# Patient Record
Sex: Female | Born: 1950 | Race: White | Hispanic: No | Marital: Married | Smoking: Former smoker
Health system: Southern US, Community
[De-identification: ages and names within clinical notes are randomized; demographics above are authoritative.]

## PROBLEM LIST (undated history)

## (undated) DIAGNOSIS — H3530 Unspecified macular degeneration: Secondary | ICD-10-CM

## (undated) DIAGNOSIS — N183 Chronic kidney disease, stage 3 unspecified: Secondary | ICD-10-CM

## (undated) DIAGNOSIS — T4145XA Adverse effect of unspecified anesthetic, initial encounter: Secondary | ICD-10-CM

## (undated) DIAGNOSIS — R519 Headache, unspecified: Secondary | ICD-10-CM

## (undated) DIAGNOSIS — R112 Nausea with vomiting, unspecified: Secondary | ICD-10-CM

## (undated) DIAGNOSIS — F32A Depression, unspecified: Secondary | ICD-10-CM

## (undated) DIAGNOSIS — N3281 Overactive bladder: Secondary | ICD-10-CM

## (undated) DIAGNOSIS — F319 Bipolar disorder, unspecified: Secondary | ICD-10-CM

## (undated) DIAGNOSIS — E785 Hyperlipidemia, unspecified: Secondary | ICD-10-CM

## (undated) DIAGNOSIS — I10 Essential (primary) hypertension: Secondary | ICD-10-CM

## (undated) DIAGNOSIS — F419 Anxiety disorder, unspecified: Secondary | ICD-10-CM

## (undated) DIAGNOSIS — Z8719 Personal history of other diseases of the digestive system: Secondary | ICD-10-CM

## (undated) DIAGNOSIS — F329 Major depressive disorder, single episode, unspecified: Secondary | ICD-10-CM

## (undated) DIAGNOSIS — F1910 Other psychoactive substance abuse, uncomplicated: Secondary | ICD-10-CM

## (undated) DIAGNOSIS — K219 Gastro-esophageal reflux disease without esophagitis: Secondary | ICD-10-CM

## (undated) DIAGNOSIS — F4310 Post-traumatic stress disorder, unspecified: Secondary | ICD-10-CM

## (undated) DIAGNOSIS — M1990 Unspecified osteoarthritis, unspecified site: Secondary | ICD-10-CM

## (undated) DIAGNOSIS — T8859XA Other complications of anesthesia, initial encounter: Secondary | ICD-10-CM

## (undated) DIAGNOSIS — Z9889 Other specified postprocedural states: Secondary | ICD-10-CM

## (undated) DIAGNOSIS — R51 Headache: Secondary | ICD-10-CM

## (undated) HISTORY — PX: TONSILLECTOMY AND ADENOIDECTOMY: SUR1326

## (undated) HISTORY — DX: Bipolar disorder, unspecified: F31.9

## (undated) HISTORY — PX: FOOT SURGERY: SHX648

## (undated) HISTORY — DX: Major depressive disorder, single episode, unspecified: F32.9

## (undated) HISTORY — DX: Chronic kidney disease, stage 3 (moderate): N18.3

## (undated) HISTORY — DX: Other psychoactive substance abuse, uncomplicated: F19.10

## (undated) HISTORY — DX: Depression, unspecified: F32.A

## (undated) HISTORY — PX: KNEE SURGERY: SHX244

## (undated) HISTORY — DX: Chronic kidney disease, stage 3 unspecified: N18.30

## (undated) HISTORY — DX: Hyperlipidemia, unspecified: E78.5

## (undated) HISTORY — DX: Overactive bladder: N32.81

## (undated) HISTORY — DX: Anxiety disorder, unspecified: F41.9

## (undated) HISTORY — DX: Unspecified macular degeneration: H35.30

## (undated) HISTORY — DX: Personal history of other diseases of the digestive system: Z87.19

---

## 2007-06-29 ENCOUNTER — Ambulatory Visit: Payer: Self-pay | Admitting: Gastroenterology

## 2011-07-30 ENCOUNTER — Ambulatory Visit: Payer: Self-pay | Admitting: Podiatry

## 2011-09-22 ENCOUNTER — Ambulatory Visit: Payer: Self-pay

## 2012-02-25 ENCOUNTER — Ambulatory Visit: Payer: Self-pay | Admitting: Podiatry

## 2012-08-25 ENCOUNTER — Ambulatory Visit: Payer: Self-pay | Admitting: Family Medicine

## 2012-09-26 ENCOUNTER — Ambulatory Visit: Payer: Self-pay | Admitting: Family Medicine

## 2013-09-27 ENCOUNTER — Ambulatory Visit: Payer: Self-pay | Admitting: Family Medicine

## 2013-10-03 ENCOUNTER — Ambulatory Visit: Payer: Self-pay | Admitting: Family Medicine

## 2013-12-07 ENCOUNTER — Ambulatory Visit: Payer: Self-pay | Admitting: Podiatry

## 2014-09-28 ENCOUNTER — Ambulatory Visit: Payer: Self-pay | Admitting: Family Medicine

## 2014-11-02 IMAGING — MG MM ADDITIONAL VIEWS AT NO CHARGE
2 series · 6 of 6 positions shown · non-contrast
Comparison: Priors

CLINICAL DATA: Patient recalled from screening for possible left
breast architectural distortion

EXAM:
DIGITAL DIAGNOSTIC  LEFT MAMMOGRAM WITH CAD

[L CC · left · 4 of 4 slices shown]
[im 1/4]
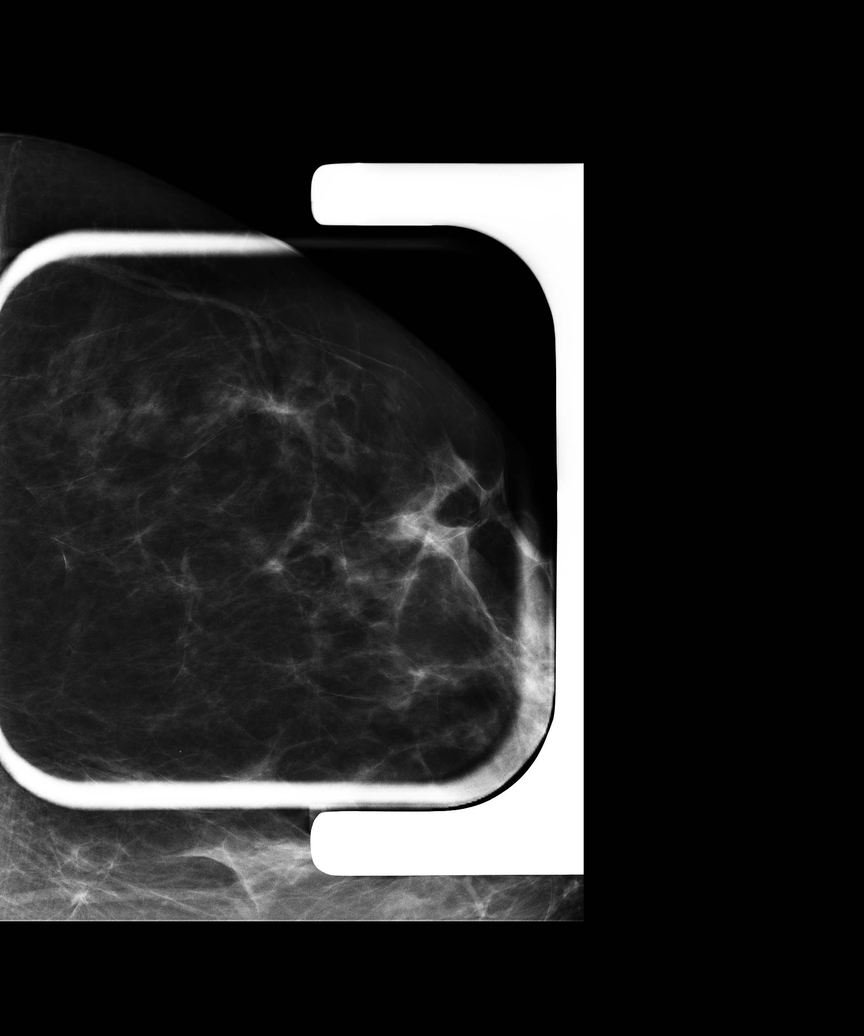
[im 2/4]
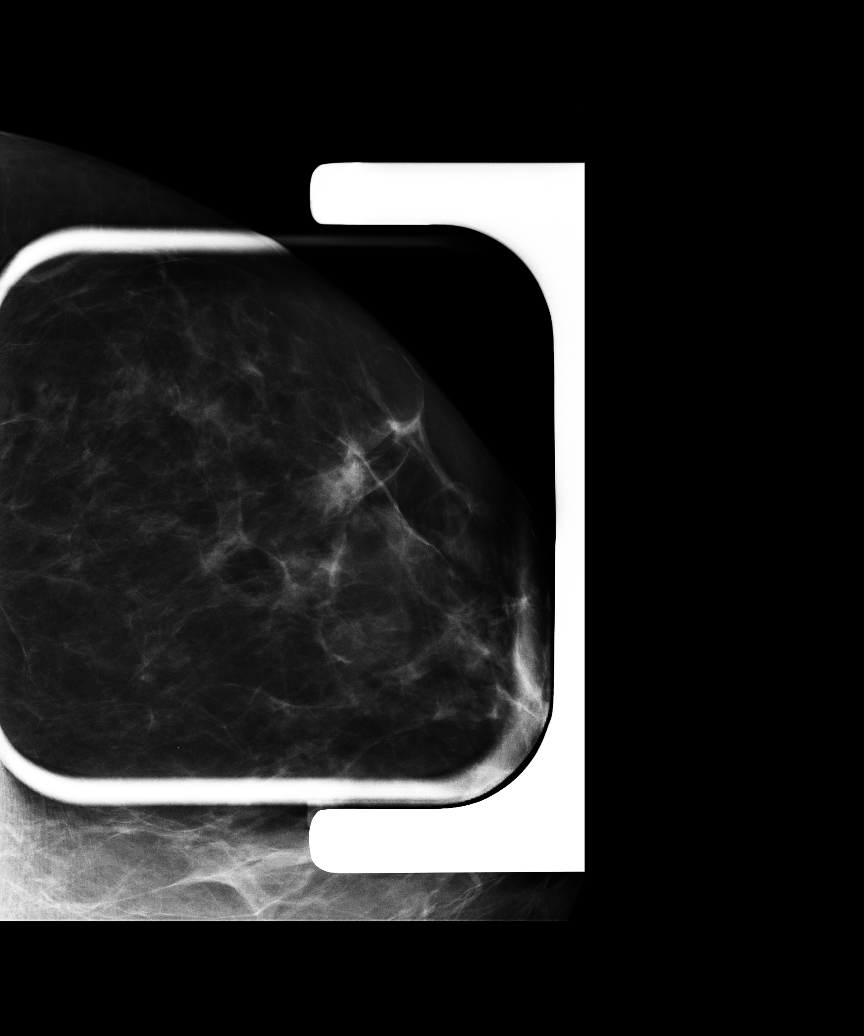
[im 3/4]
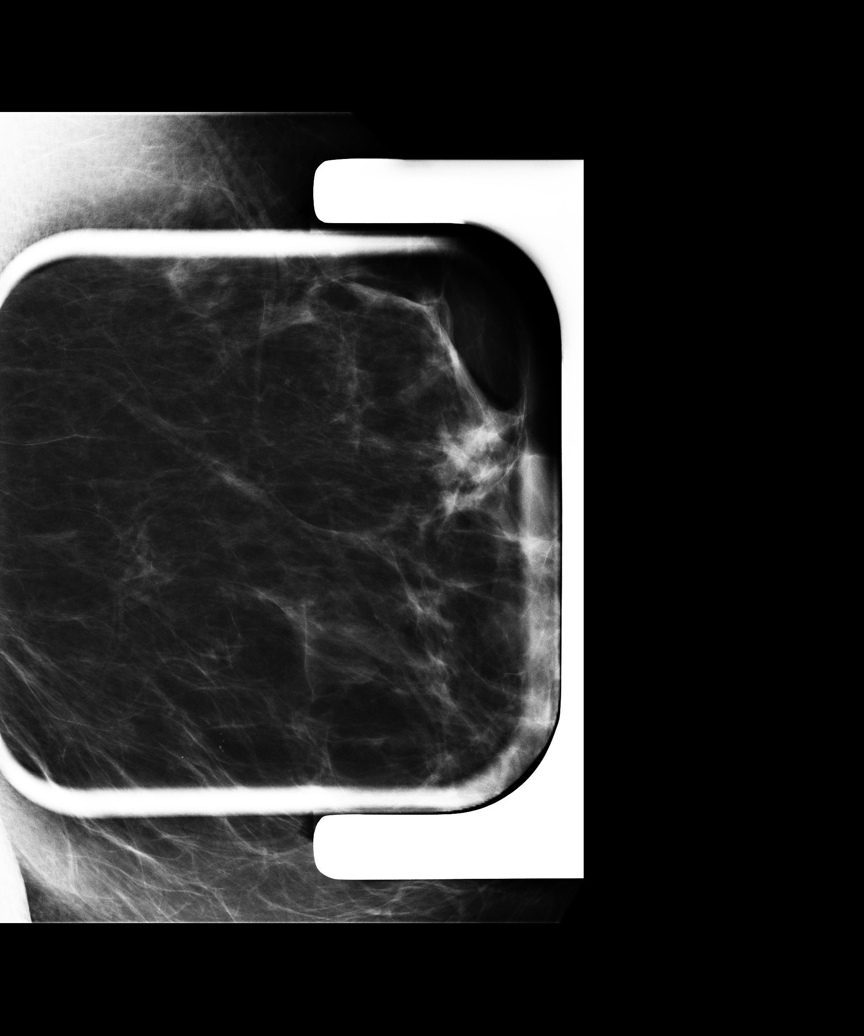
[im 4/4]
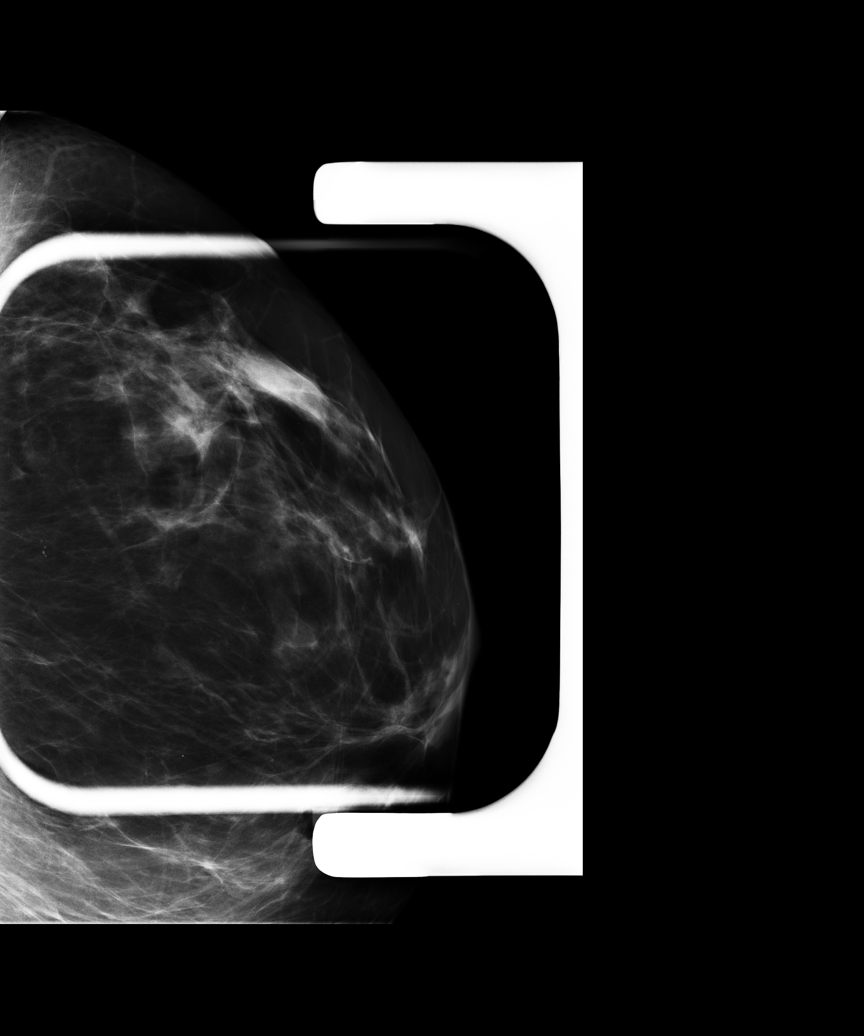

[L MLO · left · 2 of 2 slices shown]
[im 1/2]
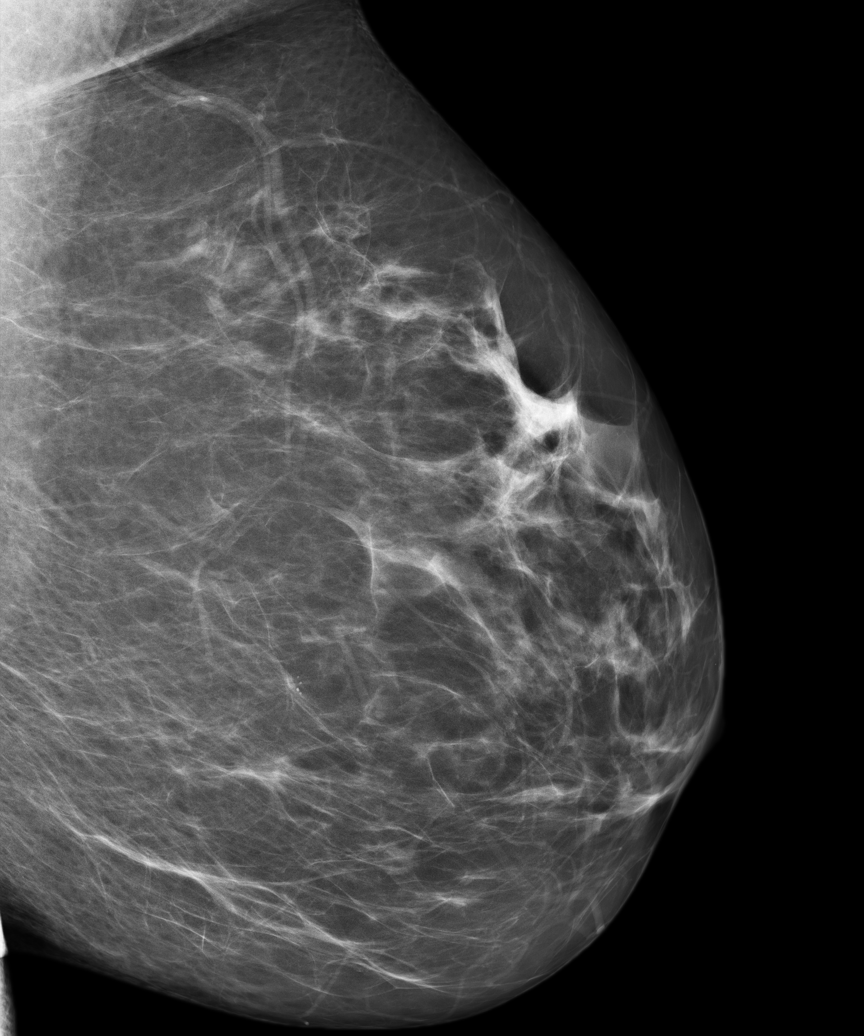
[im 2/2]
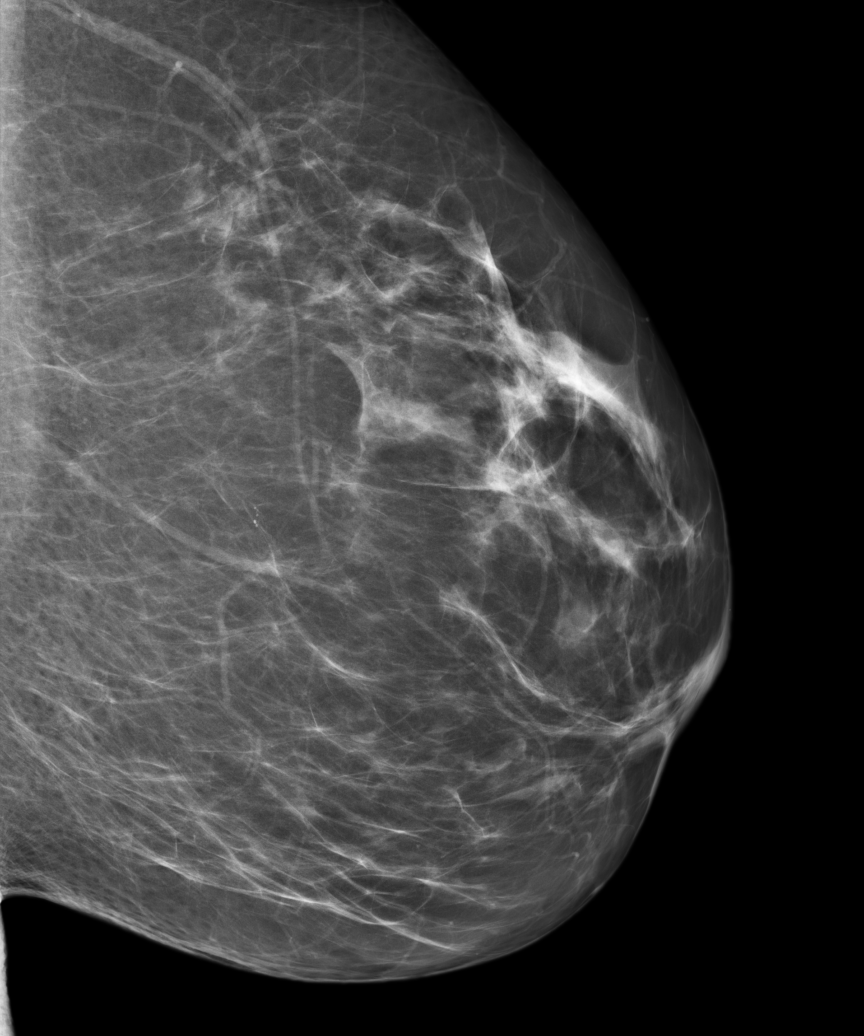

[6 of 6 positions shown; findings below may reference images not displayed]

ACR Breast Density Category c: The breast tissue is heterogeneously
dense, which may obscure small masses.
FINDINGS: Repeat left MLO and true lateral views as well as spot compression
left breast CC and MLO views were obtained. Questioned architectural
distortion resolved with additional imaging compatible with
overlapping fibroglandular breast tissue.

Mammographic images were processed with CAD.
IMPRESSION: No mammographic evidence for malignancy

RECOMMENDATION:
Screening mammogram in one year.(Code:[id number])

I have discussed the findings and recommendations with the patient.
Results were also provided in writing at the conclusion of the
visit. If applicable, a reminder letter will be sent to the patient
regarding the next appointment.

BI-RADS CATEGORY  1: Negative.

## 2015-02-13 ENCOUNTER — Other Ambulatory Visit: Payer: Self-pay | Admitting: Family Medicine

## 2015-02-13 ENCOUNTER — Other Ambulatory Visit: Payer: Self-pay

## 2015-02-13 DIAGNOSIS — R944 Abnormal results of kidney function studies: Secondary | ICD-10-CM

## 2015-02-13 LAB — BASIC METABOLIC PANEL
BUN: 13 mg/dL (ref 4–21)
CREATININE: 1 mg/dL (ref 0.5–1.1)
GLUCOSE: 108 mg/dL
Potassium: 4.8 mmol/L (ref 3.4–5.3)
Sodium: 139 mmol/L (ref 137–147)

## 2015-02-14 ENCOUNTER — Telehealth: Payer: Self-pay | Admitting: Family Medicine

## 2015-02-14 ENCOUNTER — Encounter: Payer: Self-pay | Admitting: Family Medicine

## 2015-02-14 DIAGNOSIS — N183 Chronic kidney disease, stage 3 unspecified: Secondary | ICD-10-CM

## 2015-02-14 NOTE — Telephone Encounter (Signed)
Please let patient know that her kidney functions improved, but still are not normal. Given her decreased kidney function, we are going to refer her to the kidney doctors. No new medicines at this time, we will just watch, keep her BP under good control and have her see the kidney doctor.

## 2015-02-14 NOTE — Telephone Encounter (Signed)
I called the patient, no answer will try again.

## 2015-02-15 NOTE — Telephone Encounter (Signed)
Forwarded to [facility]

## 2015-02-15 NOTE — Telephone Encounter (Signed)
Call pt to review

## 2015-02-15 NOTE — Telephone Encounter (Signed)
Discussed will keep nephrology apt and reassured pt

## 2015-02-15 NOTE — Telephone Encounter (Signed)
Patient notified of message from Dr. Wynetta Emery. Patient would like to know what Dr. Jeananne Rama thinks of the results, and what he thinks that she should do. Will forward this to [place] for [facility].

## 2015-03-21 ENCOUNTER — Telehealth: Payer: Self-pay | Admitting: Family Medicine

## 2015-03-21 NOTE — Telephone Encounter (Signed)
Pt came in and brought a from that needs a signature saying she can go on a hike July 13th and says she normally sees dr Jeananne Rama but has also seen dr Wynetta Emery. i was unsure who needed to sign the paper but i have it up front.

## 2015-03-21 NOTE — Telephone Encounter (Signed)
OK to hike. Form filled out. To be scanned in.

## 2015-03-21 NOTE — Telephone Encounter (Signed)
Last CPE: 08/02/14 Last Visit: 02/01/15 Problem List: osteopenia  DIVERTICULOSIS  HYPERTENSION  HYPERLIPIDEMIA  BIPOLAR    alcoholic  DEPRESSION Thyroid Issues Anxiety Overactive bladder  Current Medications:  [facility]  Rx: ASPIRIN daily  Rx: CALCIUM daily  Rx: FISH OIL daily  Rx: GABAPENTIN 300mg  1 Tablet daily  Rx: MIRTAZAPINE 45MG  1 Tablet at bedtime  Rx: ATORVASTATIN CALCIUM 20MG  1 Tablet daily  Rx: BENAZEPRIL HCL 40MG  1 Tablet daily  Rx: HYDROXYZ PAM 25mg  1 Capsule twice daily  Rx: SEROQUEL 400mg  1 Tablet daily  Rx: BUSPIRONE HCL 15MG  Tablet      Instructions: TAKE 1 TABLET 3 TIMES A DAY Rx: TOPAMAX 1 Tablet daily

## 2015-06-13 ENCOUNTER — Telehealth: Payer: Self-pay | Admitting: Family Medicine

## 2015-06-13 MED ORDER — CLONAZEPAM 1 MG PO TABS: 1.0000 mg | ORAL_TABLET | Freq: Every day | ORAL | Status: DC | PRN

## 2015-06-13 NOTE — Telephone Encounter (Signed)
Forward to provider

## 2015-06-13 NOTE — Telephone Encounter (Signed)
Pt called stated she is stressed to the max, has trees on her house, has to stay in a hotel with several women. Would like a RX for Klonopin or something similar. [facility] [place]. Pt stated she just needs enough to get her through (30 day supply) requested. Pt has A LOT going on, didn't think she was going to survive the past week then the trees fell on her house. Thanks.

## 2015-07-09 ENCOUNTER — Ambulatory Visit
Admission: RE | Admit: 2015-07-09 | Discharge: 2015-07-09 | Disposition: A | Discharge status: Home / Self Care | Payer: BC Managed Care – PPO | Source: Ambulatory Visit | Attending: Psychiatry | Admitting: Psychiatry

## 2015-07-09 ENCOUNTER — Ambulatory Visit (INDEPENDENT_AMBULATORY_CARE_PROVIDER_SITE_OTHER): Payer: BC Managed Care – PPO | Admitting: Psychiatry

## 2015-07-09 ENCOUNTER — Encounter
Admission: RE | Admit: 2015-07-09 | Discharge: 2015-07-09 | Disposition: A | Discharge status: Home / Self Care | Payer: BC Managed Care – PPO | Source: Ambulatory Visit | Attending: Psychiatry | Admitting: Psychiatry

## 2015-07-09 ENCOUNTER — Encounter: Payer: Self-pay | Admitting: Psychiatry

## 2015-07-09 VITALS — BP 126/88 | HR 78 | Temp 97.8°F | Ht 66.0 in | Wt 182.8 lb

## 2015-07-09 DIAGNOSIS — F3130 Bipolar disorder, current episode depressed, mild or moderate severity, unspecified: Secondary | ICD-10-CM | POA: Diagnosis not present

## 2015-07-09 DIAGNOSIS — Z01810 Encounter for preprocedural cardiovascular examination: Secondary | ICD-10-CM | POA: Diagnosis not present

## 2015-07-09 DIAGNOSIS — I10 Essential (primary) hypertension: Secondary | ICD-10-CM | POA: Diagnosis not present

## 2015-07-09 DIAGNOSIS — F319 Bipolar disorder, unspecified: Secondary | ICD-10-CM | POA: Insufficient documentation

## 2015-07-09 DIAGNOSIS — Z419 Encounter for procedure for purposes other than remedying health state, unspecified: Secondary | ICD-10-CM

## 2015-07-09 HISTORY — DX: Adverse effect of unspecified anesthetic, initial encounter: T41.45XA

## 2015-07-09 HISTORY — DX: Other specified postprocedural states: Z98.890

## 2015-07-09 HISTORY — DX: Nausea with vomiting, unspecified: R11.2

## 2015-07-09 HISTORY — DX: Other complications of anesthesia, initial encounter: T88.59XA

## 2015-07-09 HISTORY — DX: Essential (primary) hypertension: I10

## 2015-07-09 LAB — URINALYSIS COMPLETE WITH MICROSCOPIC (ARMC ONLY)
Bilirubin Urine: NEGATIVE
GLUCOSE, UA: NEGATIVE mg/dL
KETONES UR: NEGATIVE mg/dL
NITRITE: NEGATIVE
Protein, ur: NEGATIVE mg/dL
SPECIFIC GRAVITY, URINE: 1.004 — AB (ref 1.005–1.030)
pH: 7 (ref 5.0–8.0)

## 2015-07-09 LAB — CBC
HCT: 44.8 % (ref 35.0–47.0)
Hemoglobin: 14.8 g/dL (ref 12.0–16.0)
MCH: 30.5 pg (ref 26.0–34.0)
MCHC: 33.1 g/dL (ref 32.0–36.0)
MCV: 92.2 fL (ref 80.0–100.0)
PLATELETS: 186 10*3/uL (ref 150–440)
RBC: 4.85 MIL/uL (ref 3.80–5.20)
RDW: 12.9 % (ref 11.5–14.5)
WBC: 8.5 10*3/uL (ref 3.6–11.0)

## 2015-07-09 LAB — COMPREHENSIVE METABOLIC PANEL
ALT: 18 U/L (ref 14–54)
ANION GAP: 8 (ref 5–15)
AST: 20 U/L (ref 15–41)
Albumin: 4.5 g/dL (ref 3.5–5.0)
Alkaline Phosphatase: 74 U/L (ref 38–126)
BUN: 15 mg/dL (ref 6–20)
CALCIUM: 10.2 mg/dL (ref 8.9–10.3)
CHLORIDE: 103 mmol/L (ref 101–111)
CO2: 29 mmol/L (ref 22–32)
Creatinine, Ser: 0.92 mg/dL (ref 0.44–1.00)
Glucose, Bld: 93 mg/dL (ref 65–99)
Potassium: 4.7 mmol/L (ref 3.5–5.1)
SODIUM: 140 mmol/L (ref 135–145)
Total Bilirubin: 0.8 mg/dL (ref 0.3–1.2)
Total Protein: 7.6 g/dL (ref 6.5–8.1)

## 2015-07-09 NOTE — Progress Notes (Signed)
[facility] MD Progress Note  07/09/2015 8:14 PM ELISHEBA MCDONNELL  MRN:  [id number] Subjective:  This is a consult for a patient for consideration of ECT.  64 year old woman referred by her outpatient psychiatrist. Information was obtained from direct interview with the patient as well as review of some referral note sent over by Dr. Vallarie Mare. Reports that she has a history of bipolar disorder primarily with depression. Most recent symptoms started over the summer. She states that she had a period of time in which she was feeling slightly euphoric and Dr. Vallarie Mare asked her to start cutting down on her Remeron dose. Patient soon after became extremely depressed in August and it has just gotten worse. Mood is down and depressed all the time. Frequent crying spells. Lack of enjoyment in anything. Waking up frequently at night. Appetite has been increased. Patient has been having suicide thoughts and told her psychiatrist about them. Guns were removed from the house and locked up. She is still had some thoughts about cutting herself but has not acted on it. She feels restrained in this by her love of her pets in her family. Agent is not reporting any hallucinations or psychotic symptoms. She is currently being treated with a combination of Seroquel, buspirone, mirtazapine and has recently been started on lithium and Depakote by her primary psychiatrist and takes a low dose of clonazepam. Her outpatient psychiatrist has referred her to consider ECT. Patient denies that she is abusing any alcohol or drugs currently.  Past psychiatric history: Patient has a history of long-standing bipolar disorder. Nevertheless she apparently has never had a psychotic mania. She does report some behavior in the late summer in which she may have had sex with some strangers while intoxicated. She feels particularly guilty about this now. She has had a history of suicide attempts in the 1990s with a threatened hanging and self cutting. Multiple  medications have been tried.  Social history: Patient is married. Lives with her husband. Has 2 adult children. She tries to stay busy with volunteer work and work around [employer] but has had trouble with that recently.  Family history: She reports several suicides or suicide attempts in first or second degree relatives. Fairly widespread depression and suicidality. Denies that she has any history of substance abuse.  Patient denies history of drug abuse although she says she drinks about 3 times a week she denies that has been a problem for her.  Medical history: History of high blood pressure. She has had multiple surgeries for minor orthopedic problems and says she had trouble with agitation on one occasion but no other complications from anesthesia. No history of myocardial infarction or stroke. Principal Problem: @PPROB @ Diagnosis:   Patient Active Problem List   Diagnosis Date Noted  . CKD (chronic kidney disease) stage 3, GFR 30-59 ml/min [N18.3]    Total Time spent with patient: 1 hour  Past Psychiatric History: Long-standing diagnosis of bipolar disorder with mostly severe depression although some nonpsychotic manias. Previous hospitalization several years ago. Multiple medications have been tried she states that Seroquel had been helped for a long period of time. Antidepressants themselves rarely had been helpful over the long-term.  Past Medical History:  Past Medical History  Diagnosis Date  . CKD (chronic kidney disease) stage 3, GFR 30-59 ml/min   . Anxiety   . Depression   . Bipolar disorder ([place])   . Thyroid disease     Past Surgical History  Procedure Laterality Date  . Foot  surgery Bilateral   . Knee surgery Left   . Tonsillectomy and adenoidectomy     Family History:  Family History  Problem Relation Age of Onset  . Alzheimer's disease Mother   . Bipolar disorder Mother   . Depression Mother   . Heart attack Father   . Diabetes Brother   . Obesity Brother    . Depression Maternal Grandfather   . Depression Daughter    Family Psychiatric  History: Family history positive for multiple people with depression and a few people who have committed suicide. Social History:  History  Alcohol Use  . 1.8 oz/week  . 0 Standard drinks or equivalent, 0 Glasses of wine, 0 Cans of beer, 3 Shots of liquor per week     History  Drug Use No    Social History   Social History  . Marital Status: Married    Spouse Name: N/A  . Number of Children: N/A  . Years of Education: N/A   Social History Main Topics  . Smoking status: Former Smoker    Types: Cigarettes    Start date: 07/09/1967    Quit date: 07/08/1985  . Smokeless tobacco: Never Used  . Alcohol Use: 1.8 oz/week    0 Standard drinks or equivalent, 0 Glasses of wine, 0 Cans of beer, 3 Shots of liquor per week  . Drug Use: No  . Sexual Activity: Not Currently   Other Topics Concern  . None   Social History Narrative   Additional Social History:                         Sleep: Poor  Appetite:  Good  Current Medications: Current Outpatient Prescriptions  Medication Sig Dispense Refill  . aspirin EC 81 MG tablet Take by mouth.    . benazepril (LOTENSIN) 40 MG tablet Take by mouth.    . benazepril (LOTENSIN) 5 MG tablet Take 5 mg by mouth daily.  3  . busPIRone (BUSPAR) 15 MG tablet Take by mouth.    . clonazePAM (KLONOPIN) 1 MG tablet Take 1 tablet (1 mg total) by mouth daily as needed for anxiety. 30 tablet 0  . divalproex (DEPAKOTE) 500 MG DR tablet TAKE ONE TABLET BY MOUTH AT BEDTIME X 2 DAYS, THEN 1 TABLET BY MOUTH TWICE A DAY  0  . FLUARIX QUADRIVALENT 0.5 ML injection TO BE ADMINISTERED BY PHARMACIST FOR IMMUNIZATION  0  . gabapentin (NEURONTIN) 100 MG capsule     . Influenza Vac Typ A&B Surf Ant (FLUVIRIN) SUSP     . lithium 300 MG tablet Take 300 mg by mouth 2 (two) times daily.  1  . mirtazapine (REMERON) 30 MG tablet Take 30 mg by mouth at bedtime.  3  . Omega-3  Fatty Acids (FISH OIL) 1000 MG CAPS Take by mouth.    . QUEtiapine (SEROQUEL) 100 MG tablet Take 100 mg by mouth at bedtime.  0  . QUEtiapine (SEROQUEL) 300 MG tablet Take by mouth.     No current facility-administered medications for this visit.    Lab Results: No results found for this or any previous visit (from the past 48 hour(s)).  Physical Findings: AIMS:  , ,  ,  ,    CIWA:    COWS:     Musculoskeletal: Strength & Muscle Tone: within normal limits Gait & Station: normal Patient leans: N/A  Psychiatric Specialty Exam: ROS  Blood pressure 126/88, pulse 78, temperature 97.8 F (  36.6 C), temperature source Tympanic, height 5\' 6"  (1.676 m), weight 182 lb 12.8 oz (82.918 kg), SpO2 96 %.Body mass index is 29.52 kg/(m^2).  General Appearance: Casual  Eye Contact::  Good  Speech:  Normal Rate  Volume:  Decreased  Mood:  Depressed  Affect:  Depressed and Tearful  Thought Process:  Coherent  Orientation:  Full (Time, Place, and Person)  Thought Content:  Negative  Suicidal Thoughts:  Yes.  without intent/plan  Homicidal Thoughts:  No  Memory:  Immediate;   Good Recent;   Good Remote;   Good  Judgement:  Fair  Insight:  Good  Psychomotor Activity:  Decreased  Concentration:  Fair  Recall:  [employer] of Knowledge:Fair  Language: Fair  Akathisia:  No  Handed:  Right  AIMS (if indicated):     Assets:  Communication Skills Desire for Improvement Financial Resources/Insurance Housing Intimacy Leisure Time Physical Health Resilience Social Support Talents/Skills Transportation  ADL's:  Intact  Cognition: WNL  Sleep:      Treatment Plan Summary: Plan This is a 64 year old woman with history of bipolar disorder probably more likely bipolar type II who is currently presenting with symptoms of severe 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 She is having suicidal ideation and becoming more and more on functional in unstable at home. She has been on multiple  medications and is not currently responding. Her outpatient psychiatrist is additionally concerned because he is planning to close his practice soon and is worried about trying to get her well as soon as possible. Patient meets criteria for electroconvulsive therapy based on her diagnosis, failure to respond to appropriate medication for her depression and bipolar disorder and willingness to engage in treatment. She has no medical contraindications to treatment. Patient reports that she has appropriate transportation available.We spent some time discussing the practical aspects of ECT treatment. Patient was given opportunity to ask as many questions that she wanted. She is agreeable to ECT. I would recommend beginning right leg unilateral ECT as soon as we can get lab studies done and get her on the schedule. I have advised her that she needs to stop taking her valproic acid and might as well go ahead and do that now. I also suggested she should probably cut down on her gabapentin. I am on the fence about her lithium. I think that may increase the risk of delirium with ECT but it also seems to be of some likelihood to help stabilizing her suicidality. All things considered I'm advising her to leave it as it is.Patient was given an order form for pre-surgical testing. Labs including CBC chemistry panel urine analysis chest x-ray and EKG done. I have left a message for the ECT office to expect her call. Patient know she can get in touch with me at any time if she has other questions. I will leave a message for her outpatient psychiatrist as well.  Dorris Vangorder 07/09/2015, 8:14 PM

## 2015-07-11 NOTE — OR Nursing (Signed)
Abnormal ua called and faxed to Dr Weber Cooks office

## 2015-07-11 NOTE — Patient Instructions (Signed)
  Your procedure is scheduled on: Report to Day Surgery. To find out your arrival time please call [phone] between 1PM - 3PM on .  Remember: Instructions that are not followed completely may result in serious medical risk, up to and including death, or upon the discretion of your surgeon and anesthesiologist your surgery may need to be rescheduled.    x___ 1. Do not eat food or drink liquids after midnight. No gum chewing or hard candies.     _x___ 2. No Alcohol for 24 hours before or after surgery.   ____ 3. Bring all medications with you on the day of surgery if instructed.    __x__ 4. Notify your doctor if there is any change in your medical condition     (cold, fever, infections).     Do not wear jewelry, make-up, hairpins, clips or nail polish.  Do not wear lotions, powders, or perfumes. You may wear deodorant.  Do not shave 48 hours prior to surgery. Men may shave face and neck.  Do not bring valuables to the hospital.    [facility] is not responsible for any belongings or valuables.               Contacts, dentures or bridgework may not be worn into surgery.  Leave your suitcase in the car. After surgery it may be brought to your room.  For patients admitted to the hospital, discharge time is determined by your                treatment team.   Patients discharged the day of surgery will not be allowed to drive home.   Please read over the following fact sheets that you were given:   Surgical Site Infection Prevention   ____ Take these medicines the morning of surgery with A SIP OF WATER:    1. Take as instructed by Dr Algie Coffer  2.   3.   4.  5.  6.  ____ Fleet Enema (as directed)   ____ Use CHG Soap as directed  ____ Use inhalers on the day of surgery  ____ Stop metformin 2 days prior to surgery    ____ Take 1/2 of usual insulin dose the night before surgery and none on the morning of surgery.   ____ Stop Coumadin/Plavix/aspirin on   ____ Stop  Anti-inflammatories on    ____ Stop supplements until after surgery.    ____ Bring C-Pap to the hospital.

## 2015-07-12 ENCOUNTER — Telehealth: Payer: Self-pay | Admitting: Psychiatry

## 2015-07-12 ENCOUNTER — Telehealth (HOSPITAL_COMMUNITY): Payer: Self-pay | Admitting: *Deleted

## 2015-07-12 NOTE — Telephone Encounter (Signed)
Was going to do prior authorization for ECT treatments but noticed a note dated 10-28 that patient called and wants to hold off starting ECT at this time. Will consult with MD on when to have authorization with insurance complete.

## 2015-07-12 NOTE — Telephone Encounter (Signed)
I don't know, I had not seen the note saying she was holding off. Last I heard she was planning to do it and she did get all of her labs done. I'll look at the chart if it looks like she is holding off then we certainly don't need to bother with the prior authorization.

## 2015-07-12 NOTE — Telephone Encounter (Signed)
Okay. If she doesn't want to follow-up right now that's fine. I will let Allie no we don't need to do the prior authorization either.

## 2015-07-22 ENCOUNTER — Ambulatory Visit: Payer: Self-pay | Admitting: Psychiatry

## 2015-08-05 ENCOUNTER — Encounter: Payer: Self-pay | Admitting: Family Medicine

## 2015-08-05 ENCOUNTER — Ambulatory Visit (INDEPENDENT_AMBULATORY_CARE_PROVIDER_SITE_OTHER): Payer: BC Managed Care – PPO | Admitting: Family Medicine

## 2015-08-05 VITALS — BP 121/82 | HR 78 | Temp 98.6°F | Ht 65.2 in | Wt 182.0 lb

## 2015-08-05 DIAGNOSIS — E7800 Pure hypercholesterolemia, unspecified: Secondary | ICD-10-CM | POA: Insufficient documentation

## 2015-08-05 DIAGNOSIS — E039 Hypothyroidism, unspecified: Secondary | ICD-10-CM | POA: Diagnosis not present

## 2015-08-05 DIAGNOSIS — I10 Essential (primary) hypertension: Secondary | ICD-10-CM

## 2015-08-05 DIAGNOSIS — F319 Bipolar disorder, unspecified: Secondary | ICD-10-CM | POA: Diagnosis not present

## 2015-08-05 MED ORDER — ATORVASTATIN CALCIUM 20 MG PO TABS: 20.0000 mg | ORAL_TABLET | Freq: Every day | ORAL | Status: DC

## 2015-08-05 MED ORDER — BUSPIRONE HCL 15 MG PO TABS: 15.0000 mg | ORAL_TABLET | Freq: Three times a day (TID) | ORAL | Status: DC

## 2015-08-05 MED ORDER — BENAZEPRIL HCL 5 MG PO TABS: 5.0000 mg | ORAL_TABLET | Freq: Every day | ORAL | Status: DC

## 2015-08-05 NOTE — Progress Notes (Signed)
BP 121/82 mmHg  Pulse 78  Temp(Src) 98.6 F (37 C)  Ht 5' 5.2" (1.656 m)  Wt 182 lb (82.555 kg)  BMI 30.10 kg/m2  SpO2 99%   Subjective:    Patient ID: Elizabeth Malone, female    DOB: Jul 15, 1951, 64 y.o.   MRN: [id number]  HPI: Elizabeth Malone is a 64 y.o. female  Chief Complaint  Patient presents with  . Annual Exam   Patient doing well working with psychiatry on chronic depression bipolar. Requesting routine labs plus lithium level Otherwise patient doing well concerned about possibility of some enlarged glands in her neck Patient is lost serious weight and kidney doctor reduced into Prill from 40 mg to 5 mg and blood pressure continues to do well. Relevant past medical, surgical, family and social history reviewed and updated as indicated. Interim medical history since our last visit reviewed. Allergies and medications reviewed and updated.  Review of Systems  Constitutional: Negative.   HENT: Negative.   Eyes: Negative.   Respiratory: Negative.   Cardiovascular: Negative.   Gastrointestinal: Negative.   Endocrine: Negative.   Genitourinary: Negative.   Musculoskeletal: Negative.   Skin: Negative.   Allergic/Immunologic: Negative.   Neurological: Negative.   Hematological: Negative.   Psychiatric/Behavioral: Negative.     Per HPI unless specifically indicated above     Objective:    BP 121/82 mmHg  Pulse 78  Temp(Src) 98.6 F (37 C)  Ht 5' 5.2" (1.656 m)  Wt 182 lb (82.555 kg)  BMI 30.10 kg/m2  SpO2 99%  Wt Readings from Last 3 Encounters:  08/05/15 182 lb (82.555 kg)  02/01/15 207 lb (93.895 kg)  07/09/15 182 lb 12.8 oz (82.918 kg)    Physical Exam  Constitutional: She is oriented to person, place, and time. She appears well-developed and well-nourished.  HENT:  Head: Normocephalic and atraumatic.  Right Ear: External ear normal.  Left Ear: External ear normal.  Nose: Nose normal.  Mouth/Throat: Oropharynx is clear and moist.  Eyes: Conjunctivae  and EOM are normal. Pupils are equal, round, and reactive to light.  Neck: Normal range of motion. Neck supple. Carotid bruit is not present.  Cardiovascular: Normal rate, regular rhythm and normal heart sounds.   No murmur heard. Pulmonary/Chest: Effort normal and breath sounds normal. She exhibits no mass. Right breast exhibits no mass, no skin change and no tenderness. Left breast exhibits no mass, no skin change and no tenderness. Breasts are symmetrical.  Abdominal: Soft. Bowel sounds are normal. There is no hepatosplenomegaly.  Musculoskeletal: Normal range of motion.  Neurological: She is alert and oriented to person, place, and time.  Skin: No rash noted.  Psychiatric: She has a normal mood and affect. Her behavior is normal. Judgment and thought content normal.    Results for orders placed or performed during the hospital encounter of 07/09/15  CBC  Result Value Ref Range   WBC 8.5 3.6 - 11.0 K/uL   RBC 4.85 3.80 - 5.20 MIL/uL   Hemoglobin 14.8 12.0 - 16.0 g/dL   HCT 44.8 35.0 - 47.0 %   MCV 92.2 80.0 - 100.0 fL   MCH 30.5 26.0 - 34.0 pg   MCHC 33.1 32.0 - 36.0 g/dL   RDW 12.9 11.5 - 14.5 %   Platelets 186 150 - 440 K/uL  Comprehensive metabolic panel  Result Value Ref Range   Sodium 140 135 - 145 mmol/L   Potassium 4.7 3.5 - 5.1 mmol/L   Chloride 103 101 -  111 mmol/L   CO2 29 22 - 32 mmol/L   Glucose, Bld 93 65 - 99 mg/dL   BUN 15 6 - 20 mg/dL   Creatinine, Ser 0.92 0.44 - 1.00 mg/dL   Calcium 10.2 8.9 - 10.3 mg/dL   Total Protein 7.6 6.5 - 8.1 g/dL   Albumin 4.5 3.5 - 5.0 g/dL   AST 20 15 - 41 U/L   ALT 18 14 - 54 U/L   Alkaline Phosphatase 74 38 - 126 U/L   Total Bilirubin 0.8 0.3 - 1.2 mg/dL   GFR calc non Af Amer >60 >60 mL/min   GFR calc Af Amer >60 >60 mL/min   Anion gap 8 5 - 15  Urinalysis complete, with microscopic ([facility] only)  Result Value Ref Range   Color, Urine STRAW (A) YELLOW   APPearance HAZY (A) CLEAR   Glucose, UA NEGATIVE NEGATIVE mg/dL    Bilirubin Urine NEGATIVE NEGATIVE   Ketones, ur NEGATIVE NEGATIVE mg/dL   Specific Gravity, Urine 1.004 (L) 1.005 - 1.030   Hgb urine dipstick 1+ (A) NEGATIVE   pH 7.0 5.0 - 8.0   Protein, ur NEGATIVE NEGATIVE mg/dL   Nitrite NEGATIVE NEGATIVE   Leukocytes, UA 3+ (A) NEGATIVE   RBC / HPF 0-5 0 - 5 RBC/hpf   WBC, UA TOO NUMEROUS TO COUNT 0 - 5 WBC/hpf   Bacteria, UA FEW (A) NONE SEEN   Squamous Epithelial / LPF 0-5 (A) NONE SEEN   WBC Clumps PRESENT       Assessment & Plan:   Problem List Items Addressed This Visit      Cardiovascular and Mediastinum   Essential hypertension    The current medical regimen is effective;  continue present plan and medications.       Relevant Medications   benazepril (LOTENSIN) 5 MG tablet   atorvastatin (LIPITOR) 20 MG tablet     Endocrine   Hypothyroidism    No meds         Other   Hypercholesteremia    The current medical regimen is effective;  continue present plan and medications.       Relevant Medications   benazepril (LOTENSIN) 5 MG tablet   atorvastatin (LIPITOR) 20 MG tablet   Bipolar disorder (H[facility]) - Primary    Followed by psychiatry          Follow up plan: Return in about 6 months (around 02/02/2016) for BMP, lipid panel, ALT, AST.

## 2015-08-05 NOTE — Assessment & Plan Note (Signed)
The current medical regimen is effective;  continue present plan and medications.  

## 2015-08-05 NOTE — Assessment & Plan Note (Addendum)
No meds

## 2015-08-05 NOTE — Assessment & Plan Note (Signed)
Followed by psychiatry 

## 2015-08-06 ENCOUNTER — Encounter: Payer: Self-pay | Admitting: Family Medicine

## 2015-08-06 ENCOUNTER — Telehealth: Payer: Self-pay | Admitting: Family Medicine

## 2015-08-06 LAB — TSH: TSH: 2.16 u[IU]/mL (ref 0.450–4.500)

## 2015-08-06 LAB — LITHIUM LEVEL: LITHIUM LVL: 1.1 mmol/L (ref 0.6–1.4)

## 2015-08-06 MED ORDER — METOPROLOL SUCCINATE ER 25 MG PO TB24: 25.0000 mg | ORAL_TABLET | Freq: Every day | ORAL | Status: DC

## 2015-08-06 NOTE — Telephone Encounter (Signed)
Pt stated Dr Geryl Councilman is going to stop practicing and he would like to have pt start a beta blocker to reduce headaches. Send to [facility]

## 2015-08-07 ENCOUNTER — Telehealth: Payer: Self-pay

## 2015-08-07 MED ORDER — METOPROLOL SUCCINATE ER 25 MG PO TB24: 25.0000 mg | ORAL_TABLET | Freq: Every day | ORAL | Status: DC

## 2015-08-07 NOTE — Telephone Encounter (Signed)
[facility] requesting 90 day Rx for Metoprolol ER TAB 25mg 

## 2015-08-12 ENCOUNTER — Encounter: Payer: Self-pay | Admitting: Psychiatry

## 2015-08-12 ENCOUNTER — Ambulatory Visit (INDEPENDENT_AMBULATORY_CARE_PROVIDER_SITE_OTHER): Payer: BC Managed Care – PPO | Admitting: Psychiatry

## 2015-08-12 ENCOUNTER — Telehealth: Payer: Self-pay

## 2015-08-12 VITALS — BP 124/80 | HR 80 | Temp 98.4°F | Ht 65.2 in | Wt 188.4 lb

## 2015-08-12 DIAGNOSIS — F3130 Bipolar disorder, current episode depressed, mild or moderate severity, unspecified: Secondary | ICD-10-CM

## 2015-08-12 MED ORDER — LITHIUM CARBONATE 300 MG PO TABS: 300.0000 mg | ORAL_TABLET | Freq: Three times a day (TID) | ORAL | Status: DC

## 2015-08-12 MED ORDER — QUETIAPINE FUMARATE 100 MG PO TABS: 100.0000 mg | ORAL_TABLET | Freq: Every day | ORAL | Status: DC

## 2015-08-12 MED ORDER — MIRTAZAPINE 30 MG PO TABS: 30.0000 mg | ORAL_TABLET | Freq: Every day | ORAL | Status: DC

## 2015-08-12 MED ORDER — QUETIAPINE FUMARATE 100 MG PO TABS: 400.0000 mg | ORAL_TABLET | Freq: Every day | ORAL | Status: DC

## 2015-08-12 MED ORDER — BUSPIRONE HCL 15 MG PO TABS: 15.0000 mg | ORAL_TABLET | Freq: Three times a day (TID) | ORAL | Status: DC

## 2015-08-12 NOTE — Telephone Encounter (Signed)
Elizabeth Malone at [facility] called ref # [id number] [phone]) called because they received two different rx for seroquel and they need to be clear on what you want the patient to take.  both rx for seroquel 100mg  but one has take one daily  and then the other one is for seroquel 100mg  take 4 but it only for 90 table not enough.  If you were going to do a total of 500mg  you can use a 300mg  and a 200mg  to equal 500mg .  Please call them and clear up rx instructions.  Use the phone number  [phone] and ref#  [id number].

## 2015-08-12 NOTE — Progress Notes (Signed)
[facility] MD Progress Note  08/12/2015 1:11 PM Elizabeth Malone  MRN:  [id number] Subjective:    64 year old woman seen  for follow-up. She was previously evaluated by Dr. Weber Cooks for ECT but patient reported that she has improved since her medications have been adjusted. Patient reported that she is currently doing well on her medications. She appeared somewhat hyper during the interview and reported that she went shopping yesterday and spent almost $100 on clothing. She reported that she enjoys shopping. She is anxious as her current psychiatrist Dr. Vallarie Mare is retiring and reported that he has been prescribing her Seroquel 500 mg at night as well as lithium carbonate 300 mg in the morning and 600 at bedtime. She recently had her lithium level done at her PCP office and she brought the paperwork with her. Her lithium level is 1.1 at this time. She reported that she takes her medications as prescribed and is not experiencing any adverse effects of the medications. Patient stated that when she takes higher doses of the medication she started kicking and starts having anxiety symptoms related to the lithium dose. Patient reported that her anxiety is controlled with the help of buspirone. She stated that Dr. Vallarie Mare have advised her that her mirtazapine does need to be decreased and she feels that she does not want to lower the dose of the medication at this time. She feels that her relationship is good with her family members at this time and she is enjoying the holidays. She appeared somewhat hyper during the interview but reported that this is her baseline and all her medications are controlled  She stated that she is very excited that she is able to find a new psychiatrist as she was very anxious with Dr. Lianne Moris departure. She denied having any suicidal homicidal ideations or plans. She denied having any perceptual disturbances she denied having any thoughts to hurt herself.    Past psychiatric history: Patient has a  history of long-standing bipolar disorder. Nevertheless she apparently has never had a psychotic mania. She does report some behavior in the late summer in which she may have had sex with some strangers while intoxicated. She feels particularly guilty about this now. She has had a history of suicide attempts in the 1990s with a threatened hanging and self cutting. Multiple medications have been tried.  Social history: Patient is married. Lives with her husband. Has 2 adult children. She tries to stay busy with volunteer work and work around [employer] but has had trouble with that recently.  Family history: She reports several suicides or suicide attempts in first or second degree relatives. Fairly widespread depression and suicidality. Denies that she has any history of substance abuse.  Patient denies history of drug abuse although she says she drinks about 3 times a week she denies that has been a problem for her.  Medical history: History of high blood pressure. She has had multiple surgeries for minor orthopedic problems and says she had trouble with agitation on one occasion but no other complications from anesthesia. No history of myocardial infarction or stroke. Principal Problem:  Diagnosis:   Patient Active Problem List   Diagnosis Date Noted  . Bipolar disorder ([place]) [F31.9] 08/05/2015  . Essential hypertension [I10] 08/05/2015  . Hypothyroidism [E03.9] 08/05/2015  . Hypercholesteremia [E78.00] 08/05/2015  . CKD (chronic kidney disease) stage 3, GFR 30-59 ml/min [N18.3]    Total Time spent with patient: 30 mins     Past Medical History:  Past  Medical History  Diagnosis Date  . CKD (chronic kidney disease) stage 3, GFR 30-59 ml/min   . Anxiety   . Depression   . Bipolar disorder ([place])   . Complication of anesthesia   . PONV (postoperative nausea and vomiting)   . Substance abuse     alcohol  . History of diverticulosis   . Overactive bladder     Past Surgical History   Procedure Laterality Date  . Foot surgery Bilateral   . Knee surgery Left   . Tonsillectomy and adenoidectomy     Family History:  Family History  Problem Relation Age of Onset  . Alzheimer's disease Mother   . Bipolar disorder Mother   . Depression Mother   . Heart attack Father   . Diabetes Brother   . Obesity Brother   . Depression Maternal Grandfather   . Depression Daughter    Family Psychiatric  History: Family history positive for multiple people with depression and a few people who have committed suicide. Social History:  History  Alcohol Use  . 1.8 oz/week  . 0 Glasses of wine, 0 Cans of beer, 3 Shots of liquor, 0 Standard drinks or equivalent per week     History  Drug Use No    Social History   Social History  . Marital Status: Married    Spouse Name: N/A  . Number of Children: N/A  . Years of Education: N/A   Social History Main Topics  . Smoking status: Former Smoker    Types: Cigarettes    Start date: 07/09/1967    Quit date: 07/08/1985  . Smokeless tobacco: Never Used  . Alcohol Use: 1.8 oz/week    0 Glasses of wine, 0 Cans of beer, 3 Shots of liquor, 0 Standard drinks or equivalent per week  . Drug Use: No  . Sexual Activity: Not Currently   Other Topics Concern  . None   Social History Narrative   Additional Social History:                         Sleep: Poor  Appetite:  Good  Current Medications: Current Outpatient Prescriptions  Medication Sig Dispense Refill  . aspirin EC 81 MG tablet Take by mouth.    Marland Kitchen atorvastatin (LIPITOR) 20 MG tablet Take 1 tablet (20 mg total) by mouth daily at 6 PM. 90 tablet 4  . benazepril (LOTENSIN) 5 MG tablet Take 1 tablet (5 mg total) by mouth daily. 90 tablet 4  . busPIRone (BUSPAR) 15 MG tablet Take 1 tablet (15 mg total) by mouth 3 (three) times daily. 270 tablet 4  . lithium 300 MG tablet Take 300 mg by mouth 3 (three) times daily.   1  . mirtazapine (REMERON) 30 MG tablet Take 30 mg  by mouth at bedtime.  3  . Omega-3 Fatty Acids (FISH OIL) 1000 MG CAPS Take by mouth.    . QUEtiapine (SEROQUEL) 100 MG tablet Take 500 mg by mouth.   0  . metoprolol succinate (TOPROL-XL) 25 MG 24 hr tablet Take 1 tablet (25 mg total) by mouth daily. (Patient not taking: Reported on 08/12/2015) 90 tablet 4   No current facility-administered medications for this visit.    Lab Results: No results found for this or any previous visit (from the past 48 hour(s)).  Physical Findings: AIMS:  , ,  ,  ,    CIWA:    COWS:     Musculoskeletal:  Strength & Muscle Tone: within normal limits Gait & Station: normal Patient leans: N/A  Psychiatric Specialty Exam: ROS   Blood pressure 124/80, pulse 80, temperature 98.4 F (36.9 C), temperature source Tympanic, height 5' 5.2" (1.656 m), weight 188 lb 6.4 oz (85.458 kg), SpO2 95 %.Body mass index is 31.16 kg/(m^2).  General Appearance: Casual  Eye Contact::  Good  Speech:  Normal Rate  Volume:  Normal  Mood:  Anxious and Dysphoric  Affect:  Depressed and Tearful  Thought Process:  Coherent  Orientation:  Full (Time, Place, and Person)  Thought Content:  WDL  Suicidal Thoughts:  No  Homicidal Thoughts:  No  Memory:  Immediate;   Good Recent;   Good Remote;   Good  Judgement:  Fair  Insight:  Good  Psychomotor Activity:  Normal  Concentration:  Fair  Recall:  [employer] of Knowledge:Fair  Language: Fair  Akathisia:  No  Handed:  Right  AIMS (if indicated):     Assets:  Communication Skills Desire for Improvement Financial Resources/Insurance Housing Intimacy Leisure Time [place] Talents/Skills Transportation  ADL's:  Intact  Cognition: WNL  Sleep:      Treatment Plan Summary:  Discussed with patient at length about the medications treatment risks benefits and alternatives  Mood swings She will continue on Seroquel 500 mg on a daily basis. She will also continue on lithium 300 mg in  the morning and 600 mg at bedtime Currently the Li  level is 1.1  Depression She is currently on Remeron 30 mg at bedtime  Anxiety She will  continue on buspirone 15 mg by mouth 3 times a day  All the medications were sent to the mail order pharmacy  She will follow-up in 6 weeks   More than 50% of the time spent in psychoeducation, counseling and coordination of care.    This note was generated in part or whole with voice recognition software. Voice regonition is usually quite accurate but there are transcription errors that can and very often do occur. I apologize for any typographical errors that were not detected and corrected.     Rainey Pines, MD  08/12/2015, 1:11 PM  HPI

## 2015-08-13 ENCOUNTER — Other Ambulatory Visit: Payer: Self-pay

## 2015-08-13 DIAGNOSIS — Z1239 Encounter for other screening for malignant neoplasm of breast: Secondary | ICD-10-CM

## 2015-08-13 NOTE — Telephone Encounter (Signed)
Discussed with Janett Billow. Her total dose is 500mg  at night.

## 2015-08-13 NOTE — Telephone Encounter (Signed)
per dr. Gretel Acre call in a 100mg  one at bedtime and call in a 400mg  one at bedtime pt to take a total of 500mg  . pharmacy was called and given instructions

## 2015-09-19 ENCOUNTER — Other Ambulatory Visit: Payer: Self-pay

## 2015-09-19 NOTE — Telephone Encounter (Signed)
received a request for seroquel 90 day supply pt has appt on  09-25-14 last seen on  08-12-15.

## 2015-09-23 ENCOUNTER — Ambulatory Visit: Payer: BC Managed Care – PPO | Admitting: Psychiatry

## 2015-09-24 MED ORDER — QUETIAPINE FUMARATE 100 MG PO TABS: 400.0000 mg | ORAL_TABLET | Freq: Every day | ORAL | Status: DC

## 2015-09-26 ENCOUNTER — Encounter: Payer: Self-pay | Admitting: Psychiatry

## 2015-09-26 ENCOUNTER — Ambulatory Visit (INDEPENDENT_AMBULATORY_CARE_PROVIDER_SITE_OTHER): Payer: BC Managed Care – PPO | Admitting: Psychiatry

## 2015-09-26 VITALS — BP 122/88 | HR 80 | Temp 97.7°F | Ht 65.2 in | Wt 195.4 lb

## 2015-09-26 DIAGNOSIS — F3130 Bipolar disorder, current episode depressed, mild or moderate severity, unspecified: Secondary | ICD-10-CM

## 2015-09-26 MED ORDER — GABAPENTIN 300 MG PO CAPS: 300.0000 mg | ORAL_CAPSULE | Freq: Two times a day (BID) | ORAL | Status: DC

## 2015-09-26 MED ORDER — QUETIAPINE FUMARATE 400 MG PO TABS: 400.0000 mg | ORAL_TABLET | Freq: Every day | ORAL | Status: DC

## 2015-09-26 MED ORDER — BUSPIRONE HCL 15 MG PO TABS: 15.0000 mg | ORAL_TABLET | Freq: Three times a day (TID) | ORAL | Status: DC

## 2015-09-26 MED ORDER — LITHIUM CARBONATE 300 MG PO TABS: 300.0000 mg | ORAL_TABLET | Freq: Three times a day (TID) | ORAL | Status: DC

## 2015-09-26 MED ORDER — MIRTAZAPINE 15 MG PO TABS: 30.0000 mg | ORAL_TABLET | Freq: Every day | ORAL | Status: DC

## 2015-09-26 NOTE — Progress Notes (Signed)
[facility] MD Progress Note  09/26/2015 11:10 AM Elizabeth Malone  MRN:  [id number] Subjective:    Patient is a 65 year-old woman seen  for follow-up.She appeared somewhat anxious and hyper during the interview. She reported that the holidays were very long. She reported that she is concerned about the relationship issues with her husband as he is jealous type person. She reported that she has started taking gabapentin 300 mg twice daily to control her anxiety. She has also decrease the dose of Seroquel to 400 mg. She reported that she used to have jerking movements on the higher dose of Seroquel. She stated that she is trying to control her mood symptoms. She has gained a lot of weight on the current combination of the medication and is concerned about her weight gain. Patient stated that she wants to start taking the Topamax but currently she is on the combination of lithium and Seroquel Remeron and gabapentin. She reported that she feels stable. She stated that she wants to have her medications adjusted and wants to stop taking the lithium. However she continues to have mood swings during the day and appears anxious and hyper due to the relationship issues with her husband. She reported that she was following with Dr. Bridgett Larsson in the past who has given her Topamax but he stopped prescribing her medication and told her that it might affect her memory.She appeared somewhat hyper during the interview but reported that this is her baseline and all her medications are controlled   Past psychiatric history: Patient has a history of long-standing bipolar disorder. Nevertheless she apparently has never had a psychotic mania. She does report some behavior in the late summer in which she may have had sex with some strangers while intoxicated. She feels particularly guilty about this now. She has had a history of suicide attempts in the 1990s when she  threatened hanging and self cutting. Multiple medications have been  tried.  Social history: Patient is married. Lives with her husband. Has 2 adult children. She tries to stay busy with volunteer work and work around [employer] but has had trouble with that recently.  Family history: She reports several suicides or suicide attempts in first or second degree relatives. Fairly widespread depression and suicidality. Denies that she has any history of substance abuse.  Patient denies history of drug abuse although she says she drinks about 3 times a week she denies that has been a problem for her.  Medical history: History of high blood pressure. She has had multiple surgeries for minor orthopedic problems and says she had trouble with agitation on one occasion but no other complications from anesthesia. No history of myocardial infarction or stroke. Principal Problem:  Diagnosis:   Patient Active Problem List   Diagnosis Date Noted  . Bipolar disorder ([place]) [F31.9] 08/05/2015  . Essential hypertension [I10] 08/05/2015  . Hypothyroidism [E03.9] 08/05/2015  . Hypercholesteremia [E78.00] 08/05/2015  . CKD (chronic kidney disease) stage 3, GFR 30-59 ml/min [N18.3]    Total Time spent with patient: 30 mins     Past Medical History:  Past Medical History  Diagnosis Date  . CKD (chronic kidney disease) stage 3, GFR 30-59 ml/min   . Anxiety   . Depression   . Bipolar disorder ([place])   . Complication of anesthesia   . PONV (postoperative nausea and vomiting)   . Substance abuse     alcohol  . History of diverticulosis   . Overactive bladder  Past Surgical History  Procedure Laterality Date  . Foot surgery Bilateral   . Knee surgery Left   . Tonsillectomy and adenoidectomy     Family History:  Family History  Problem Relation Age of Onset  . Alzheimer's disease Mother   . Bipolar disorder Mother   . Depression Mother   . Heart attack Father   . Diabetes Brother   . Obesity Brother   . Depression Maternal Grandfather   . Depression Daughter     Family Psychiatric  History: Family history positive for multiple people with depression and a few people who have committed suicide. Social History:  History  Alcohol Use  . 1.8 oz/week  . 0 Glasses of wine, 0 Cans of beer, 3 Shots of liquor, 0 Standard drinks or equivalent per week     History  Drug Use No    Social History   Social History  . Marital Status: Married    Spouse Name: N/A  . Number of Children: N/A  . Years of Education: N/A   Social History Main Topics  . Smoking status: Former Smoker    Types: Cigarettes    Start date: 07/09/1967    Quit date: 07/08/1985  . Smokeless tobacco: Never Used  . Alcohol Use: 1.8 oz/week    0 Glasses of wine, 0 Cans of beer, 3 Shots of liquor, 0 Standard drinks or equivalent per week  . Drug Use: No  . Sexual Activity: Not Currently   Other Topics Concern  . None   Social History Narrative   Additional Social History:                         Sleep: Poor  Appetite:  Good  Current Medications: Current Outpatient Prescriptions  Medication Sig Dispense Refill  . aspirin EC 81 MG tablet Take by mouth.    Marland Kitchen atorvastatin (LIPITOR) 20 MG tablet Take 1 tablet (20 mg total) by mouth daily at 6 PM. 90 tablet 4  . benazepril (LOTENSIN) 5 MG tablet Take 1 tablet (5 mg total) by mouth daily. 90 tablet 4  . busPIRone (BUSPAR) 15 MG tablet Take 1 tablet (15 mg total) by mouth 3 (three) times daily. 270 tablet 4  . gabapentin (NEURONTIN) 100 MG capsule Take 100 mg by mouth 3 (three) times daily.    . hydrOXYzine (VISTARIL) 25 MG capsule Take 25 mg by mouth 2 (two) times daily before a meal.    . lithium 300 MG tablet Take 1 tablet (300 mg total) by mouth 3 (three) times daily. 270 tablet 1  . metoprolol succinate (TOPROL-XL) 25 MG 24 hr tablet Take 1 tablet (25 mg total) by mouth daily. 90 tablet 4  . mirtazapine (REMERON) 30 MG tablet Take 1 tablet (30 mg total) by mouth at bedtime. 90 tablet 3  . Omega-3 Fatty Acids  (FISH OIL) 1000 MG CAPS Take by mouth.    . QUEtiapine (SEROQUEL) 400 MG tablet Take 400 mg by mouth at bedtime.     No current facility-administered medications for this visit.    Lab Results: No results found for this or any previous visit (from the past 48 hour(s)).  Physical Findings: AIMS:  , ,  ,  ,    CIWA:    COWS:     Musculoskeletal: Strength & Muscle Tone: within normal limits Gait & Station: normal Patient leans: N/A  Psychiatric Specialty Exam: ROS   Blood pressure 122/88, pulse 80, temperature  97.7 F (36.5 C), temperature source Tympanic, height 5' 5.2" (1.656 m), weight 195 lb 6.4 oz (88.633 kg), SpO2 97 %.Body mass index is 32.32 kg/(m^2).  General Appearance: Casual  Eye Contact::  Good  Speech:  Normal Rate  Volume:  Normal  Mood:  Anxious and Dysphoric  Affect:  Congruent  Thought Process:  Coherent  Orientation:  Full (Time, Place, and Person)  Thought Content:  WDL  Suicidal Thoughts:  No  Homicidal Thoughts:  No  Memory:  Immediate;   Good Recent;   Good Remote;   Good  Judgement:  Fair  Insight:  Good  Psychomotor Activity:  Normal  Concentration:  Fair  Recall:  [employer] of Knowledge:Fair  Language: Fair  Akathisia:  No  Handed:  Right  AIMS (if indicated):     Assets:  Communication Skills Desire for Improvement Financial Resources/Insurance Housing Intimacy Leisure Time [place] Talents/Skills Transportation  ADL's:  Intact  Cognition: WNL  Sleep:      Treatment Plan Summary:  Discussed with patient at length about the medications treatment risks benefits and alternatives  Mood swings She will continue on Seroquel 400 mg on a daily basis. She will also continue on lithium 300 mg in the morning and 600 mg at bedtime   Depression She is currently on Remeron 15  mg at bedtime and the dose was decreased to control her mood swings  Anxiety She will  continue on buspirone 15 mg by mouth  3 times a day  All the medications were sent to the mail order pharmacy- [facility]  She will follow-up in 2 months.    More than 50% of the time spent in psychoeducation, counseling and coordination of care.    This note was generated in part or whole with voice recognition software. Voice regonition is usually quite accurate but there are transcription errors that can and very often do occur. I apologize for any typographical errors that were not detected and corrected.     Rainey Pines, MD  09/26/2015, 11:10 AM  HPI

## 2015-09-27 ENCOUNTER — Telehealth: Payer: Self-pay | Admitting: Family Medicine

## 2015-09-27 MED ORDER — BENAZEPRIL HCL 5 MG PO TABS: 5.0000 mg | ORAL_TABLET | Freq: Every day | ORAL | Status: DC

## 2015-09-27 NOTE — Telephone Encounter (Signed)
Forward to provider

## 2015-09-27 NOTE — Telephone Encounter (Signed)
Pt needs refill for benazapril sent [facility]. [facility] sent it to [facility] instead.

## 2015-09-27 NOTE — Telephone Encounter (Signed)
Rx sent to her pharmacy 

## 2015-09-30 ENCOUNTER — Ambulatory Visit
Admission: RE | Admit: 2015-09-30 | Discharge: 2015-09-30 | Disposition: A | Discharge status: Home / Self Care | Payer: BC Managed Care – PPO | Source: Ambulatory Visit | Attending: Family Medicine | Admitting: Family Medicine

## 2015-09-30 ENCOUNTER — Other Ambulatory Visit: Payer: Self-pay | Admitting: Family Medicine

## 2015-09-30 DIAGNOSIS — Z1239 Encounter for other screening for malignant neoplasm of breast: Secondary | ICD-10-CM

## 2015-09-30 DIAGNOSIS — R928 Other abnormal and inconclusive findings on diagnostic imaging of breast: Secondary | ICD-10-CM

## 2015-09-30 DIAGNOSIS — Z1231 Encounter for screening mammogram for malignant neoplasm of breast: Secondary | ICD-10-CM | POA: Diagnosis not present

## 2015-10-17 ENCOUNTER — Ambulatory Visit
Admission: RE | Admit: 2015-10-17 | Discharge: 2015-10-17 | Disposition: A | Discharge status: Home / Self Care | Payer: BC Managed Care – PPO | Source: Ambulatory Visit | Attending: Family Medicine | Admitting: Family Medicine

## 2015-10-17 DIAGNOSIS — R928 Other abnormal and inconclusive findings on diagnostic imaging of breast: Secondary | ICD-10-CM | POA: Insufficient documentation

## 2015-11-15 NOTE — Progress Notes (Signed)
Dose changed

## 2015-11-21 ENCOUNTER — Encounter: Payer: Self-pay | Admitting: Psychiatry

## 2015-11-21 ENCOUNTER — Ambulatory Visit (INDEPENDENT_AMBULATORY_CARE_PROVIDER_SITE_OTHER): Payer: BC Managed Care – PPO | Admitting: Psychiatry

## 2015-11-21 VITALS — BP 122/86 | HR 79 | Temp 98.4°F | Ht 65.2 in | Wt 202.2 lb

## 2015-11-21 DIAGNOSIS — F3130 Bipolar disorder, current episode depressed, mild or moderate severity, unspecified: Secondary | ICD-10-CM

## 2015-11-21 MED ORDER — GABAPENTIN 300 MG PO CAPS: 300.0000 mg | ORAL_CAPSULE | Freq: Two times a day (BID) | ORAL | Status: DC

## 2015-11-21 MED ORDER — QUETIAPINE FUMARATE 400 MG PO TABS: 400.0000 mg | ORAL_TABLET | Freq: Every day | ORAL | Status: DC

## 2015-11-21 MED ORDER — LITHIUM CARBONATE 300 MG PO TABS: 300.0000 mg | ORAL_TABLET | Freq: Three times a day (TID) | ORAL | Status: DC

## 2015-11-21 MED ORDER — BUSPIRONE HCL 15 MG PO TABS: 15.0000 mg | ORAL_TABLET | Freq: Two times a day (BID) | ORAL | Status: DC

## 2015-11-21 MED ORDER — TOPIRAMATE 50 MG PO TABS: 50.0000 mg | ORAL_TABLET | Freq: Every day | ORAL | Status: DC

## 2015-11-21 NOTE — Progress Notes (Signed)
[facility] MD Progress Note  11/21/2015 11:36 AM Elizabeth Malone  MRN:  [id number] Subjective:    Patient is a 65 year-old woman seen  for follow-up.She appeared somewhat anxious and and apprehensive during the interview. She reports that she is coming back from her walking trip as she has started walking on Thursday mornings with her group of friends. She remained focus on her weight loss. She reported that she was getting Topamax from Dr. Bridgett Larsson and stopped her medications in October. She stated that she does not remember the reason why the medication was stopped. She reported that it really helped her with the weight loss. She wants to restart the medication again. She stated that the Seroquel and Remeron is causing her to gain weight. Patient stated that she has been taking her other medications as prescribed. She feels that the medications are helping with her mood symptoms and she takes Seroquel regularly at bedtime. She has been taking BuSpar to help with her anxiety. Patient reported that she does not have any jerking movements at this time. Patient currently denied having any mood swings and reported that she sleeps well at night. She reported that she was feeling depressed during the fall but now her depression has improved significantly. She reported that she takes lithium regularly and it has been helping with her symptoms as well. She currently denied having any suicidal homicidal ideations or plans. She denied having any perceptual disturbances.    Principal Problem:  Diagnosis:   Patient Active Problem List   Diagnosis Date Noted  . Bipolar disorder ([place]) [F31.9] 08/05/2015  . Essential hypertension [I10] 08/05/2015  . Hypothyroidism [E03.9] 08/05/2015  . Hypercholesteremia [E78.00] 08/05/2015  . CKD (chronic kidney disease) stage 3, GFR 30-59 ml/min [N18.3]     Past Medical History:  Past Medical History  Diagnosis Date  . CKD (chronic kidney disease) stage 3, GFR 30-59 ml/min   . Anxiety    . Depression   . Bipolar disorder ([place])   . Complication of anesthesia   . PONV (postoperative nausea and vomiting)   . Substance abuse     alcohol  . History of diverticulosis   . Overactive bladder     Past Surgical History  Procedure Laterality Date  . Foot surgery Bilateral   . Knee surgery Left   . Tonsillectomy and adenoidectomy     Family History:  Family History  Problem Relation Age of Onset  . Alzheimer's disease Mother   . Bipolar disorder Mother   . Depression Mother   . Heart attack Father   . Diabetes Brother   . Obesity Brother   . Depression Maternal Grandfather   . Depression Daughter   . Breast cancer Neg Hx    Family Psychiatric  History: Family history positive for multiple people with depression and a few people who have committed suicide. Social History:  History  Alcohol Use  . 1.8 oz/week  . 0 Glasses of wine, 0 Cans of beer, 3 Shots of liquor, 0 Standard drinks or equivalent per week     History  Drug Use No    Social History   Social History  . Marital Status: Married    Spouse Name: N/A  . Number of Children: N/A  . Years of Education: N/A   Social History Main Topics  . Smoking status: Former Smoker    Types: Cigarettes    Start date: 07/09/1967    Quit date: 07/08/1985  . Smokeless tobacco: Never Used  .  Alcohol Use: 1.8 oz/week    0 Glasses of wine, 0 Cans of beer, 3 Shots of liquor, 0 Standard drinks or equivalent per week  . Drug Use: No  . Sexual Activity: Not Currently   Other Topics Concern  . None   Social History Narrative   Additional Social History:                         Sleep: Poor  Appetite:  Good  Current Medications: Current Outpatient Prescriptions  Medication Sig Dispense Refill  . aspirin EC 81 MG tablet Take by mouth.    Marland Kitchen atorvastatin (LIPITOR) 20 MG tablet Take 1 tablet (20 mg total) by mouth daily at 6 PM. 90 tablet 4  . benazepril (LOTENSIN) 5 MG tablet Take 1 tablet (5 mg  total) by mouth daily. 90 tablet 4  . busPIRone (BUSPAR) 15 MG tablet Take 1 tablet (15 mg total) by mouth 3 (three) times daily. 270 tablet 4  . gabapentin (NEURONTIN) 300 MG capsule Take 1 capsule (300 mg total) by mouth 2 (two) times daily. 180 capsule 2  . lithium 300 MG tablet Take 1 tablet (300 mg total) by mouth 3 (three) times daily. 270 tablet 1  . metoprolol succinate (TOPROL-XL) 25 MG 24 hr tablet Take 1 tablet (25 mg total) by mouth daily. 90 tablet 4  . Omega-3 Fatty Acids (FISH OIL) 1000 MG CAPS Take by mouth.    . QUEtiapine (SEROQUEL) 400 MG tablet Take 1 tablet (400 mg total) by mouth at bedtime. 90 tablet 2   No current facility-administered medications for this visit.    Lab Results: No results found for this or any previous visit (from the past 48 hour(s)).  Physical Findings: AIMS:  , ,  ,  ,    CIWA:    COWS:     Musculoskeletal: Strength & Muscle Tone: within normal limits Gait & Station: normal Patient leans: N/A  Psychiatric Specialty Exam: ROS   Blood pressure 122/86, pulse 79, temperature 98.4 F (36.9 C), temperature source Tympanic, height 5' 5.2" (1.656 m), weight 202 lb 3.2 oz (91.717 kg), SpO2 97 %.Body mass index is 33.44 kg/(m^2).  General Appearance: Casual  Eye Contact::  Good  Speech:  Normal Rate  Volume:  Normal  Mood:  Anxious and Dysphoric  Affect:  Congruent  Thought Process:  Coherent  Orientation:  Full (Time, Place, and Person)  Thought Content:  WDL  Suicidal Thoughts:  No  Homicidal Thoughts:  No  Memory:  Immediate;   Good Recent;   Good Remote;   Good  Judgement:  Fair  Insight:  Good  Psychomotor Activity:  Normal  Concentration:  Fair  Recall:  [employer] of Knowledge:Fair  Language: Fair  Akathisia:  No  Handed:  Right  AIMS (if indicated):     Assets:  Communication Skills Desire for Improvement Financial Resources/Insurance Housing Intimacy Leisure Time [place] Talents/Skills Transportation  ADL's:  Intact  Cognition: WNL  Sleep:      Treatment Plan Summary:  Discussed with patient at length about the medications treatment risks benefits and alternatives  Mood swings She will continue on Seroquel 400 mg on a daily basis. She will also continue on lithium 300 mg in the morning and 600 mg at bedtime   Anxiety She will  continue on buspirone 15 mg by mouth 2 times a day  I will start her on Topamax 50  mg daily. She still has some pills from her old medications.  All the medications were sent to the mail order pharmacy- [facility]  She will follow-up in 1 months.    More than 50% of the time spent in psychoeducation, counseling and coordination of care.    This note was generated in part or whole with voice recognition software. Voice regonition is usually quite accurate but there are transcription errors that can and very often do occur. I apologize for any typographical errors that were not detected and corrected.     Rainey Pines, MD  11/21/2015, 11:36 AM  HPI

## 2015-12-24 ENCOUNTER — Encounter: Payer: Self-pay | Admitting: Psychiatry

## 2015-12-24 ENCOUNTER — Ambulatory Visit (INDEPENDENT_AMBULATORY_CARE_PROVIDER_SITE_OTHER): Payer: BC Managed Care – PPO | Admitting: Psychiatry

## 2015-12-24 VITALS — BP 122/84 | HR 71 | Temp 98.1°F | Ht 65.2 in | Wt 193.8 lb

## 2015-12-24 DIAGNOSIS — F3130 Bipolar disorder, current episode depressed, mild or moderate severity, unspecified: Secondary | ICD-10-CM

## 2015-12-24 NOTE — Progress Notes (Signed)
[facility] MD Progress Note  12/24/2015 11:07 AM Elizabeth Malone  MRN:  [id number] Subjective:    Patient is a 65 year-old married female with history of bipolar disorder who presented for follow-up appointment. She appeared apprehensive during the interview and brought a list of her issues as she reported that she will usually forget. She reported that she has started taking her medications and has lost weight since she was started on Topamax. She reported that she was not attending her church and was not going to her friend's group as she was feeling depressed. However she has called them and asked them that she will come to their next meeting. She reported that her husband has mood swings and he will start talking about her behavior whenever he will wake up in the morning. She has been dealing with him for the past 30 years. She was asking about titrating the dose of BuSpar to 15 mg 3 times daily. She reported that she is compliant with her medications and will try to walk on the treadmill on a daily basis as she is focused on losing weight. She currently denied having any adverse reactions from the medications. She continues to be talkative during the interview. She reported that she sleeps well at night. She denied having any suicidal ideations or plans. She denied having any perceptual disturbances at this time.       Principal Problem:  Diagnosis:   Patient Active Problem List   Diagnosis Date Noted  . Bipolar disorder ([place]) [F31.9] 08/05/2015  . Essential hypertension [I10] 08/05/2015  . Hypothyroidism [E03.9] 08/05/2015  . Hypercholesteremia [E78.00] 08/05/2015  . CKD (chronic kidney disease) stage 3, GFR 30-59 ml/min [N18.3]     Past Medical History:  Past Medical History  Diagnosis Date  . CKD (chronic kidney disease) stage 3, GFR 30-59 ml/min   . Anxiety   . Depression   . Bipolar disorder ([place])   . Complication of anesthesia   . PONV (postoperative nausea and vomiting)   . Substance  abuse     alcohol  . History of diverticulosis   . Overactive bladder     Past Surgical History  Procedure Laterality Date  . Foot surgery Bilateral   . Knee surgery Left   . Tonsillectomy and adenoidectomy     Family History:  Family History  Problem Relation Age of Onset  . Alzheimer's disease Mother   . Bipolar disorder Mother   . Depression Mother   . Heart attack Father   . Diabetes Brother   . Obesity Brother   . Depression Maternal Grandfather   . Depression Daughter   . Breast cancer Neg Hx    Family Psychiatric  History: Family history positive for multiple people with depression and a few people who have committed suicide. Social History:  History  Alcohol Use  . 1.8 oz/week  . 0 Glasses of wine, 0 Cans of beer, 3 Shots of liquor, 0 Standard drinks or equivalent per week     History  Drug Use No    Social History   Social History  . Marital Status: Married    Spouse Name: N/A  . Number of Children: N/A  . Years of Education: N/A   Social History Main Topics  . Smoking status: Former Smoker    Types: Cigarettes    Start date: 07/09/1967    Quit date: 07/08/1985  . Smokeless tobacco: Never Used  . Alcohol Use: 1.8 oz/week    0 Glasses  of wine, 0 Cans of beer, 3 Shots of liquor, 0 Standard drinks or equivalent per week  . Drug Use: No  . Sexual Activity: Not Currently   Other Topics Concern  . None   Social History Narrative   Additional Social History:                         Sleep: Poor  Appetite:  Good  Current Medications: Current Outpatient Prescriptions  Medication Sig Dispense Refill  . aspirin EC 81 MG tablet Take by mouth.    Marland Kitchen atorvastatin (LIPITOR) 20 MG tablet Take 1 tablet (20 mg total) by mouth daily at 6 PM. 90 tablet 4  . benazepril (LOTENSIN) 5 MG tablet Take 1 tablet (5 mg total) by mouth daily. 90 tablet 4  . busPIRone (BUSPAR) 15 MG tablet Take 1 tablet (15 mg total) by mouth 2 (two) times daily. 180 tablet 1   . gabapentin (NEURONTIN) 300 MG capsule Take 1 capsule (300 mg total) by mouth 2 (two) times daily. 180 capsule 2  . lithium 300 MG tablet Take 1 tablet (300 mg total) by mouth 3 (three) times daily. 270 tablet 1  . metoprolol succinate (TOPROL-XL) 25 MG 24 hr tablet Take 1 tablet (25 mg total) by mouth daily. 90 tablet 4  . Omega-3 Fatty Acids (FISH OIL) 1000 MG CAPS Take by mouth.    . QUEtiapine (SEROQUEL) 400 MG tablet Take 1 tablet (400 mg total) by mouth at bedtime. 90 tablet 2  . topiramate (TOPAMAX) 50 MG tablet Take 1 tablet (50 mg total) by mouth daily. 90 tablet 1   No current facility-administered medications for this visit.    Lab Results: No results found for this or any previous visit (from the past 48 hour(s)).  Physical Findings: AIMS:  , ,  ,  ,    CIWA:    COWS:     Musculoskeletal: Strength & Muscle Tone: within normal limits Gait & Station: normal Patient leans: N/A  Psychiatric Specialty Exam: ROS  Blood pressure 122/84, pulse 71, temperature 98.1 F (36.7 C), temperature source Tympanic, height 5' 5.2" (1.656 m), weight 193 lb 12.8 oz (87.907 kg), SpO2 98 %.Body mass index is 32.06 kg/(m^2).  General Appearance: Casual  Eye Contact::  Good  Speech:  Normal Rate  Volume:  Normal  Mood:  Anxious  Affect:  Congruent  Thought Process:  Coherent  Orientation:  Full (Time, Place, and Person)  Thought Content:  WDL  Suicidal Thoughts:  No  Homicidal Thoughts:  No  Memory:  Immediate;   Good Recent;   Good Remote;   Good  Judgement:  Fair  Insight:  Good  Psychomotor Activity:  Normal  Concentration:  Fair  Recall:  [employer] of Knowledge:Fair  Language: Fair  Akathisia:  No  Handed:  Right  AIMS (if indicated):     Assets:  Communication Skills Desire for Improvement Financial Resources/Insurance Housing Intimacy Leisure Time [place] Talents/Skills Transportation  ADL's:  Intact  Cognition: WNL  Sleep:       Treatment Plan Summary:  Discussed with patient at length about the medications treatment risks benefits and alternatives  Mood swings She will continue on Seroquel 400 mg on a daily basis. She will also continue on lithium 300 mg in the morning and 600 mg at bedtime Will continue on gabapentin 300 mg by mouth twice a day   Anxiety She will  continue  on buspirone 15 mg by mouth 3 times a day  Continue Topamax 50 mg daily.   Pt has supply of the medications  She will follow-up in 2 months.    More than 50% of the time spent in psychoeducation, counseling and coordination of care.    This note was generated in part or whole with voice recognition software. Voice regonition is usually quite accurate but there are transcription errors that can and very often do occur. I apologize for any typographical errors that were not detected and corrected.     Rainey Pines, MD  12/24/2015, 11:07 AM  HPI

## 2016-02-04 ENCOUNTER — Encounter: Payer: Self-pay | Admitting: Family Medicine

## 2016-02-04 ENCOUNTER — Ambulatory Visit (INDEPENDENT_AMBULATORY_CARE_PROVIDER_SITE_OTHER): Payer: BC Managed Care – PPO | Admitting: Family Medicine

## 2016-02-04 VITALS — BP 102/67 | HR 69 | Temp 98.3°F | Ht 65.5 in | Wt 194.0 lb

## 2016-02-04 DIAGNOSIS — E7800 Pure hypercholesterolemia, unspecified: Secondary | ICD-10-CM

## 2016-02-04 DIAGNOSIS — I10 Essential (primary) hypertension: Secondary | ICD-10-CM

## 2016-02-04 DIAGNOSIS — F3132 Bipolar disorder, current episode depressed, moderate: Secondary | ICD-10-CM

## 2016-02-04 NOTE — Assessment & Plan Note (Signed)
The current medical regimen is effective;  continue present plan and medications.  

## 2016-02-04 NOTE — Progress Notes (Signed)
BP 102/67 mmHg  Pulse 69  Temp(Src) 98.3 F (36.8 C)  Ht 5' 5.5" (1.664 m)  Wt 194 lb (87.998 kg)  BMI 31.78 kg/m2  SpO2 95%   Subjective:    Patient ID: Elizabeth Malone, female    DOB: 26-Nov-1950, 65 y.o.   MRN: [id number]  HPI: Elizabeth Malone is a 65 y.o. female  Chief Complaint  Patient presents with  . Hypertension    labs not drawn yet, she had a question  . Hyperlipidemia  Patient doing well with blood pressure medications no complaints takes without side effects and good control. Cholesterol medicine no problems or issues takes Lipitor faithfully without side effects. Bipolar stable followed by psychiatry needs blood work  Relevant past medical, surgical, family and social history reviewed and updated as indicated. Interim medical history since our last visit reviewed. Allergies and medications reviewed and updated.  Review of Systems  Constitutional: Negative.   Cardiovascular: Negative.     Per HPI unless specifically indicated above     Objective:    BP 102/67 mmHg  Pulse 69  Temp(Src) 98.3 F (36.8 C)  Ht 5' 5.5" (1.664 m)  Wt 194 lb (87.998 kg)  BMI 31.78 kg/m2  SpO2 95%  Wt Readings from Last 3 Encounters:  02/04/16 194 lb (87.998 kg)  12/24/15 193 lb 12.8 oz (87.907 kg)  11/21/15 202 lb 3.2 oz (91.717 kg)    Physical Exam  Constitutional: She is oriented to person, place, and time. She appears well-developed and well-nourished. No distress.  HENT:  Head: Normocephalic and atraumatic.  Right Ear: Hearing normal.  Left Ear: Hearing normal.  Nose: Nose normal.  Eyes: Conjunctivae and lids are normal. Right eye exhibits no discharge. Left eye exhibits no discharge. No scleral icterus.  Cardiovascular: Normal rate, regular rhythm and normal heart sounds.   Pulmonary/Chest: Effort normal and breath sounds normal. No respiratory distress.  Musculoskeletal: Normal range of motion.  Neurological: She is alert and oriented to person, place, and time.   Skin: Skin is intact. No rash noted.  Psychiatric: She has a normal mood and affect. Her speech is normal and behavior is normal. Judgment and thought content normal. Cognition and memory are normal.    Results for orders placed or performed in visit on 08/05/15  TSH  Result Value Ref Range   TSH 2.160 0.450 - 4.500 uIU/mL  Lithium level  Result Value Ref Range   Lithium Lvl 1.1 0.6 - 1.4 mmol/L      Assessment & Plan:   Problem List Items Addressed This Visit      Cardiovascular and Mediastinum   Essential hypertension - Primary    The current medical regimen is effective;  continue present plan and medications.       Relevant Orders   Comprehensive metabolic panel     Other   Bipolar disorder ([place])    The current medical regimen is effective;  continue present plan and medications.       Relevant Orders   TSH   Lipid panel   CBC with Differential/Platelet   Comprehensive metabolic panel   Lithium level   Hepatitis C antibody   Hypercholesteremia    The current medical regimen is effective;  continue present plan and medications.       Relevant Orders   Lipid panel       Follow up plan: Return in about 6 months (around 08/06/2016), or if symptoms worsen or fail to improve, for Physical  Exam.

## 2016-02-05 ENCOUNTER — Encounter: Payer: Self-pay | Admitting: Family Medicine

## 2016-02-05 LAB — COMPREHENSIVE METABOLIC PANEL
ALBUMIN: 4.4 g/dL (ref 3.6–4.8)
ALT: 17 IU/L (ref 0–32)
AST: 16 IU/L (ref 0–40)
Albumin/Globulin Ratio: 1.8 (ref 1.2–2.2)
Alkaline Phosphatase: 86 IU/L (ref 39–117)
BUN / CREAT RATIO: 17 (ref 12–28)
BUN: 15 mg/dL (ref 8–27)
Bilirubin Total: 0.3 mg/dL (ref 0.0–1.2)
CO2: 21 mmol/L (ref 18–29)
CREATININE: 0.86 mg/dL (ref 0.57–1.00)
Calcium: 9.8 mg/dL (ref 8.7–10.3)
Chloride: 105 mmol/L (ref 96–106)
GFR, EST AFRICAN AMERICAN: 83 mL/min/{1.73_m2} (ref >59)
GFR, EST NON AFRICAN AMERICAN: 72 mL/min/{1.73_m2} (ref >59)
Globulin, Total: 2.5 g/dL (ref 1.5–4.5)
Glucose: 106 mg/dL — ABNORMAL HIGH (ref 65–99)
Potassium: 4.7 mmol/L (ref 3.5–5.2)
Sodium: 141 mmol/L (ref 134–144)
TOTAL PROTEIN: 6.9 g/dL (ref 6.0–8.5)

## 2016-02-05 LAB — CBC WITH DIFFERENTIAL/PLATELET
BASOS ABS: 0 10*3/uL (ref 0.0–0.2)
Basos: 0 %
EOS (ABSOLUTE): 0.2 10*3/uL (ref 0.0–0.4)
EOS: 3 %
HEMOGLOBIN: 13.6 g/dL (ref 11.1–15.9)
Hematocrit: 41.4 % (ref 34.0–46.6)
Immature Grans (Abs): 0 10*3/uL (ref 0.0–0.1)
Immature Granulocytes: 0 %
LYMPHS: 29 %
Lymphocytes Absolute: 1.7 10*3/uL (ref 0.7–3.1)
MCH: 30.8 pg (ref 26.6–33.0)
MCHC: 32.9 g/dL (ref 31.5–35.7)
MCV: 94 fL (ref 79–97)
MONOCYTES: 8 %
Monocytes Absolute: 0.4 10*3/uL (ref 0.1–0.9)
NEUTROS ABS: 3.4 10*3/uL (ref 1.4–7.0)
Neutrophils: 60 %
Platelets: 257 10*3/uL (ref 150–379)
RBC: 4.42 x10E6/uL (ref 3.77–5.28)
RDW: 13 % (ref 12.3–15.4)
WBC: 5.7 10*3/uL (ref 3.4–10.8)

## 2016-02-05 LAB — LIPID PANEL
CHOL/HDL RATIO: 2.7 ratio (ref 0.0–4.4)
Cholesterol, Total: 159 mg/dL (ref 100–199)
HDL: 58 mg/dL (ref >39)
LDL Calculated: 74 mg/dL (ref 0–99)
TRIGLYCERIDES: 135 mg/dL (ref 0–149)
VLDL Cholesterol Cal: 27 mg/dL (ref 5–40)

## 2016-02-05 LAB — HEPATITIS C ANTIBODY: Hep C Virus Ab: <0.1 s/co ratio (ref 0.0–0.9)

## 2016-02-05 LAB — TSH: TSH: 2.74 u[IU]/mL (ref 0.450–4.500)

## 2016-02-05 LAB — LITHIUM LEVEL: Lithium Lvl: 0.8 mmol/L (ref 0.6–1.2)

## 2016-02-12 ENCOUNTER — Other Ambulatory Visit: Payer: Self-pay | Admitting: Psychiatry

## 2016-02-13 ENCOUNTER — Telehealth: Payer: Self-pay

## 2016-02-13 NOTE — Telephone Encounter (Signed)
received a refill requested from [facility] for pt buspar pt was last seen on  12-24-15 next appt 02-20-16

## 2016-02-14 MED ORDER — BUSPIRONE HCL 15 MG PO TABS: 15.0000 mg | ORAL_TABLET | Freq: Two times a day (BID) | ORAL | Status: DC

## 2016-02-20 ENCOUNTER — Encounter: Payer: Self-pay | Admitting: Psychiatry

## 2016-02-20 ENCOUNTER — Ambulatory Visit (INDEPENDENT_AMBULATORY_CARE_PROVIDER_SITE_OTHER): Payer: BC Managed Care – PPO | Admitting: Psychiatry

## 2016-02-20 VITALS — BP 118/79 | HR 67 | Temp 97.5°F | Ht 65.6 in | Wt 198.6 lb

## 2016-02-20 DIAGNOSIS — F3130 Bipolar disorder, current episode depressed, mild or moderate severity, unspecified: Secondary | ICD-10-CM | POA: Diagnosis not present

## 2016-02-20 MED ORDER — BUSPIRONE HCL 15 MG PO TABS: 15.0000 mg | ORAL_TABLET | Freq: Three times a day (TID) | ORAL | Status: DC

## 2016-02-20 NOTE — Progress Notes (Signed)
[facility] MD Progress Note  02/20/2016 10:16 AM JYAH BONNET  MRN:  [id number] Subjective:    Patient is a 65 year-old married female with history of bipolar disorder who presented for follow-up appointment. She stated that she has started noticing improvement in her anxiety symptoms. She is taking BuSpar 3 times daily. She reported that she is spending more time with her grandchildren as her 31 year old grandson has history of cutting himself. She is trying to help him. She reported that she feels more calm with the current combination of her medications. We went over her lab results and her lithium level is within therapeutic range. She reported that she is not experiencing any adverse effects of the medication. She sleeps well with the help of Seroquel. She is concerned about her medications as she will be turning 65 at the end of this year. She is concerned about the cost of the medications after that time. She reported that she is also going to talk to the billing as she has been getting several bills from this office and they are not being paid correctly.   Patient currently denied having any suicidal ideations or plans. She reported that her husband is very supportive. They're going for 2 weeks for medications and she is excited about the same. She has been compliant with her medications. She denied having any adverse effects. Her vital signs remain stable.        Principal Problem:  Diagnosis:   Patient Active Problem List   Diagnosis Date Noted  . Bipolar disorder ([place]) [F31.9] 08/05/2015  . Essential hypertension [I10] 08/05/2015  . Hypothyroidism [E03.9] 08/05/2015  . Hypercholesteremia [E78.00] 08/05/2015  . CKD (chronic kidney disease) stage 3, GFR 30-59 ml/min [N18.3]     Past Medical History:  Past Medical History  Diagnosis Date  . CKD (chronic kidney disease) stage 3, GFR 30-59 ml/min   . Anxiety   . Depression   . Bipolar disorder ([place])   . Complication of anesthesia   .  PONV (postoperative nausea and vomiting)   . Substance abuse     alcohol  . History of diverticulosis   . Overactive bladder     Past Surgical History  Procedure Laterality Date  . Foot surgery Bilateral   . Knee surgery Left   . Tonsillectomy and adenoidectomy     Family History:  Family History  Problem Relation Age of Onset  . Alzheimer's disease Mother   . Bipolar disorder Mother   . Depression Mother   . Heart attack Father   . Diabetes Brother   . Obesity Brother   . Depression Maternal Grandfather   . Depression Daughter   . Breast cancer Neg Hx    Family Psychiatric  History: Family history positive for multiple people with depression and a few people who have committed suicide. Social History:  History  Alcohol Use  . 1.8 oz/week  . 0 Glasses of wine, 0 Cans of beer, 3 Shots of liquor, 0 Standard drinks or equivalent per week     History  Drug Use No    Social History   Social History  . Marital Status: Married    Spouse Name: N/A  . Number of Children: N/A  . Years of Education: N/A   Social History Main Topics  . Smoking status: Former Smoker    Types: Cigarettes    Start date: 07/09/1967    Quit date: 07/08/1985  . Smokeless tobacco: Never Used  . Alcohol Use: 1.8  oz/week    0 Glasses of wine, 0 Cans of beer, 3 Shots of liquor, 0 Standard drinks or equivalent per week  . Drug Use: No  . Sexual Activity: Not Currently   Other Topics Concern  . Not on file   Social History Narrative   Additional Social History:                         Sleep: Poor  Appetite:  Good  Current Medications: Current Outpatient Prescriptions  Medication Sig Dispense Refill  . aspirin EC 81 MG tablet Take by mouth.    Marland Kitchen atorvastatin (LIPITOR) 20 MG tablet Take 1 tablet (20 mg total) by mouth daily at 6 PM. 90 tablet 4  . benazepril (LOTENSIN) 5 MG tablet Take 1 tablet (5 mg total) by mouth daily. 90 tablet 4  . busPIRone (BUSPAR) 15 MG tablet Take 1  tablet (15 mg total) by mouth 2 (two) times daily. 180 tablet 1  . Calcium Citrate-Vitamin D (CALCIUM + D PO) Take by mouth 3 (three) times daily.    Marland Kitchen gabapentin (NEURONTIN) 300 MG capsule Take 1 capsule (300 mg total) by mouth 2 (two) times daily. 180 capsule 2  . lithium 300 MG tablet Take 1 tablet (300 mg total) by mouth 3 (three) times daily. 270 tablet 1  . metoprolol succinate (TOPROL-XL) 25 MG 24 hr tablet Take 1 tablet (25 mg total) by mouth daily. 90 tablet 4  . Omega-3 Fatty Acids (FISH OIL) 1000 MG CAPS Take by mouth.    . QUEtiapine (SEROQUEL) 400 MG tablet Take 1 tablet (400 mg total) by mouth at bedtime. 90 tablet 2  . topiramate (TOPAMAX) 50 MG tablet Take 1 tablet (50 mg total) by mouth daily. 90 tablet 1   No current facility-administered medications for this visit.    Lab Results: No results found for this or any previous visit (from the past 48 hour(s)).  Physical Findings: AIMS:  , ,  ,  ,    CIWA:    COWS:     Musculoskeletal: Strength & Muscle Tone: within normal limits Gait & Station: normal Patient leans: N/A  Psychiatric Specialty Exam: ROS  There were no vitals taken for this visit.There is no weight on file to calculate BMI.  General Appearance: Casual  Eye Contact::  Good  Speech:  Normal Rate  Volume:  Normal  Mood:  Anxious  Affect:  Congruent  Thought Process:  Coherent  Orientation:  Full (Time, Place, and Person)  Thought Content:  WDL  Suicidal Thoughts:  No  Homicidal Thoughts:  No  Memory:  Immediate;   Good Recent;   Good Remote;   Good  Judgement:  Fair  Insight:  Good  Psychomotor Activity:  Normal  Concentration:  Fair  Recall:  [employer] of Knowledge:Fair  Language: Fair  Akathisia:  No  Handed:  Right  AIMS (if indicated):     Assets:  Communication Skills Desire for Improvement Financial Resources/Insurance Housing Intimacy Leisure Time [place] Talents/Skills Transportation   ADL's:  Intact  Cognition: WNL  Sleep:      Treatment Plan Summary:  Discussed with patient at length about the medications treatment risks benefits and alternatives  Mood swings She will continue on Seroquel 400 mg on a daily basis. She will also continue on lithium 300 mg in the morning and 600 mg at bedtime Will continue on gabapentin 300 mg by mouth twice  a day   Anxiety She will  continue on buspirone 15 mg by mouth 3 times a day  Continue Topamax 50 mg daily.   Pt has supply of the medications  She will follow-up in 2 months.    More than 50% of the time spent in psychoeducation, counseling and coordination of care.    This note was generated in part or whole with voice recognition software. Voice regonition is usually quite accurate but there are transcription errors that can and very often do occur. I apologize for any typographical errors that were not detected and corrected.     Rainey Pines, MD  02/20/2016, 10:16 AM  HPI

## 2016-02-23 ENCOUNTER — Observation Stay: Payer: BC Managed Care – PPO

## 2016-02-23 ENCOUNTER — Encounter: Payer: Self-pay | Admitting: Emergency Medicine

## 2016-02-23 ENCOUNTER — Observation Stay
Admission: EM | Admit: 2016-02-23 | Discharge: 2016-02-25 | Disposition: A | Discharge status: Home / Self Care | Payer: BC Managed Care – PPO | Attending: Internal Medicine | Admitting: Internal Medicine

## 2016-02-23 ENCOUNTER — Emergency Department: Payer: BC Managed Care – PPO

## 2016-02-23 DIAGNOSIS — Z818 Family history of other mental and behavioral disorders: Secondary | ICD-10-CM | POA: Diagnosis not present

## 2016-02-23 DIAGNOSIS — N3281 Overactive bladder: Secondary | ICD-10-CM | POA: Insufficient documentation

## 2016-02-23 DIAGNOSIS — N183 Chronic kidney disease, stage 3 (moderate): Secondary | ICD-10-CM | POA: Insufficient documentation

## 2016-02-23 DIAGNOSIS — S0003XA Contusion of scalp, initial encounter: Secondary | ICD-10-CM | POA: Insufficient documentation

## 2016-02-23 DIAGNOSIS — Z888 Allergy status to other drugs, medicaments and biological substances status: Secondary | ICD-10-CM | POA: Insufficient documentation

## 2016-02-23 DIAGNOSIS — E039 Hypothyroidism, unspecified: Secondary | ICD-10-CM | POA: Diagnosis not present

## 2016-02-23 DIAGNOSIS — Z833 Family history of diabetes mellitus: Secondary | ICD-10-CM | POA: Diagnosis not present

## 2016-02-23 DIAGNOSIS — Z880 Allergy status to penicillin: Secondary | ICD-10-CM | POA: Diagnosis not present

## 2016-02-23 DIAGNOSIS — F419 Anxiety disorder, unspecified: Secondary | ICD-10-CM | POA: Diagnosis not present

## 2016-02-23 DIAGNOSIS — F319 Bipolar disorder, unspecified: Secondary | ICD-10-CM | POA: Diagnosis not present

## 2016-02-23 DIAGNOSIS — Z885 Allergy status to narcotic agent status: Secondary | ICD-10-CM | POA: Diagnosis not present

## 2016-02-23 DIAGNOSIS — I129 Hypertensive chronic kidney disease with stage 1 through stage 4 chronic kidney disease, or unspecified chronic kidney disease: Secondary | ICD-10-CM | POA: Insufficient documentation

## 2016-02-23 DIAGNOSIS — R55 Syncope and collapse: Principal | ICD-10-CM

## 2016-02-23 DIAGNOSIS — Z9889 Other specified postprocedural states: Secondary | ICD-10-CM | POA: Insufficient documentation

## 2016-02-23 DIAGNOSIS — W19XXXA Unspecified fall, initial encounter: Secondary | ICD-10-CM | POA: Insufficient documentation

## 2016-02-23 DIAGNOSIS — Z79899 Other long term (current) drug therapy: Secondary | ICD-10-CM | POA: Insufficient documentation

## 2016-02-23 DIAGNOSIS — I639 Cerebral infarction, unspecified: Secondary | ICD-10-CM

## 2016-02-23 DIAGNOSIS — E785 Hyperlipidemia, unspecified: Secondary | ICD-10-CM | POA: Diagnosis not present

## 2016-02-23 DIAGNOSIS — Z7982 Long term (current) use of aspirin: Secondary | ICD-10-CM | POA: Diagnosis not present

## 2016-02-23 DIAGNOSIS — Z820 Family history of epilepsy and other diseases of the nervous system: Secondary | ICD-10-CM | POA: Diagnosis not present

## 2016-02-23 DIAGNOSIS — Z8249 Family history of ischemic heart disease and other diseases of the circulatory system: Secondary | ICD-10-CM | POA: Diagnosis not present

## 2016-02-23 DIAGNOSIS — Z87891 Personal history of nicotine dependence: Secondary | ICD-10-CM | POA: Diagnosis not present

## 2016-02-23 LAB — LITHIUM LEVEL: LITHIUM LVL: 0.93 mmol/L (ref 0.60–1.20)

## 2016-02-23 LAB — BASIC METABOLIC PANEL
ANION GAP: 8 (ref 5–15)
BUN: 19 mg/dL (ref 6–20)
CALCIUM: 10 mg/dL (ref 8.9–10.3)
CHLORIDE: 105 mmol/L (ref 101–111)
CO2: 24 mmol/L (ref 22–32)
Creatinine, Ser: 0.9 mg/dL (ref 0.44–1.00)
GFR calc non Af Amer: >60 mL/min (ref >60)
GLUCOSE: 110 mg/dL — AB (ref 65–99)
POTASSIUM: 4 mmol/L (ref 3.5–5.1)
Sodium: 137 mmol/L (ref 135–145)

## 2016-02-23 LAB — CBC
HCT: 43.1 % (ref 35.0–47.0)
HEMOGLOBIN: 14.6 g/dL (ref 12.0–16.0)
MCH: 31.3 pg (ref 26.0–34.0)
MCHC: 33.9 g/dL (ref 32.0–36.0)
MCV: 92.4 fL (ref 80.0–100.0)
PLATELETS: 215 10*3/uL (ref 150–440)
RBC: 4.66 MIL/uL (ref 3.80–5.20)
RDW: 13.2 % (ref 11.5–14.5)
WBC: 8.9 10*3/uL (ref 3.6–11.0)

## 2016-02-23 LAB — URINALYSIS COMPLETE WITH MICROSCOPIC (ARMC ONLY)
BILIRUBIN URINE: NEGATIVE
Bacteria, UA: NONE SEEN
GLUCOSE, UA: NEGATIVE mg/dL
HGB URINE DIPSTICK: NEGATIVE
Ketones, ur: NEGATIVE mg/dL
NITRITE: NEGATIVE
Protein, ur: NEGATIVE mg/dL
Specific Gravity, Urine: 1.006 (ref 1.005–1.030)
pH: 6 (ref 5.0–8.0)

## 2016-02-23 LAB — LIPID PANEL
CHOL/HDL RATIO: 3.7 ratio
Cholesterol: 209 mg/dL — ABNORMAL HIGH (ref 0–200)
HDL: 57 mg/dL (ref >40)
LDL Cholesterol: 92 mg/dL (ref 0–99)
TRIGLYCERIDES: 301 mg/dL — AB (ref <150)
VLDL: 60 mg/dL — ABNORMAL HIGH (ref 0–40)

## 2016-02-23 LAB — GLUCOSE, CAPILLARY
GLUCOSE-CAPILLARY: 107 mg/dL — AB (ref 65–99)
Glucose-Capillary: 101 mg/dL — ABNORMAL HIGH (ref 65–99)

## 2016-02-23 MED ORDER — CALCIUM CARBONATE-VITAMIN D 500-200 MG-UNIT PO TABS
1.0000 | ORAL_TABLET | Freq: Two times a day (BID) | ORAL | Status: DC
  Administered 2016-02-23 – 2016-02-25 (×4): 1 via ORAL
  Filled 2016-02-23 (×5): qty 1

## 2016-02-23 MED ORDER — ACETAMINOPHEN 500 MG PO TABS
1000.0000 mg | ORAL_TABLET | Freq: Once | ORAL | Status: AC
  Administered 2016-02-23: 1000 mg via ORAL

## 2016-02-23 MED ORDER — LITHIUM CARBONATE 300 MG PO TABS
300.0000 mg | ORAL_TABLET | Freq: Three times a day (TID) | ORAL | Status: DC
  Filled 2016-02-23: qty 1

## 2016-02-23 MED ORDER — SODIUM CHLORIDE 0.9% FLUSH
3.0000 mL | Freq: Two times a day (BID) | INTRAVENOUS | Status: DC
  Administered 2016-02-23 – 2016-02-25 (×3): 3 mL via INTRAVENOUS

## 2016-02-23 MED ORDER — ACETAMINOPHEN 500 MG PO TABS
ORAL_TABLET | ORAL | Status: AC
  Administered 2016-02-23: 1000 mg
  Filled 2016-02-23: qty 2

## 2016-02-23 MED ORDER — METOPROLOL SUCCINATE ER 25 MG PO TB24
25.0000 mg | ORAL_TABLET | Freq: Every day | ORAL | Status: DC
  Administered 2016-02-23 – 2016-02-24 (×2): 25 mg via ORAL
  Filled 2016-02-23 (×4): qty 1

## 2016-02-23 MED ORDER — LITHIUM CARBONATE 300 MG PO CAPS
300.0000 mg | ORAL_CAPSULE | Freq: Three times a day (TID) | ORAL | Status: DC
  Filled 2016-02-23: qty 1

## 2016-02-23 MED ORDER — OMEGA-3-ACID ETHYL ESTERS 1 G PO CAPS
1.0000 g | ORAL_CAPSULE | Freq: Every day | ORAL | Status: DC
  Administered 2016-02-23 – 2016-02-25 (×3): 1 g via ORAL
  Filled 2016-02-23 (×4): qty 1

## 2016-02-23 MED ORDER — TOPIRAMATE 25 MG PO TABS
50.0000 mg | ORAL_TABLET | Freq: Every day | ORAL | Status: DC
  Administered 2016-02-23 – 2016-02-24 (×2): 50 mg via ORAL
  Filled 2016-02-23 (×4): qty 2

## 2016-02-23 MED ORDER — LITHIUM CARBONATE 300 MG PO CAPS
300.0000 mg | ORAL_CAPSULE | Freq: Every morning | ORAL | Status: DC
  Administered 2016-02-24 – 2016-02-25 (×2): 300 mg via ORAL
  Filled 2016-02-23 (×3): qty 1

## 2016-02-23 MED ORDER — HEPARIN SODIUM (PORCINE) 5000 UNIT/ML IJ SOLN
5000.0000 [IU] | Freq: Three times a day (TID) | INTRAMUSCULAR | Status: DC
  Administered 2016-02-23 – 2016-02-24 (×2): 5000 [IU] via SUBCUTANEOUS
  Filled 2016-02-23 (×5): qty 1

## 2016-02-23 MED ORDER — ACETAMINOPHEN 500 MG PO TABS
1000.0000 mg | ORAL_TABLET | Freq: Once | ORAL | Status: DC
  Filled 2016-02-23: qty 2

## 2016-02-23 MED ORDER — BUSPIRONE HCL 5 MG PO TABS
15.0000 mg | ORAL_TABLET | Freq: Three times a day (TID) | ORAL | Status: DC
  Administered 2016-02-23 – 2016-02-25 (×5): 15 mg via ORAL
  Filled 2016-02-23 (×7): qty 3

## 2016-02-23 MED ORDER — ACETAMINOPHEN 500 MG PO TABS
ORAL_TABLET | ORAL | Status: AC
  Administered 2016-02-23: 1000 mg via ORAL
  Filled 2016-02-23: qty 2

## 2016-02-23 MED ORDER — ATORVASTATIN CALCIUM 20 MG PO TABS
20.0000 mg | ORAL_TABLET | Freq: Every day | ORAL | Status: DC
  Administered 2016-02-24: 20 mg via ORAL
  Filled 2016-02-23: qty 1

## 2016-02-23 MED ORDER — QUETIAPINE FUMARATE 200 MG PO TABS
400.0000 mg | ORAL_TABLET | Freq: Every day | ORAL | Status: DC
  Administered 2016-02-23 – 2016-02-24 (×2): 400 mg via ORAL
  Filled 2016-02-23 (×3): qty 2

## 2016-02-23 MED ORDER — GABAPENTIN 300 MG PO CAPS
300.0000 mg | ORAL_CAPSULE | Freq: Two times a day (BID) | ORAL | Status: DC
  Administered 2016-02-23 – 2016-02-25 (×4): 300 mg via ORAL
  Filled 2016-02-23 (×5): qty 1

## 2016-02-23 MED ORDER — BENAZEPRIL HCL 10 MG PO TABS
5.0000 mg | ORAL_TABLET | Freq: Every day | ORAL | Status: DC
  Administered 2016-02-24 – 2016-02-25 (×2): 5 mg via ORAL
  Filled 2016-02-23 (×3): qty 1

## 2016-02-23 MED ORDER — LITHIUM CARBONATE 300 MG PO CAPS
600.0000 mg | ORAL_CAPSULE | Freq: Every day | ORAL | Status: DC
  Administered 2016-02-23 – 2016-02-24 (×2): 600 mg via ORAL
  Filled 2016-02-23 (×2): qty 2

## 2016-02-23 MED ORDER — ASPIRIN 325 MG PO TABS
325.0000 mg | ORAL_TABLET | Freq: Every day | ORAL | Status: DC
  Filled 2016-02-23: qty 1

## 2016-02-23 NOTE — Progress Notes (Signed)
Pt. Arrived via stretcher, transferred herself to the bed with stand by assistance, Tele applied, NSR, box verified by Marcene Brawn, Therapist, sports. Pt. Is A&O, VSS, no SOB or acute distress noted. family at bedside. Skin assessment verified by Marcene Brawn, RN. Abrasions to medial forehead, bilateral palms, left knee, left and right 1st toe. All are scabbed. No other skin issues noted. General room orientation given. Instruction on how to use ascom and call [place] given. Moderate fall risk.

## 2016-02-23 NOTE — ED Provider Notes (Signed)
[facility] Emergency Department Provider Note  ____________________________________________  Time seen: 1:30 PM  I have reviewed the triage vital signs and the nursing notes.   HISTORY  Chief Complaint Loss of Consciousness    HPI Elizabeth Malone is a 65 y.o. female comes to the ED complaining of syncope.  She takes lithium and cervical daily. 3 days ago, she had a poor night of sleep and only get 6 hours of sleep compared to her usual's 10 or 12 hours of sleep. Since then she has felt generally shaky and not mentally sharp. Today in church she had an episode where she "spaced out" and was not aware of her surroundings for several minutes. She then feels like she returned back to her normal self, but on leaving church, she suddenly passed out, falling on the ground. She woke up without any motor deficits but did feel confused. She still was able to drive herself back home and her family member her to the emergency department. Their report that she does still seem to be confused with slowed cognition. Patient denies any recent illness. Denies preceding headache chest pain shortness breath back pain or abdominal pain before the syncope. No focal weakness or paresthesias. Compliant with medications, no recent changes in medication dosages.  Patient reports a subjective feeling of unsteadiness and shakiness.   Past Medical History  Diagnosis Date  . CKD (chronic kidney disease) stage 3, GFR 30-59 ml/min   . Anxiety   . Depression   . Bipolar disorder ([place])   . Complication of anesthesia   . PONV (postoperative nausea and vomiting)   . Substance abuse     alcohol  . History of diverticulosis   . Overactive bladder      Patient Active Problem List   Diagnosis Date Noted  . Bipolar disorder ([place]) 08/05/2015  . Essential hypertension 08/05/2015  . Hypothyroidism 08/05/2015  . Hypercholesteremia 08/05/2015  . CKD (chronic kidney disease) stage 3, GFR 30-59  ml/min      Past Surgical History  Procedure Laterality Date  . Foot surgery Bilateral   . Knee surgery Left   . Tonsillectomy and adenoidectomy       Current Outpatient Rx  Name  Route  Sig  Dispense  Refill  . aspirin EC 81 MG tablet   Oral   Take by mouth.         Marland Kitchen atorvastatin (LIPITOR) 20 MG tablet   Oral   Take 1 tablet (20 mg total) by mouth daily at 6 PM.   90 tablet   4   . benazepril (LOTENSIN) 5 MG tablet   Oral   Take 1 tablet (5 mg total) by mouth daily.   90 tablet   4   . busPIRone (BUSPAR) 15 MG tablet   Oral   Take 1 tablet (15 mg total) by mouth 3 (three) times daily. Pt has supply   180 tablet   1   . Calcium Citrate-Vitamin D (CALCIUM + D PO)   Oral   Take by mouth 3 (three) times daily.         Marland Kitchen gabapentin (NEURONTIN) 300 MG capsule   Oral   Take 1 capsule (300 mg total) by mouth 2 (two) times daily.   180 capsule   2   . lithium 300 MG tablet   Oral   Take 1 tablet (300 mg total) by mouth 3 (three) times daily.   270 tablet   1   .  metoprolol succinate (TOPROL-XL) 25 MG 24 hr tablet   Oral   Take 1 tablet (25 mg total) by mouth daily.   90 tablet   4   . Omega-3 Fatty Acids (FISH OIL) 1000 MG CAPS   Oral   Take by mouth.         . QUEtiapine (SEROQUEL) 400 MG tablet   Oral   Take 1 tablet (400 mg total) by mouth at bedtime.   90 tablet   2   . topiramate (TOPAMAX) 50 MG tablet   Oral   Take 1 tablet (50 mg total) by mouth daily.   90 tablet   1      Allergies Ace inhibitors; Clindamycin hcl; Fluoxetine; Hydrocodone-chlorpheniramine; Paroxetine hcl; Penicillin g benzathine; Penicillin v potassium; and Lamotrigine   Family History  Problem Relation Age of Onset  . Alzheimer's disease Mother   . Bipolar disorder Mother   . Depression Mother   . Heart attack Father   . Diabetes Brother   . Obesity Brother   . Depression Maternal Grandfather   . Depression Daughter   . Breast cancer Neg Hx      Social History Social History  Substance Use Topics  . Smoking status: Former Smoker    Types: Cigarettes    Start date: 07/09/1967    Quit date: 07/08/1985  . Smokeless tobacco: Never Used  . Alcohol Use: 1.8 oz/week    0 Glasses of wine, 0 Cans of beer, 3 Shots of liquor, 0 Standard drinks or equivalent per week    Review of Systems  Constitutional:   No fever or chills.  Eyes:   No vision changes.  ENT:   No sore throat. No rhinorrhea. Cardiovascular:   No chest pain. Respiratory:   No dyspnea or cough. Gastrointestinal:   Negative for abdominal pain, vomiting and diarrhea.  Genitourinary:   Negative for dysuria or difficulty urinating. Musculoskeletal:   Negative for focal pain or swelling Neurological:   Negative for headaches. Positive syncope and feeling of unsteadiness. 10-point ROS otherwise negative.  ____________________________________________   PHYSICAL EXAM:  VITAL SIGNS: ED Triage Vitals  Enc Vitals Group     BP --      Pulse Rate 02/23/16 1303 86     Resp 02/23/16 1303 18     Temp 02/23/16 1303 98.1 F (36.7 C)     Temp src --      SpO2 02/23/16 1303 100 %     Weight 02/23/16 1303 198 lb (89.812 kg)     Height 02/23/16 1303 5\' 5"  (1.651 m)     Head Cir --      Peak Flow --      Pain Score 02/23/16 1303 4     Pain Loc --      Pain Edu? --      Excl. in [place]? --     Vital signs reviewed, nursing assessments reviewed.   Constitutional:   Alert and oriented. No distress. Eyes:   No scleral icterus. No conjunctival pallor. PERRL. EOMI.  No nystagmus. ENT   Head:   Normocephalic with approximately 6 cm scalp hematoma over the mid forehead. There is abrasion of the overlying skin without laceration. Hemostatic.   Nose:   No congestion/rhinnorhea. No septal hematoma. No epistaxis. Nontender, no swelling   Mouth/Throat:   MMM, no pharyngeal erythema. No peritonsillar mass. No intraoral injuries   Neck:   No stridor. No SubQ emphysema.  No meningismus. Hematological/Lymphatic/Immunilogical:   No  cervical lymphadenopathy. Cardiovascular:   RRR. Symmetric bilateral radial and DP pulses.  No murmurs.  Respiratory:   Normal respiratory effort without tachypnea nor retractions. Breath sounds are clear and equal bilaterally. No wheezes/rales/rhonchi. Gastrointestinal:   Soft and nontender. Non distended. There is no CVA tenderness.  No rebound, rigidity, or guarding. Genitourinary:   deferred Musculoskeletal:   Nontender with normal range of motion in all extremities. No joint effusions.  No lower extremity tenderness.  No edema. Neurologic:   Normal speech and language.  CN 2-10 normal. Motor grossly intact. Normal gait Pronator drift negative, finger to nose normal No gross focal neurologic deficits are appreciated.  Skin:    Skin is warm, dry and intact. No rash noted.  No petechiae, purpura, or bullae.  ____________________________________________    LABS (pertinent positives/negatives) (all labs ordered are listed, but only abnormal results are displayed) Labs Reviewed  GLUCOSE, CAPILLARY - Abnormal; Notable for the following:    Glucose-Capillary 107 (*)    All other components within normal limits  BASIC METABOLIC PANEL - Abnormal; Notable for the following:    Glucose, Bld 110 (*)    All other components within normal limits  URINALYSIS COMPLETEWITH MICROSCOPIC ([facility] ONLY) - Abnormal; Notable for the following:    Color, Urine STRAW (*)    APPearance CLEAR (*)    Leukocytes, UA 2+ (*)    Squamous Epithelial / LPF 0-5 (*)    All other components within normal limits  GLUCOSE, CAPILLARY - Abnormal; Notable for the following:    Glucose-Capillary 101 (*)    All other components within normal limits  CBC  CBG MONITORING, ED   ____________________________________________   EKG  Interpreted by me Normal sinus rhythm rate of 75, normal axis and intervals. Normal QRS ST segments and T  waves.  ____________________________________________    RADIOLOGY  CT head unremarkable except for scalp hematoma.  ____________________________________________   PROCEDURES   ____________________________________________   INITIAL IMPRESSION / ASSESSMENT AND PLAN / ED COURSE  Pertinent labs & imaging results that were available during my care of the patient were reviewed by me and considered in my medical decision making (see chart for details).  Patient well appearing no acute distress. Not clinically dehydrated. Lab evaluation and CT head unremarkable. Patient does still report that she feels very unsteady and somewhat confused. Possibly related to concussion, but she does have risk factors for stroke including positive family history, hyperlipidemia, and hypertension. Recommended to the patient that she stay in the hospital for additional stroke workup, but she reports concern about cost and insurance coverage for a period requested the patient and took specialist to discuss these aspects of the Berneda Rose this with the patient so she can make that decision that makes the most sense for her. We'll follow up with the patient's trace, if she does not want to be hospitalized at this time I did advise her that she should call her primary care doctor tomorrow for an urgent visit for further stroke risk stratification.     ____________________________________________   FINAL CLINICAL IMPRESSION(S) / ED DIAGNOSES  Final diagnoses:  Syncope, unspecified syncope type  Scalp hematoma, initial encounter       Portions of this note were generated with dragon dictation software. Dictation errors may occur despite best attempts at proofreading.   Carrie Mew, MD 02/23/16 [phone]

## 2016-02-23 NOTE — ED Notes (Signed)
Had an episode in church of not knowing where she was. Then after church had an episode of loc and awoke on the asphalt. Drove home confused. Family brought her in

## 2016-02-23 NOTE — H&P (Signed)
[facility] NAME: Elizabeth Malone    MR#:  [id number]  DATE OF BIRTH:  1951-08-12  DATE OF ADMISSION:  02/23/2016  PRIMARY CARE PHYSICIAN: Golden Pop, MD   REQUESTING/REFERRING PHYSICIAN: Corky Downs  CHIEF COMPLAINT:   Chief Complaint  Patient presents with  . Loss of Consciousness    HISTORY OF PRESENT ILLNESS: Elizabeth Malone  is a 65 y.o. female with a known history of Chronic kidney disease, anxiety, depression, bipolar disorder, diverticulosis- feeling more shaky and jittery for last 2 days, she initially thought maybe it is because she did not sleep enough and had 12 hours sleep on the previous night off yesterday. Today morning she went to church, she was also feeling somewhat nauseated this morning. She was feeling a little bit shaky in the church and after finishing her services she'll left and while in the parking lot, she passed out and fell on the floor and hit her face on the ground. She was  Alone ON THAT SIDE OF THE PARKING LOT SO NOBODY SAW HER. After she regained her consciousness she quickly got up coding her car and came home and talk to her husband about this episode. She did not remember any further details and she also was confused about all the episode and everything as per the husband. Concerned with this husband and daughter insisted her to come to emergency room and she came here. CT scan of the head is negative for any fracture or intracranial abnormalities, but because of the significant episode ER physician suggested to admit to do a stroke workup and monitor on telemetry. Patient denies any urinary symptoms, any numbness or headache associated with the symptoms. She denies any recent changes in her medications.  PAST MEDICAL HISTORY:   Past Medical History  Diagnosis Date  . CKD (chronic kidney disease) stage 3, GFR 30-59 ml/min   . Anxiety   . Depression   . Bipolar disorder ([place])   . Complication of anesthesia    . PONV (postoperative nausea and vomiting)   . Substance abuse     alcohol  . History of diverticulosis   . Overactive bladder     PAST SURGICAL HISTORY: Past Surgical History  Procedure Laterality Date  . Foot surgery Bilateral   . Knee surgery Left   . Tonsillectomy and adenoidectomy      SOCIAL HISTORY:  Social History  Substance Use Topics  . Smoking status: Former Smoker    Types: Cigarettes    Start date: 07/09/1967    Quit date: 07/08/1985  . Smokeless tobacco: Never Used  . Alcohol Use: 1.8 oz/week    0 Glasses of wine, 0 Cans of beer, 3 Shots of liquor, 0 Standard drinks or equivalent per week    FAMILY HISTORY:  Family History  Problem Relation Age of Onset  . Alzheimer's disease Mother   . Bipolar disorder Mother   . Depression Mother   . Heart attack Father   . Diabetes Brother   . Obesity Brother   . Depression Maternal Grandfather   . Depression Daughter   . Breast cancer Neg Hx     DRUG ALLERGIES:  Allergies  Allergen Reactions  . Ace Inhibitors Other (See Comments)  . Fluoxetine Other (See Comments)  . Hydrocodone-Chlorpheniramine Other (See Comments)  . Paroxetine Hcl Other (See Comments)  . Penicillin G Benzathine Other (See Comments)  . Penicillin V Potassium Other (See Comments)  . Clindamycin Hcl Rash and  Other (See Comments)    Katherina Right' syndrome  . Lamotrigine Rash    REVIEW OF SYSTEMS:   CONSTITUTIONAL: No fever, fatigue or weakness.  EYES: No blurred or double vision.  EARS, NOSE, AND THROAT: No tinnitus or ear pain.  RESPIRATORY: No cough, shortness of breath, wheezing or hemoptysis.  CARDIOVASCULAR: No chest pain, orthopnea, edema.  GASTROINTESTINAL: No nausea, vomiting, diarrhea or abdominal pain.  GENITOURINARY: No dysuria, hematuria.  ENDOCRINE: No polyuria, nocturia,  HEMATOLOGY: No anemia, easy bruising or bleeding SKIN: No rash or lesion. MUSCULOSKELETAL: No joint pain or arthritis.   NEUROLOGIC: No tingling,  numbness, weakness. Had syncopal episode.  PSYCHIATRY: No anxiety or depression.   MEDICATIONS AT HOME:  Prior to Admission medications   Medication Sig Start Date End Date Taking? Authorizing Provider  aspirin EC 81 MG tablet Take by mouth.    Historical Provider, MD  atorvastatin (LIPITOR) 20 MG tablet Take 1 tablet (20 mg total) by mouth daily at 6 PM. 08/05/15   Guadalupe Maple, MD  benazepril (LOTENSIN) 5 MG tablet Take 1 tablet (5 mg total) by mouth daily. 09/27/15   Megan P Johnson, DO  busPIRone (BUSPAR) 15 MG tablet Take 1 tablet (15 mg total) by mouth 3 (three) times daily. Pt has supply 02/20/16   Rainey Pines, MD  Calcium Citrate-Vitamin D (CALCIUM + D PO) Take by mouth 3 (three) times daily.    Historical Provider, MD  gabapentin (NEURONTIN) 300 MG capsule Take 1 capsule (300 mg total) by mouth 2 (two) times daily. 11/21/15   Rainey Pines, MD  lithium 300 MG tablet Take 1 tablet (300 mg total) by mouth 3 (three) times daily. 11/21/15   Rainey Pines, MD  metoprolol succinate (TOPROL-XL) 25 MG 24 hr tablet Take 1 tablet (25 mg total) by mouth daily. 08/07/15   Guadalupe Maple, MD  Omega-3 Fatty Acids (FISH OIL) 1000 MG CAPS Take by mouth.    Historical Provider, MD  QUEtiapine (SEROQUEL) 400 MG tablet Take 1 tablet (400 mg total) by mouth at bedtime. 11/21/15   Rainey Pines, MD  topiramate (TOPAMAX) 50 MG tablet Take 1 tablet (50 mg total) by mouth daily. 11/21/15   Rainey Pines, MD      PHYSICAL EXAMINATION:   VITAL SIGNS: Blood pressure 117/84, pulse 63, temperature 98.1 F (36.7 C), resp. rate 16, height 5\' 5"  (1.651 m), weight 89.812 kg (198 lb), SpO2 97 %.  GENERAL:  65 y.o.-year-old patient lying in the bed with no acute distress.  EYES: Pupils equal, round, reactive to light and accommodation. No scleral icterus. Extraocular muscles intact.  HEENT: Frontal area on the head has laceration, normocephalic. Oropharynx and nasopharynx clear.  NECK:  Supple, no jugular venous distention. No  thyroid enlargement, no tenderness.  LUNGS: Normal breath sounds bilaterally, no wheezing, rales,rhonchi or crepitation. No use of accessory muscles of respiration.  CARDIOVASCULAR: S1, S2 normal. No murmurs, rubs, or gallops.  ABDOMEN: Soft, nontender, nondistended. Bowel sounds present. No organomegaly or mass.  EXTREMITIES: No pedal edema, cyanosis, or clubbing.  NEUROLOGIC: Cranial nerves II through XII are intact. Muscle strength 5/5 in all extremities. Sensation intact. Gait not checked.  PSYCHIATRIC: The patient is alert and oriented x 3.  SKIN: No obvious rash, lesion, or ulcer.   LABORATORY PANEL:   CBC  Recent Labs Lab 02/23/16 1344  WBC 8.9  HGB 14.6  HCT 43.1  PLT 215  MCV 92.4  MCH 31.3  MCHC 33.9  RDW 13.2   ------------------------------------------------------------------------------------------------------------------  Chemistries   Recent Labs Lab 02/23/16 1344  NA 137  K 4.0  CL 105  CO2 24  GLUCOSE 110*  BUN 19  CREATININE 0.90  CALCIUM 10.0   ------------------------------------------------------------------------------------------------------------------ estimated creatinine clearance is 69.9 mL/min (by C-G formula based on Cr of 0.9). ------------------------------------------------------------------------------------------------------------------ No results for input(s): TSH, T4TOTAL, T3FREE, THYROIDAB in the last 72 hours.  Invalid input(s): FREET3   Coagulation profile No results for input(s): INR, PROTIME in the last 168 hours. ------------------------------------------------------------------------------------------------------------------- No results for input(s): DDIMER in the last 72 hours. -------------------------------------------------------------------------------------------------------------------  Cardiac Enzymes No results for input(s): CKMB, TROPONINI, MYOGLOBIN in the last 168 hours.  Invalid input(s):  CK ------------------------------------------------------------------------------------------------------------------ Invalid input(s): POCBNP  ---------------------------------------------------------------------------------------------------------------  Urinalysis    Component Value Date/Time   COLORURINE STRAW* 02/23/2016 1344   APPEARANCEUR CLEAR* 02/23/2016 1344   LABSPEC 1.006 02/23/2016 1344   PHURINE 6.0 02/23/2016 1344   GLUCOSEU NEGATIVE 02/23/2016 1344   HGBUR NEGATIVE 02/23/2016 1344   BILIRUBINUR NEGATIVE 02/23/2016 1344   KETONESUR NEGATIVE 02/23/2016 1344   PROTEINUR NEGATIVE 02/23/2016 1344   NITRITE NEGATIVE 02/23/2016 1344   LEUKOCYTESUR 2+* 02/23/2016 1344     RADIOLOGY: Ct Head Wo Contrast  02/23/2016  CLINICAL DATA:  Altered mental status. Syncopal episode resulting in a fall on asphalt. EXAM: CT HEAD WITHOUT CONTRAST TECHNIQUE: Contiguous axial images were obtained from the base of the skull through the vertex without intravenous contrast. COMPARISON:  None. FINDINGS: Frontal scalp hematoma. Normal appearing cerebral hemispheres and posterior fossa structures. Normal size and position of the ventricles. No skull fracture, intracranial hemorrhage, mass lesion, CT evidence of acute infarction or paranasal sinus air-fluid levels. IMPRESSION: Frontal scalp hematoma without skull fracture, intracranial hemorrhage or other intracranial abnormality. Electronically Signed   By: Claudie Revering M.D.   On: 02/23/2016 13:24    EKG: Orders placed or performed during the hospital encounter of 02/23/16  . ED EKG  . ED EKG  . EKG 12-Lead  . EKG 12-Lead    IMPRESSION AND PLAN: * Syncopal episode    Most likely it would be a TIA episode, as patient is also complaining of feeling jittery and shaky for last 2 days I would check a lithium level.    We'll do an MRI of the brain, echocardiogram, carotid Doppler study, check lipid panel and hemoglobin A1c.    Monitor on telemetry  to watch for any cardiac arrhythmias.   * Bipolar disorder    Currently continue psychiatric medications as home dose, if the CK level comes IV need to hold it and call psych consult to the adjusted dosing.   * Hypertension    Continue home medications.    Check orthostatic vitals.   * Hyperlipidemia    Continue lovaza.  All the records are reviewed and case discussed with ED provider. Management plans discussed with the patient, family and they are in agreement.  CODE STATUS: Full code Code Status History    This patient does not have a recorded code status. Please follow your organizational policy for patients in this situation.       TOTAL TIME TAKING CARE OF THIS PATIENT: 50 minutes.    Vaughan Basta M.D on 02/23/2016   Between 7am to 6pm - Pager - [phone]  After 6pm go to [email or web address] - password [email or web address]  Office  [phone]  CC: Primary care physician; Golden Pop, MD   Note: This dictation was prepared with Dragon dictation along with smaller phrase technology. Any transcriptional  errors that result from this process are unintentional.

## 2016-02-24 ENCOUNTER — Observation Stay: Payer: BC Managed Care – PPO

## 2016-02-24 ENCOUNTER — Observation Stay
Admit: 2016-02-24 | Discharge: 2016-02-24 | Disposition: A | Discharge status: Home / Self Care | Payer: BC Managed Care – PPO | Attending: Internal Medicine | Admitting: Internal Medicine

## 2016-02-24 DIAGNOSIS — R55 Syncope and collapse: Secondary | ICD-10-CM

## 2016-02-24 LAB — TSH: TSH: 6.184 u[IU]/mL — ABNORMAL HIGH (ref 0.350–4.500)

## 2016-02-24 LAB — ECHOCARDIOGRAM COMPLETE
CHL CUP MV DEC (S): 232
EERAT: 8.5
EWDT: 232 ms
FS: 36 % (ref 28–44)
HEIGHTINCHES: 65 in
IV/PV OW: 1.21
LA ID, A-P, ES: 39 mm
LA vol index: 38.4 mL/m2
LA vol: 79.4 mL
LADIAMINDEX: 1.89 cm/m2
LAVOLA4C: 66.8 mL
LEFT ATRIUM END SYS DIAM: 39 mm
LV E/e' medial: 8.5
LV PW d: 9.6 mm — AB (ref 0.6–1.1)
LV TDI E'LATERAL: 8.92
LV e' LATERAL: 8.92 cm/s
LVEEAVG: 8.5
MV Peak grad: 2 mmHg
MVPKAVEL: 64.3 m/s
MVPKEVEL: 75.8 m/s
RV LATERAL S' VELOCITY: 15.6 cm/s
TAPSE: 23.5 mm
TDI e' medial: 6.42
WEIGHTICAEL: 3180.8 [oz_av]

## 2016-02-24 LAB — CBC
HEMATOCRIT: 36.7 % (ref 35.0–47.0)
HEMOGLOBIN: 12.6 g/dL (ref 12.0–16.0)
MCH: 32.1 pg (ref 26.0–34.0)
MCHC: 34.4 g/dL (ref 32.0–36.0)
MCV: 93.4 fL (ref 80.0–100.0)
Platelets: 197 10*3/uL (ref 150–440)
RBC: 3.93 MIL/uL (ref 3.80–5.20)
RDW: 13.1 % (ref 11.5–14.5)
WBC: 8.5 10*3/uL (ref 3.6–11.0)

## 2016-02-24 LAB — BASIC METABOLIC PANEL
ANION GAP: 6 (ref 5–15)
BUN: 17 mg/dL (ref 6–20)
CHLORIDE: 108 mmol/L (ref 101–111)
CO2: 24 mmol/L (ref 22–32)
Calcium: 9.6 mg/dL (ref 8.9–10.3)
Creatinine, Ser: 0.81 mg/dL (ref 0.44–1.00)
GFR calc Af Amer: >60 mL/min (ref >60)
GLUCOSE: 97 mg/dL (ref 65–99)
POTASSIUM: 3.5 mmol/L (ref 3.5–5.1)
SODIUM: 138 mmol/L (ref 135–145)

## 2016-02-24 LAB — HEMOGLOBIN A1C: Hgb A1c MFr Bld: 5 % (ref 4.0–6.0)

## 2016-02-24 MED ORDER — BACITRACIN-NEOMYCIN-POLYMYXIN 400-5-5000 EX OINT
TOPICAL_OINTMENT | Freq: Two times a day (BID) | CUTANEOUS | Status: DC
  Administered 2016-02-24: 22:00:00 via TOPICAL
  Administered 2016-02-24 – 2016-02-25 (×2): 1 via TOPICAL
  Filled 2016-02-24 (×3): qty 1

## 2016-02-24 MED ORDER — ACETAMINOPHEN 325 MG PO TABS
650.0000 mg | ORAL_TABLET | Freq: Four times a day (QID) | ORAL | Status: DC | PRN
  Administered 2016-02-24 (×2): 650 mg via ORAL
  Filled 2016-02-24: qty 2

## 2016-02-24 MED ORDER — IBUPROFEN 400 MG PO TABS
400.0000 mg | ORAL_TABLET | Freq: Four times a day (QID) | ORAL | Status: DC | PRN
  Administered 2016-02-24: 400 mg via ORAL
  Filled 2016-02-24: qty 1

## 2016-02-24 NOTE — Progress Notes (Signed)
Patient requested neosporin and medication for soreness, Dr. Vianne Bulls notified and received order for ibuprofen and neosporin. Orders placed. Wilnette Kales

## 2016-02-24 NOTE — Care Management (Addendum)
Placed in observation for syncope which resulted in a fall.  Independent in all adls.  No physical therapy needs.   No discharge concerns identified by care team

## 2016-02-24 NOTE — Consult Note (Signed)
Reason for Consult:Syncope Referring Physician: Vianne Bulls  CC: Syncope  HPI: Elizabeth Malone is an 65 y.o. female who reports that yesterday in church she felt nauseous.  Patient then recalls remembering that she did not know where she was or how to get home.  She went to her car after church and the next thing she remembers she was getting up off the concrete.  She does not know how she fell.  She was able to drive home but was amnestic of many of the events once she got home.  She reported that she felt that she tripped over her pants.  Her family does not believe that his what happened.  She remains unable to remember some things but her memory of events has clearly improved since yesterday. Family was finally able to convince the patient to come to the hospital.   Patient reports that on Friday she started to have a tremor and over the weekend her tremor worsened and she was uncoordinated per her husband.  This is improved at this time.    Past Medical History  Diagnosis Date  . CKD (chronic kidney disease) stage 3, GFR 30-59 ml/min   . Anxiety   . Depression   . Bipolar disorder ([place])   . Complication of anesthesia   . PONV (postoperative nausea and vomiting)   . Substance abuse     alcohol  . History of diverticulosis   . Overactive bladder     Past Surgical History  Procedure Laterality Date  . Foot surgery Bilateral   . Knee surgery Left   . Tonsillectomy and adenoidectomy      Family History  Problem Relation Age of Onset  . Alzheimer's disease Mother   . Bipolar disorder Mother   . Depression Mother   . Heart attack Father   . Diabetes Brother   . Obesity Brother   . Depression Maternal Grandfather   . Depression Daughter   . Breast cancer Neg Hx     Social History:  reports that she quit smoking about 30 years ago. Her smoking use included Cigarettes. She started smoking about 48 years ago. She has never used smokeless tobacco. She reports that she drinks about 1.8  oz of alcohol per week. She reports that she does not use illicit drugs.  Allergies  Allergen Reactions  . Ace Inhibitors Other (See Comments)  . Fluoxetine Other (See Comments)  . Hydrocodone-Chlorpheniramine Other (See Comments)  . Paroxetine Hcl Other (See Comments)  . Penicillin G Benzathine Other (See Comments)  . Penicillin V Potassium Other (See Comments)  . Clindamycin Hcl Rash and Other (See Comments)    Katherina Right' syndrome  . Lamotrigine Rash    Medications:  I have reviewed the patient's current medications. Prior to Admission:  Prescriptions prior to admission  Medication Sig Dispense Refill Last Dose  . aspirin EC 81 MG tablet Take 81 mg by mouth at bedtime.    02/22/2016 at Unknown time  . atorvastatin (LIPITOR) 20 MG tablet Take 1 tablet (20 mg total) by mouth daily at 6 PM. (Patient taking differently: Take 20 mg by mouth every morning. ) 90 tablet 4 02/23/2016 at Unknown time  . benazepril (LOTENSIN) 5 MG tablet Take 1 tablet (5 mg total) by mouth daily. (Patient taking differently: Take 5 mg by mouth every morning. ) 90 tablet 4 02/23/2016 at Unknown time  . busPIRone (BUSPAR) 15 MG tablet Take 1 tablet (15 mg total) by mouth 3 (three) times daily.  Pt has supply 180 tablet 1 02/23/2016 at Unknown time  . Calcium Citrate-Vitamin D (CALCIUM + D PO) Take 1 tablet by mouth 2 (two) times daily.    02/23/2016 at Unknown time  . gabapentin (NEURONTIN) 300 MG capsule Take 1 capsule (300 mg total) by mouth 2 (two) times daily. 180 capsule 2 02/23/2016 at Unknown time  . lithium 300 MG tablet Take 300 mg by mouth Nightly.     . lithium carbonate 300 MG capsule Take 300-600 mg by mouth See admin instructions. Take 1 capsule by mouth in the morning and take 2 capsules (600 mg) by mouth every night at bedtime.     Marland Kitchen lithium carbonate 300 MG capsule Take 300 mg by mouth every morning.     . metoprolol succinate (TOPROL-XL) 25 MG 24 hr tablet Take 1 tablet (25 mg total) by mouth daily.  (Patient taking differently: Take 25 mg by mouth at bedtime. ) 90 tablet 4 02/22/2016 at 2130  . Omega-3 Fatty Acids (FISH OIL) 1000 MG CAPS Take 1,000 mg by mouth 2 (two) times daily.    02/23/2016 at Unknown time  . QUEtiapine (SEROQUEL) 400 MG tablet Take 1 tablet (400 mg total) by mouth at bedtime. 90 tablet 2 02/22/2016 at Unknown time  . topiramate (TOPAMAX) 50 MG tablet Take 1 tablet (50 mg total) by mouth daily. (Patient taking differently: Take 50 mg by mouth every evening. ) 90 tablet 1 02/22/2016 at Unknown time   Scheduled: . acetaminophen  1,000 mg Oral Once  . aspirin  325 mg Oral Daily  . atorvastatin  20 mg Oral q1800  . benazepril  5 mg Oral Daily  . busPIRone  15 mg Oral TID  . calcium-vitamin D  1 tablet Oral BID  . gabapentin  300 mg Oral BID  . heparin  5,000 Units Subcutaneous Q8H  . lithium carbonate  300 mg Oral q morning - 10a  . lithium carbonate  600 mg Oral QHS  . metoprolol succinate  25 mg Oral Daily  . omega-3 acid ethyl esters  1 g Oral Daily  . QUEtiapine  400 mg Oral QHS  . sodium chloride flush  3 mL Intravenous Q12H  . topiramate  50 mg Oral Daily    ROS: History obtained from the patient  General ROS: negative for - chills, fatigue, fever, night sweats, weight gain or weight loss Psychological ROS: negative for - behavioral disorder, hallucinations, memory difficulties, mood swings or suicidal ideation Ophthalmic ROS: negative for - blurry vision, double vision, eye pain or loss of vision ENT ROS: negative for - epistaxis, nasal discharge, oral lesions, sore throat, tinnitus or vertigo Allergy and Immunology ROS: negative for - hives or itchy/watery eyes Hematological and Lymphatic ROS: negative for - bleeding problems, bruising or swollen lymph nodes Endocrine ROS: negative for - galactorrhea, hair pattern changes, polydipsia/polyuria or temperature intolerance Respiratory ROS: negative for - cough, hemoptysis, shortness of breath or  wheezing Cardiovascular ROS: negative for - chest pain, dyspnea on exertion, edema or irregular heartbeat Gastrointestinal ROS: negative for - abdominal pain, diarrhea, hematemesis, nausea/vomiting or stool incontinence Genito-Urinary ROS: negative for - dysuria, hematuria, incontinence or urinary frequency/urgency Musculoskeletal ROS: bilateral hip pain Neurological ROS: as noted in HPI Dermatological ROS: negative for rash and skin lesion changes  Physical Examination: Blood pressure 158/131, pulse 59, temperature 98.1 F (36.7 C), temperature source Oral, resp. rate 19, height 5\' 5"  (1.651 m), weight 90.175 kg (198 lb 12.8 oz), SpO2 98 %.  HEENT-  Normocephalic.  Abrasion on forehead.  Raccoon eyes.  Normal conjunctiva.  Normal TM's bilaterally.  Normal auditory canals and external ears. Normal external nose, mucus membranes and septum.  Normal pharynx. Cardiovascular- S1, S2 normal, pulses palpable throughout   Lungs- chest clear, no wheezing, rales, normal symmetric air entry Abdomen- soft, non-tender; bowel sounds normal; no masses,  no organomegaly Extremities- no edema Lymph-no adenopathy palpable Musculoskeletal-no joint tenderness, deformity or swelling Skin-bruising on toes bilaterally  Neurological Examination Mental Status: Alert, oriented, thought content appropriate.  Speech fluent without evidence of aphasia.  Able to follow 3 step commands without difficulty. Cranial Nerves: II: Discs flat bilaterally; Visual fields grossly normal, pupils equal, round, reactive to light and accommodation III,IV, VI: ptosis not present, extra-ocular motions intact bilaterally V,VII: smile symmetric, facial light touch sensation normal bilaterally VIII: hearing normal bilaterally IX,X: gag reflex present XI: bilateral shoulder shrug XII: midline tongue extension Motor: Right : Upper extremity   5/5    Left:     Upper extremity   5/5  Lower extremity   5/5     Lower extremity    5/5 Tone and bulk:normal tone throughout; no atrophy noted Sensory: Pinprick and light touch intact throughout, bilaterally Deep Tendon Reflexes: 1+ and symmetric with absent AJ's bilaterally Plantars: Right: downgoing   Left: downgoing Cerebellar: Normal finger-to-nose and normal heel-to-shin testing bilaterally Gait: not tested due to safety concerns   Laboratory Studies:   Basic Metabolic Panel:  Recent Labs Lab 02/23/16 1344 02/24/16 0257  NA 137 138  K 4.0 3.5  CL 105 108  CO2 24 24  GLUCOSE 110* 97  BUN 19 17  CREATININE 0.90 0.81  CALCIUM 10.0 9.6    Liver Function Tests: No results for input(s): AST, ALT, ALKPHOS, BILITOT, PROT, ALBUMIN in the last 168 hours. No results for input(s): LIPASE, AMYLASE in the last 168 hours. No results for input(s): AMMONIA in the last 168 hours.  CBC:  Recent Labs Lab 02/23/16 1344 02/24/16 0257  WBC 8.9 8.5  HGB 14.6 12.6  HCT 43.1 36.7  MCV 92.4 93.4  PLT 215 197    Cardiac Enzymes: No results for input(s): CKTOTAL, CKMB, CKMBINDEX, TROPONINI in the last 168 hours.  BNP: Invalid input(s): POCBNP  CBG:  Recent Labs Lab 02/23/16 1306 02/23/16 1405  GLUCAP 107* 101*    Microbiology: No results found for this or any previous visit.  Coagulation Studies: No results for input(s): LABPROT, INR in the last 72 hours.  Urinalysis:  Recent Labs Lab 02/23/16 1344  COLORURINE STRAW*  LABSPEC 1.006  PHURINE 6.0  GLUCOSEU NEGATIVE  HGBUR NEGATIVE  BILIRUBINUR NEGATIVE  KETONESUR NEGATIVE  PROTEINUR NEGATIVE  NITRITE NEGATIVE  LEUKOCYTESUR 2+*    Lipid Panel:     Component Value Date/Time   CHOL 209* 02/23/2016 1344   CHOL 159 02/04/2016 1122   TRIG 301* 02/23/2016 1344   HDL 57 02/23/2016 1344   HDL 58 02/04/2016 1122   CHOLHDL 3.7 02/23/2016 1344   CHOLHDL 2.7 02/04/2016 1122   VLDL 60* 02/23/2016 1344   LDLCALC 92 02/23/2016 1344   LDLCALC 74 02/04/2016 1122    HgbA1C: No results found for:  HGBA1C  Urine Drug Screen:  No results found for: LABOPIA, COCAINSCRNUR, LABBENZ, AMPHETMU, THCU, LABBARB  Alcohol Level: No results for input(s): ETH in the last 168 hours.  Other results: EKG: sinus rhythm at 75 bpm.  Imaging: Ct Head Wo Contrast  02/23/2016  CLINICAL DATA:  Altered mental status.  Syncopal episode resulting in a fall on asphalt. EXAM: CT HEAD WITHOUT CONTRAST TECHNIQUE: Contiguous axial images were obtained from the base of the skull through the vertex without intravenous contrast. COMPARISON:  None. FINDINGS: Frontal scalp hematoma. Normal appearing cerebral hemispheres and posterior fossa structures. Normal size and position of the ventricles. No skull fracture, intracranial hemorrhage, mass lesion, CT evidence of acute infarction or paranasal sinus air-fluid levels. IMPRESSION: Frontal scalp hematoma without skull fracture, intracranial hemorrhage or other intracranial abnormality. Electronically Signed   By: Claudie Revering M.D.   On: 02/23/2016 13:24   Mr Brain Wo Contrast  02/24/2016  CLINICAL DATA:  Patient fell and hit head yesterday. Initial encounter. EXAM: MRI HEAD WITHOUT CONTRAST TECHNIQUE: Multiplanar, multiecho pulse sequences of the brain and surrounding structures were obtained without intravenous contrast. COMPARISON:  Head CT 02/23/2016 FINDINGS: There is no evidence of acute infarct, intracranial hemorrhage, mass, midline shift, or extra-axial fluid collection. Ventricles and sulci are normal. No significant cerebral white matter disease is seen. Orbits are unremarkable. A midline frontal scalp hematoma is again noted. There is trace left maxillary sinus fluid. The mastoid air cells are clear. Major intracranial vascular flow voids are preserved. IMPRESSION: Unremarkable appearance of the brain for age. Electronically Signed   By: Logan Bores M.D.   On: 02/24/2016 09:24   US Carotid Bilateral  02/23/2016  CLINICAL DATA:  Status post recent CVA.  Initial encounter.  EXAM: BILATERAL CAROTID DUPLEX ULTRASOUND TECHNIQUE: Pearline Cables scale imaging, color Doppler and duplex ultrasound were performed of bilateral carotid and vertebral arteries in the neck. COMPARISON:  None. FINDINGS: Criteria: Quantification of carotid stenosis is based on velocity parameters that correlate the residual internal carotid diameter with NASCET-based stenosis levels, using the diameter of the distal internal carotid lumen as the denominator for stenosis measurement. The following velocity measurements were obtained: RIGHT ICA:  86 cm/sec / 30 cm/sec CCA:  98 cm/sec / 26 cm/sec SYSTOLIC ICA/CCA RATIO:  0.9 DIASTOLIC ICA/CCA RATIO:  1.1 ECA:  80 cm/sec LEFT ICA:  86 cm/sec / 30 cm/sec CCA:  111 cm/sec / 35 cm/sec SYSTOLIC ICA/CCA RATIO:  0.8 DIASTOLIC ICA/CCA RATIO:  0.9 ECA:  107 cm/sec RIGHT CAROTID ARTERY: Trace plaque is noted at the right carotid bulb, without significant luminal narrowing. RIGHT VERTEBRAL ARTERY:  Normal antegrade flow noted. LEFT CAROTID ARTERY: Trace plaque is noted at the left carotid bulb, without significant luminal narrowing. LEFT VERTEBRAL ARTERY:  Normal antegrade flow noted. IMPRESSION: No significant calcific atherosclerotic disease seen. No evidence of luminal narrowing. Normal velocity measurements noted bilaterally, with normal antegrade flow at both vertebral arteries. Electronically Signed   By: Garald Balding M.D.   On: 02/23/2016 18:51     Assessment/Plan: 65 year old female presenting after a syncopal episode.  There were no witnesses.  It is unclear exactly what happened.  Neurological examination is unremarkable at this time.  MRI of the brain personally reviewed and shows no acute changes.  Echocardiogram shows no hemodynamically significant stenosis.    Recommendations: 1.  EEG.  Anticonvulsant therapy not indicated unless abnormalities noted suggestive of an epileptic focus.   2.  TSH 3.  Patient to follow up with neurology on an outpatient basis.     Alexis Goodell, MD Neurology [phone] 02/24/2016, 2:24 PM

## 2016-02-24 NOTE — Progress Notes (Signed)
PT Cancellation Note  Patient Details Name: Elizabeth Malone MRN: [id number] DOB: 27-Oct-1950   Cancelled Treatment:    Reason Eval/Treat Not Completed: PT screened, no needs identified, will sign off. Pt heading down for MRI testing upon arrival for PT eval. Therapist observed pt walk in bathroom and out to bed for transport. Pt able to carry conversation and perform gait WNL without AD. Pt reports she is at her baseline status and does not require PT. Will complete orders. Please re-consult if needs change.   Elin Fenley 02/24/2016, 10:15 AM  Greggory Stallion, PT, DPT [phone]

## 2016-02-24 NOTE — Progress Notes (Signed)
[facility] [place] NAME: Elizabeth Malone    MR#:  [id number]  DATE OF BIRTH:  1951/06/11  SUBJECTIVE: Admitted for syncope with loss of consciousness. Patient admitted for TIA evaluation, stroke workup negative. Patient has been having shaking for the past 2 days and an episode of syncope and confusion yesterday. Concerned  about seizures.   CHIEF COMPLAINT:   Chief Complaint  Patient presents with  . Loss of Consciousness    REVIEW OF SYSTEMS:   lot of facial bruise present.. Review of Systems  Constitutional: Negative for fever and chills.  HENT: Negative for hearing loss.   Eyes: Negative for blurred vision, double vision and photophobia.  Respiratory: Negative for cough, hemoptysis and shortness of breath.   Cardiovascular: Negative for palpitations, orthopnea and leg swelling.  Gastrointestinal: Negative for vomiting, abdominal pain and diarrhea.  Genitourinary: Negative for dysuria and urgency.  Musculoskeletal: Negative for myalgias and neck pain.  Skin: Negative for rash.  Neurological: Negative for dizziness, focal weakness, seizures, weakness and headaches.  Psychiatric/Behavioral: Negative for memory loss. The patient does not have insomnia.     Nutrition:  Tolerating Diet: Tolerating PT:      DRUG ALLERGIES:   Allergies  Allergen Reactions  . Ace Inhibitors Other (See Comments)  . Fluoxetine Other (See Comments)  . Hydrocodone-Chlorpheniramine Other (See Comments)  . Paroxetine Hcl Other (See Comments)  . Penicillin G Benzathine Other (See Comments)  . Penicillin V Potassium Other (See Comments)  . Clindamycin Hcl Rash and Other (See Comments)    Katherina Right' syndrome  . Lamotrigine Rash    VITALS:  Blood pressure 111/53, pulse 61, temperature 97.8 F (36.6 C), temperature source Oral, resp. rate 16, height 5\' 5"  (1.651 m), weight 90.175 kg (198 lb 12.8 oz), SpO2 99 %.  PHYSICAL EXAMINATION:    Physical Exam  GENERAL:  65 y.o.-year-old patient lying in the bed with no acute distress.  EYES: Pupils equal, round, reactive to light and accommodation. No scleral icterus. Extraocular muscles intact.  HEENT 6 cm scalp hematoma present over the mid forehead. Abrasions on face. NECK:  Supple, no jugular venous distention. No thyroid enlargement, no tenderness.  LUNGS: Normal breath sounds bilaterally, no wheezing, rales,rhonchi or crepitation. No use of accessory muscles of respiration.  CARDIOVASCULAR: S1, S2 normal. No murmurs, rubs, or gallops.  ABDOMEN: Soft, nontender, nondistended. Bowel sounds present. No organomegaly or mass.  EXTREMITIES: No pedal edema, cyanosis, or clubbing.  NEUROLOGIC: Cranial nerves II through XII are intact. Muscle strength 5/5 in all extremities. Sensation intact. Gait not checked.  PSYCHIATRIC: The patient is alert and oriented x 3.  SKIN: No obvious rash, lesion, or ulcer.    LABORATORY PANEL:   CBC  Recent Labs Lab 02/24/16 0257  WBC 8.5  HGB 12.6  HCT 36.7  PLT 197   ------------------------------------------------------------------------------------------------------------------  Chemistries   Recent Labs Lab 02/24/16 0257  NA 138  K 3.5  CL 108  CO2 24  GLUCOSE 97  BUN 17  CREATININE 0.81  CALCIUM 9.6   ------------------------------------------------------------------------------------------------------------------  Cardiac Enzymes No results for input(s): TROPONINI in the last 168 hours. ------------------------------------------------------------------------------------------------------------------  RADIOLOGY:  Ct Head Wo Contrast  02/23/2016  CLINICAL DATA:  Altered mental status. Syncopal episode resulting in a fall on asphalt. EXAM: CT HEAD WITHOUT CONTRAST TECHNIQUE: Contiguous axial images were obtained from the base of the skull through the vertex without intravenous contrast. COMPARISON:  None. FINDINGS:  Frontal scalp hematoma. Normal  appearing cerebral hemispheres and posterior fossa structures. Normal size and position of the ventricles. No skull fracture, intracranial hemorrhage, mass lesion, CT evidence of acute infarction or paranasal sinus air-fluid levels. IMPRESSION: Frontal scalp hematoma without skull fracture, intracranial hemorrhage or other intracranial abnormality. Electronically Signed   By: Claudie Revering M.D.   On: 02/23/2016 13:24   Mr Brain Wo Contrast  02/24/2016  CLINICAL DATA:  Patient fell and hit head yesterday. Initial encounter. EXAM: MRI HEAD WITHOUT CONTRAST TECHNIQUE: Multiplanar, multiecho pulse sequences of the brain and surrounding structures were obtained without intravenous contrast. COMPARISON:  Head CT 02/23/2016 FINDINGS: There is no evidence of acute infarct, intracranial hemorrhage, mass, midline shift, or extra-axial fluid collection. Ventricles and sulci are normal. No significant cerebral white matter disease is seen. Orbits are unremarkable. A midline frontal scalp hematoma is again noted. There is trace left maxillary sinus fluid. The mastoid air cells are clear. Major intracranial vascular flow voids are preserved. IMPRESSION: Unremarkable appearance of the brain for age. Electronically Signed   By: Logan Bores M.D.   On: 02/24/2016 09:24   US Carotid Bilateral  02/23/2016  CLINICAL DATA:  Status post recent CVA.  Initial encounter. EXAM: BILATERAL CAROTID DUPLEX ULTRASOUND TECHNIQUE: Pearline Cables scale imaging, color Doppler and duplex ultrasound were performed of bilateral carotid and vertebral arteries in the neck. COMPARISON:  None. FINDINGS: Criteria: Quantification of carotid stenosis is based on velocity parameters that correlate the residual internal carotid diameter with NASCET-based stenosis levels, using the diameter of the distal internal carotid lumen as the denominator for stenosis measurement. The following velocity measurements were obtained: RIGHT ICA:  86  cm/sec / 30 cm/sec CCA:  98 cm/sec / 26 cm/sec SYSTOLIC ICA/CCA RATIO:  0.9 DIASTOLIC ICA/CCA RATIO:  1.1 ECA:  80 cm/sec LEFT ICA:  86 cm/sec / 30 cm/sec CCA:  111 cm/sec / 35 cm/sec SYSTOLIC ICA/CCA RATIO:  0.8 DIASTOLIC ICA/CCA RATIO:  0.9 ECA:  107 cm/sec RIGHT CAROTID ARTERY: Trace plaque is noted at the right carotid bulb, without significant luminal narrowing. RIGHT VERTEBRAL ARTERY:  Normal antegrade flow noted. LEFT CAROTID ARTERY: Trace plaque is noted at the left carotid bulb, without significant luminal narrowing. LEFT VERTEBRAL ARTERY:  Normal antegrade flow noted. IMPRESSION: No significant calcific atherosclerotic disease seen. No evidence of luminal narrowing. Normal velocity measurements noted bilaterally, with normal antegrade flow at both vertebral arteries. Electronically Signed   By: Garald Balding M.D.   On: 02/23/2016 18:51     ASSESSMENT AND PLAN:   Principal Problem:   Syncope   #1 syncope with postictal state concerning for seizures: Obtain neurology consult;; having tremors for the last 2 days.  Lithium  levels are normal. TIA  Work up normal so far with  normal EKG. Ultrasound of carotids normal. MRI of the brain did not show any acute stroke. Follow up on echocardiogram results. Possible vasovagal state: Hyperlipidemia continue statins,  #2.Bipolar disorder: Continue psychiatric medications 3. Essential hypertension: Controlled. Discussed with pts daughter/ All the records are reviewed and case discussed with Care Management/Social Workerr. Management plans discussed with the patient, family and they are in agreement.  CODE STATUS: full  TOTAL TIME TAKING CARE OF THIS PATIENT: 35 minutes.   POSSIBLE D/C IN 1 day, DEPENDING ON CLINICAL CONDITION.   Epifanio Lesches M.D on 02/24/2016 at 11:26 AM  Between 7am to 6pm - Pager - [phone]  After 6pm go to [email or web address] - password [email or web address] [facility]  [facility]  Office  [phone]  CC: Primary  care physician; Golden Pop, MD

## 2016-02-25 DIAGNOSIS — R55 Syncope and collapse: Secondary | ICD-10-CM | POA: Diagnosis not present

## 2016-02-25 MED ORDER — BACITRACIN-NEOMYCIN-POLYMYXIN 400-5-5000 EX OINT: TOPICAL_OINTMENT | Freq: Two times a day (BID) | CUTANEOUS | Status: DC

## 2016-02-25 NOTE — Progress Notes (Signed)
Subjective: No further syncopal events.  Objective: Current vital signs: BP 106/88 mmHg  Pulse 62  Temp(Src) 97.7 F (36.5 C) (Oral)  Resp 18  Ht 5\' 5"  (1.651 m)  Wt 90.175 kg (198 lb 12.8 oz)  BMI 33.08 kg/m2  SpO2 98% Vital signs in last 24 hours: Temp:  [97.7 F (36.5 C)-98.5 F (36.9 C)] 97.7 F (36.5 C) (06/13 0443) Pulse Rate:  [54-69] 62 (06/13 0930) Resp:  [18] 18 (06/13 0443) BP: (101-141)/(54-88) 106/88 mmHg (06/13 0930) SpO2:  [98 %] 98 % (06/13 0443)  Intake/Output from previous day: 06/12 0701 - 06/13 0700 In: 600 [P.O.:600] Out: 4775 [Urine:4775] Intake/Output this shift: Total I/O In: 240 [P.O.:240] Out: 700 [Urine:700] Nutritional status: Diet Heart Room service appropriate?: Yes; Fluid consistency:: Thin  Neurologic Exam: Mental Status: Alert, oriented, thought content appropriate. Speech fluent without evidence of aphasia. Able to follow 3 step commands without difficulty. Cranial Nerves: II: Discs flat bilaterally; Visual fields grossly normal, pupils equal, round, reactive to light and accommodation III,IV, VI: ptosis not present, extra-ocular motions intact bilaterally V,VII: smile symmetric, facial light touch sensation normal bilaterally VIII: hearing normal bilaterally IX,X: gag reflex present XI: bilateral shoulder shrug XII: midline tongue extension Motor: Right :Upper extremity 5/5Left: Upper extremity 5/5 Lower extremity 5/5Lower extremity 5/5 Tone and bulk:normal tone throughout; no atrophy noted Sensory: Pinprick and light touch intact throughout, bilaterally  Lab Results: Basic Metabolic Panel:  Recent Labs Lab 02/23/16 1344 02/24/16 0257  NA 137 138  K 4.0 3.5  CL 105 108  CO2 24 24  GLUCOSE 110* 97  BUN 19 17  CREATININE 0.90 0.81  CALCIUM 10.0 9.6    Liver Function Tests: No results for input(s): AST,  ALT, ALKPHOS, BILITOT, PROT, ALBUMIN in the last 168 hours. No results for input(s): LIPASE, AMYLASE in the last 168 hours. No results for input(s): AMMONIA in the last 168 hours.  CBC:  Recent Labs Lab 02/23/16 1344 02/24/16 0257  WBC 8.9 8.5  HGB 14.6 12.6  HCT 43.1 36.7  MCV 92.4 93.4  PLT 215 197    Cardiac Enzymes: No results for input(s): CKTOTAL, CKMB, CKMBINDEX, TROPONINI in the last 168 hours.  Lipid Panel:  Recent Labs Lab 02/23/16 1344  CHOL 209*  TRIG 301*  HDL 57  CHOLHDL 3.7  VLDL 60*  LDLCALC 92    CBG:  Recent Labs Lab 02/23/16 1306 02/23/16 1405  GLUCAP 107* 101*    Microbiology: No results found for this or any previous visit.  Coagulation Studies: No results for input(s): LABPROT, INR in the last 72 hours.  Imaging: Ct Head Wo Contrast  02/23/2016  CLINICAL DATA:  Altered mental status. Syncopal episode resulting in a fall on asphalt. EXAM: CT HEAD WITHOUT CONTRAST TECHNIQUE: Contiguous axial images were obtained from the base of the skull through the vertex without intravenous contrast. COMPARISON:  None. FINDINGS: Frontal scalp hematoma. Normal appearing cerebral hemispheres and posterior fossa structures. Normal size and position of the ventricles. No skull fracture, intracranial hemorrhage, mass lesion, CT evidence of acute infarction or paranasal sinus air-fluid levels. IMPRESSION: Frontal scalp hematoma without skull fracture, intracranial hemorrhage or other intracranial abnormality. Electronically Signed   By: Claudie Revering M.D.   On: 02/23/2016 13:24   Mr Brain Wo Contrast  02/24/2016  CLINICAL DATA:  Patient fell and hit head yesterday. Initial encounter. EXAM: MRI HEAD WITHOUT CONTRAST TECHNIQUE: Multiplanar, multiecho pulse sequences of the brain and surrounding structures were obtained without intravenous contrast. COMPARISON:  Head CT 02/23/2016 FINDINGS: There is no evidence of acute infarct, intracranial hemorrhage, mass, midline  shift, or extra-axial fluid collection. Ventricles and sulci are normal. No significant cerebral white matter disease is seen. Orbits are unremarkable. A midline frontal scalp hematoma is again noted. There is trace left maxillary sinus fluid. The mastoid air cells are clear. Major intracranial vascular flow voids are preserved. IMPRESSION: Unremarkable appearance of the brain for age. Electronically Signed   By: Logan Bores M.D.   On: 02/24/2016 09:24   US Carotid Bilateral  02/23/2016  CLINICAL DATA:  Status post recent CVA.  Initial encounter. EXAM: BILATERAL CAROTID DUPLEX ULTRASOUND TECHNIQUE: Pearline Cables scale imaging, color Doppler and duplex ultrasound were performed of bilateral carotid and vertebral arteries in the neck. COMPARISON:  None. FINDINGS: Criteria: Quantification of carotid stenosis is based on velocity parameters that correlate the residual internal carotid diameter with NASCET-based stenosis levels, using the diameter of the distal internal carotid lumen as the denominator for stenosis measurement. The following velocity measurements were obtained: RIGHT ICA:  86 cm/sec / 30 cm/sec CCA:  98 cm/sec / 26 cm/sec SYSTOLIC ICA/CCA RATIO:  0.9 DIASTOLIC ICA/CCA RATIO:  1.1 ECA:  80 cm/sec LEFT ICA:  86 cm/sec / 30 cm/sec CCA:  111 cm/sec / 35 cm/sec SYSTOLIC ICA/CCA RATIO:  0.8 DIASTOLIC ICA/CCA RATIO:  0.9 ECA:  107 cm/sec RIGHT CAROTID ARTERY: Trace plaque is noted at the right carotid bulb, without significant luminal narrowing. RIGHT VERTEBRAL ARTERY:  Normal antegrade flow noted. LEFT CAROTID ARTERY: Trace plaque is noted at the left carotid bulb, without significant luminal narrowing. LEFT VERTEBRAL ARTERY:  Normal antegrade flow noted. IMPRESSION: No significant calcific atherosclerotic disease seen. No evidence of luminal narrowing. Normal velocity measurements noted bilaterally, with normal antegrade flow at both vertebral arteries. Electronically Signed   By: Garald Balding M.D.   On:  02/23/2016 18:51    Medications:  I have reviewed the patient's current medications. Scheduled: . acetaminophen  1,000 mg Oral Once  . aspirin  325 mg Oral Daily  . atorvastatin  20 mg Oral q1800  . benazepril  5 mg Oral Daily  . busPIRone  15 mg Oral TID  . calcium-vitamin D  1 tablet Oral BID  . gabapentin  300 mg Oral BID  . heparin  5,000 Units Subcutaneous Q8H  . lithium carbonate  300 mg Oral q morning - 10a  . lithium carbonate  600 mg Oral QHS  . metoprolol succinate  25 mg Oral Daily  . neomycin-bacitracin-polymyxin   Topical BID  . omega-3 acid ethyl esters  1 g Oral Daily  . QUEtiapine  400 mg Oral QHS  . sodium chloride flush  3 mL Intravenous Q12H  . topiramate  50 mg Oral Daily    Assessment/Plan: No further syncopal events.  EEG unremarkable.  Unclear etiology of syncope.  Anticonvulsant therapy not indicated at this time.    Recommendations: 1.  Patient to follow up with neurology on an outpatient basis 2.  May benefit from cardiology evaluation on an outpatient basis as well for holter monitoring.      Alexis Goodell, MD Neurology [phone] 02/25/2016  12:00 PM

## 2016-02-25 NOTE — Progress Notes (Signed)
Patient d/c'd home. Education provided, no questions at this time. Patient picked up by husband. Telemetry removed. Lakeysha Slutsky R [place]  

## 2016-02-26 NOTE — Discharge Summary (Signed)
Elizabeth Malone, is a 65 y.o. female  DOB [birth year]  MRN [id number].  Admission date:  02/23/2016  Admitting Physician  Vaughan Basta, MD  Discharge Date:  02/25/2016   Primary MD  Golden Pop, MD  Recommendations for primary care physician for things to follow:   Discharge home and follow-up with primary   Admission Diagnosis  CVA (cerebral infarction) [I63.9] Scalp hematoma, initial encounter [S00.03XA] Syncope, unspecified syncope type [R55]   Discharge Diagnosis  CVA (cerebral infarction) [I63.9] Scalp hematoma, initial encounter [S00.03XA] Syncope, unspecified syncope type [R55]    Principal Problem:   Syncope Active Problems:   Faintness      Past Medical History  Diagnosis Date  . CKD (chronic kidney disease) stage 3, GFR 30-59 ml/min   . Anxiety   . Depression   . Bipolar disorder ([place])   . Complication of anesthesia   . PONV (postoperative nausea and vomiting)   . Substance abuse     alcohol  . History of diverticulosis   . Overactive bladder     Past Surgical History  Procedure Laterality Date  . Foot surgery Bilateral   . Knee surgery Left   . Tonsillectomy and adenoidectomy         History of present illness and  Hospital Course:     Kindly see H&P for history of present illness and admission details, please review complete Labs, Consult reports and Test reports for all details in brief  HPI  from the history and physical done on the day of admission 65 year old female patient came in because of loss of consciousness. Patient past medical history significant for bipolar disorder, depression, anxiety, chronic kidney disease stage III colon. Admitted for syncope. She passed out in parking lot and suffered facial bruise and scalp hematoma.   Hospital Course   1 syncope likely   Vasovagal;Ultrasound of carotids did not show any hemodynamically significant stenosis. MRI of the brain did not show any stroke. Admitted to telemetry, telemetry did not show any arrhythmias. His echocardiogram showed EF more than 65%. Patient has C seen by neurologist because of concern for possible seizure. She did have EEG which did not show any acute seizures and EEG is completely normal. Patient is discharged home after discussing this with neurology. And the patient was advised to follow with primary doctor.  2 subclinical hypothyroidism: Patient TSH is elevated to 6 but no history of hypothyroidism. But patient  Takes lithium and I told the patient that she can follow with primary doctor in 1 month and have repeat labs done. 3 bipolar disorder continue psychiatric medications. Essential hypertension controlled.  Discharge Condition: stable   Follow UP  Follow-up Information    Follow up with Golden Pop, MD. Go on 04/13/2016.   Specialty:  Family Medicine   Why:  at 1:30pm. NEEDS FOLLOW UP OF tsh and hypothyrodisim   Contact information:   [facility] [id number] [phone]         Discharge Instructions  and  Discharge Medications        Medication List    TAKE these medications        aspirin EC 81 MG tablet  Take 81 mg by mouth at bedtime.     atorvastatin 20 MG tablet  Commonly known as:  LIPITOR  Take 1 tablet (20 mg total) by mouth daily at 6 PM.     benazepril 5 MG tablet  Commonly known as:  LOTENSIN  Take  1 tablet (5 mg total) by mouth daily.     busPIRone 15 MG tablet  Commonly known as:  BUSPAR  Take 1 tablet (15 mg total) by mouth 3 (three) times daily. Pt has supply     CALCIUM + D PO  Take 1 tablet by mouth 2 (two) times daily.     Fish Oil 1000 MG Caps  Take 1,000 mg by mouth 2 (two) times daily.     gabapentin 300 MG capsule  Commonly known as:  NEURONTIN  Take 1 capsule (300 mg total) by mouth 2 (two) times daily.      lithium carbonate 300 MG capsule  Take 300-600 mg by mouth See admin instructions. Take 1 capsule by mouth in the morning and take 2 capsules (600 mg) by mouth every night at bedtime.     lithium carbonate 300 MG capsule  Take 300 mg by mouth every morning.     lithium 300 MG tablet  Take 300 mg by mouth Nightly.     metoprolol succinate 25 MG 24 hr tablet  Commonly known as:  TOPROL-XL  Take 1 tablet (25 mg total) by mouth daily.     neomycin-bacitracin-polymyxin ointment  Commonly known as:  NEOSPORIN  Apply topically 2 (two) times daily. apply to eye     QUEtiapine 400 MG tablet  Commonly known as:  SEROQUEL  Take 1 tablet (400 mg total) by mouth at bedtime.     topiramate 50 MG tablet  Commonly known as:  TOPAMAX  Take 1 tablet (50 mg total) by mouth daily.          Diet and Activity recommendation: See Discharge Instructions above   Consults obtained - neuro   Major procedures and Radiology Reports - PLEASE review detailed and final reports for all details, in brief -      Ct Head Wo Contrast  02/23/2016  CLINICAL DATA:  Altered mental status. Syncopal episode resulting in a fall on asphalt. EXAM: CT HEAD WITHOUT CONTRAST TECHNIQUE: Contiguous axial images were obtained from the base of the skull through the vertex without intravenous contrast. COMPARISON:  None. FINDINGS: Frontal scalp hematoma. Normal appearing cerebral hemispheres and posterior fossa structures. Normal size and position of the ventricles. No skull fracture, intracranial hemorrhage, mass lesion, CT evidence of acute infarction or paranasal sinus air-fluid levels. IMPRESSION: Frontal scalp hematoma without skull fracture, intracranial hemorrhage or other intracranial abnormality. Electronically Signed   By: Claudie Revering M.D.   On: 02/23/2016 13:24   Mr Brain Wo Contrast  02/24/2016  CLINICAL DATA:  Patient fell and hit head yesterday. Initial encounter. EXAM: MRI HEAD WITHOUT CONTRAST TECHNIQUE:  Multiplanar, multiecho pulse sequences of the brain and surrounding structures were obtained without intravenous contrast. COMPARISON:  Head CT 02/23/2016 FINDINGS: There is no evidence of acute infarct, intracranial hemorrhage, mass, midline shift, or extra-axial fluid collection. Ventricles and sulci are normal. No significant cerebral white matter disease is seen. Orbits are unremarkable. A midline frontal scalp hematoma is again noted. There is trace left maxillary sinus fluid. The mastoid air cells are clear. Major intracranial vascular flow voids are preserved. IMPRESSION: Unremarkable appearance of the brain for age. Electronically Signed   By: Logan Bores M.D.   On: 02/24/2016 09:24   US Carotid Bilateral  02/23/2016  CLINICAL DATA:  Status post recent CVA.  Initial encounter. EXAM: BILATERAL CAROTID DUPLEX ULTRASOUND TECHNIQUE: Pearline Cables scale imaging, color Doppler and duplex ultrasound were performed of bilateral carotid and vertebral arteries in  the neck. COMPARISON:  None. FINDINGS: Criteria: Quantification of carotid stenosis is based on velocity parameters that correlate the residual internal carotid diameter with NASCET-based stenosis levels, using the diameter of the distal internal carotid lumen as the denominator for stenosis measurement. The following velocity measurements were obtained: RIGHT ICA:  86 cm/sec / 30 cm/sec CCA:  98 cm/sec / 26 cm/sec SYSTOLIC ICA/CCA RATIO:  0.9 DIASTOLIC ICA/CCA RATIO:  1.1 ECA:  80 cm/sec LEFT ICA:  86 cm/sec / 30 cm/sec CCA:  111 cm/sec / 35 cm/sec SYSTOLIC ICA/CCA RATIO:  0.8 DIASTOLIC ICA/CCA RATIO:  0.9 ECA:  107 cm/sec RIGHT CAROTID ARTERY: Trace plaque is noted at the right carotid bulb, without significant luminal narrowing. RIGHT VERTEBRAL ARTERY:  Normal antegrade flow noted. LEFT CAROTID ARTERY: Trace plaque is noted at the left carotid bulb, without significant luminal narrowing. LEFT VERTEBRAL ARTERY:  Normal antegrade flow noted. IMPRESSION: No  significant calcific atherosclerotic disease seen. No evidence of luminal narrowing. Normal velocity measurements noted bilaterally, with normal antegrade flow at both vertebral arteries. Electronically Signed   By: Garald Balding M.D.   On: 02/23/2016 18:51    Micro Results     No results found for this or any previous visit (from the past 240 hour(s)).     Today   Subjective:   Elizabeth Malone today has no headache,no chest abdominal pain,no new weakness tingling or numbness, feels much better wants to go home today.   Objective:   Blood pressure 127/53, pulse 64, temperature 98.5 F (36.9 C), temperature source Oral, resp. rate 19, height 5\' 5"  (1.651 m), weight 90.175 kg (198 lb 12.8 oz), SpO2 98 %.  No intake or output data in the 24 hours ending 02/26/16 0957  Exam Awake Alert, Oriented x 3, No new F.N deficits, Normal affect [place].AT,PERRAL Supple Neck,No JVD, No cervical lymphadenopathy appriciated.  Symmetrical Chest wall movement, Good air movement bilaterally, CTAB RRR,No Gallops,Rubs or new Murmurs, No Parasternal Heave +ve B.Sounds, Abd Soft, Non tender, No organomegaly appriciated, No rebound -guarding or rigidity. No Cyanosis, Clubbing or edema, No new Rash or bruise  Data Review   CBC w Diff:  Lab Results  Component Value Date   WBC 8.5 02/24/2016   WBC 5.7 02/04/2016   HGB 12.6 02/24/2016   HCT 36.7 02/24/2016   HCT 41.4 02/04/2016   PLT 197 02/24/2016   PLT 257 02/04/2016    CMP:  Lab Results  Component Value Date   NA 138 02/24/2016   NA 141 02/04/2016   K 3.5 02/24/2016   CL 108 02/24/2016   CO2 24 02/24/2016   BUN 17 02/24/2016   BUN 15 02/04/2016   CREATININE 0.81 02/24/2016   CREATININE 1.0 02/13/2015   GLU 108 02/13/2015   PROT 6.9 02/04/2016   PROT 7.6 07/09/2015   ALBUMIN 4.4 02/04/2016   ALBUMIN 4.5 07/09/2015   BILITOT 0.3 02/04/2016   BILITOT 0.8 07/09/2015   ALKPHOS 86 02/04/2016   AST 16 02/04/2016   ALT 17 02/04/2016   .   Total Time in preparing paper work, data evaluation and todays exam - 75 minutes  Ara Grandmaison M.D on 02/26/2016 at 9:57 AM    Note: This dictation was prepared with Dragon dictation along with smaller phrase technology. Any transcriptional errors that result from this process are unintentional.

## 2016-03-04 ENCOUNTER — Encounter: Payer: Self-pay | Admitting: Family Medicine

## 2016-03-04 ENCOUNTER — Ambulatory Visit (INDEPENDENT_AMBULATORY_CARE_PROVIDER_SITE_OTHER): Payer: BC Managed Care – PPO | Admitting: Family Medicine

## 2016-03-04 VITALS — BP 125/85 | HR 66 | Temp 98.3°F | Ht 65.6 in | Wt 197.0 lb

## 2016-03-04 DIAGNOSIS — R7989 Other specified abnormal findings of blood chemistry: Secondary | ICD-10-CM | POA: Diagnosis not present

## 2016-03-04 DIAGNOSIS — S060X1A Concussion with loss of consciousness of 30 minutes or less, initial encounter: Secondary | ICD-10-CM

## 2016-03-04 DIAGNOSIS — R55 Syncope and collapse: Secondary | ICD-10-CM | POA: Diagnosis not present

## 2016-03-04 NOTE — Progress Notes (Signed)
BP 125/85 mmHg  Pulse 66  Temp(Src) 98.3 F (36.8 C)  Ht 5' 5.6" (1.666 m)  Wt 197 lb (89.359 kg)  BMI 32.19 kg/m2  SpO2 99%   Subjective:    Patient ID: Elizabeth Malone, female    DOB: [birth year], 65 y.o.   MRN: [id number]  HPI: Elizabeth Malone is a 65 y.o. female  Chief Complaint  Patient presents with  . Hospitalization Follow-up   HOSPITAL FOLLOW UP- wasn't feeling well for a couple of days, passed out unwittnessed [place] parking lot at [place], then she went to the ER for evaluation and was admitted Time since discharge: 8 days Hospital/facility: [facility] Diagnosis: Syncope (vasovagal), ?TIA Procedures/tests: CT head negative, MRI brain- negative, Echo- normal, EF>65%, Carotid Doppler- negative, Labs- normal except borderline cholesterol and elevated TSH, EEG normal Consultants: Neurology- follow up with them outpatient New medications: none Discharge instructions: follow up with neurology and here, recheck thyroid in 1 month   Status: better- tired and cold a lot.   Relevant past medical, surgical, family and social history reviewed and updated as indicated. Interim medical history since our last visit reviewed. Allergies and medications reviewed and updated.  Review of Systems  Constitutional: Positive for chills and fatigue. Negative for fever, diaphoresis, activity change, appetite change and unexpected weight change.  Respiratory: Negative.   Cardiovascular: Negative.   Psychiatric/Behavioral: Negative.     Per HPI unless specifically indicated above     Objective:    BP 125/85 mmHg  Pulse 66  Temp(Src) 98.3 F (36.8 C)  Ht 5' 5.6" (1.666 m)  Wt 197 lb (89.359 kg)  BMI 32.19 kg/m2  SpO2 99%  Wt Readings from Last 3 Encounters:  03/04/16 197 lb (89.359 kg)  02/23/16 198 lb 12.8 oz (90.175 kg)  02/20/16 198 lb 9.6 oz (90.084 kg)    Physical Exam  Constitutional: She is oriented to person, place, and time. She appears well-developed and well-nourished. No  distress.  HENT:  Head: Normocephalic.  Right Ear: Hearing normal.  Left Ear: Hearing normal.  Nose: Nose normal.  Bruising under bilateral eyes and down L side of her face   Eyes: Conjunctivae and lids are normal. Right eye exhibits no discharge. Left eye exhibits no discharge. No scleral icterus.  Cardiovascular: Normal rate, regular rhythm, normal heart sounds and intact distal pulses.  Exam reveals no gallop and no friction rub.   No murmur heard. Pulmonary/Chest: Effort normal and breath sounds normal. No respiratory distress. She has no wheezes. She has no rales. She exhibits no tenderness.  Musculoskeletal: Normal range of motion.  Neurological: She is alert and oriented to person, place, and time.  Skin: Skin is warm, dry and intact. No rash noted. No erythema. No pallor.  Psychiatric: She has a normal mood and affect. Her speech is normal and behavior is normal. Judgment and thought content normal. Cognition and memory are normal.  Nursing note and vitals reviewed.   Results for orders placed or performed during the hospital encounter of 02/23/16  Glucose, capillary  Result Value Ref Range   Glucose-Capillary 107 (H) 65 - 99 mg/dL  Basic metabolic panel  Result Value Ref Range   Sodium 137 135 - 145 mmol/L   Potassium 4.0 3.5 - 5.1 mmol/L   Chloride 105 101 - 111 mmol/L   CO2 24 22 - 32 mmol/L   Glucose, Bld 110 (H) 65 - 99 mg/dL   BUN 19 6 - 20 mg/dL   Creatinine, Ser 0.90  0.44 - 1.00 mg/dL   Calcium 10.0 8.9 - 10.3 mg/dL   GFR calc non Af Amer >60 >60 mL/min   GFR calc Af Amer >60 >60 mL/min   Anion gap 8 5 - 15  CBC  Result Value Ref Range   WBC 8.9 3.6 - 11.0 K/uL   RBC 4.66 3.80 - 5.20 MIL/uL   Hemoglobin 14.6 12.0 - 16.0 g/dL   HCT 43.1 35.0 - 47.0 %   MCV 92.4 80.0 - 100.0 fL   MCH 31.3 26.0 - 34.0 pg   MCHC 33.9 32.0 - 36.0 g/dL   RDW 13.2 11.5 - 14.5 %   Platelets 215 150 - 440 K/uL  Urinalysis complete, with microscopic  Result Value Ref Range    Color, Urine STRAW (A) YELLOW   APPearance CLEAR (A) CLEAR   Glucose, UA NEGATIVE NEGATIVE mg/dL   Bilirubin Urine NEGATIVE NEGATIVE   Ketones, ur NEGATIVE NEGATIVE mg/dL   Specific Gravity, Urine 1.006 1.005 - 1.030   Hgb urine dipstick NEGATIVE NEGATIVE   pH 6.0 5.0 - 8.0   Protein, ur NEGATIVE NEGATIVE mg/dL   Nitrite NEGATIVE NEGATIVE   Leukocytes, UA 2+ (A) NEGATIVE   RBC / HPF 0-5 0 - 5 RBC/hpf   WBC, UA 0-5 0 - 5 WBC/hpf   Bacteria, UA NONE SEEN NONE SEEN   Squamous Epithelial / LPF 0-5 (A) NONE SEEN   Mucous PRESENT   Glucose, capillary  Result Value Ref Range   Glucose-Capillary 101 (H) 65 - 99 mg/dL  Lithium level  Result Value Ref Range   Lithium Lvl 0.93 0.60 - 1.20 mmol/L  Hemoglobin A1c  Result Value Ref Range   Hgb A1c MFr Bld 5.0 4.0 - 6.0 %  Lipid panel  Result Value Ref Range   Cholesterol 209 (H) 0 - 200 mg/dL   Triglycerides 301 (H) <150 mg/dL   HDL 57 >40 mg/dL   Total CHOL/HDL Ratio 3.7 RATIO   VLDL 60 (H) 0 - 40 mg/dL   LDL Cholesterol 92 0 - 99 mg/dL  Basic metabolic panel  Result Value Ref Range   Sodium 138 135 - 145 mmol/L   Potassium 3.5 3.5 - 5.1 mmol/L   Chloride 108 101 - 111 mmol/L   CO2 24 22 - 32 mmol/L   Glucose, Bld 97 65 - 99 mg/dL   BUN 17 6 - 20 mg/dL   Creatinine, Ser 0.81 0.44 - 1.00 mg/dL   Calcium 9.6 8.9 - 10.3 mg/dL   GFR calc non Af Amer >60 >60 mL/min   GFR calc Af Amer >60 >60 mL/min   Anion gap 6 5 - 15  CBC  Result Value Ref Range   WBC 8.5 3.6 - 11.0 K/uL   RBC 3.93 3.80 - 5.20 MIL/uL   Hemoglobin 12.6 12.0 - 16.0 g/dL   HCT 36.7 35.0 - 47.0 %   MCV 93.4 80.0 - 100.0 fL   MCH 32.1 26.0 - 34.0 pg   MCHC 34.4 32.0 - 36.0 g/dL   RDW 13.1 11.5 - 14.5 %   Platelets 197 150 - 440 K/uL  TSH  Result Value Ref Range   TSH 6.184 (H) 0.350 - 4.500 uIU/mL  ECHOCARDIOGRAM COMPLETE  Result Value Ref Range   LV PW d 9.6 (A) 0.6 - 1.1 mm   FS 36 28 - 44 %   LA vol 79.4 mL   LA ID, A-P, ES 39 mm   IVS/LV PW RATIO, ED  1.21  LV e' LATERAL 8.92 cm/s   LV E/e' medial 8.5    LV E/e'average 8.5    LA diam index 1.89 cm/m2   LA vol A4C 66.8 ml   E decel time 232 msec   Peak grad 2 mmHg   E/e' ratio 8.5    MV pk E vel 75.8 m/s   MV pk A vel 64.3 m/s   LA vol index 38.4 mL/m2   MV Dec 232    LA diam end sys 39 mm   TDI e' medial 6.42    TDI e' lateral 8.92    Lateral S' vel 15.6 cm/sec   TAPSE 23.5 mm   Weight 3180.8 oz   Height 65 in   BP 141/54 mmHg      Assessment & Plan:   Problem List Items Addressed This Visit      Cardiovascular and Mediastinum   Faintness - Primary    Other Visit Diagnoses    Abnormal TSH        Will recheck in 1 month and get back on synthroid if needed.     Relevant Orders    Thyroid Panel With TSH    Concussion, with loss of consciousness of 30 minutes or less, initial encounter        Discussed brain rest. Headaches better. Rest. Call if not getting better or getting worse.         Follow up plan: Return in about 4 weeks (around 04/01/2016), or lab appoinment, Regular follow up with MAC.

## 2016-03-04 NOTE — Patient Instructions (Signed)
Syncope, commonly known as fainting, is a temporary loss of consciousness. It occurs when the blood flow to the brain is reduced. Vasovagal syncope (also called neurocardiogenic syncope) is a fainting spell in which the blood flow to the brain is reduced because of a sudden drop in heart rate and blood pressure. Vasovagal syncope occurs when the brain and the cardiovascular system (blood vessels) do not adequately communicate and respond to each other. This is the most common cause of fainting. It often occurs in response to fear or some other type of emotional or physical stress. The body has a reaction in which the heart starts beating too slowly or the blood vessels expand, reducing blood pressure. This type of fainting spell is generally considered harmless. However, injuries can occur if a person takes a sudden fall during a fainting spell.   CAUSES   Vasovagal syncope occurs when a person's blood pressure and heart rate decrease suddenly, usually in response to a trigger. Many things and situations can trigger an episode. Some of these include:    Pain.    Fear.    The sight of blood or medical procedures, such as blood being drawn from a vein.    Common activities, such as coughing, swallowing, stretching, or going to the bathroom.    Emotional stress.    Prolonged standing, especially in a warm environment.    Lack of sleep or rest.    Prolonged lack of food.    Prolonged lack of fluids.    Recent illness.   The use of certain drugs that affect blood pressure, such as cocaine, alcohol, marijuana, inhalants, and opiates.   SYMPTOMS   Before the fainting episode, you may:    Feel dizzy or light headed.    Become pale.   Sense that you are going to faint.    Feel like the room is spinning.    Have tunnel vision, only seeing directly in front of you.    Feel sick to your stomach (nauseous).    See spots or slowly lose vision.    Hear ringing in your ears.    Have a headache.     Feel warm and sweaty.    Feel a sensation of pins and needles.  During the fainting spell, you will generally be unconscious for no longer than a couple minutes before waking up and returning to normal. If you get up too quickly before your body can recover, you may faint again. Some twitching or jerky movements may occur during the fainting spell.   DIAGNOSIS   Your health care provider will ask about your symptoms, take a medical history, and perform a physical exam. Various tests may be done to rule out other causes of fainting. These may include blood tests and tests to check the heart, such as electrocardiography, echocardiography, and possibly an electrophysiology study. When other causes have been ruled out, a test may be done to check the body's response to changes in position (tilt table test).  TREATMENT   Most cases of vasovagal syncope do not require treatment. Your health care provider may recommend ways to avoid fainting triggers and may provide home strategies for preventing fainting. If you must be exposed to a possible trigger, you can drink additional fluids to help reduce your chances of having an episode of vasovagal syncope. If you have warning signs of an oncoming episode, you can respond by positioning yourself favorably (lying down).  If your fainting spells continue, you may be   given medicines to prevent fainting. Some medicines may help make you more resistant to repeated episodes of vasovagal syncope. Special exercises or compression stockings may be recommended. In rare cases, the surgical placement of a pacemaker is considered.  HOME CARE INSTRUCTIONS    Learn to identify the warning signs of vasovagal syncope.    Sit or lie down at the first warning sign of a fainting spell. If sitting, put your head down between your legs. If you lie down, swing your legs up in the air to increase blood flow to the brain.    Avoid hot tubs and saunas.   Avoid prolonged standing.   Drink  enough fluids to keep your urine clear or pale yellow. Avoid caffeine.   Increase salt in your diet as directed by your health care provider.    If you have to stand for a long time, perform movements such as:     Crossing your legs.     Flexing and stretching your leg muscles.     Squatting.     Moving your legs.     Bending over.    Only take over-the-counter or prescription medicines as directed by your health care provider. Do not suddenly stop any medicines without asking your health care provider first.  SEEK MEDICAL CARE IF:    Your fainting spells continue or happen more frequently in spite of treatment.    You lose consciousness for more than a couple minutes.   You have fainting spells during or after exercising or after being startled.    You have new symptoms that occur with the fainting spells, such as:     Shortness of breath.    Chest pain.     Irregular heartbeat.    You have episodes of twitching or jerky movements that last longer than a few seconds.   You have episodes of twitching or jerky movements without obvious fainting.  SEEK IMMEDIATE MEDICAL CARE IF:    You have injuries or bleeding after a fainting spell.    You have episodes of twitching or jerky movements that last longer than 5 minutes.    You have more than one spell of twitching or jerky movements before returning to consciousness after fainting.     This information is not intended to replace advice given to you by your health care provider. Make sure you discuss any questions you have with your health care provider.     Document Released: 08/17/2012 Document Revised: 01/15/2015 Document Reviewed: 08/17/2012  Elsevier Interactive Patient Education 2016 [employer]

## 2016-04-02 ENCOUNTER — Other Ambulatory Visit: Payer: BC Managed Care – PPO

## 2016-04-02 DIAGNOSIS — R7989 Other specified abnormal findings of blood chemistry: Secondary | ICD-10-CM

## 2016-04-03 ENCOUNTER — Encounter: Payer: Self-pay | Admitting: Family Medicine

## 2016-04-03 LAB — THYROID PANEL WITH TSH
Free Thyroxine Index: 1.7 (ref 1.2–4.9)
T3 Uptake Ratio: 28 % (ref 24–39)
T4, Total: 6.1 ug/dL (ref 4.5–12.0)
TSH: 3.21 u[IU]/mL (ref 0.450–4.500)

## 2016-04-10 ENCOUNTER — Other Ambulatory Visit: Payer: Self-pay | Admitting: Psychiatry

## 2016-04-13 ENCOUNTER — Inpatient Hospital Stay: Payer: Self-pay | Admitting: Family Medicine

## 2016-04-21 ENCOUNTER — Other Ambulatory Visit: Payer: Self-pay | Admitting: Psychiatry

## 2016-04-21 MED ORDER — TOPIRAMATE 50 MG PO TABS: 50.0000 mg | ORAL_TABLET | Freq: Every day | ORAL | 1 refills | Status: DC

## 2016-05-01 ENCOUNTER — Encounter: Payer: Self-pay | Admitting: Psychiatry

## 2016-05-01 ENCOUNTER — Ambulatory Visit (INDEPENDENT_AMBULATORY_CARE_PROVIDER_SITE_OTHER): Payer: BC Managed Care – PPO | Admitting: Psychiatry

## 2016-05-01 VITALS — BP 136/80 | HR 71 | Temp 97.8°F | Ht 65.6 in | Wt 200.6 lb

## 2016-05-01 DIAGNOSIS — F3130 Bipolar disorder, current episode depressed, mild or moderate severity, unspecified: Secondary | ICD-10-CM

## 2016-05-01 DIAGNOSIS — F070 Personality change due to known physiological condition: Secondary | ICD-10-CM | POA: Diagnosis not present

## 2016-05-01 MED ORDER — LITHIUM CARBONATE 300 MG PO CAPS: 300.0000 mg | ORAL_CAPSULE | ORAL | 1 refills | Status: DC

## 2016-05-01 NOTE — Progress Notes (Signed)
[facility] MD Progress Note  05/01/2016 10:00 AM Elizabeth Malone  MRN:  [id number]   Subjective:    Patient is a 65 year-old married female with history of bipolar disorder who presented for follow-up appointment. She stated that she fell after her last appointment in the parking lot for discharge. She reported that she lost consciousness but regained quickly. She was able to drive back home and was admitted to the hospital due to syncope and to rule out CVA. Patient was discharged from the hospital in couple of days. She reported that since she came back home she has noticed a change in her personality. She is becoming more mellow and depressed. She got rid of a lot of her stuff and gave them to the [facility] She also reported that she used to drink white wine all her life but now suddenly she has started drinking sweet red wine.  She is making all these impulsive decisions. Patient is concerned about her change in behavior. She reported that she has to make decisions about her insurance as well. 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 She reported that she fell on her head and also broke her nose. She is sleeping well at this time. She is taking her medications as prescribed and does not want to change her medications at this time.  She stated that she is apprehensive. She is not having any aggression or paranoia at this time. She appeared depressed during the interview. We went over her medications during the interview. She is compliant with them.     Principal Problem:  Diagnosis:   Patient Active Problem List   Diagnosis Date Noted  . Faintness [R55]   . Syncope [R55] 02/23/2016  . Bipolar disorder ([place]) [F31.9] 08/05/2015  . Essential hypertension [I10] 08/05/2015  . Hypothyroidism [E03.9] 08/05/2015  . Hypercholesteremia [E78.00] 08/05/2015  . CKD (chronic kidney disease) stage 3, GFR 30-59 ml/min [N18.3]     Past Medical History:  Past Medical History:   Diagnosis Date  . Anxiety   . Bipolar disorder ([place])   . CKD (chronic kidney disease) stage 3, GFR 30-59 ml/min   . Complication of anesthesia   . Depression   . History of diverticulosis   . Overactive bladder   . PONV (postoperative nausea and vomiting)   . Substance abuse    alcohol    Past Surgical History:  Procedure Laterality Date  . FOOT SURGERY Bilateral   . KNEE SURGERY Left   . TONSILLECTOMY AND ADENOIDECTOMY     Family History:  Family History  Problem Relation Age of Onset  . Alzheimer's disease Mother   . Bipolar disorder Mother   . Depression Mother   . Heart attack Father   . Diabetes Brother   . Obesity Brother   . Depression Maternal Grandfather   . Depression Daughter   . Breast cancer Neg Hx    Family Psychiatric  History: Family history positive for multiple people with depression and a few people who have committed suicide. Social History:  History  Alcohol Use  . 1.8 oz/week  . 3 Shots of liquor per week     History  Drug Use No    Social History   Social History  . Marital status: Married    Spouse name: N/A  . Number of children: N/A  . Years of education: N/A   Social History Main Topics  . Smoking status: Former Smoker    Types:  Cigarettes    Start date: 07/09/1967    Quit date: 07/08/1985  . Smokeless tobacco: Never Used  . Alcohol use 1.8 oz/week    3 Shots of liquor per week  . Drug use: No  . Sexual activity: Not Currently   Other Topics Concern  . None   Social History Narrative  . None   Additional Social History:                         Sleep: Poor  Appetite:  Good  Current Medications: Current Outpatient Prescriptions  Medication Sig Dispense Refill  . aspirin EC 81 MG tablet Take 81 mg by mouth at bedtime.     Marland Kitchen atorvastatin (LIPITOR) 20 MG tablet Take 1 tablet (20 mg total) by mouth daily at 6 PM. (Patient taking differently: Take 20 mg by mouth every morning. ) 90 tablet 4  . benazepril  (LOTENSIN) 5 MG tablet Take 1 tablet (5 mg total) by mouth daily. (Patient taking differently: Take 5 mg by mouth every morning. ) 90 tablet 4  . busPIRone (BUSPAR) 15 MG tablet Take 1 tablet (15 mg total) by mouth 3 (three) times daily. Pt has supply 180 tablet 1  . Calcium Citrate-Vitamin D (CALCIUM + D PO) Take 1 tablet by mouth 2 (two) times daily.     Marland Kitchen gabapentin (NEURONTIN) 300 MG capsule Take 1 capsule (300 mg total) by mouth 2 (two) times daily. 180 capsule 2  . lithium carbonate 300 MG capsule Take 300-600 mg by mouth See admin instructions. Take 1 capsule by mouth in the morning and take 2 capsules (600 mg) by mouth every night at bedtime.    . metoprolol succinate (TOPROL-XL) 25 MG 24 hr tablet Take 1 tablet (25 mg total) by mouth daily. (Patient taking differently: Take 25 mg by mouth at bedtime. ) 90 tablet 4  . Omega-3 Fatty Acids (FISH OIL) 1000 MG CAPS Take 1,000 mg by mouth 2 (two) times daily.     . QUEtiapine (SEROQUEL) 400 MG tablet Take 1 tablet (400 mg total) by mouth at bedtime. 90 tablet 2  . topiramate (TOPAMAX) 50 MG tablet Take 1 tablet (50 mg total) by mouth daily. 90 tablet 1   No current facility-administered medications for this visit.     Lab Results: No results found for this or any previous visit (from the past 48 hour(s)).  Physical Findings: AIMS:  , ,  ,  ,    CIWA:    COWS:     Musculoskeletal: Strength & Muscle Tone: within normal limits Gait & Station: normal Patient leans: N/A  Psychiatric Specialty Exam: ROS  There were no vitals taken for this visit.There is no height or weight on file to calculate BMI.  General Appearance: Casual  Eye Contact::  Good  Speech:  Normal Rate  Volume:  Normal  Mood:  Anxious  Affect:  Congruent  Thought Process:  Coherent  Orientation:  Full (Time, Place, and Person)  Thought Content:  WDL  Suicidal Thoughts:  No  Homicidal Thoughts:  No  Memory:  Immediate;   Good Recent;   Good Remote;   Good   Judgement:  Fair  Insight:  Good  Psychomotor Activity:  Normal  Concentration:  Fair  Recall:  [place] of Knowledge:Fair  Language: Fair  Akathisia:  No  Handed:  Right  AIMS (if indicated):     Assets:  Communication Skills Desire for Improvement Financial  Resources/Insurance Housing Intimacy Leisure Time Physical Health Resilience Social Support Talents/Skills Transportation  ADL's:  Intact  Cognition: WNL  Sleep:      Treatment Plan Summary:  Discussed with patient at length about the medications treatment risks benefits and alternatives  Mood swings She will continue on Seroquel 400 mg on a daily basis. She will also continue on lithium 300 mg in the morning and 600 mg at bedtime Will continue on gabapentin 300 mg by mouth twice a day   Anxiety She will  continue on buspirone 15 mg by mouth 3 times a day  Continue Topamax 50 mg daily.   Pt has supply of the medications.I will refill lithium at this time.  She will follow-up in 1 months.    More than 50% of the time spent in psychoeducation, counseling and coordination of care.    This note was generated in part or whole with voice recognition software. Voice regonition is usually quite accurate but there are transcription errors that can and very often do occur. I apologize for any typographical errors that were not detected and corrected.     Rainey Pines, MD  05/01/2016, 10:00 AM  HPI

## 2016-05-13 ENCOUNTER — Other Ambulatory Visit: Payer: Self-pay | Admitting: Psychiatry

## 2016-05-19 ENCOUNTER — Telehealth: Payer: Self-pay

## 2016-05-19 NOTE — Telephone Encounter (Signed)
called in a 90 day supply for both medications

## 2016-05-19 NOTE — Telephone Encounter (Signed)
received a request for a 90 day supply for gabapentin and quetiapine.

## 2016-06-01 ENCOUNTER — Ambulatory Visit: Payer: BC Managed Care – PPO | Admitting: Psychiatry

## 2016-06-04 ENCOUNTER — Telehealth: Payer: Self-pay | Admitting: Family Medicine

## 2016-06-04 NOTE — Telephone Encounter (Signed)
Pt would like an rx to get her pneumonia shot from [facility]

## 2016-06-04 NOTE — Telephone Encounter (Signed)
Patient has no indications for Pneumonia Vaccine at  This time, she will qualify in December when reaches age 65

## 2016-07-01 ENCOUNTER — Ambulatory Visit (INDEPENDENT_AMBULATORY_CARE_PROVIDER_SITE_OTHER): Payer: BC Managed Care – PPO | Admitting: Psychiatry

## 2016-07-01 ENCOUNTER — Encounter: Payer: Self-pay | Admitting: Psychiatry

## 2016-07-01 VITALS — BP 124/80 | HR 72 | Temp 98.0°F | Wt 205.4 lb

## 2016-07-01 DIAGNOSIS — F070 Personality change due to known physiological condition: Secondary | ICD-10-CM | POA: Diagnosis not present

## 2016-07-01 DIAGNOSIS — F3130 Bipolar disorder, current episode depressed, mild or moderate severity, unspecified: Secondary | ICD-10-CM | POA: Diagnosis not present

## 2016-07-01 MED ORDER — TOPIRAMATE 50 MG PO TABS: 50.0000 mg | ORAL_TABLET | Freq: Every day | ORAL | 1 refills | Status: DC

## 2016-07-01 MED ORDER — QUETIAPINE FUMARATE 400 MG PO TABS: 400.0000 mg | ORAL_TABLET | Freq: Every day | ORAL | 2 refills | Status: DC

## 2016-07-01 MED ORDER — GABAPENTIN 300 MG PO CAPS: 300.0000 mg | ORAL_CAPSULE | Freq: Two times a day (BID) | ORAL | 2 refills | Status: DC

## 2016-07-01 MED ORDER — LITHIUM CARBONATE 300 MG PO CAPS: 300.0000 mg | ORAL_CAPSULE | ORAL | 1 refills | Status: DC

## 2016-07-01 MED ORDER — BUSPIRONE HCL 15 MG PO TABS: 15.0000 mg | ORAL_TABLET | Freq: Three times a day (TID) | ORAL | 1 refills | Status: DC

## 2016-07-01 NOTE — Progress Notes (Signed)
[facility] MD Progress Note  07/01/2016 10:02 AM Elizabeth Malone  MRN:  [id number]   Subjective:    Patient is a 65 year-old married female with history of bipolar disorder who presented for follow-up appointment. She stated that she has been doing well and has been taking her medications as prescribed. 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 Her husband is also very supportive. Patient reported that she has noticed a change in her personality since she fell and she is becoming more mellow. She has been compliant with her medications. Patient reported that she is going to have her physical exam done next month. She is not having any side effects of her medications. We discussed about her medications at length. She reported that all her medications are helping her. She is also going to change   her insurance as she will be 65 years old on December 1.   Patient currently denied having any perceptual disturbances. She denied having any suicidal homicidal ideations or plans.   She is compliant with her medications. She appeared calm and alert during the interview.    Principal Problem:  Diagnosis:   Patient Active Problem List   Diagnosis Date Noted  . Faintness [R55]   . Syncope [R55] 02/23/2016  . Bipolar disorder ([place]) [F31.9] 08/05/2015  . Essential hypertension [I10] 08/05/2015  . Hypothyroidism [E03.9] 08/05/2015  . Hypercholesteremia [E78.00] 08/05/2015  . CKD (chronic kidney disease) stage 3, GFR 30-59 ml/min [N18.3]     Past Medical History:  Past Medical History:  Diagnosis Date  . Anxiety   . Bipolar disorder ([place])   . CKD (chronic kidney disease) stage 3, GFR 30-59 ml/min   . Complication of anesthesia   . Depression   . History of diverticulosis   . Overactive bladder   . PONV (postoperative nausea and vomiting)   .  Substance abuse    alcohol    Past Surgical History:  Procedure Laterality Date  . FOOT SURGERY Bilateral   . KNEE SURGERY Left   . TONSILLECTOMY AND ADENOIDECTOMY     Family History:  Family History  Problem Relation Age of Onset  . Alzheimer's disease Mother   . Bipolar disorder Mother   . Depression Mother   . Heart attack Father   . Diabetes Brother   . Obesity Brother   . Depression Maternal Grandfather   . Depression Daughter   . Breast cancer Neg Hx    Family Psychiatric  History: Family history positive for multiple people with depression and a few people who have committed suicide. Social History:  History  Alcohol Use  . 1.8 oz/week  . 3 Shots of liquor per week     History  Drug Use No    Social History   Social History  . Marital status: Married    Spouse name: N/A  . Number of children: N/A  . Years of education: N/A   Social History Main Topics  . Smoking status: Former Smoker    Types: Cigarettes    Start date: 07/09/1967    Quit date: 07/08/1985  . Smokeless tobacco: Never Used  . Alcohol use 1.8 oz/week    3 Shots of liquor per week  . Drug use: No  . Sexual activity: Not Currently   Other Topics Concern  .  None   Social History Narrative  . None   Additional Social History:                         Sleep: Poor  Appetite:  Good  Current Medications: Current Outpatient Prescriptions  Medication Sig Dispense Refill  . aspirin EC 81 MG tablet Take 81 mg by mouth at bedtime.     Marland Kitchen atorvastatin (LIPITOR) 20 MG tablet Take 1 tablet (20 mg total) by mouth daily at 6 PM. (Patient taking differently: Take 20 mg by mouth every morning. ) 90 tablet 4  . benazepril (LOTENSIN) 5 MG tablet Take 1 tablet (5 mg total) by mouth daily. (Patient taking differently: Take 5 mg by mouth every morning. ) 90 tablet 4  . busPIRone (BUSPAR) 15 MG tablet Take 1 tablet (15 mg total) by mouth 3 (three) times daily. Pt has supply 180 tablet 1  .  Calcium Citrate-Vitamin D (CALCIUM + D PO) Take 1 tablet by mouth 2 (two) times daily.     Marland Kitchen gabapentin (NEURONTIN) 300 MG capsule Take 1 capsule (300 mg total) by mouth 2 (two) times daily. 180 capsule 2  . lithium carbonate 300 MG capsule Take 1 capsule (300 mg total) by mouth See admin instructions. Take 1 pill in am and 2 pills at bedtime 270 capsule 1  . metoprolol succinate (TOPROL-XL) 25 MG 24 hr tablet Take 1 tablet (25 mg total) by mouth daily. (Patient taking differently: Take 25 mg by mouth at bedtime. ) 90 tablet 4  . Omega-3 Fatty Acids (FISH OIL) 1000 MG CAPS Take 1,000 mg by mouth 2 (two) times daily.     . QUEtiapine (SEROQUEL) 400 MG tablet Take 1 tablet (400 mg total) by mouth at bedtime. 90 tablet 2  . topiramate (TOPAMAX) 50 MG tablet Take 1 tablet (50 mg total) by mouth daily. 90 tablet 1   No current facility-administered medications for this visit.     Lab Results: No results found for this or any previous visit (from the past 48 hour(s)).  Physical Findings: AIMS:  , ,  ,  ,    CIWA:    COWS:     Musculoskeletal: Strength & Muscle Tone: within normal limits Gait & Station: normal Patient leans: N/A  Psychiatric Specialty Exam: ROS  There were no vitals taken for this visit.There is no height or weight on file to calculate BMI.  General Appearance: Casual  Eye Contact::  Good  Speech:  Normal Rate  Volume:  Normal  Mood:  Anxious  Affect:  Congruent  Thought Process:  Coherent  Orientation:  Full (Time, Place, and Person)  Thought Content:  WDL  Suicidal Thoughts:  No  Homicidal Thoughts:  No  Memory:  Immediate;   Good Recent;   Good Remote;   Good  Judgement:  Fair  Insight:  Good  Psychomotor Activity:  Normal  Concentration:  Fair  Recall:  [employer] of Knowledge:Fair  Language: Fair  Akathisia:  No  Handed:  Right  AIMS (if indicated):     Assets:  Communication Skills Desire for Improvement Financial  Resources/Insurance Housing Intimacy Leisure Time [place] Talents/Skills Transportation  ADL's:  Intact  Cognition: WNL  Sleep:      Treatment Plan Summary:  Discussed with patient at length about the medications treatment risks benefits and alternatives  Mood swings She will continue on Seroquel 400 mg on a daily basis. She  will also continue on lithium 300 mg in the morning and 600 mg at bedtime Will continue on gabapentin 300 mg by mouth twice a day   Anxiety She will  continue on buspirone 15 mg by mouth 3 times a day  Continue Topamax 50 mg daily.    She will follow-up in 3  months.    More than 50% of the time spent in psychoeducation, counseling and coordination of care.    This note was generated in part or whole with voice recognition software. Voice regonition is usually quite accurate but there are transcription errors that can and very often do occur. I apologize for any typographical errors that were not detected and corrected.     Rainey Pines, MD  07/01/2016, 10:02 AM  Medication Refill

## 2016-07-31 ENCOUNTER — Other Ambulatory Visit: Payer: Self-pay | Admitting: Psychiatry

## 2016-08-05 ENCOUNTER — Ambulatory Visit (INDEPENDENT_AMBULATORY_CARE_PROVIDER_SITE_OTHER): Payer: BC Managed Care – PPO | Admitting: Family Medicine

## 2016-08-05 ENCOUNTER — Encounter: Payer: Self-pay | Admitting: Family Medicine

## 2016-08-05 VITALS — BP 137/78 | HR 83 | Temp 98.1°F | Ht 65.2 in | Wt 206.9 lb

## 2016-08-05 DIAGNOSIS — R946 Abnormal results of thyroid function studies: Secondary | ICD-10-CM

## 2016-08-05 DIAGNOSIS — F3132 Bipolar disorder, current episode depressed, moderate: Secondary | ICD-10-CM | POA: Diagnosis not present

## 2016-08-05 DIAGNOSIS — E7800 Pure hypercholesterolemia, unspecified: Secondary | ICD-10-CM

## 2016-08-05 DIAGNOSIS — Z1231 Encounter for screening mammogram for malignant neoplasm of breast: Secondary | ICD-10-CM

## 2016-08-05 DIAGNOSIS — N183 Chronic kidney disease, stage 3 unspecified: Secondary | ICD-10-CM

## 2016-08-05 DIAGNOSIS — I10 Essential (primary) hypertension: Secondary | ICD-10-CM

## 2016-08-05 DIAGNOSIS — R7989 Other specified abnormal findings of blood chemistry: Secondary | ICD-10-CM

## 2016-08-05 DIAGNOSIS — Z1239 Encounter for other screening for malignant neoplasm of breast: Secondary | ICD-10-CM

## 2016-08-05 LAB — URINALYSIS, ROUTINE W REFLEX MICROSCOPIC
BILIRUBIN UA: NEGATIVE
GLUCOSE, UA: NEGATIVE
KETONES UA: NEGATIVE
Nitrite, UA: NEGATIVE
PROTEIN UA: NEGATIVE
RBC UA: NEGATIVE
SPEC GRAV UA: 1.01 (ref 1.005–1.030)
Urobilinogen, Ur: 0.2 mg/dL (ref 0.2–1.0)
pH, UA: 7 (ref 5.0–7.5)

## 2016-08-05 LAB — MICROSCOPIC EXAMINATION: RBC, UA: NONE SEEN /hpf (ref 0–?)

## 2016-08-05 NOTE — Assessment & Plan Note (Signed)
The current medical regimen is effective;  continue present plan and medications.  

## 2016-08-05 NOTE — Progress Notes (Addendum)
BP 137/78 (BP Location: Left Arm, Patient Position: Sitting, Cuff Size: Large)   Pulse 83   Temp 98.1 F (36.7 C)   Ht 5' 5.2" (1.656 m)   Wt 206 lb 14.4 oz (93.8 kg)   SpO2 97%   BMI 34.22 kg/m    Subjective:    Patient ID: Elizabeth Malone, female    DOB: 03/17/1951, 65 y.o.   MRN: [id number]  HPI: Elizabeth Malone is a 65 y.o. female  Chief Complaint  Patient presents with  . Annual Exam  Patient all in all doing okay nerves are doing okay but concerned about possibility of side effects with some slight tremor and hair loss. Blood pressure doing well with no complaints taking medicines faithfully. Same for cholesterol medicines. No issues with lithium Will check lithium level today.   Relevant past medical, surgical, family and social history reviewed and updated as indicated. Interim medical history since our last visit reviewed. Allergies and medications reviewed and updated.  Review of Systems  Constitutional: Negative.   HENT: Negative.   Eyes: Negative.   Respiratory: Negative.   Cardiovascular: Negative.   Gastrointestinal: Negative.   Endocrine: Negative.   Genitourinary: Negative.   Musculoskeletal: Negative.   Skin: Negative.   Allergic/Immunologic: Negative.   Neurological: Negative.   Hematological: Negative.   Psychiatric/Behavioral: Negative.     Per HPI unless specifically indicated above     Objective:    BP 137/78 (BP Location: Left Arm, Patient Position: Sitting, Cuff Size: Large)   Pulse 83   Temp 98.1 F (36.7 C)   Ht 5' 5.2" (1.656 m)   Wt 206 lb 14.4 oz (93.8 kg)   SpO2 97%   BMI 34.22 kg/m   Wt Readings from Last 3 Encounters:  08/05/16 206 lb 14.4 oz (93.8 kg)  03/04/16 197 lb (89.4 kg)  02/23/16 198 lb 12.8 oz (90.2 kg)    Physical Exam  Constitutional: She is oriented to person, place, and time. She appears well-developed and well-nourished.  HENT:  Head: Normocephalic and atraumatic.  Right Ear: External ear normal.  Left  Ear: External ear normal.  Nose: Nose normal.  Mouth/Throat: Oropharynx is clear and moist.  Eyes: Conjunctivae and EOM are normal. Pupils are equal, round, and reactive to light.  Neck: Normal range of motion. Neck supple. Carotid bruit is not present.  Cardiovascular: Normal rate, regular rhythm and normal heart sounds.   No murmur heard. Pulmonary/Chest: Effort normal and breath sounds normal. She exhibits no mass. Right breast exhibits no mass, no skin change and no tenderness. Left breast exhibits no mass, no skin change and no tenderness. Breasts are symmetrical.  Abdominal: Soft. Bowel sounds are normal. There is no hepatosplenomegaly.  Musculoskeletal: Normal range of motion.  Neurological: She is alert and oriented to person, place, and time.  Skin: No rash noted.  Psychiatric: She has a normal mood and affect. Her behavior is normal. Judgment and thought content normal.    Results for orders placed or performed in visit on 04/02/16  Thyroid Panel With TSH  Result Value Ref Range   TSH 3.210 0.450 - 4.500 uIU/mL   T4, Total 6.1 4.5 - 12.0 ug/dL   T3 Uptake Ratio 28 24 - 39 %   Free Thyroxine Index 1.7 1.2 - 4.9      Assessment & Plan:   Problem List Items Addressed This Visit      Cardiovascular and Mediastinum   Essential hypertension    The current  medical regimen is effective;  continue present plan and medications.       Relevant Orders   CBC with Differential/Platelet   Comprehensive metabolic panel     Genitourinary   CKD (chronic kidney disease) stage 3, GFR 30-59 ml/min   Relevant Orders   CBC with Differential/Platelet   Comprehensive metabolic panel   Urinalysis, Routine w reflex microscopic (not at [facility])     Other   Bipolar disorder ([place])    Followed by psychiatry and stable maybe side effects to some medications.      Relevant Orders   Lithium level   Hypercholesteremia    The current medical regimen is effective;  continue present plan and  medications.       Relevant Orders   CBC with Differential/Platelet   Comprehensive metabolic panel   Lipid Panel w/o Chol/HDL Ratio    Other Visit Diagnoses    Abnormal TSH    -  Primary   Relevant Orders   TSH   Screening for breast cancer       Relevant Orders   MM DIGITAL SCREENING BILATERAL      For hair loss possibly side effects to medications but will check lab work and observe. Ultimately hair loss is less of a concern than good control of bipolar issues. Follow up plan: Return in about 6 months (around 02/02/2017) for BMP,  Lipids, ALT, AST.  Lab work came back showing declining renal function and lithium level top end of normal. Patient will stop aspirin recheck BMP lithium levels in about 2 months.

## 2016-08-05 NOTE — Assessment & Plan Note (Signed)
Followed by psychiatry and stable maybe side effects to some medications.

## 2016-08-06 LAB — LIPID PANEL W/O CHOL/HDL RATIO
Cholesterol, Total: 174 mg/dL (ref 100–199)
HDL: 58 mg/dL (ref >39)
LDL Calculated: 77 mg/dL (ref 0–99)
TRIGLYCERIDES: 195 mg/dL — AB (ref 0–149)
VLDL Cholesterol Cal: 39 mg/dL (ref 5–40)

## 2016-08-06 LAB — CBC WITH DIFFERENTIAL/PLATELET
BASOS: 0 %
Basophils Absolute: 0 10*3/uL (ref 0.0–0.2)
EOS (ABSOLUTE): 0.2 10*3/uL (ref 0.0–0.4)
EOS: 3 %
HEMATOCRIT: 41 % (ref 34.0–46.6)
Hemoglobin: 14 g/dL (ref 11.1–15.9)
Immature Grans (Abs): 0 10*3/uL (ref 0.0–0.1)
Immature Granulocytes: 0 %
LYMPHS ABS: 1.8 10*3/uL (ref 0.7–3.1)
Lymphs: 29 %
MCH: 31.7 pg (ref 26.6–33.0)
MCHC: 34.1 g/dL (ref 31.5–35.7)
MCV: 93 fL (ref 79–97)
MONOS ABS: 0.5 10*3/uL (ref 0.1–0.9)
Monocytes: 7 %
Neutrophils Absolute: 3.8 10*3/uL (ref 1.4–7.0)
Neutrophils: 61 %
Platelets: 256 10*3/uL (ref 150–379)
RBC: 4.42 x10E6/uL (ref 3.77–5.28)
RDW: 13.3 % (ref 12.3–15.4)
WBC: 6.2 10*3/uL (ref 3.4–10.8)

## 2016-08-06 LAB — COMPREHENSIVE METABOLIC PANEL
A/G RATIO: 1.9 (ref 1.2–2.2)
ALBUMIN: 4.7 g/dL (ref 3.6–4.8)
ALK PHOS: 90 IU/L (ref 39–117)
ALT: 28 IU/L (ref 0–32)
AST: 17 IU/L (ref 0–40)
BUN / CREAT RATIO: 14 (ref 12–28)
BUN: 15 mg/dL (ref 8–27)
Bilirubin Total: 0.7 mg/dL (ref 0.0–1.2)
CALCIUM: 10.5 mg/dL — AB (ref 8.7–10.3)
CO2: 24 mmol/L (ref 18–29)
Chloride: 101 mmol/L (ref 96–106)
Creatinine, Ser: 1.09 mg/dL — ABNORMAL HIGH (ref 0.57–1.00)
GFR calc Af Amer: 62 mL/min/{1.73_m2} (ref >59)
GFR, EST NON AFRICAN AMERICAN: 54 mL/min/{1.73_m2} — AB (ref >59)
GLOBULIN, TOTAL: 2.5 g/dL (ref 1.5–4.5)
Glucose: 112 mg/dL — ABNORMAL HIGH (ref 65–99)
POTASSIUM: 4.8 mmol/L (ref 3.5–5.2)
SODIUM: 139 mmol/L (ref 134–144)
Total Protein: 7.2 g/dL (ref 6.0–8.5)

## 2016-08-06 LAB — LITHIUM LEVEL: Lithium Lvl: 1.2 mmol/L (ref 0.6–1.2)

## 2016-08-06 LAB — TSH: TSH: 2.95 u[IU]/mL (ref 0.450–4.500)

## 2016-08-10 NOTE — Addendum Note (Signed)
Addended byGolden Pop on: 08/10/2016 05:06 PM   Modules accepted: Orders

## 2016-08-18 ENCOUNTER — Telehealth: Payer: Self-pay

## 2016-08-18 NOTE — Telephone Encounter (Signed)
pt called left message that she has the labwork result that you were wanting.

## 2016-09-27 ENCOUNTER — Other Ambulatory Visit: Payer: Self-pay | Admitting: Family Medicine

## 2016-09-28 NOTE — Telephone Encounter (Signed)
Routing to provider. Appt 02/02/17.

## 2016-09-28 NOTE — Telephone Encounter (Signed)
Your patient 

## 2016-09-30 ENCOUNTER — Ambulatory Visit: Payer: 59 | Admitting: Psychiatry

## 2016-09-30 ENCOUNTER — Ambulatory Visit: Payer: BC Managed Care – PPO

## 2016-10-08 ENCOUNTER — Telehealth: Payer: Self-pay | Admitting: Family Medicine

## 2016-10-08 NOTE — Telephone Encounter (Signed)
Patient wanted to know when Dr Jeananne Rama would like for her to come in for her metabolic panel and she also wants a pneumonia shot when she is scheduled.  Please advise.  Thanks

## 2016-10-08 NOTE — Telephone Encounter (Signed)
Per provider's last note patient should follow up in 6 months for BMP, Lipids, ALT, AST.

## 2016-10-09 NOTE — Telephone Encounter (Signed)
My apologies. Pt does have labs order in system. Provider would like her to come in next week if possible.

## 2016-10-09 NOTE — Telephone Encounter (Signed)
Called patient back with the information given per [facility]. She states that Dr Jeananne Rama actually put something in [place] and also called her to tell her she needed to have her labs checked sooner than her follow up appt.  I told her I would send this message to [place].  Please advise.  Thanks

## 2016-10-09 NOTE — Telephone Encounter (Signed)
Tried to call patient back to have her come in next week for her labs. No answer.  Elizabeth Malone

## 2016-10-16 ENCOUNTER — Telehealth: Payer: Self-pay | Admitting: Family Medicine

## 2016-10-16 ENCOUNTER — Ambulatory Visit (INDEPENDENT_AMBULATORY_CARE_PROVIDER_SITE_OTHER): Payer: Medicare Other | Admitting: Psychiatry

## 2016-10-16 ENCOUNTER — Encounter: Payer: Self-pay | Admitting: Psychiatry

## 2016-10-16 VITALS — BP 142/88 | HR 89 | Temp 97.4°F | Wt 211.4 lb

## 2016-10-16 DIAGNOSIS — Z23 Encounter for immunization: Secondary | ICD-10-CM

## 2016-10-16 DIAGNOSIS — F070 Personality change due to known physiological condition: Secondary | ICD-10-CM

## 2016-10-16 DIAGNOSIS — F3130 Bipolar disorder, current episode depressed, mild or moderate severity, unspecified: Secondary | ICD-10-CM | POA: Diagnosis not present

## 2016-10-16 MED ORDER — LITHIUM CARBONATE 300 MG PO TABS: 300.0000 mg | ORAL_TABLET | Freq: Every day | ORAL | 1 refills | Status: DC

## 2016-10-16 MED ORDER — BUSPIRONE HCL 15 MG PO TABS: 15.0000 mg | ORAL_TABLET | Freq: Three times a day (TID) | ORAL | 1 refills | Status: DC

## 2016-10-16 NOTE — Progress Notes (Signed)
[facility] MD Progress Note  10/16/2016 10:38 AM Elizabeth Malone  MRN:  [id number]   Subjective:    Patient is a 66 year-old married female with history of bipolar disorder who presented for follow-up appointment. Patient was last seen in October. She reported that she was having side effects from the lithium and she gradually decrease the dose to 300 mg. She reported that she followed with her primary care physician Dr. Ouida Sills and he agreed with the plan. She is currently doing well. She denied having any side effects of medications. Patient appeared calm and alert during the interview. She reported that she has been compliant with her other medications. Patient stated that she feels better on the current combination of the medication. She does not have any suicidal ideations or plans. We discussed about her medications in detail. She reported that she has a follow-up appointment with Dr. Ouida Sills who will be checking her labs and she does not want to do her labs at this time.   She is planning to start exercise program soon. She reported that the current combination of the medication is helping her. She has missed her appointments in the past few months and was not following up on a regular basis.   We discussed about medication compliance and regular appointments and she agreed with the plan. She reported  that she has enough supply of the medications and only wants the refill on the BuSpar at this time.  She currently denied having any suicidal homicidal ideations or plans. She denied having any perceptual disturbances.    Principal Problem:  Diagnosis:   Patient Active Problem List   Diagnosis Date Noted  . Bipolar disorder ([place]) [F31.9] 08/05/2015  . Essential hypertension [I10] 08/05/2015  . Hypercholesteremia [E78.00] 08/05/2015  . CKD (chronic kidney disease) stage 3, GFR 30-59 ml/min [N18.3]     Past Medical History:  Past Medical History:  Diagnosis Date  . Anxiety   . Bipolar disorder  ([place])   . CKD (chronic kidney disease) stage 3, GFR 30-59 ml/min   . Complication of anesthesia   . Depression   . History of diverticulosis   . Overactive bladder   . PONV (postoperative nausea and vomiting)   . Substance abuse    alcohol    Past Surgical History:  Procedure Laterality Date  . FOOT SURGERY Bilateral   . KNEE SURGERY Left   . TONSILLECTOMY AND ADENOIDECTOMY     Family History:  Family History  Problem Relation Age of Onset  . Alzheimer's disease Mother   . Bipolar disorder Mother   . Depression Mother   . Heart attack Father   . Diabetes Brother   . Obesity Brother   . Depression Maternal Grandfather   . Depression Daughter   . Breast cancer Neg Hx    Family Psychiatric  History: Family history positive for multiple people with depression and a few people who have committed suicide. Social History:  History  Alcohol Use  . 1.8 oz/week  . 3 Shots of liquor per week     History  Drug Use No    Social History   Social History  . Marital status: Married    Spouse name: N/A  . Number of children: N/A  . Years of education: N/A   Social History Main Topics  . Smoking status: Former Smoker    Types: Cigarettes    Start date: 07/09/1967    Quit date: 07/08/1985  . Smokeless tobacco: Never Used  .  Alcohol use 1.8 oz/week    3 Shots of liquor per week  . Drug use: No  . Sexual activity: Not Currently   Other Topics Concern  . None   Social History Narrative  . None   Additional Social History:                         Sleep: Poor  Appetite:  Good  Current Medications: Current Outpatient Prescriptions  Medication Sig Dispense Refill  . aspirin EC 81 MG tablet Take 81 mg by mouth at bedtime.     Marland Kitchen atorvastatin (LIPITOR) 20 MG tablet TAKE 1 TABLET BY MOUTH  DAILY AT 6PM 90 tablet 1  . benazepril (LOTENSIN) 5 MG tablet TAKE 1 TABLET BY MOUTH  DAILY 90 tablet 1  . busPIRone (BUSPAR) 15 MG tablet Take 1 tablet (15 mg total) by  mouth 3 (three) times daily. 180 tablet 1  . Calcium Citrate-Vitamin D (CALCIUM + D PO) Take 1 tablet by mouth 2 (two) times daily.     Marland Kitchen gabapentin (NEURONTIN) 300 MG capsule Take 1 capsule (300 mg total) by mouth 2 (two) times daily. 180 capsule 2  . lithium 300 MG tablet Take 1 tablet (300 mg total) by mouth at bedtime. 90 tablet 1  . metoprolol succinate (TOPROL-XL) 25 MG 24 hr tablet TAKE 1 TABLET DAILY 90 tablet 1  . Omega-3 Fatty Acids (FISH OIL) 1000 MG CAPS Take 1,000 mg by mouth 2 (two) times daily.     . QUEtiapine (SEROQUEL) 400 MG tablet Take 1 tablet (400 mg total) by mouth at bedtime. 90 tablet 2  . topiramate (TOPAMAX) 50 MG tablet Take 1 tablet (50 mg total) by mouth daily. 90 tablet 1   No current facility-administered medications for this visit.     Lab Results: No results found for this or any previous visit (from the past 48 hour(s)).  Physical Findings: AIMS:  , ,  ,  ,    CIWA:    COWS:     Musculoskeletal: Strength & Muscle Tone: within normal limits Gait & Station: normal Patient leans: N/A  Psychiatric Specialty Exam: ROS  Blood pressure (!) 142/88, pulse 89, temperature 97.4 F (36.3 C), temperature source Oral, weight 211 lb 6.4 oz (95.9 kg).Body mass index is 34.96 kg/m.  General Appearance: Casual  Eye Contact::  Good  Speech:  Normal Rate  Volume:  Normal  Mood:  Anxious  Affect:  Congruent  Thought Process:  Coherent  Orientation:  Full (Time, Place, and Person)  Thought Content:  WDL  Suicidal Thoughts:  No  Homicidal Thoughts:  No  Memory:  Immediate;   Good Recent;   Good Remote;   Good  Judgement:  Fair  Insight:  Good  Psychomotor Activity:  Normal  Concentration:  Fair  Recall:  [employer] of Knowledge:Fair  Language: Fair  Akathisia:  No  Handed:  Right  AIMS (if indicated):     Assets:  Communication Skills Desire for Improvement Financial Resources/Insurance Housing Intimacy Leisure Time [place] Talents/Skills Transportation  ADL's:  Intact  Cognition: WNL  Sleep:      Treatment Plan Summary:  Discussed with patient at length about the medications treatment risks benefits and alternatives  Mood swings She will continue on Seroquel 400 mg on a daily basis. She will also continue on lithium 300 mg qhs  Will continue on gabapentin 300 mg by mouth twice a  day   Anxiety She will  continue on buspirone 15 mg by mouth 3 times a day-  refilled.  Continue Topamax 50 mg daily.    She will follow-up in 1   months.    More than 50% of the time spent in psychoeducation, counseling and coordination of care.    This note was generated in part or whole with voice recognition software. Voice regonition is usually quite accurate but there are transcription errors that can and very often do occur. I apologize for any typographical errors that were not detected and corrected.     Rainey Pines, MD  10/16/2016, 10:38 AM  Medication Refill

## 2016-10-16 NOTE — Telephone Encounter (Signed)
Patient would like for Corey Skains to call her on Monday regarding the labs that Dr Jeananne Rama needs for her to come in to take.  She would like to have more information on these labs.  She plans to come in on Tuesday to have these taken care of.  Thank Dennis Bast  Santiago Glad   [phone]

## 2016-10-19 NOTE — Telephone Encounter (Signed)
Pt called to see about her upcoming lab work. Did review chart, found that patient was due for a BMP and lithium. Pt would like to come in tomorrow to have drawn. Pt requested I double check with provider to see if there was any other labs that are needed. Pt stated she is under new insurance and is being very cost conscience with it. Pt also requesting a pneumonia vaccine.

## 2016-10-19 NOTE — Telephone Encounter (Signed)
That's all

## 2016-10-20 ENCOUNTER — Ambulatory Visit (INDEPENDENT_AMBULATORY_CARE_PROVIDER_SITE_OTHER): Payer: Medicare Other

## 2016-10-20 ENCOUNTER — Other Ambulatory Visit: Payer: Self-pay | Admitting: Family Medicine

## 2016-10-20 ENCOUNTER — Ambulatory Visit
Admission: RE | Admit: 2016-10-20 | Discharge: 2016-10-20 | Disposition: A | Discharge status: Home / Self Care | Payer: Medicare Other | Source: Ambulatory Visit | Attending: Family Medicine | Admitting: Family Medicine

## 2016-10-20 ENCOUNTER — Other Ambulatory Visit: Payer: Medicare Other

## 2016-10-20 DIAGNOSIS — N183 Chronic kidney disease, stage 3 unspecified: Secondary | ICD-10-CM

## 2016-10-20 DIAGNOSIS — F3132 Bipolar disorder, current episode depressed, moderate: Secondary | ICD-10-CM

## 2016-10-20 DIAGNOSIS — Z1239 Encounter for other screening for malignant neoplasm of breast: Secondary | ICD-10-CM

## 2016-10-20 DIAGNOSIS — Z1231 Encounter for screening mammogram for malignant neoplasm of breast: Secondary | ICD-10-CM | POA: Insufficient documentation

## 2016-10-20 DIAGNOSIS — Z23 Encounter for immunization: Secondary | ICD-10-CM | POA: Diagnosis not present

## 2016-10-20 MED ORDER — SCOPOLAMINE 1 MG/3DAYS TD PT72: 1.0000 | MEDICATED_PATCH | TRANSDERMAL | 1 refills | Status: DC

## 2016-10-21 ENCOUNTER — Encounter: Payer: Self-pay | Admitting: Family Medicine

## 2016-10-21 LAB — BASIC METABOLIC PANEL
BUN / CREAT RATIO: 18 (ref 12–28)
BUN: 17 mg/dL (ref 8–27)
CALCIUM: 10.4 mg/dL — AB (ref 8.7–10.3)
CHLORIDE: 103 mmol/L (ref 96–106)
CO2: 23 mmol/L (ref 18–29)
Creatinine, Ser: 0.96 mg/dL (ref 0.57–1.00)
GFR calc non Af Amer: 62 mL/min/{1.73_m2} (ref >59)
GFR, EST AFRICAN AMERICAN: 72 mL/min/{1.73_m2} (ref >59)
GLUCOSE: 111 mg/dL — AB (ref 65–99)
POTASSIUM: 4.5 mmol/L (ref 3.5–5.2)
Sodium: 140 mmol/L (ref 134–144)

## 2016-10-21 LAB — LITHIUM LEVEL: LITHIUM LVL: 0.3 mmol/L — AB (ref 0.6–1.2)

## 2016-10-26 ENCOUNTER — Telehealth: Payer: Self-pay | Admitting: Family Medicine

## 2016-10-26 NOTE — Telephone Encounter (Signed)
yes

## 2016-10-26 NOTE — Telephone Encounter (Signed)
Okay for pt to use OTC pain relievers? Please advise.

## 2016-10-26 NOTE — Telephone Encounter (Signed)
Tylenol not asprin advil alleve

## 2016-10-26 NOTE — Telephone Encounter (Signed)
Message relayed to patient. Verbalized understanding and denied questions.   

## 2016-10-26 NOTE — Telephone Encounter (Signed)
Pt would like to know if she can take Glucosamine. Please advise.

## 2016-10-28 ENCOUNTER — Encounter: Payer: Self-pay | Admitting: Family Medicine

## 2016-10-28 ENCOUNTER — Ambulatory Visit (INDEPENDENT_AMBULATORY_CARE_PROVIDER_SITE_OTHER): Payer: Medicare Other | Admitting: Family Medicine

## 2016-10-28 VITALS — BP 124/77 | HR 63 | Temp 98.2°F | Wt 211.0 lb

## 2016-10-28 DIAGNOSIS — S39012A Strain of muscle, fascia and tendon of lower back, initial encounter: Secondary | ICD-10-CM

## 2016-10-28 DIAGNOSIS — M545 Low back pain: Secondary | ICD-10-CM

## 2016-10-28 MED ORDER — CYCLOBENZAPRINE HCL 10 MG PO TABS: 10.0000 mg | ORAL_TABLET | Freq: Three times a day (TID) | ORAL | 0 refills | Status: DC | PRN

## 2016-10-28 MED ORDER — TRIAMCINOLONE ACETONIDE 40 MG/ML IJ SUSP
40.0000 mg | Freq: Once | INTRAMUSCULAR | Status: AC
  Administered 2016-10-28: 40 mg via INTRAMUSCULAR

## 2016-10-28 NOTE — Progress Notes (Signed)
   BP 124/77   Pulse 63   Temp 98.2 F (36.8 C)   Wt 211 lb (95.7 kg)   SpO2 97%   BMI 34.90 kg/m    Subjective:    Patient ID: Elizabeth Malone, female    DOB: 09-11-1951, 66 y.o.   MRN: [id number]  HPI: Elizabeth Malone is a 66 y.o. female  Chief Complaint  Patient presents with  . Back Pain    x 1 day, reached under couch and felt it snap. Right lower back.    Patient presents with 1 day history of right LBP. Was reaching under the couch to grab a cord and felt a sudden severe pulling pain. Has been very sore and stiff since in that area. Denies radiation down legs, decreased ROM, numbness or tingling. Has tried hot/cold patches, ice pack, and tylenol with temporary mild relief.  No hx of back injuries.   Relevant past medical, surgical, family and social history reviewed and updated as indicated. Interim medical history since our last visit reviewed. Allergies and medications reviewed and updated.  Review of Systems  Constitutional: Negative.   HENT: Negative.   Respiratory: Negative.   Cardiovascular: Negative.   Gastrointestinal: Negative.   Genitourinary: Negative.   Musculoskeletal: Positive for back pain.  Neurological: Negative.   Psychiatric/Behavioral: Negative.     Per HPI unless specifically indicated above     Objective:    BP 124/77   Pulse 63   Temp 98.2 F (36.8 C)   Wt 211 lb (95.7 kg)   SpO2 97%   BMI 34.90 kg/m   Wt Readings from Last 3 Encounters:  10/28/16 211 lb (95.7 kg)  08/05/16 206 lb 14.4 oz (93.8 kg)  03/04/16 197 lb (89.4 kg)    Physical Exam  Constitutional: She is oriented to person, place, and time. She appears well-developed and well-nourished.  HENT:  Head: Atraumatic.  Eyes: Conjunctivae are normal. Pupils are equal, round, and reactive to light.  Neck: Normal range of motion. Neck supple.  Cardiovascular: Normal rate and normal heart sounds.   Pulmonary/Chest: Effort normal and breath sounds normal. No respiratory distress.   Musculoskeletal: Normal range of motion. She exhibits tenderness (TTP right lumbar paraspinal muscles). She exhibits no edema or deformity.  Neurological: She is alert and oriented to person, place, and time.  Skin: Skin is warm and dry.  Psychiatric: She has a normal mood and affect. Her behavior is normal.  Nursing note and vitals reviewed.     Assessment & Plan:   Problem List Items Addressed This Visit    None    Visit Diagnoses    Lumbar strain, initial encounter    -  Primary   Flexeril sent, IM kenalog given today. Discussed risks and benefits to the medications. Supportive care discussed - massage, gentle stretches, heating pads   Relevant Medications   triamcinolone acetonide (KENALOG-40) injection 40 mg (Completed)       Follow up plan: Return if symptoms worsen or fail to improve.

## 2016-10-28 NOTE — Patient Instructions (Signed)
Can take 4 doses daily of tylenol at [phone] mg doses

## 2016-11-16 ENCOUNTER — Ambulatory Visit (INDEPENDENT_AMBULATORY_CARE_PROVIDER_SITE_OTHER): Payer: Medicare Other | Admitting: Psychiatry

## 2016-11-16 ENCOUNTER — Encounter: Payer: Self-pay | Admitting: Psychiatry

## 2016-11-16 VITALS — BP 153/86 | HR 71 | Temp 97.5°F | Wt 209.4 lb

## 2016-11-16 DIAGNOSIS — F070 Personality change due to known physiological condition: Secondary | ICD-10-CM | POA: Diagnosis not present

## 2016-11-16 DIAGNOSIS — F3130 Bipolar disorder, current episode depressed, mild or moderate severity, unspecified: Secondary | ICD-10-CM

## 2016-11-16 MED ORDER — LITHIUM CARBONATE 300 MG PO TABS: 300.0000 mg | ORAL_TABLET | Freq: Every day | ORAL | 1 refills | Status: DC

## 2016-11-16 MED ORDER — GABAPENTIN 300 MG PO CAPS: 300.0000 mg | ORAL_CAPSULE | Freq: Three times a day (TID) | ORAL | 2 refills | Status: DC

## 2016-11-16 NOTE — Progress Notes (Signed)
[facility] MD Progress Note  11/16/2016 10:27 AM Elizabeth Malone  MRN:  [id number]   Subjective:    Patient is a 66 year-old married female with history of bipolar disorder who presented for follow-up appointment. Patient reported that she is still having pain and wants to go higher on the dose of the gabapentin. She reported that she has been compliant with her medications. She appeared calm and alert during the interview. She reported that she is planning for a cruise trip in April with her family. She has never been on the cruise before. She is anxious about the same and has received medication from her primary care physician. She stated that she is looking forward to the same but her husband will not be going with her as he is is anxious and has social phobia. She stated that she takes lithium 300 mg at bedtime. She denied having any manic symptoms at this time. She appeared calm and alert during the interview. She sleeps well at night.   She currently denied having any suicidal homicidal ideations or plans. She denied having any perceptual disturbances.    Principal Problem:  Diagnosis:   Patient Active Problem List   Diagnosis Date Noted  . Bipolar disorder ([place]) [F31.9] 08/05/2015  . Essential hypertension [I10] 08/05/2015  . Hypercholesteremia [E78.00] 08/05/2015  . CKD (chronic kidney disease) stage 3, GFR 30-59 ml/min [N18.3]     Past Medical History:  Past Medical History:  Diagnosis Date  . Anxiety   . Bipolar disorder ([place])   . CKD (chronic kidney disease) stage 3, GFR 30-59 ml/min   . Complication of anesthesia   . Depression   . History of diverticulosis   . Overactive bladder   . PONV (postoperative nausea and vomiting)   . Substance abuse    alcohol    Past Surgical History:  Procedure Laterality Date  . FOOT SURGERY Bilateral   . KNEE SURGERY Left   . TONSILLECTOMY AND ADENOIDECTOMY     Family History:  Family History  Problem Relation Age of Onset  . Alzheimer's  disease Mother   . Bipolar disorder Mother   . Depression Mother   . Heart attack Father   . Diabetes Brother   . Obesity Brother   . Depression Maternal Grandfather   . Depression Daughter   . Breast cancer Neg Hx    Family Psychiatric  History: Family history positive for multiple people with depression and a few people who have committed suicide. Social History:  History  Alcohol Use  . 1.8 oz/week  . 3 Shots of liquor per week     History  Drug Use No    Social History   Social History  . Marital status: Married    Spouse name: N/A  . Number of children: N/A  . Years of education: N/A   Social History Main Topics  . Smoking status: Former Smoker    Types: Cigarettes    Start date: 07/09/1967    Quit date: 07/08/1985  . Smokeless tobacco: Never Used  . Alcohol use 1.8 oz/week    3 Shots of liquor per week  . Drug use: No  . Sexual activity: Not Currently   Other Topics Concern  . None   Social History Narrative  . None   Additional Social History:                         Sleep: Poor  Appetite:  Good  Current Medications: Current Outpatient Prescriptions  Medication Sig Dispense Refill  . atorvastatin (LIPITOR) 20 MG tablet TAKE 1 TABLET BY MOUTH  DAILY AT 6PM 90 tablet 1  . benazepril (LOTENSIN) 5 MG tablet TAKE 1 TABLET BY MOUTH  DAILY 90 tablet 1  . busPIRone (BUSPAR) 15 MG tablet Take 1 tablet (15 mg total) by mouth 3 (three) times daily. 180 tablet 1  . Calcium Citrate-Vitamin D (CALCIUM + D PO) Take 1 tablet by mouth 2 (two) times daily.     . cyclobenzaprine (FLEXERIL) 10 MG tablet Take 1 tablet (10 mg total) by mouth 3 (three) times daily as needed for muscle spasms. 45 tablet 0  . gabapentin (NEURONTIN) 300 MG capsule Take 1 capsule (300 mg total) by mouth 2 (two) times daily. 180 capsule 2  . lithium 300 MG tablet Take 1 tablet (300 mg total) by mouth at bedtime. 90 tablet 1  . metoprolol succinate (TOPROL-XL) 25 MG 24 hr tablet TAKE  1 TABLET DAILY 90 tablet 1  . Omega-3 Fatty Acids (FISH OIL) 1000 MG CAPS Take 1,000 mg by mouth 2 (two) times daily.     . QUEtiapine (SEROQUEL) 400 MG tablet Take 1 tablet (400 mg total) by mouth at bedtime. 90 tablet 2  . scopolamine (TRANSDERM-SCOP, 1.5 MG,) 1 MG/3DAYS Place 1 patch (1.5 mg total) onto the skin every 3 (three) days. 3 patch 1  . topiramate (TOPAMAX) 50 MG tablet Take 1 tablet (50 mg total) by mouth daily. 90 tablet 1   No current facility-administered medications for this visit.     Lab Results: No results found for this or any previous visit (from the past 48 hour(s)).  Physical Findings: AIMS:  , ,  ,  ,    CIWA:    COWS:     Musculoskeletal: Strength & Muscle Tone: within normal limits Gait & Station: normal Patient leans: N/A  Psychiatric Specialty Exam: ROS  Blood pressure (!) 153/86, pulse 71, temperature 97.5 F (36.4 C), temperature source Oral, weight 209 lb 6.4 oz (95 kg).Body mass index is 34.63 kg/m.  General Appearance: Casual  Eye Contact::  Good  Speech:  Normal Rate  Volume:  Normal  Mood:  Anxious  Affect:  Congruent  Thought Process:  Coherent  Orientation:  Full (Time, Place, and Person)  Thought Content:  WDL  Suicidal Thoughts:  No  Homicidal Thoughts:  No  Memory:  Immediate;   Good Recent;   Good Remote;   Good  Judgement:  Fair  Insight:  Good  Psychomotor Activity:  Normal  Concentration:  Fair  Recall:  [employer] of Knowledge:Fair  Language: Fair  Akathisia:  No  Handed:  Right  AIMS (if indicated):     Assets:  Communication Skills Desire for Improvement Financial Resources/Insurance Housing Intimacy Leisure Time [place] Talents/Skills Transportation  ADL's:  Intact  Cognition: WNL  Sleep:      Treatment Plan Summary:  Discussed with patient at length about the medications treatment risks benefits and alternatives  Mood swings She will continue on Seroquel 400 mg on  a daily basis. She will also continue on lithium 300 mg qhs  Will continue on gabapentin 300 mg by mouth TID  a day- pt has supply   Anxiety She will  continue on buspirone 15 mg by mouth 3 times a day-  refilled.  Continue Topamax 50 mg daily.    She will follow-up in 3  months.  More than 50% of the time spent in psychoeducation, counseling and coordination of care.    This note was generated in part or whole with voice recognition software. Voice regonition is usually quite accurate but there are transcription errors that can and very often do occur. I apologize for any typographical errors that were not detected and corrected.     Rainey Pines, MD  11/16/2016, 10:27 AM  Medication Refill

## 2016-11-30 ENCOUNTER — Telehealth: Payer: Self-pay

## 2016-11-30 NOTE — Telephone Encounter (Signed)
Patient is experiencing dizziness when she turns her head and feels like she could fall out.  Patient's blood pressure has been running high. 145/90 appx.  Patient lost 40lbs but has gained it back and thinks her BP is contributing to this.  Wanted to see if her BP medication needed to be higher dose.

## 2016-11-30 NOTE — Telephone Encounter (Signed)
Pt scheduled for OV tomorrow afternoon.

## 2016-12-01 ENCOUNTER — Ambulatory Visit (INDEPENDENT_AMBULATORY_CARE_PROVIDER_SITE_OTHER): Payer: Medicare Other | Admitting: Family Medicine

## 2016-12-01 ENCOUNTER — Encounter: Payer: Self-pay | Admitting: Family Medicine

## 2016-12-01 DIAGNOSIS — I10 Essential (primary) hypertension: Secondary | ICD-10-CM | POA: Diagnosis not present

## 2016-12-01 DIAGNOSIS — H8110 Benign paroxysmal vertigo, unspecified ear: Secondary | ICD-10-CM

## 2016-12-01 DIAGNOSIS — IMO0002 Reserved for concepts with insufficient information to code with codable children: Secondary | ICD-10-CM | POA: Insufficient documentation

## 2016-12-01 NOTE — Progress Notes (Signed)
Ht 5\' 5"  (1.651 m)   Wt 209 lb (94.8 kg)   BMI 34.78 kg/m    Subjective:    Patient ID: Elizabeth Malone, female    DOB: 1950/12/01, 66 y.o.   MRN: [id number]  HPI: Elizabeth Malone is a 66 y.o. female  Chief Complaint  Patient presents with  . Hypertension  . Dizziness  . Neck Pain  . Skin check    legs   Patient with positional vertigo is been ongoing for several months getting somewhat worse. Patient especially with head movement lying to sitting. No palpitations no change in blood pressure with lying to sitting standing the blood pressure is elevated. Patient taking Benzapril 5 mg without problems. Is concerned about increasing dose because of previous renal problems. Patient also with lesions on her legs with inspection there seborrheic keratosis will observe and leave alone. Relevant past medical, surgical, family and social history reviewed and updated as indicated. Interim medical history since our last visit reviewed. Allergies and medications reviewed and updated.  Review of Systems  Constitutional: Negative.   HENT: Negative.   Eyes: Negative.   Respiratory: Negative.   Cardiovascular: Negative.     Per HPI unless specifically indicated above     Objective:    Ht 5\' 5"  (1.651 m)   Wt 209 lb (94.8 kg)   BMI 34.78 kg/m   Wt Readings from Last 3 Encounters:  12/01/16 209 lb (94.8 kg)  10/28/16 211 lb (95.7 kg)  08/05/16 206 lb 14.4 oz (93.8 kg)    Physical Exam  Constitutional: She is oriented to person, place, and time. She appears well-developed and well-nourished.  HENT:  Head: Normocephalic and atraumatic.  Right Ear: External ear normal.  Left Ear: External ear normal.  Nose: Nose normal.  Mouth/Throat: Oropharynx is clear and moist.  Eyes: Conjunctivae and EOM are normal. Pupils are equal, round, and reactive to light.  Neck: Normal range of motion. Neck supple.  Cardiovascular: Normal rate, regular rhythm and normal heart sounds.     Pulmonary/Chest: Effort normal and breath sounds normal.  Musculoskeletal: Normal range of motion.  Neurological: She is alert and oriented to person, place, and time.  Skin: No erythema.  Psychiatric: She has a normal mood and affect. Her behavior is normal. Judgment and thought content normal.    Results for orders placed or performed in visit on 62/83/66  Basic metabolic panel  Result Value Ref Range   Glucose 111 (H) 65 - 99 mg/dL   BUN 17 8 - 27 mg/dL   Creatinine, Ser 0.96 0.57 - 1.00 mg/dL   GFR calc non Af Amer 62 >59 mL/min/1.73   GFR calc Af Amer 72 >59 mL/min/1.73   BUN/Creatinine Ratio 18 12 - 28   Sodium 140 134 - 144 mmol/L   Potassium 4.5 3.5 - 5.2 mmol/L   Chloride 103 96 - 106 mmol/L   CO2 23 18 - 29 mmol/L   Calcium 10.4 (H) 8.7 - 10.3 mg/dL  Lithium level  Result Value Ref Range   Lithium Lvl 0.3 (L) 0.6 - 1.2 mmol/L      Assessment & Plan:   Problem List Items Addressed This Visit      Cardiovascular and Mediastinum   Essential hypertension    Elevated blood pressure will increase Benzapril from 5 to 10 mg Will recheck in 1 month or so with BMP also.        Other   Positional vertigo    Discuss  positional vertigo will do referral to ear nose and throat to further evaluate see if she is a candidate for repositioning therapy.      Relevant Orders   Ambulatory referral to ENT       Follow up plan: Return in about 2 months (around 01/31/2017) for Physical Exam.

## 2016-12-01 NOTE — Assessment & Plan Note (Signed)
Elevated blood pressure will increase Benzapril from 5 to 10 mg Will recheck in 1 month or so with BMP also.

## 2016-12-01 NOTE — Assessment & Plan Note (Signed)
Discuss positional vertigo will do referral to ear nose and throat to further evaluate see if she is a candidate for repositioning therapy.

## 2016-12-15 ENCOUNTER — Other Ambulatory Visit: Payer: Self-pay | Admitting: Psychiatry

## 2017-01-04 ENCOUNTER — Other Ambulatory Visit: Payer: Self-pay | Admitting: Psychiatry

## 2017-01-04 MED ORDER — TOPIRAMATE 50 MG PO TABS: 50.0000 mg | ORAL_TABLET | Freq: Every day | ORAL | 1 refills | Status: DC

## 2017-02-02 ENCOUNTER — Ambulatory Visit (INDEPENDENT_AMBULATORY_CARE_PROVIDER_SITE_OTHER): Payer: Medicare Other | Admitting: Family Medicine

## 2017-02-02 ENCOUNTER — Encounter: Payer: Self-pay | Admitting: Family Medicine

## 2017-02-02 VITALS — BP 120/81 | HR 76 | Ht 66.0 in | Wt 211.4 lb

## 2017-02-02 DIAGNOSIS — N183 Chronic kidney disease, stage 3 unspecified: Secondary | ICD-10-CM

## 2017-02-02 DIAGNOSIS — F3132 Bipolar disorder, current episode depressed, moderate: Secondary | ICD-10-CM | POA: Diagnosis not present

## 2017-02-02 DIAGNOSIS — Z7189 Other specified counseling: Secondary | ICD-10-CM | POA: Insufficient documentation

## 2017-02-02 DIAGNOSIS — I10 Essential (primary) hypertension: Secondary | ICD-10-CM

## 2017-02-02 DIAGNOSIS — Z23 Encounter for immunization: Secondary | ICD-10-CM

## 2017-02-02 DIAGNOSIS — E7800 Pure hypercholesterolemia, unspecified: Secondary | ICD-10-CM

## 2017-02-02 DIAGNOSIS — Z124 Encounter for screening for malignant neoplasm of cervix: Secondary | ICD-10-CM

## 2017-02-02 DIAGNOSIS — E039 Hypothyroidism, unspecified: Secondary | ICD-10-CM | POA: Diagnosis not present

## 2017-02-02 DIAGNOSIS — Z1211 Encounter for screening for malignant neoplasm of colon: Secondary | ICD-10-CM

## 2017-02-02 DIAGNOSIS — Z0000 Encounter for general adult medical examination without abnormal findings: Secondary | ICD-10-CM

## 2017-02-02 LAB — MICROSCOPIC EXAMINATION

## 2017-02-02 LAB — URINALYSIS, ROUTINE W REFLEX MICROSCOPIC
Bilirubin, UA: NEGATIVE
Glucose, UA: NEGATIVE
KETONES UA: NEGATIVE
NITRITE UA: NEGATIVE
PROTEIN UA: NEGATIVE
SPEC GRAV UA: 1.02 (ref 1.005–1.030)
Urobilinogen, Ur: 0.2 mg/dL (ref 0.2–1.0)
pH, UA: 5 (ref 5.0–7.5)

## 2017-02-02 MED ORDER — ATORVASTATIN CALCIUM 20 MG PO TABS: 20.0000 mg | ORAL_TABLET | Freq: Every day | ORAL | 4 refills | Status: DC

## 2017-02-02 MED ORDER — BENAZEPRIL HCL 10 MG PO TABS: 10.0000 mg | ORAL_TABLET | Freq: Every day | ORAL | 4 refills | Status: DC

## 2017-02-02 MED ORDER — ZOSTER VAC RECOMB ADJUVANTED 50 MCG/0.5ML IM SUSR: 0.5000 mL | Freq: Once | INTRAMUSCULAR | 0 refills | Status: AC

## 2017-02-02 MED ORDER — METOPROLOL SUCCINATE ER 25 MG PO TB24: 25.0000 mg | ORAL_TABLET | Freq: Every day | ORAL | 4 refills | Status: DC

## 2017-02-02 NOTE — Assessment & Plan Note (Signed)
A voluntary discussion about advance care planning including the explanation and discussion of advance directives was extensively discussed  with the patient.  Explanation about the health care proxy and Living will was reviewed and packet with forms with explanation of how to fill them out was given.    

## 2017-02-02 NOTE — Assessment & Plan Note (Signed)
The current medical regimen is effective;  continue present plan and medications.  

## 2017-02-02 NOTE — Progress Notes (Signed)
BP 120/81   Pulse 76   Ht _0  (1.676 m)   Wt 211 lb 6.4 oz (95.9 kg)   SpO2 98%   BMI 34.12 kg/m    Subjective:    Patient ID: Elizabeth Malone, female    DOB: 10-12-1950, 66 y.o.   MRN: [id number]  HPI: Elizabeth Malone is a 66 y.o. female  Chief Complaint  Patient presents with  . Annual Exam  AWV metrics met Patient doing well with medications bipolar is most stable it's been for years Patient's CK D taking lithium 301 today and stable on this lower dose. Blood pressure also doing well. With lower dose of medications patient shaking and stuttering has stopped. No further syncopal spells.  Relevant past medical, surgical, family and social history reviewed and updated as indicated. Interim medical history since our last visit reviewed. Allergies and medications reviewed and updated.  Review of Systems  Constitutional: Negative.   HENT: Negative.   Eyes: Negative.   Respiratory: Negative.   Cardiovascular: Negative.   Gastrointestinal: Negative.   Endocrine: Negative.   Genitourinary: Negative.   Musculoskeletal: Negative.   Skin: Negative.   Allergic/Immunologic: Negative.   Neurological: Negative.   Hematological: Negative.   Psychiatric/Behavioral: Negative.     Per HPI unless specifically indicated above     Objective:    BP 120/81   Pulse 76   Ht _1  (1.676 m)   Wt 211 lb 6.4 oz (95.9 kg)   SpO2 98%   BMI 34.12 kg/m   Wt Readings from Last 3 Encounters:  02/02/17 211 lb 6.4 oz (95.9 kg)  12/01/16 209 lb (94.8 kg)  10/28/16 211 lb (95.7 kg)    Physical Exam  Constitutional: She is oriented to person, place, and time. She appears well-developed and well-nourished.  HENT:  Head: Normocephalic and atraumatic.  Right Ear: External ear normal.  Left Ear: External ear normal.  Nose: Nose normal.  Mouth/Throat: Oropharynx is clear and moist.  Eyes: Conjunctivae and EOM are normal. Pupils are equal, round, and reactive to light.  Neck: Normal range  of motion. Neck supple. Carotid bruit is not present.  Cardiovascular: Normal rate, regular rhythm and normal heart sounds.   No murmur heard. Pulmonary/Chest: Effort normal and breath sounds normal. She exhibits no mass. Right breast exhibits no mass, no skin change and no tenderness. Left breast exhibits no mass, no skin change and no tenderness. Breasts are symmetrical.  Abdominal: Soft. Bowel sounds are normal. There is no hepatosplenomegaly.  Genitourinary: Vagina normal and uterus normal.  Genitourinary Comments: Pap done  Musculoskeletal: Normal range of motion.  Neurological: She is alert and oriented to person, place, and time.  Skin: No rash noted.  Psychiatric: She has a normal mood and affect. Her behavior is normal. Judgment and thought content normal.    Results for orders placed or performed in visit on 15/17/61  Basic metabolic panel  Result Value Ref Range   Glucose 111 (H) 65 - 99 mg/dL   BUN 17 8 - 27 mg/dL   Creatinine, Ser 0.96 0.57 - 1.00 mg/dL   GFR calc non Af Amer 62 >59 mL/min/1.73   GFR calc Af Amer 72 >59 mL/min/1.73   BUN/Creatinine Ratio 18 12 - 28   Sodium 140 134 - 144 mmol/L   Potassium 4.5 3.5 - 5.2 mmol/L   Chloride 103 96 - 106 mmol/L   CO2 23 18 - 29 mmol/L   Calcium 10.4 (H) 8.7 - 10.3  mg/dL  Lithium level  Result Value Ref Range   Lithium Lvl 0.3 (L) 0.6 - 1.2 mmol/L      Assessment & Plan:   Problem List Items Addressed This Visit      Cardiovascular and Mediastinum   Essential hypertension    The current medical regimen is effective;  continue present plan and medications.       Relevant Medications   benazepril (LOTENSIN) 10 MG tablet   atorvastatin (LIPITOR) 20 MG tablet   metoprolol succinate (TOPROL-XL) 25 MG 24 hr tablet   Other Relevant Orders   CBC with Differential/Platelet   Comprehensive metabolic panel   Urinalysis, Routine w reflex microscopic     Endocrine   RESOLVED: Hypothyroidism   Relevant Medications    metoprolol succinate (TOPROL-XL) 25 MG 24 hr tablet   Other Relevant Orders   TSH     Genitourinary   CKD (chronic kidney disease) stage 3, GFR 30-59 ml/min    The current medical regimen is effective;  continue present plan and medications.       Relevant Orders   CBC with Differential/Platelet   Comprehensive metabolic panel   Urinalysis, Routine w reflex microscopic     Other   Bipolar disorder ([place])    The current medical regimen is effective;  continue present plan and medications.       Hypercholesteremia    The current medical regimen is effective;  continue present plan and medications.       Relevant Medications   benazepril (LOTENSIN) 10 MG tablet   atorvastatin (LIPITOR) 20 MG tablet   metoprolol succinate (TOPROL-XL) 25 MG 24 hr tablet   Other Relevant Orders   CBC with Differential/Platelet   Comprehensive metabolic panel   Lipid panel   Urinalysis, Routine w reflex microscopic   Advanced care planning/counseling discussion    A voluntary discussion about advance care planning including the explanation and discussion of advance directives was extensively discussed  with the patient.  Explanation about the health care proxy and Living will was reviewed and packet with forms with explanation of how to fill them out was given.         Other Visit Diagnoses    Medicare annual wellness visit, initial    -  Primary   Relevant Orders   CBC with Differential/Platelet   Comprehensive metabolic panel   Lipid panel   TSH   Urinalysis, Routine w reflex microscopic   Immunization due       Relevant Medications   Zoster Vac Recomb Adjuvanted [facility]) injection   Colon cancer screening       Relevant Orders   Ambulatory referral to Gastroenterology   Cervical cancer screening       Relevant Orders   IGP, Aptima HPV, rfx 16/18,45       Follow up plan: Return in about 6 months (around 08/05/2017) for BMP,  Lipids, ALT, AST, Hemoglobin A1c.

## 2017-02-03 ENCOUNTER — Encounter: Payer: Self-pay | Admitting: Family Medicine

## 2017-02-03 LAB — COMPREHENSIVE METABOLIC PANEL
A/G RATIO: 1.9 (ref 1.2–2.2)
ALT: 27 IU/L (ref 0–32)
AST: 20 IU/L (ref 0–40)
Albumin: 4.4 g/dL (ref 3.6–4.8)
Alkaline Phosphatase: 78 IU/L (ref 39–117)
BILIRUBIN TOTAL: 0.2 mg/dL (ref 0.0–1.2)
BUN/Creatinine Ratio: 17 (ref 12–28)
BUN: 15 mg/dL (ref 8–27)
CALCIUM: 9.3 mg/dL (ref 8.7–10.3)
CHLORIDE: 106 mmol/L (ref 96–106)
CO2: 24 mmol/L (ref 18–29)
Creatinine, Ser: 0.88 mg/dL (ref 0.57–1.00)
GFR, EST AFRICAN AMERICAN: 80 mL/min/{1.73_m2} (ref >59)
GFR, EST NON AFRICAN AMERICAN: 69 mL/min/{1.73_m2} (ref >59)
Globulin, Total: 2.3 g/dL (ref 1.5–4.5)
Glucose: 113 mg/dL — ABNORMAL HIGH (ref 65–99)
POTASSIUM: 4.9 mmol/L (ref 3.5–5.2)
Sodium: 144 mmol/L (ref 134–144)
TOTAL PROTEIN: 6.7 g/dL (ref 6.0–8.5)

## 2017-02-03 LAB — CBC WITH DIFFERENTIAL/PLATELET
BASOS: 0 %
Basophils Absolute: 0 10*3/uL (ref 0.0–0.2)
EOS (ABSOLUTE): 0.2 10*3/uL (ref 0.0–0.4)
EOS: 3 %
HEMOGLOBIN: 13.8 g/dL (ref 11.1–15.9)
Hematocrit: 41.5 % (ref 34.0–46.6)
IMMATURE GRANS (ABS): 0 10*3/uL (ref 0.0–0.1)
Immature Granulocytes: 0 %
LYMPHS: 26 %
Lymphocytes Absolute: 1.6 10*3/uL (ref 0.7–3.1)
MCH: 30.7 pg (ref 26.6–33.0)
MCHC: 33.3 g/dL (ref 31.5–35.7)
MCV: 92 fL (ref 79–97)
Monocytes Absolute: 0.4 10*3/uL (ref 0.1–0.9)
Monocytes: 6 %
NEUTROS ABS: 4.1 10*3/uL (ref 1.4–7.0)
Neutrophils: 65 %
Platelets: 230 10*3/uL (ref 150–379)
RBC: 4.49 x10E6/uL (ref 3.77–5.28)
RDW: 13.7 % (ref 12.3–15.4)
WBC: 6.3 10*3/uL (ref 3.4–10.8)

## 2017-02-03 LAB — LIPID PANEL
CHOL/HDL RATIO: 2.8 ratio (ref 0.0–4.4)
Cholesterol, Total: 155 mg/dL (ref 100–199)
HDL: 56 mg/dL (ref >39)
LDL Calculated: 70 mg/dL (ref 0–99)
Triglycerides: 145 mg/dL (ref 0–149)
VLDL CHOLESTEROL CAL: 29 mg/dL (ref 5–40)

## 2017-02-03 LAB — TSH: TSH: 1.2 u[IU]/mL (ref 0.450–4.500)

## 2017-02-04 ENCOUNTER — Telehealth: Payer: Self-pay | Admitting: Gastroenterology

## 2017-02-04 NOTE — Telephone Encounter (Signed)
Spoke with pt regarding insurance covering Colonoscopy.  I asked her to contact her insurance company to ask if they will cover if her colonoscopy is done prior to 07/01/17. She will contact the office to schedule colonoscopy once she speaks with insurance.

## 2017-02-04 NOTE — Telephone Encounter (Signed)
Patient is returning your call regarding a colonoscopy. Please call [phone]

## 2017-02-10 LAB — IGP, APTIMA HPV, RFX 16/18,45
HPV Aptima: POSITIVE — AB
HPV Genotype 16: POSITIVE — AB
HPV Genotype 18,45: NEGATIVE
PAP Smear Comment: 0

## 2017-02-15 ENCOUNTER — Ambulatory Visit (INDEPENDENT_AMBULATORY_CARE_PROVIDER_SITE_OTHER): Payer: 59 | Admitting: Psychiatry

## 2017-02-15 ENCOUNTER — Encounter: Payer: Self-pay | Admitting: Psychiatry

## 2017-02-15 VITALS — BP 132/80 | HR 80 | Temp 98.0°F | Wt 211.8 lb

## 2017-02-15 DIAGNOSIS — F3130 Bipolar disorder, current episode depressed, mild or moderate severity, unspecified: Secondary | ICD-10-CM | POA: Diagnosis not present

## 2017-02-15 DIAGNOSIS — F070 Personality change due to known physiological condition: Secondary | ICD-10-CM | POA: Diagnosis not present

## 2017-02-15 MED ORDER — GABAPENTIN 300 MG PO CAPS: 300.0000 mg | ORAL_CAPSULE | Freq: Three times a day (TID) | ORAL | 2 refills | Status: DC

## 2017-02-15 MED ORDER — LITHIUM CARBONATE 300 MG PO TABS: 300.0000 mg | ORAL_TABLET | Freq: Every day | ORAL | 1 refills | Status: DC

## 2017-02-15 MED ORDER — QUETIAPINE FUMARATE 400 MG PO TABS: 400.0000 mg | ORAL_TABLET | Freq: Every day | ORAL | 2 refills | Status: DC

## 2017-02-15 MED ORDER — TOPIRAMATE 50 MG PO TABS: 50.0000 mg | ORAL_TABLET | Freq: Every day | ORAL | 1 refills | Status: DC

## 2017-02-15 MED ORDER — BUSPIRONE HCL 15 MG PO TABS: 15.0000 mg | ORAL_TABLET | Freq: Three times a day (TID) | ORAL | 1 refills | Status: DC

## 2017-02-15 NOTE — Progress Notes (Signed)
[facility] MD Progress Note  02/15/2017 10:35 AM Elizabeth Malone  MRN:  [id number]   Subjective:    Patient is a 66 year-old married female with history of bipolar disorder who presented for follow-up appointment. Patient reported that she is doing well on the current combination the medications. She just had a physical exam done and was discussing that in detail. She reported that when she was following with Dr. Bridgett Larsson he used to change her medications at every appointment and since she has started coming over here her medications have been stable and she does not feel suicidal and this combination of medication has worked well for her. She was very happy and excited about the same. She reported that she enjoyed her cruise trip with her family. Patient reported that she is having some family issues as her uncle is on the death bed and she went to see him but her and is not allowing her to see more often. She was discussing that in detail. She denied having any perceptual disturbances. She reported that she sleeps well at night. We discussed about her medications in detail. She denied having any suicidal homicidal ideations or plans.      Principal Problem:  Diagnosis:   Patient Active Problem List   Diagnosis Date Noted  . Advanced care planning/counseling discussion [Z71.89] 02/02/2017  . Bipolar disorder ([place]) [F31.9] 08/05/2015  . Essential hypertension [I10] 08/05/2015  . Hypercholesteremia [E78.00] 08/05/2015  . CKD (chronic kidney disease) stage 3, GFR 30-59 ml/min [N18.3]     Past Medical History:  Past Medical History:  Diagnosis Date  . Anxiety   . Bipolar disorder ([place])   . CKD (chronic kidney disease) stage 3, GFR 30-59 ml/min   . Complication of anesthesia   . Depression   . History of diverticulosis   . Overactive bladder   . PONV (postoperative nausea and vomiting)   . Substance abuse    alcohol    Past Surgical History:  Procedure Laterality Date  . FOOT SURGERY Bilateral    . KNEE SURGERY Left   . TONSILLECTOMY AND ADENOIDECTOMY     Family History:  Family History  Problem Relation Age of Onset  . Alzheimer's disease Mother   . Bipolar disorder Mother   . Depression Mother   . Heart attack Father   . Diabetes Brother   . Obesity Brother   . Depression Maternal Grandfather   . Depression Daughter   . Breast cancer Neg Hx    Family Psychiatric  History: Family history positive for multiple people with depression and a few people who have committed suicide. Social History:  History  Alcohol Use  . 1.8 oz/week  . 3 Shots of liquor per week     History  Drug Use No    Social History   Social History  . Marital status: Married    Spouse name: N/A  . Number of children: N/A  . Years of education: N/A   Social History Main Topics  . Smoking status: Former Smoker    Types: Cigarettes    Start date: 07/09/1967    Quit date: 07/08/1985  . Smokeless tobacco: Never Used  . Alcohol use 1.8 oz/week    3 Shots of liquor per week  . Drug use: No  . Sexual activity: Not Currently   Other Topics Concern  . None   Social History Narrative  . None   Additional Social History:  Sleep: Poor  Appetite:  Good  Current Medications: Current Outpatient Prescriptions  Medication Sig Dispense Refill  . atorvastatin (LIPITOR) 20 MG tablet Take 1 tablet (20 mg total) by mouth daily. 90 tablet 4  . benazepril (LOTENSIN) 10 MG tablet Take 1 tablet (10 mg total) by mouth daily. 90 tablet 4  . busPIRone (BUSPAR) 15 MG tablet Take 1 tablet (15 mg total) by mouth 3 (three) times daily. 180 tablet 1  . Calcium Citrate-Vitamin D (CALCIUM + D PO) Take 1 tablet by mouth 2 (two) times daily.     . cyclobenzaprine (FLEXERIL) 10 MG tablet Take 1 tablet (10 mg total) by mouth 3 (three) times daily as needed for muscle spasms. 45 tablet 0  . gabapentin (NEURONTIN) 300 MG capsule Take 1 capsule (300 mg total) by mouth 3 (three) times  daily. Pt has just received the medication- not refilled 3/5 180 capsule 2  . lithium 300 MG tablet Take 1 tablet (300 mg total) by mouth at bedtime. 90 tablet 1  . metoprolol succinate (TOPROL-XL) 25 MG 24 hr tablet Take 1 tablet (25 mg total) by mouth daily. 90 tablet 4  . Omega-3 Fatty Acids (FISH OIL) 1000 MG CAPS Take 1,000 mg by mouth 2 (two) times daily.     . QUEtiapine (SEROQUEL) 400 MG tablet Take 1 tablet (400 mg total) by mouth at bedtime. 90 tablet 2  . scopolamine (TRANSDERM-SCOP, 1.5 MG,) 1 MG/3DAYS Place 1 patch (1.5 mg total) onto the skin every 3 (three) days. 3 patch 1  . topiramate (TOPAMAX) 50 MG tablet Take 1 tablet (50 mg total) by mouth daily. 90 tablet 1   No current facility-administered medications for this visit.     Lab Results: No results found for this or any previous visit (from the past 48 hour(s)).  Physical Findings: AIMS:  , ,  ,  ,    CIWA:    COWS:     Musculoskeletal: Strength & Muscle Tone: within normal limits Gait & Station: normal Patient leans: N/A  Psychiatric Specialty Exam: ROS  Blood pressure 132/80, pulse 80, temperature 98 F (36.7 C), temperature source Oral, weight 211 lb 12.8 oz (96.1 kg).Body mass index is 34.19 kg/m.  General Appearance: Casual  Eye Contact::  Good  Speech:  Normal Rate  Volume:  Normal  Mood:  Anxious  Affect:  Congruent  Thought Process:  Coherent  Orientation:  Full (Time, Place, and Person)  Thought Content:  WDL  Suicidal Thoughts:  No  Homicidal Thoughts:  No  Memory:  Immediate;   Good Recent;   Good Remote;   Good  Judgement:  Fair  Insight:  Good  Psychomotor Activity:  Normal  Concentration:  Fair  Recall:  [employer] of Knowledge:Fair  Language: Fair  Akathisia:  No  Handed:  Right  AIMS (if indicated):     Assets:  Communication Skills Desire for Improvement Financial Resources/Insurance Housing Intimacy Leisure Time [place] [employer]/Skills Transportation  ADL's:  Intact  Cognition: WNL  Sleep:      Treatment Plan Summary:  Discussed with patient at length about the medications treatment risks benefits and alternatives  Mood swings She will continue on Seroquel 400 mg on a daily basis. She will also continue on lithium 300 mg qhs  Will continue on gabapentin 300 mg by mouth TID  a day-   Anxiety She will  continue on buspirone 15 mg by mouth 3 times a day-  refilled.  Continue Topamax 50 mg daily.    She will follow-up in 3  months.    More than 50% of the time spent in psychoeducation, counseling and coordination of care.    This note was generated in part or whole with voice recognition software. Voice regonition is usually quite accurate but there are transcription errors that can and very often do occur. I apologize for any typographical errors that were not detected and corrected.     Rainey Pines, MD  02/15/2017, 10:35 AM  Medication Refill

## 2017-02-19 ENCOUNTER — Encounter: Payer: Self-pay | Admitting: Family Medicine

## 2017-03-04 NOTE — Telephone Encounter (Signed)
Message closed after 2 months of no response from dr. Gretel Acre. Pt was seen in the office on  11-16-16, 02-15-17 and next appt 05-10-17

## 2017-05-10 ENCOUNTER — Ambulatory Visit: Payer: Medicare Other | Admitting: Psychiatry

## 2017-05-18 ENCOUNTER — Encounter: Payer: Self-pay | Admitting: Psychiatry

## 2017-05-18 ENCOUNTER — Ambulatory Visit (INDEPENDENT_AMBULATORY_CARE_PROVIDER_SITE_OTHER): Payer: Medicare Other | Admitting: Psychiatry

## 2017-05-18 VITALS — BP 129/84 | HR 70 | Temp 99.1°F | Wt 215.4 lb

## 2017-05-18 DIAGNOSIS — F3130 Bipolar disorder, current episode depressed, mild or moderate severity, unspecified: Secondary | ICD-10-CM

## 2017-05-18 MED ORDER — GABAPENTIN 300 MG PO CAPS: 300.0000 mg | ORAL_CAPSULE | Freq: Three times a day (TID) | ORAL | 2 refills | Status: DC

## 2017-05-18 MED ORDER — TRAZODONE HCL 100 MG PO TABS: 100.0000 mg | ORAL_TABLET | Freq: Every day | ORAL | 1 refills | Status: DC

## 2017-05-18 MED ORDER — LITHIUM CARBONATE 300 MG PO TABS: 300.0000 mg | ORAL_TABLET | Freq: Every day | ORAL | 1 refills | Status: DC

## 2017-05-18 MED ORDER — TOPIRAMATE 100 MG PO TABS: 100.0000 mg | ORAL_TABLET | Freq: Every day | ORAL | 1 refills | Status: DC

## 2017-05-18 MED ORDER — QUETIAPINE FUMARATE 200 MG PO TABS: 200.0000 mg | ORAL_TABLET | Freq: Every day | ORAL | 1 refills | Status: DC

## 2017-05-18 MED ORDER — BUSPIRONE HCL 15 MG PO TABS: 15.0000 mg | ORAL_TABLET | Freq: Three times a day (TID) | ORAL | 1 refills | Status: DC

## 2017-05-18 NOTE — Progress Notes (Signed)
[facility] MD Progress Note  05/18/2017 10:22 AM Elizabeth Malone  MRN:  [id number]   Subjective:    Patient is a 66 year-old married female with history of bipolar disorder who presented for follow-up appointment. Patient reported [place] and enjoyed her trip. She reported that she has been compliant with her medications. She stated that she has gained weight and wants to decrease the dose of Seroquel. She is interested in going higher on the dose of Topamax. She stated that she has not received gabapentin 3 times per day from the insurance although she has been taking it as prescribed. We discussed about her medications in detail. She reported that she is having some relationship issues with her husband. She is trying to improve her relationship. She currently denied having any suicidal ideations or plans. She appeared calm and alert during the interview. We discussed about her medications in detail. Patient reported that Dr. Vallarie Mare used to give her medication to help with the depression which was very helpful. She is going to look for the medication bottle and will call me about the antidepressant medication. She sleeps well at this time. She stated that she wants to decrease the Seroquel to lose weight and will add trazodone at this time.  She has been trying to lose weight. She appeared motivated. She denied having any perceptual disturbances. She denied having any suicidal homicidal ideations or plans.      Principal Problem:  Diagnosis:   Patient Active Problem List   Diagnosis Date Noted  . Advanced care planning/counseling discussion [Z71.89] 02/02/2017  . Bipolar disorder ([place]) [F31.9] 08/05/2015  . Essential hypertension [I10] 08/05/2015  . Hypercholesteremia [E78.00] 08/05/2015  . CKD (chronic kidney disease) stage 3, GFR 30-59 ml/min [N18.3]     Past Medical History:  Past Medical History:  Diagnosis Date  . Anxiety   . Bipolar disorder  ([place])   . CKD (chronic kidney disease) stage 3, GFR 30-59 ml/min   . Complication of anesthesia   . Depression   . History of diverticulosis   . Overactive bladder   . PONV (postoperative nausea and vomiting)   . Substance abuse    alcohol    Past Surgical History:  Procedure Laterality Date  . FOOT SURGERY Bilateral   . KNEE SURGERY Left   . TONSILLECTOMY AND ADENOIDECTOMY     Family History:  Family History  Problem Relation Age of Onset  . Alzheimer's disease Mother   . Bipolar disorder Mother   . Depression Mother   . Heart attack Father   . Diabetes Brother   . Obesity Brother   . Depression Maternal Grandfather   . Depression Daughter   . Breast cancer Neg Hx    Family Psychiatric  History: Family history positive for multiple people with depression and a few people who have committed suicide. Social History:  History  Alcohol Use  . 1.8 oz/week  . 3 Shots of liquor per week     History  Drug Use No    Social History   Social History  . Marital status: Married    Spouse name: N/A  . Number of children: N/A  . Years of education: N/A   Social History Main Topics  . Smoking status: Former Smoker    Types: Cigarettes    Start date: 07/09/1967    Quit date: 07/08/1985  . Smokeless tobacco: Never Used  . Alcohol use 1.8 oz/week  3 Shots of liquor per week  . Drug use: No  . Sexual activity: Not Currently   Other Topics Concern  . None   Social History Narrative  . None   Additional Social History:                         Sleep: Fair  Appetite:  Fair  Current Medications: Current Outpatient Prescriptions  Medication Sig Dispense Refill  . atorvastatin (LIPITOR) 20 MG tablet Take 1 tablet (20 mg total) by mouth daily. 90 tablet 4  . benazepril (LOTENSIN) 10 MG tablet Take 1 tablet (10 mg total) by mouth daily. 90 tablet 4  . busPIRone (BUSPAR) 15 MG tablet Take 1 tablet (15 mg total) by mouth 3 (three) times daily. 270 tablet 1   . Calcium Citrate-Vitamin D (CALCIUM + D PO) Take 1 tablet by mouth 2 (two) times daily.     . cyclobenzaprine (FLEXERIL) 10 MG tablet Take 1 tablet (10 mg total) by mouth 3 (three) times daily as needed for muscle spasms. 45 tablet 0  . gabapentin (NEURONTIN) 300 MG capsule Take 1 capsule (300 mg total) by mouth 3 (three) times daily. 180 capsule 2  . lithium 300 MG tablet Take 1 tablet (300 mg total) by mouth at bedtime. 90 tablet 1  . metoprolol succinate (TOPROL-XL) 25 MG 24 hr tablet Take 1 tablet (25 mg total) by mouth daily. 90 tablet 4  . Omega-3 Fatty Acids (FISH OIL) 1000 MG CAPS Take 1,000 mg by mouth 2 (two) times daily.     . QUEtiapine (SEROQUEL) 400 MG tablet Take 1 tablet (400 mg total) by mouth at bedtime. 90 tablet 2  . scopolamine (TRANSDERM-SCOP, 1.5 MG,) 1 MG/3DAYS Place 1 patch (1.5 mg total) onto the skin every 3 (three) days. 3 patch 1  . topiramate (TOPAMAX) 50 MG tablet Take 1 tablet (50 mg total) by mouth daily. 90 tablet 1   No current facility-administered medications for this visit.     Lab Results: No results found for this or any previous visit (from the past 48 hour(s)).  Physical Findings: AIMS:  , ,  ,  ,    CIWA:    COWS:     Musculoskeletal: Strength & Muscle Tone: within normal limits Gait & Station: normal Patient leans: N/A  Psychiatric Specialty Exam: ROS  Blood pressure 129/84, pulse 70, temperature 99.1 F (37.3 C), temperature source Oral, weight 215 lb 6.4 oz (97.7 kg).Body mass index is 34.77 kg/m.  General Appearance: Casual  Eye Contact::  Good  Speech:  Normal Rate  Volume:  Normal  Mood:  Anxious  Affect:  Congruent  Thought Process:  Coherent  Orientation:  Full (Time, Place, and Person)  Thought Content:  WDL  Suicidal Thoughts:  No  Homicidal Thoughts:  No  Memory:  Immediate;   Good Recent;   Good Remote;   Good  Judgement:  Fair  Insight:  Good  Psychomotor Activity:  Normal  Concentration:  Fair  Recall:  [place] of Knowledge:Fair  Language: Fair  Akathisia:  No  Handed:  Right  AIMS (if indicated):     Assets:  Communication Skills Desire for Improvement Financial Resources/Insurance Housing Intimacy Leisure Time [place] Talents/Skills Transportation  ADL's:  Intact  Cognition: WNL  Sleep:      Treatment Plan Summary:  Discussed with patient at length about the medications treatment risks benefits and alternatives  Mood swings She will decrease  Seroquel 200 mg on a daily basis. She will also continue on lithium 300 mg qhs  Will continue on gabapentin 300 mg by mouth TID  a day-  I will add trazodone 100 mg by mouth daily at bedtime when necessary for insomnia. Continue BuSpar as prescribed  Continue Topamax 100 mg daily.    She will follow-up in 2  months.    More than 50% of the time spent in psychoeducation, counseling and coordination of care.    This note was generated in part or whole with voice recognition software. Voice regonition is usually quite accurate but there are transcription errors that can and very often do occur. I apologize for any typographical errors that were not detected and corrected.     Rainey Pines, MD  05/18/2017, 10:22 AM  Medication Refill

## 2017-05-19 ENCOUNTER — Telehealth: Payer: Self-pay

## 2017-05-19 NOTE — Telephone Encounter (Signed)
pt had question about the effexor. pt states that the pharamcy did not recevie the effexor you was going to start her on. pt would like for you to [facility] day [facility] and then [facility] day [facility].   Pt was seen on  05-18-17

## 2017-05-24 ENCOUNTER — Ambulatory Visit (INDEPENDENT_AMBULATORY_CARE_PROVIDER_SITE_OTHER): Payer: Medicare Other | Admitting: Psychiatry

## 2017-05-24 ENCOUNTER — Encounter: Payer: Self-pay | Admitting: Psychiatry

## 2017-05-24 VITALS — BP 135/84 | HR 82 | Temp 98.3°F | Wt 213.4 lb

## 2017-05-24 DIAGNOSIS — F3130 Bipolar disorder, current episode depressed, mild or moderate severity, unspecified: Secondary | ICD-10-CM | POA: Diagnosis not present

## 2017-05-24 MED ORDER — VENLAFAXINE HCL ER 75 MG PO CP24: 75.0000 mg | ORAL_CAPSULE | Freq: Every day | ORAL | 1 refills | Status: DC

## 2017-05-24 NOTE — Telephone Encounter (Signed)
Please advise pt that her Seroquel was just changed last week to 200mg . Will not start Effexor unless she has documentation/med bottle from Dr Vallarie Mare and will wait at least 2-3 week before starting medication.

## 2017-05-24 NOTE — Progress Notes (Signed)
[facility] MD Progress Note  05/24/2017 1:09 PM Elizabeth Malone  MRN:  [id number]   Subjective:    Patient is a 66 year-old married female with history of bipolar disorder who presented for follow-up appointment. Patient was hyper and anxious during the interview. She reported that she wants to be started on Effexor as we have discussed during her previous appointment. She remains fixated on the same. 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 She brought the bottle with her. She stated that she does not want to take the mirtazapine again. She has not decreased the dose of Seroquel as she is is still waiting on the trazodone prescription to arrive in the mail. She stated that she is interested in starting the Effexor. Patient was anxious and hyper and stated that she will start the Effexor now.   She stated that she was feeling very depressed earlier this morning although she does not appear depressed and was very hyper during the interview.    She has been trying to lose weight.  She denied having any perceptual disturbances. She denied having any suicidal homicidal ideations or plans.      Principal Problem:  Diagnosis:   Patient Active Problem List   Diagnosis Date Noted  . Advanced care planning/counseling discussion [Z71.89] 02/02/2017  . Bipolar disorder ([place]) [F31.9] 08/05/2015  . Essential hypertension [I10] 08/05/2015  . Hypercholesteremia [E78.00] 08/05/2015  . CKD (chronic kidney disease) stage 3, GFR 30-59 ml/min [N18.3]     Past Medical History:  Past Medical History:  Diagnosis Date  . Anxiety   . Bipolar disorder ([place])   . CKD (chronic kidney disease) stage 3, GFR 30-59 ml/min   . Complication of anesthesia   . Depression   . History of diverticulosis   . Overactive bladder   . PONV (postoperative nausea and vomiting)   . Substance abuse    alcohol    Past Surgical History:   Procedure Laterality Date  . FOOT SURGERY Bilateral   . KNEE SURGERY Left   . TONSILLECTOMY AND ADENOIDECTOMY     Family History:  Family History  Problem Relation Age of Onset  . Alzheimer's disease Mother   . Bipolar disorder Mother   . Depression Mother   . Heart attack Father   . Diabetes Brother   . Obesity Brother   . Depression Maternal Grandfather   . Depression Daughter   . Breast cancer Neg Hx    Family Psychiatric  History: Family history positive for multiple people with depression and a few people who have committed suicide. Social History:  History  Alcohol Use  . 1.8 oz/week  . 3 Shots of liquor per week     History  Drug Use No    Social History   Social History  . Marital status: Married    Spouse name: N/A  . Number of children: N/A  . Years of education: N/A   Social History Main Topics  . Smoking status: Former Smoker    Types: Cigarettes    Start date: 07/09/1967    Quit date: 07/08/1985  . Smokeless tobacco: Never Used  . Alcohol use 1.8 oz/week    3 Shots of liquor per week  . Drug use: No  . Sexual activity: Not Currently   Other Topics Concern  . Not on file   Social History Narrative  . No narrative on  file   Additional Social History:                         Sleep: Fair  Appetite:  Fair  Current Medications: Current Outpatient Prescriptions  Medication Sig Dispense Refill  . atorvastatin (LIPITOR) 20 MG tablet Take 1 tablet (20 mg total) by mouth daily. 90 tablet 4  . benazepril (LOTENSIN) 10 MG tablet Take 1 tablet (10 mg total) by mouth daily. 90 tablet 4  . busPIRone (BUSPAR) 15 MG tablet Take 1 tablet (15 mg total) by mouth 3 (three) times daily. 270 tablet 1  . Calcium Citrate-Vitamin D (CALCIUM + D PO) Take 1 tablet by mouth 2 (two) times daily.     . cyclobenzaprine (FLEXERIL) 10 MG tablet Take 1 tablet (10 mg total) by mouth 3 (three) times daily as needed for muscle spasms. 45 tablet 0  . gabapentin  (NEURONTIN) 300 MG capsule Take 1 capsule (300 mg total) by mouth 3 (three) times daily. 180 capsule 2  . lithium 300 MG tablet Take 1 tablet (300 mg total) by mouth at bedtime. 90 tablet 1  . metoprolol succinate (TOPROL-XL) 25 MG 24 hr tablet Take 1 tablet (25 mg total) by mouth daily. 90 tablet 4  . Omega-3 Fatty Acids (FISH OIL) 1000 MG CAPS Take 1,000 mg by mouth 2 (two) times daily.     . QUEtiapine (SEROQUEL) 200 MG tablet Take 1 tablet (200 mg total) by mouth at bedtime. 90 tablet 1  . scopolamine (TRANSDERM-SCOP, 1.5 MG,) 1 MG/3DAYS Place 1 patch (1.5 mg total) onto the skin every 3 (three) days. 3 patch 1  . topiramate (TOPAMAX) 100 MG tablet Take 1 tablet (100 mg total) by mouth daily. 90 tablet 1  . traZODone (DESYREL) 100 MG tablet Take 1 tablet (100 mg total) by mouth at bedtime. 90 tablet 1   No current facility-administered medications for this visit.     Lab Results: No results found for this or any previous visit (from the past 48 hour(s)).  Physical Findings: AIMS:  , ,  ,  ,    CIWA:    COWS:     Musculoskeletal: Strength & Muscle Tone: within normal limits Gait & Station: normal Patient leans: N/A  Psychiatric Specialty Exam: ROS  There were no vitals taken for this visit.There is no height or weight on file to calculate BMI.  General Appearance: Casual  Eye Contact::  Good  Speech:  Normal Rate  Volume:  Normal  Mood:  Anxious  Affect:  Congruent  Thought Process:  Irrelevant  Orientation:  Full (Time, Place, and Person)  Thought Content:  WDL  Suicidal Thoughts:  No  Homicidal Thoughts:  No  Memory:  Immediate;   Good Recent;   Good Remote;   Good  Judgement:  Fair  Insight:  Good  Psychomotor Activity:  Normal  Concentration:  Fair  Recall:  [employer] of Knowledge:Fair  Language: Fair  Akathisia:  No  Handed:  Right  AIMS (if indicated):     Assets:  Communication Skills Desire for Improvement Financial  Resources/Insurance Housing Intimacy Leisure Time [place] Talents/Skills Transportation  ADL's:  Intact  Cognition: WNL  Sleep:      Treatment Plan Summary:  Discussed with patient at length about the medications treatment risks benefits and alternatives  Mood swings She will continue Seroquel 400 mg on a daily basis.She will decrease the dose of the Seroquel  to 200 mg once she will receive her trazodone prescription She will also continue on lithium 300 mg qhs  Will continue on gabapentin 300 mg by mouth TID  a day-  Buspar 15 mg TID  I will start her on Effexor XR 75 mg daily at this time. I will add trazodone 100 mg by mouth daily at bedtime when necessary for insomnia.  Continue Topamax 100 mg daily.    She will follow-up in 2  months. She does not want to come back in an earlier appointment although I wanted her to come back in one month to review her medications   More than 50% of the time spent in psychoeducation, counseling and coordination of care.    This note was generated in part or whole with voice recognition software. Voice regonition is usually quite accurate but there are transcription errors that can and very often do occur. I apologize for any typographical errors that were not detected and corrected.     Rainey Pines, MD  05/24/2017, 1:09 PM  Medication Refill

## 2017-05-24 NOTE — Telephone Encounter (Signed)
pt call again wanted to make sure that dr. Gretel Acre gets message about not taking the seroquel and wanted to take effexor because of the weight gain. pt also wanted to ask if she should delay her appt until the effexor  is called in and in her system?

## 2017-05-25 ENCOUNTER — Other Ambulatory Visit: Payer: Self-pay | Admitting: Family Medicine

## 2017-05-25 DIAGNOSIS — I10 Essential (primary) hypertension: Secondary | ICD-10-CM

## 2017-05-25 NOTE — Telephone Encounter (Signed)
pharmacy called they need to speak with dr. Gretel Acre in regards to changing the lithium. doctor is to call [phone].  give ref # [id number]

## 2017-05-25 NOTE — Telephone Encounter (Signed)
Patient has completely ran out of medication and loss track of medication. Patient would like to request a short supply of the benazepril until she [facility]  Patient would like to be contacted in regards to what provider would like to do.  Please Advise.  Thank you

## 2017-05-26 ENCOUNTER — Other Ambulatory Visit: Payer: Self-pay | Admitting: Family Medicine

## 2017-05-26 DIAGNOSIS — I10 Essential (primary) hypertension: Secondary | ICD-10-CM

## 2017-05-26 MED ORDER — BENAZEPRIL HCL 10 MG PO TABS: 10.0000 mg | ORAL_TABLET | Freq: Every day | ORAL | 4 refills | Status: DC

## 2017-05-26 NOTE — Telephone Encounter (Signed)
Will defer to dr. Gretel Acre.

## 2017-05-26 NOTE — Telephone Encounter (Signed)
Please advise 

## 2017-06-02 ENCOUNTER — Ambulatory Visit
Admission: EM | Admit: 2017-06-02 | Discharge: 2017-06-02 | Discharge status: Absent without leave | Payer: Medicare Other

## 2017-06-02 ENCOUNTER — Telehealth: Payer: Self-pay | Admitting: Family Medicine

## 2017-06-02 MED ORDER — NITROFURANTOIN MONOHYD MACRO 100 MG PO CAPS: 100.0000 mg | ORAL_CAPSULE | Freq: Two times a day (BID) | ORAL | 0 refills | Status: DC

## 2017-06-02 NOTE — Telephone Encounter (Signed)
Routing to the 2 providers ava liable for review.

## 2017-06-02 NOTE — Telephone Encounter (Signed)
Patient notified, rx called into [facility].

## 2017-06-30 ENCOUNTER — Ambulatory Visit (INDEPENDENT_AMBULATORY_CARE_PROVIDER_SITE_OTHER): Payer: Medicare Other | Admitting: Family Medicine

## 2017-06-30 ENCOUNTER — Encounter: Payer: Self-pay | Admitting: Family Medicine

## 2017-06-30 VITALS — BP 126/77 | HR 76 | Temp 98.0°F | Wt 204.0 lb

## 2017-06-30 DIAGNOSIS — N390 Urinary tract infection, site not specified: Secondary | ICD-10-CM | POA: Diagnosis not present

## 2017-06-30 MED ORDER — SULFAMETHOXAZOLE-TRIMETHOPRIM 800-160 MG PO TABS: 1.0000 | ORAL_TABLET | Freq: Two times a day (BID) | ORAL | 0 refills | Status: DC

## 2017-06-30 NOTE — Progress Notes (Signed)
BP 126/77   Pulse 76   Temp 98 F (36.7 C)   Wt 204 lb (92.5 kg)   SpO2 98%   BMI 32.93 kg/m    Subjective:    Patient ID: Elizabeth Malone, female    DOB: 08-29-1951, 66 y.o.   MRN: [id number]  HPI: Elizabeth Malone is a 66 y.o. female  Chief Complaint  Patient presents with  . Urinary Tract Infection    has been off and on for about a month. Was given Macrobid and that helped some but still been having bladder leaking. Some feeling of not being able to empty her bladder.  No Dysuria.    Pt presents today for intermittent urinary sxs for about a month. Completed a course of macrobid which helped, but sxs returning. Feels unable to fully void and having some incontinence issues which she states are new for her. Denies hematuria, fevers, back pain, N/V. Not taking anything OTC for sxs.   Relevant past medical, surgical, family and social history reviewed and updated as indicated. Interim medical history since our last visit reviewed. Allergies and medications reviewed and updated.  Review of Systems  Constitutional: Negative.   Respiratory: Negative.   Cardiovascular: Negative.   Gastrointestinal: Negative.   Genitourinary:       Inability to fully void Mild leaking incontinence  Musculoskeletal: Negative.   Neurological: Negative.   Psychiatric/Behavioral: Negative.    Per HPI unless specifically indicated above     Objective:    BP 126/77   Pulse 76   Temp 98 F (36.7 C)   Wt 204 lb (92.5 kg)   SpO2 98%   BMI 32.93 kg/m   Wt Readings from Last 3 Encounters:  06/30/17 204 lb (92.5 kg)  02/02/17 211 lb 6.4 oz (95.9 kg)  12/01/16 209 lb (94.8 kg)    Physical Exam  Constitutional: She is oriented to person, place, and time. She appears well-developed and well-nourished. No distress.  HENT:  Head: Atraumatic.  Eyes: Conjunctivae are normal. No scleral icterus.  Neck: Normal range of motion. Neck supple.  Cardiovascular: Normal rate and normal heart sounds.     Pulmonary/Chest: Effort normal. No respiratory distress.  Musculoskeletal: Normal range of motion. She exhibits no tenderness (No CVA tenderness).  Neurological: She is alert and oriented to person, place, and time. No cranial nerve deficit.  Skin: Skin is warm and dry.  Psychiatric: She has a normal mood and affect. Her behavior is normal.  Nursing note and vitals reviewed.   Results for orders placed or performed in visit on 06/30/17  Microscopic Examination  Result Value Ref Range   WBC, UA 0-5 0 - 5 /hpf   RBC, UA None seen 0 - 2 /hpf   Epithelial Cells (non renal) 0-10 0 - 10 /hpf   Bacteria, UA None seen None seen/Few  Urine Culture, Reflex  Result Value Ref Range   Urine Culture, Routine Final report    Organism ID, Bacteria No growth   UA/M w/rflx Culture, Routine (STAT)  Result Value Ref Range   Specific Gravity, UA 1.015 1.005 - 1.030   pH, UA 7.0 5.0 - 7.5   Color, UA Yellow Yellow   Appearance Ur Hazy (A) Clear   Leukocytes, UA 1+ (A) Negative   Protein, UA Negative Negative/Trace   Glucose, UA Negative Negative   Ketones, UA Negative Negative   RBC, UA Negative Negative   Bilirubin, UA Negative Negative   Urobilinogen, Ur 0.2 0.2 -  1.0 mg/dL   Nitrite, UA Negative Negative   Microscopic Examination See below:    Urinalysis Reflex Comment       Assessment & Plan:   Problem List Items Addressed This Visit    None    Visit Diagnoses    Acute lower UTI    -  Primary   Urine largely benign other than 1+ leuks, but given pt persistent sxs will treat with bactrim course. Discussed possible need for referral if no improvement   Relevant Medications   sulfamethoxazole-trimethoprim (BACTRIM DS,SEPTRA DS) 800-160 MG tablet   Other Relevant Orders   UA/M w/rflx Culture, Routine (STAT) (Completed)    Push fluids, probiotics, exercise pelvic floor muscles.    Follow up plan: Return for as scheduled.

## 2017-07-02 LAB — UA/M W/RFLX CULTURE, ROUTINE
Bilirubin, UA: NEGATIVE
Glucose, UA: NEGATIVE
Ketones, UA: NEGATIVE
NITRITE UA: NEGATIVE
PH UA: 7 (ref 5.0–7.5)
Protein, UA: NEGATIVE
RBC UA: NEGATIVE
Specific Gravity, UA: 1.015 (ref 1.005–1.030)
UUROB: 0.2 mg/dL (ref 0.2–1.0)

## 2017-07-02 LAB — MICROSCOPIC EXAMINATION
BACTERIA UA: NONE SEEN
RBC, UA: NONE SEEN /hpf (ref 0–?)

## 2017-07-02 LAB — URINE CULTURE, REFLEX: Organism ID, Bacteria: NO GROWTH

## 2017-07-02 NOTE — Patient Instructions (Signed)
Follow up as needed

## 2017-07-15 ENCOUNTER — Other Ambulatory Visit: Payer: Self-pay | Admitting: Psychiatry

## 2017-07-19 ENCOUNTER — Ambulatory Visit (INDEPENDENT_AMBULATORY_CARE_PROVIDER_SITE_OTHER): Payer: Medicare Other | Admitting: Psychiatry

## 2017-07-19 ENCOUNTER — Other Ambulatory Visit: Payer: Self-pay

## 2017-07-19 ENCOUNTER — Encounter: Payer: Self-pay | Admitting: Psychiatry

## 2017-07-19 VITALS — BP 131/71 | HR 70 | Temp 98.6°F | Wt 202.0 lb

## 2017-07-19 DIAGNOSIS — F070 Personality change due to known physiological condition: Secondary | ICD-10-CM

## 2017-07-19 DIAGNOSIS — F3130 Bipolar disorder, current episode depressed, mild or moderate severity, unspecified: Secondary | ICD-10-CM

## 2017-07-19 MED ORDER — QUETIAPINE FUMARATE 100 MG PO TABS: 100.0000 mg | ORAL_TABLET | Freq: Every day | ORAL | 1 refills | Status: DC

## 2017-07-19 MED ORDER — BUSPIRONE HCL 15 MG PO TABS: 15.0000 mg | ORAL_TABLET | Freq: Three times a day (TID) | ORAL | 1 refills | Status: DC

## 2017-07-19 MED ORDER — GABAPENTIN 300 MG PO CAPS: 300.0000 mg | ORAL_CAPSULE | Freq: Three times a day (TID) | ORAL | 2 refills | Status: DC

## 2017-07-19 MED ORDER — VENLAFAXINE HCL ER 150 MG PO CP24: 150.0000 mg | ORAL_CAPSULE | Freq: Every day | ORAL | 1 refills | Status: DC

## 2017-07-19 MED ORDER — TOPIRAMATE 100 MG PO TABS: 100.0000 mg | ORAL_TABLET | Freq: Every day | ORAL | 1 refills | Status: DC

## 2017-07-19 MED ORDER — TRAZODONE HCL 100 MG PO TABS: 100.0000 mg | ORAL_TABLET | Freq: Every day | ORAL | 1 refills | Status: DC

## 2017-07-19 MED ORDER — LITHIUM CARBONATE 300 MG PO TABS: 300.0000 mg | ORAL_TABLET | Freq: Every day | ORAL | 1 refills | Status: DC

## 2017-07-19 NOTE — Progress Notes (Signed)
[facility] MD Progress Note  07/19/2017 1:31 PM Elizabeth Malone  MRN:  [id number]   Subjective:    Patient is a 66 year-old married female with history of bipolar disorder who presented for follow-up appointment. Patient was very anxious during the interview. She reported that her daughter has moved to [place] with her husband and her family. She reported that her mother is also sick in [place] as she fell and her brother has told her that she might be going to die soon. She went with her daughter to see her in [place]. Patient reported that she feels that her medications are not enough. We discussed about her medications. She reported that she has been trying to lose weight and has lost some weight since her dose of Seroquel was decreased. She is excited about the same. She was sad and tearful during the interview. She has poor relationship with her husband at this time. She denied having any suicidal homicidal ideations or plans. She reported that she does not want to take any medications which will cause her to gain weight as she is working hard to losing weight. Supportive psychotherapy done as patient has not seen her therapist in at least 3 weeks. She will call her therapist again to make appointment. She denied having any problems with sleep at this time.   Principal Problem:  Diagnosis:   Patient Active Problem List   Diagnosis Date Noted  . Advanced care planning/counseling discussion [Z71.89] 02/02/2017  . Bipolar disorder ([place]) [F31.9] 08/05/2015  . Essential hypertension [I10] 08/05/2015  . Hypercholesteremia [E78.00] 08/05/2015  . CKD (chronic kidney disease) stage 3, GFR 30-59 ml/min (HCC) [N18.3]     Past Medical History:  Past Medical History:  Diagnosis Date  . Anxiety   . Bipolar disorder ([place])   . CKD (chronic kidney disease) stage 3, GFR 30-59 ml/min (HCC)   . Complication of anesthesia   . Depression   . History of diverticulosis   . Overactive bladder   . PONV  (postoperative nausea and vomiting)   . Substance abuse ([place])    alcohol    Past Surgical History:  Procedure Laterality Date  . FOOT SURGERY Bilateral   . KNEE SURGERY Left   . TONSILLECTOMY AND ADENOIDECTOMY     Family History:  Family History  Problem Relation Age of Onset  . Alzheimer's disease Mother   . Bipolar disorder Mother   . Depression Mother   . Heart attack Father   . Diabetes Brother   . Obesity Brother   . Depression Maternal Grandfather   . Depression Daughter   . Breast cancer Neg Hx    Family Psychiatric  History: Family history positive for multiple people with depression and a few people who have committed suicide. Social History:  Social History   Substance and Sexual Activity  Alcohol Use Yes  . Alcohol/week: 1.8 oz  . Types: 3 Shots of liquor per week     Social History   Substance and Sexual Activity  Drug Use No    Social History   Socioeconomic History  . Marital status: Married    Spouse name: None  . Number of children: 2  . Years of education: None  . Highest education level: Some college, no degree  Social Needs  . Financial resource strain: Not hard at all  . Food insecurity - worry: Never true  . Food insecurity - inability: Never true  . Transportation needs - medical: No  . Transportation needs -  non-medical: No  Occupational History  . Occupation: not employed  Tobacco Use  . Smoking status: Former Smoker    Types: Cigarettes    Start date: 07/09/1967    Last attempt to quit: 07/08/1985    Years since quitting: 32.0  . Smokeless tobacco: Never Used  Substance and Sexual Activity  . Alcohol use: Yes    Alcohol/week: 1.8 oz    Types: 3 Shots of liquor per week  . Drug use: No  . Sexual activity: Not Currently  Other Topics Concern  . None  Social History Narrative  . None   Additional Social History:                        Sleep: Fair  Appetite:  Fair  Current Medications: Current Outpatient  Medications  Medication Sig Dispense Refill  . atorvastatin (LIPITOR) 20 MG tablet Take 1 tablet (20 mg total) by mouth daily. 90 tablet 4  . benazepril (LOTENSIN) 10 MG tablet Take 1 tablet (10 mg total) by mouth daily. 30 tablet 4  . busPIRone (BUSPAR) 15 MG tablet Take 1 tablet (15 mg total) by mouth 3 (three) times daily. 270 tablet 1  . Calcium Citrate-Vitamin D (CALCIUM + D PO) Take 1 tablet by mouth 2 (two) times daily.     Marland Kitchen gabapentin (NEURONTIN) 300 MG capsule Take 1 capsule (300 mg total) by mouth 3 (three) times daily. 180 capsule 2  . lithium 300 MG tablet Take 1 tablet (300 mg total) by mouth at bedtime. 90 tablet 1  . metoprolol succinate (TOPROL-XL) 25 MG 24 hr tablet Take 1 tablet (25 mg total) by mouth daily. 90 tablet 4  . Omega-3 Fatty Acids (FISH OIL) 1000 MG CAPS Take 1,000 mg by mouth 2 (two) times daily.     Marland Kitchen topiramate (TOPAMAX) 100 MG tablet Take 1 tablet (100 mg total) by mouth daily. 90 tablet 1  . traZODone (DESYREL) 100 MG tablet Take 1 tablet (100 mg total) by mouth at bedtime. 90 tablet 1  . venlafaxine XR (EFFEXOR XR) 75 MG 24 hr capsule Take 1 capsule (75 mg total) by mouth daily with breakfast. 30 capsule 1  . QUEtiapine (SEROQUEL) 200 MG tablet Take 1 tablet (200 mg total) by mouth at bedtime. 90 tablet 1   No current facility-administered medications for this visit.     Lab Results: No results found for this or any previous visit (from the past 48 hour(s)).  Physical Findings: AIMS:  , ,  ,  ,    CIWA:    COWS:     Musculoskeletal: Strength & Muscle Tone: within normal limits Gait & Station: normal Patient leans: N/A  Psychiatric Specialty Exam: ROS  Blood pressure 131/71, pulse 70, temperature 98.6 F (37 C), temperature source Oral, weight 202 lb (91.6 kg).Body mass index is 32.6 kg/m.  General Appearance: Casual  Eye Contact::  Good  Speech:  Normal Rate  Volume:  Decreased  Mood:  Anxious  Affect:  Congruent, Depressed and Labile   Thought Process:  Irrelevant  Orientation:  Full (Time, Place, and Person)  Thought Content:  WDL  Suicidal Thoughts:  No  Homicidal Thoughts:  No  Memory:  Immediate;   Good Recent;   Good Remote;   Good  Judgement:  Fair  Insight:  Good  Psychomotor Activity:  Normal  Concentration:  Fair  Recall:  [place]  Language: Fair  Akathisia:  No  Handed:  Right  AIMS (if indicated):     Assets:  Communication Skills Desire for Improvement Financial Resources/Insurance Housing Intimacy Leisure Time Physical Health Resilience Social Support Talents/Skills Transportation  ADL's:  Intact  Cognition: WNL  Sleep:      Treatment Plan Summary:  Discussed with patient at length about the medications treatment risks benefits and alternatives  Mood swings  She will continue Seroquel 100mg  qhs   She will also continue on lithium 300 mg qhs  Will continue on gabapentin 300 mg by mouth TID  a day-  Buspar 15 mg TID  I will start her on Effexor XR 150  mg daily at this time. Trazodone 100 mg by mouth daily at bedtime when necessary for insomnia. Continue Topamax 100 mg daily.    She will follow-up in 1 months.    More than 50% of the time spent in psychoeducation, counseling and coordination of care.    This note was generated in part or whole with voice recognition software. Voice regonition is usually quite accurate but there are transcription errors that can and very often do occur. I apologize for any typographical errors that were not detected and corrected.     Rainey Pines, MD  07/19/2017, 1:31 PM  Medication Refill

## 2017-07-23 ENCOUNTER — Other Ambulatory Visit: Payer: Self-pay | Admitting: Psychiatry

## 2017-07-26 ENCOUNTER — Other Ambulatory Visit: Payer: Self-pay

## 2017-07-26 MED ORDER — QUETIAPINE FUMARATE 100 MG PO TABS: 200.0000 mg | ORAL_TABLET | Freq: Every day | ORAL | 1 refills | Status: DC

## 2017-07-26 MED ORDER — QUETIAPINE FUMARATE 100 MG PO TABS: 100.0000 mg | ORAL_TABLET | Freq: Every day | ORAL | 1 refills | Status: DC

## 2017-08-10 ENCOUNTER — Ambulatory Visit: Payer: Self-pay | Admitting: Family Medicine

## 2017-08-16 ENCOUNTER — Ambulatory Visit: Payer: Medicare Other | Admitting: Psychiatry

## 2017-08-17 ENCOUNTER — Ambulatory Visit: Payer: Medicare Other | Admitting: Family Medicine

## 2017-08-17 ENCOUNTER — Encounter: Payer: Self-pay | Admitting: Family Medicine

## 2017-08-17 VITALS — BP 102/66 | HR 73 | Temp 98.0°F | Wt 190.0 lb

## 2017-08-17 DIAGNOSIS — I10 Essential (primary) hypertension: Secondary | ICD-10-CM | POA: Diagnosis not present

## 2017-08-17 DIAGNOSIS — E7800 Pure hypercholesterolemia, unspecified: Secondary | ICD-10-CM

## 2017-08-17 DIAGNOSIS — N183 Chronic kidney disease, stage 3 unspecified: Secondary | ICD-10-CM

## 2017-08-17 DIAGNOSIS — J019 Acute sinusitis, unspecified: Secondary | ICD-10-CM | POA: Diagnosis not present

## 2017-08-17 DIAGNOSIS — N3281 Overactive bladder: Secondary | ICD-10-CM | POA: Insufficient documentation

## 2017-08-17 DIAGNOSIS — Z1211 Encounter for screening for malignant neoplasm of colon: Secondary | ICD-10-CM

## 2017-08-17 LAB — LP+ALT+AST PICCOLO, WAIVED
ALT (SGPT) Piccolo, Waived: 35 U/L (ref 10–47)
AST (SGOT) PICCOLO, WAIVED: 34 U/L (ref 11–38)
CHOL/HDL RATIO PICCOLO,WAIVE: 3.3 mg/dL
Cholesterol Piccolo, Waived: 167 mg/dL (ref <200)
HDL Chol Piccolo, Waived: 51 mg/dL — ABNORMAL LOW (ref >59)
LDL Chol Calc Piccolo Waived: 77 mg/dL (ref <100)
TRIGLYCERIDES PICCOLO,WAIVED: 195 mg/dL — AB (ref <150)
VLDL CHOL CALC PICCOLO,WAIVE: 39 mg/dL — AB (ref <30)

## 2017-08-17 LAB — BAYER DCA HB A1C WAIVED: HB A1C: 4.9 % (ref <7.0)

## 2017-08-17 MED ORDER — MIRABEGRON ER 25 MG PO TB24: 25.0000 mg | ORAL_TABLET | Freq: Every day | ORAL | 6 refills | Status: DC

## 2017-08-17 MED ORDER — SULFAMETHOXAZOLE-TRIMETHOPRIM 800-160 MG PO TABS: 1.0000 | ORAL_TABLET | Freq: Two times a day (BID) | ORAL | 0 refills | Status: DC

## 2017-08-17 NOTE — Assessment & Plan Note (Signed)
The current medical regimen is effective;  continue present plan and medications.  

## 2017-08-17 NOTE — Progress Notes (Signed)
BP 102/66   Pulse 73   Temp 98 F (36.7 C) (Oral)   Wt 190 lb (86.2 kg)   SpO2 99%   BMI 30.67 kg/m    Subjective:    Patient ID: Elizabeth Malone, female    DOB: 04-13-1951, 66 y.o.   MRN: [id number]  HPI: Elizabeth Malone is a 66 y.o. female  Chief Complaint  Patient presents with  . Follow-up  . Galactorrhea  . Diarrhea  . Headache   Not galactorrhea  but dysuria Took Detrol in the past but psychiatry recommended not taking that because long-term could contribute to memory loss. Patient has noticed when not stressed doesn't have this problem but under stress has the concern and issues. Wants to try  Something else.for overactive bladder.  Patient has spontaneous diarrhea unknown times and can wake up after soiling the bed. This happens every 3-4 weeks or so.  There is no blood in urine or stool.  Patient also with headaches but working with psychiatry on treating her headaches.  Patient also hasURI symptoms as been ongoing for 2 weeks with marked congestion drainage sniffles spatial pain may be a little better with her voice but still feeling very sick. Relevant past medical, surgical, family and social history reviewed and updated as indicated. Interim medical history since our last visit reviewed. Allergies and medications reviewed and updated.  Review of Systems  Constitutional: Negative.   Respiratory: Negative.   Cardiovascular: Negative.     Per HPI unless specifically indicated above     Objective:    BP 102/66   Pulse 73   Temp 98 F (36.7 C) (Oral)   Wt 190 lb (86.2 kg)   SpO2 99%   BMI 30.67 kg/m   Wt Readings from Last 3 Encounters:  08/17/17 190 lb (86.2 kg)  06/30/17 204 lb (92.5 kg)  02/02/17 211 lb 6.4 oz (95.9 kg)    Physical Exam  Constitutional: She is oriented to person, place, and time. She appears well-developed and well-nourished.  HENT:  Head: Normocephalic and atraumatic.  Eyes: Conjunctivae and EOM are normal.  Neck: Normal range  of motion.  Cardiovascular: Normal rate, regular rhythm and normal heart sounds.  Pulmonary/Chest: Effort normal and breath sounds normal.  Musculoskeletal: Normal range of motion.  Neurological: She is alert and oriented to person, place, and time.  Skin: No erythema.  Psychiatric: She has a normal mood and affect. Her behavior is normal. Judgment and thought content normal.    Results for orders placed or performed in visit on 06/30/17  Microscopic Examination  Result Value Ref Range   WBC, UA 0-5 0 - 5 /hpf   RBC, UA None seen 0 - 2 /hpf   Epithelial Cells (non renal) 0-10 0 - 10 /hpf   Bacteria, UA None seen None seen/Few  Urine Culture, Reflex  Result Value Ref Range   Urine Culture, Routine Final report    Organism ID, Bacteria No growth   UA/M w/rflx Culture, Routine (STAT)  Result Value Ref Range   Specific Gravity, UA 1.015 1.005 - 1.030   pH, UA 7.0 5.0 - 7.5   Color, UA Yellow Yellow   Appearance Ur Hazy (A) Clear   Leukocytes, UA 1+ (A) Negative   Protein, UA Negative Negative/Trace   Glucose, UA Negative Negative   Ketones, UA Negative Negative   RBC, UA Negative Negative   Bilirubin, UA Negative Negative   Urobilinogen, Ur 0.2 0.2 - 1.0 mg/dL  Nitrite, UA Negative Negative   Microscopic Examination See below:    Urinalysis Reflex Comment       Assessment & Plan:   Problem List Items Addressed This Visit      Cardiovascular and Mediastinum   Essential hypertension - Primary    The current medical regimen is effective;  continue present plan and medications.       Relevant Orders   Basic metabolic panel   LP+ALT+AST Piccolo, Waived   Bayer DCA Hb A1c Waived     Genitourinary   CKD (chronic kidney disease) stage 3, GFR 30-59 ml/min (HCC)    The current medical regimen is effective;  continue present plan and medications.       Relevant Orders   Basic metabolic panel   LP+ALT+AST Piccolo, Waived   Bayer DCA Hb A1c Waived   Overactive bladder      We will try myrbetic for bladder        Other   Hypercholesteremia   Relevant Orders   Basic metabolic panel   LP+ALT+AST Piccolo, Waived   Bayer DCA Hb A1c Waived    Other Visit Diagnoses    Encounter for screening colonoscopy       Relevant Orders   Ambulatory referral to Gastroenterology   Acute sinusitis, recurrence not specified, unspecified location       Relevant Medications   sulfamethoxazole-trimethoprim (BACTRIM DS,SEPTRA DS) 800-160 MG tablet     discussed sinusitis care andtreatment use of medications. Use of over-the-counter medications. iscussed diarrhea and loose stools patient has  Appointment coming with GI for colonoscopy.Discussed Imodiumand dietary restriction. Follow up plan: Return in about 3 months (around 11/15/2017) for Med check.

## 2017-08-17 NOTE — Assessment & Plan Note (Signed)
We will try myrbetic for bladder

## 2017-08-18 ENCOUNTER — Encounter: Payer: Self-pay | Admitting: Family Medicine

## 2017-08-18 LAB — BASIC METABOLIC PANEL
BUN/Creatinine Ratio: 13 (ref 12–28)
BUN: 12 mg/dL (ref 8–27)
CALCIUM: 10 mg/dL (ref 8.7–10.3)
CO2: 25 mmol/L (ref 20–29)
Chloride: 103 mmol/L (ref 96–106)
Creatinine, Ser: 0.95 mg/dL (ref 0.57–1.00)
GFR calc non Af Amer: 63 mL/min/{1.73_m2} (ref >59)
GFR, EST AFRICAN AMERICAN: 72 mL/min/{1.73_m2} (ref >59)
Glucose: 97 mg/dL (ref 65–99)
POTASSIUM: 4.7 mmol/L (ref 3.5–5.2)
Sodium: 142 mmol/L (ref 134–144)

## 2017-09-08 ENCOUNTER — Other Ambulatory Visit: Payer: Self-pay | Admitting: Family Medicine

## 2017-09-08 DIAGNOSIS — Z1231 Encounter for screening mammogram for malignant neoplasm of breast: Secondary | ICD-10-CM

## 2017-09-24 ENCOUNTER — Telehealth: Payer: Self-pay | Admitting: Gastroenterology

## 2017-09-24 NOTE — Telephone Encounter (Signed)
Gastroenterology Pre-Procedure Review  Request Date:   Requesting Physician: Dr.    PATIENT REVIEW QUESTIONS: The patient responded to the following health history questions as indicated:    1. Are you having any GI issues? no 2. Do you have a personal history of Polyps? no 3. Do you have a family history of Colon Cancer or Polyps? no 4. Diabetes Mellitus? no 5. Joint replacements in the past 12 months?no 6. Major health problems in the past 3 months?no 7. Any artificial heart valves, MVP, or defibrillator?no    MEDICATIONS & ALLERGIES:    Patient reports the following regarding taking any anticoagulation/antiplatelet therapy:   Plavix, Coumadin, Eliquis, Xarelto, Lovenox, Pradaxa, Brilinta, or Effient? no Aspirin? no  Patient confirms/reports the following medications:  Current Outpatient Medications  Medication Sig Dispense Refill   atorvastatin (LIPITOR) 20 MG tablet Take 1 tablet (20 mg total) by mouth daily. 90 tablet 4   benazepril (LOTENSIN) 10 MG tablet Take 1 tablet (10 mg total) by mouth daily. 30 tablet 4   busPIRone (BUSPAR) 15 MG tablet Take 1 tablet (15 mg total) 3 (three) times daily by mouth. 270 tablet 1   Calcium Citrate-Vitamin D (CALCIUM + D PO) Take 1 tablet by mouth 2 (two) times daily.      gabapentin (NEURONTIN) 300 MG capsule Take 1 capsule (300 mg total) 3 (three) times daily by mouth. 180 capsule 2   lithium 300 MG tablet Take 1 tablet (300 mg total) at bedtime by mouth. 90 tablet 1   metoprolol succinate (TOPROL-XL) 25 MG 24 hr tablet Take 1 tablet (25 mg total) by mouth daily. 90 tablet 4   mirabegron ER (MYRBETRIQ) 25 MG TB24 tablet Take 1 tablet (25 mg total) by mouth daily. 30 tablet 6   Omega-3 Fatty Acids (FISH OIL) 1000 MG CAPS Take 1,000 mg by mouth 2 (two) times daily.      QUEtiapine (SEROQUEL) 100 MG tablet Take 1 tablet (100 mg total) at bedtime by mouth. 90 tablet 1   sulfamethoxazole-trimethoprim (BACTRIM DS,SEPTRA DS) 800-160 MG  tablet Take 1 tablet by mouth 2 (two) times daily. 20 tablet 0   topiramate (TOPAMAX) 100 MG tablet Take 1 tablet (100 mg total) daily by mouth. 90 tablet 1   traZODone (DESYREL) 100 MG tablet Take 1 tablet (100 mg total) at bedtime by mouth. 90 tablet 1   venlafaxine XR (EFFEXOR-XR) 150 MG 24 hr capsule Take 1 capsule (150 mg total) daily with breakfast by mouth. 90 capsule 1   No current facility-administered medications for this visit.     Patient confirms/reports the following allergies:  Allergies  Allergen Reactions   Ace Inhibitors Other (See Comments)   Fluoxetine Other (See Comments)   Hydrocodone-Chlorpheniramine Other (See Comments)   Paroxetine Hcl Other (See Comments)   Penicillin G Benzathine Other (See Comments)   Penicillin V Potassium Other (See Comments)   Clindamycin Hcl Rash and Other (See Comments)    [place] Right' syndrome   Lamotrigine Rash    No orders of the defined types were placed in this encounter.   AUTHORIZATION INFORMATION Primary Insurance: 1D#: Group #:  Secondary Insurance: 1D#: Group #:  SCHEDULE INFORMATION: Date:  Time: Location:

## 2017-09-28 ENCOUNTER — Telehealth: Payer: Self-pay | Admitting: Gastroenterology

## 2017-09-28 NOTE — Telephone Encounter (Signed)
Patient is returning a call to schedule her colonoscopy

## 2017-09-30 ENCOUNTER — Other Ambulatory Visit: Payer: Self-pay

## 2017-09-30 ENCOUNTER — Encounter: Payer: Self-pay | Admitting: Psychiatry

## 2017-09-30 ENCOUNTER — Ambulatory Visit: Payer: Medicare Other | Admitting: Psychiatry

## 2017-09-30 VITALS — BP 112/76 | HR 67 | Temp 97.9°F | Wt 191.8 lb

## 2017-09-30 DIAGNOSIS — F3130 Bipolar disorder, current episode depressed, mild or moderate severity, unspecified: Secondary | ICD-10-CM | POA: Diagnosis not present

## 2017-09-30 DIAGNOSIS — F070 Personality change due to known physiological condition: Secondary | ICD-10-CM | POA: Diagnosis not present

## 2017-09-30 DIAGNOSIS — Z1211 Encounter for screening for malignant neoplasm of colon: Secondary | ICD-10-CM

## 2017-09-30 MED ORDER — VENLAFAXINE HCL ER 150 MG PO CP24: 150.0000 mg | ORAL_CAPSULE | Freq: Every day | ORAL | 1 refills | Status: DC

## 2017-09-30 MED ORDER — TOPIRAMATE 100 MG PO TABS: 150.0000 mg | ORAL_TABLET | Freq: Every day | ORAL | 1 refills | Status: DC

## 2017-09-30 MED ORDER — GABAPENTIN 300 MG PO CAPS: 300.0000 mg | ORAL_CAPSULE | Freq: Three times a day (TID) | ORAL | 2 refills | Status: DC

## 2017-09-30 MED ORDER — LITHIUM CARBONATE 300 MG PO TABS: 300.0000 mg | ORAL_TABLET | Freq: Every day | ORAL | 1 refills | Status: DC

## 2017-09-30 MED ORDER — PEG 3350-KCL-NABCB-NACL-NASULF 236 G PO SOLR: ORAL | 0 refills | Status: DC

## 2017-09-30 MED ORDER — TRAZODONE HCL 100 MG PO TABS: 100.0000 mg | ORAL_TABLET | Freq: Every day | ORAL | 1 refills | Status: DC

## 2017-09-30 MED ORDER — BUSPIRONE HCL 15 MG PO TABS: 15.0000 mg | ORAL_TABLET | Freq: Three times a day (TID) | ORAL | 1 refills | Status: DC

## 2017-09-30 MED ORDER — QUETIAPINE FUMARATE 100 MG PO TABS: 100.0000 mg | ORAL_TABLET | Freq: Every day | ORAL | 1 refills | Status: DC

## 2017-09-30 NOTE — Progress Notes (Signed)
[facility] MD Progress Note  09/30/2017 9:25 AM Elizabeth Malone  MRN:  [id number]   Subjective:    Patient is a 67 year-old married female with history of bipolar disorder who presented for follow-up appointment. Patient noted that she has been doing well and has been compliant with her medications. She reported that she enjoyed her holidays and then to see her daughter in [place]. She reported that she told by herself. She stated that she was feeling happy that she was able to drive by herself and was motivated. She reported that the current combination of medications has been helping her and she has noticed that her symptoms are improving. She is willing to increase the dose of Topamax. She has been losing weight. She denied having any suicidal ideations or plans. We discussed about her medications in detail. She reported that she has noticed that she is not experiencing any adverse reaction as she was having jerking movements related to Seroquel in the past. She is motivated to exercise and to lose weight. She also has good relationship with her husband and it is improving.     Principal Problem:  Diagnosis:   Patient Active Problem List   Diagnosis Date Noted  . Overactive bladder [N32.81] 08/17/2017  . Advanced care planning/counseling discussion [Z71.89] 02/02/2017  . Bipolar disorder ([place]) [F31.9] 08/05/2015  . Essential hypertension [I10] 08/05/2015  . Hypercholesteremia [E78.00] 08/05/2015  . CKD (chronic kidney disease) stage 3, GFR 30-59 ml/min (HCC) [N18.3]     Past Medical History:  Past Medical History:  Diagnosis Date  . Anxiety   . Bipolar disorder ([place])   . CKD (chronic kidney disease) stage 3, GFR 30-59 ml/min (HCC)   . Complication of anesthesia   . Depression   . History of diverticulosis   . Overactive bladder   . PONV (postoperative nausea and vomiting)   . Substance abuse ([place])    alcohol    Past Surgical History:  Procedure Laterality Date  . FOOT SURGERY  Bilateral   . KNEE SURGERY Left   . TONSILLECTOMY AND ADENOIDECTOMY     Family History:  Family History  Problem Relation Age of Onset  . Alzheimer's disease Mother   . Bipolar disorder Mother   . Depression Mother   . Heart attack Father   . Diabetes Brother   . Obesity Brother   . Depression Maternal Grandfather   . Depression Daughter   . Breast cancer Neg Hx    Family Psychiatric  History: Family history positive for multiple people with depression and a few people who have committed suicide. Social History:  Social History   Substance and Sexual Activity  Alcohol Use Yes  . Alcohol/week: 1.8 oz  . Types: 3 Shots of liquor per week     Social History   Substance and Sexual Activity  Drug Use No    Social History   Socioeconomic History  . Marital status: Married    Spouse name: Not on file  . Number of children: 2  . Years of education: Not on file  . Highest education level: Some college, no degree  Social Needs  . Financial resource strain: Not hard at all  . Food insecurity - worry: Never true  . Food insecurity - inability: Never true  . Transportation needs - medical: No  . Transportation needs - non-medical: No  Occupational History  . Occupation: not employed  Tobacco Use  . Smoking status: Former Smoker    Types: Cigarettes  Start date: 07/09/1967    Last attempt to quit: 07/08/1985    Years since quitting: 32.2  . Smokeless tobacco: Never Used  Substance and Sexual Activity  . Alcohol use: Yes    Alcohol/week: 1.8 oz    Types: 3 Shots of liquor per week  . Drug use: No  . Sexual activity: Not Currently  Other Topics Concern  . Not on file  Social History Narrative  . Not on file   Additional Social History:                        Sleep: Fair  Appetite:  Fair  Current Medications: Current Outpatient Medications  Medication Sig Dispense Refill  . atorvastatin (LIPITOR) 20 MG tablet Take 1 tablet (20 mg total) by mouth  daily. 90 tablet 4  . benazepril (LOTENSIN) 10 MG tablet Take 1 tablet (10 mg total) by mouth daily. 30 tablet 4  . busPIRone (BUSPAR) 15 MG tablet Take 1 tablet (15 mg total) 3 (three) times daily by mouth. 270 tablet 1  . Calcium Citrate-Vitamin D (CALCIUM + D PO) Take 1 tablet by mouth 2 (two) times daily.     Marland Kitchen gabapentin (NEURONTIN) 300 MG capsule Take 1 capsule (300 mg total) 3 (three) times daily by mouth. 180 capsule 2  . lithium 300 MG tablet Take 1 tablet (300 mg total) at bedtime by mouth. 90 tablet 1  . metoprolol succinate (TOPROL-XL) 25 MG 24 hr tablet Take 1 tablet (25 mg total) by mouth daily. 90 tablet 4  . mirabegron ER (MYRBETRIQ) 25 MG TB24 tablet Take 1 tablet (25 mg total) by mouth daily. 30 tablet 6  . Omega-3 Fatty Acids (FISH OIL) 1000 MG CAPS Take 1,000 mg by mouth 2 (two) times daily.     . QUEtiapine (SEROQUEL) 100 MG tablet Take 1 tablet (100 mg total) at bedtime by mouth. 90 tablet 1  . sulfamethoxazole-trimethoprim (BACTRIM DS,SEPTRA DS) 800-160 MG tablet Take 1 tablet by mouth 2 (two) times daily. 20 tablet 0  . topiramate (TOPAMAX) 100 MG tablet Take 1 tablet (100 mg total) daily by mouth. 90 tablet 1  . traZODone (DESYREL) 100 MG tablet Take 1 tablet (100 mg total) at bedtime by mouth. 90 tablet 1  . venlafaxine XR (EFFEXOR-XR) 150 MG 24 hr capsule Take 1 capsule (150 mg total) daily with breakfast by mouth. 90 capsule 1   No current facility-administered medications for this visit.     Lab Results: No results found for this or any previous visit (from the past 48 hour(s)).  Physical Findings: AIMS:  , ,  ,  ,    CIWA:    COWS:     Musculoskeletal: Strength & Muscle Tone: within normal limits Gait & Station: normal Patient leans: N/A  Psychiatric Specialty Exam: ROS  There were no vitals taken for this visit.There is no height or weight on file to calculate BMI.  General Appearance: Casual  Eye Contact::  Good  Speech:  Normal Rate  Volume:   Normal  Mood:  Anxious  Affect:  Congruent  Thought Process:  Coherent and Goal Directed  Orientation:  Full (Time, Place, and Person)  Thought Content:  WDL  Suicidal Thoughts:  No  Homicidal Thoughts:  No  Memory:  Immediate;   Good Recent;   Good Remote;   Good  Judgement:  Fair  Insight:  Good  Psychomotor Activity:  Normal  Concentration:  Fair  Recall:  [place]: Fair  Akathisia:  No  Handed:  Right  AIMS (if indicated):     Assets:  Communication Skills Desire for Improvement Financial Resources/Insurance Housing Intimacy Leisure Time Physical Health Resilience Social Support Talents/Skills Transportation  ADL's:  Intact  Cognition: WNL  Sleep:      Treatment Plan Summary:  Discussed with patient at length about the medications treatment risks benefits and alternatives  Mood swings  She will continue Seroquel 100mg  qhs   She will also continue on lithium 300 mg qhs  Will continue on gabapentin 300 mg by mouth TID  a day-  Buspar 15 mg TID   Effexor XR 150  mg daily at this time. Trazodone 100 mg by mouth daily at bedtime when necessary for insomnia. Continue Topamax 150 mg daily.    She will follow-up in 2 months.    More than 50% of the time spent in psychoeducation, counseling and coordination of care.    This note was generated in part or whole with voice recognition software. Voice regonition is usually quite accurate but there are transcription errors that can and very often do occur. I apologize for any typographical errors that were not detected and corrected.     Rainey Pines, MD  09/30/2017, 9:25 AM  Medication Refill

## 2017-09-30 NOTE — Telephone Encounter (Signed)
Patient has been scheduled for her colonoscopy. She stopped by the office to schedule.

## 2017-10-01 ENCOUNTER — Other Ambulatory Visit: Payer: Self-pay

## 2017-10-01 DIAGNOSIS — I10 Essential (primary) hypertension: Secondary | ICD-10-CM

## 2017-10-02 MED ORDER — BENAZEPRIL HCL 10 MG PO TABS: 10.0000 mg | ORAL_TABLET | Freq: Every day | ORAL | 4 refills | Status: DC

## 2017-10-06 NOTE — Discharge Instructions (Signed)
General Anesthesia, Adult, Care After °These instructions provide you with information about caring for yourself after your procedure. Your health care provider may also give you more specific instructions. Your treatment has been planned according to current medical practices, but problems sometimes occur. Call your health care provider if you have any problems or questions after your procedure. °What can I expect after the procedure? °After the procedure, it is common to have: °· Vomiting. °· A sore throat. °· Mental slowness. ° °It is common to feel: °· Nauseous. °· Cold or shivery. °· Sleepy. °· Tired. °· Sore or achy, even in parts of your body where you did not have surgery. ° °Follow these instructions at home: °For at least 24 hours after the procedure: °· Do not: °? Participate in activities where you could fall or become injured. °? Drive. °? Use heavy machinery. °? Drink alcohol. °? Take sleeping pills or medicines that cause drowsiness. °? Make important decisions or sign legal documents. °? Take care of children on your own. °· Rest. °Eating and drinking °· If you vomit, drink water, juice, or soup when you can drink without vomiting. °· Drink enough fluid to keep your urine clear or pale yellow. °· Make sure you have little or no nausea before eating solid foods. °· Follow the diet recommended by your health care provider. °General instructions °· Have a responsible adult stay with you until you are awake and alert. °· Return to your normal activities as told by your health care provider. Ask your health care provider what activities are safe for you. °· Take over-the-counter and prescription medicines only as told by your health care provider. °· If you smoke, do not smoke without supervision. °· Keep all follow-up visits as told by your health care provider. This is important. °Contact a health care provider if: °· You continue to have nausea or vomiting at home, and medicines are not helpful. °· You  cannot drink fluids or start eating again. °· You cannot urinate after 8-12 hours. °· You develop a skin rash. °· You have fever. °· You have increasing redness at the site of your procedure. °Get help right away if: °· You have difficulty breathing. °· You have chest pain. °· You have unexpected bleeding. °· You feel that you are having a life-threatening or urgent problem. °This information is not intended to replace advice given to you by your health care provider. Make sure you discuss any questions you have with your health care provider. °Document Released: 12/07/2000 Document Revised: 02/03/2016 Document Reviewed: 08/15/2015 °Elsevier Interactive Patient Education © 2018 [employer] ° °

## 2017-10-07 ENCOUNTER — Ambulatory Visit: Payer: Medicare Other | Admitting: Anesthesiology

## 2017-10-07 ENCOUNTER — Ambulatory Visit
Admission: RE | Admit: 2017-10-07 | Discharge: 2017-10-07 | Disposition: A | Discharge status: Home / Self Care | Payer: Medicare Other | Source: Ambulatory Visit | Attending: Gastroenterology | Admitting: Gastroenterology

## 2017-10-07 ENCOUNTER — Encounter
Admission: RE | Disposition: A | Discharge status: Home / Self Care | Payer: Self-pay | Source: Ambulatory Visit | Attending: Gastroenterology

## 2017-10-07 DIAGNOSIS — K621 Rectal polyp: Secondary | ICD-10-CM

## 2017-10-07 DIAGNOSIS — K635 Polyp of colon: Secondary | ICD-10-CM | POA: Insufficient documentation

## 2017-10-07 DIAGNOSIS — F419 Anxiety disorder, unspecified: Secondary | ICD-10-CM | POA: Insufficient documentation

## 2017-10-07 DIAGNOSIS — K5730 Diverticulosis of large intestine without perforation or abscess without bleeding: Secondary | ICD-10-CM | POA: Diagnosis not present

## 2017-10-07 DIAGNOSIS — K640 First degree hemorrhoids: Secondary | ICD-10-CM | POA: Insufficient documentation

## 2017-10-07 DIAGNOSIS — I129 Hypertensive chronic kidney disease with stage 1 through stage 4 chronic kidney disease, or unspecified chronic kidney disease: Secondary | ICD-10-CM | POA: Insufficient documentation

## 2017-10-07 DIAGNOSIS — Z880 Allergy status to penicillin: Secondary | ICD-10-CM | POA: Diagnosis not present

## 2017-10-07 DIAGNOSIS — Z79899 Other long term (current) drug therapy: Secondary | ICD-10-CM | POA: Insufficient documentation

## 2017-10-07 DIAGNOSIS — Z881 Allergy status to other antibiotic agents status: Secondary | ICD-10-CM | POA: Diagnosis not present

## 2017-10-07 DIAGNOSIS — F1010 Alcohol abuse, uncomplicated: Secondary | ICD-10-CM | POA: Insufficient documentation

## 2017-10-07 DIAGNOSIS — Z1211 Encounter for screening for malignant neoplasm of colon: Secondary | ICD-10-CM | POA: Diagnosis not present

## 2017-10-07 DIAGNOSIS — Z87891 Personal history of nicotine dependence: Secondary | ICD-10-CM | POA: Diagnosis not present

## 2017-10-07 DIAGNOSIS — E669 Obesity, unspecified: Secondary | ICD-10-CM | POA: Diagnosis not present

## 2017-10-07 DIAGNOSIS — Z888 Allergy status to other drugs, medicaments and biological substances status: Secondary | ICD-10-CM | POA: Insufficient documentation

## 2017-10-07 DIAGNOSIS — Z6830 Body mass index (BMI) 30.0-30.9, adult: Secondary | ICD-10-CM | POA: Insufficient documentation

## 2017-10-07 DIAGNOSIS — D122 Benign neoplasm of ascending colon: Secondary | ICD-10-CM | POA: Diagnosis not present

## 2017-10-07 DIAGNOSIS — N183 Chronic kidney disease, stage 3 (moderate): Secondary | ICD-10-CM | POA: Diagnosis not present

## 2017-10-07 DIAGNOSIS — F329 Major depressive disorder, single episode, unspecified: Secondary | ICD-10-CM | POA: Diagnosis not present

## 2017-10-07 HISTORY — DX: Headache: R51

## 2017-10-07 HISTORY — PX: COLONOSCOPY WITH PROPOFOL: SHX5780

## 2017-10-07 HISTORY — PX: POLYPECTOMY: SHX5525

## 2017-10-07 HISTORY — DX: Headache, unspecified: R51.9

## 2017-10-07 HISTORY — DX: Gastro-esophageal reflux disease without esophagitis: K21.9

## 2017-10-07 SURGERY — COLONOSCOPY WITH PROPOFOL
Anesthesia: General | Wound class: Contaminated

## 2017-10-07 MED ORDER — LACTATED RINGERS IV SOLN
INTRAVENOUS | Status: DC
  Administered 2017-10-07 (×2): via INTRAVENOUS

## 2017-10-07 MED ORDER — PROPOFOL 10 MG/ML IV BOLUS
INTRAVENOUS | Status: DC | PRN
  Administered 2017-10-07: 40 mg via INTRAVENOUS
  Administered 2017-10-07: 60 mg via INTRAVENOUS
  Administered 2017-10-07: 40 mg via INTRAVENOUS
  Administered 2017-10-07: 60 mg via INTRAVENOUS
  Administered 2017-10-07: 50 mg via INTRAVENOUS
  Administered 2017-10-07: 60 mg via INTRAVENOUS
  Administered 2017-10-07: 100 mg via INTRAVENOUS
  Administered 2017-10-07: 40 mg via INTRAVENOUS
  Administered 2017-10-07: 20 mg via INTRAVENOUS
  Administered 2017-10-07: 40 mg via INTRAVENOUS
  Administered 2017-10-07: 50 mg via INTRAVENOUS
  Administered 2017-10-07: 20 mg via INTRAVENOUS

## 2017-10-07 MED ORDER — STERILE WATER FOR IRRIGATION IR SOLN
Status: DC | PRN
  Administered 2017-10-07: 13:00:00

## 2017-10-07 MED ORDER — LIDOCAINE HCL (CARDIAC) 20 MG/ML IV SOLN
INTRAVENOUS | Status: DC | PRN
  Administered 2017-10-07: 30 mg via INTRAVENOUS

## 2017-10-07 MED ORDER — LACTATED RINGERS IV SOLN: 500.0000 mL | INTRAVENOUS | Status: DC

## 2017-10-07 MED ORDER — LACTATED RINGERS IV SOLN: 1000.0000 mL | INTRAVENOUS | Status: DC

## 2017-10-07 SURGICAL SUPPLY — 24 items
CANISTER SUCT 1200ML W/VALVE (MISCELLANEOUS) ×4 IMPLANT
CLIP HMST 235XBRD CATH ROT (MISCELLANEOUS) IMPLANT
CLIP RESOLUTION 360 11X235 (MISCELLANEOUS)
ELECT REM PT RETURN 9FT ADLT (ELECTROSURGICAL)
ELECTRODE REM PT RTRN 9FT ADLT (ELECTROSURGICAL) IMPLANT
FCP ESCP3.2XJMB 240X2.8X (MISCELLANEOUS)
FORCEPS BIOP RAD 4 LRG CAP 4 (CUTTING FORCEPS) ×4 IMPLANT
FORCEPS BIOP RJ4 240 W/NDL (MISCELLANEOUS)
FORCEPS ESCP3.2XJMB 240X2.8X (MISCELLANEOUS) IMPLANT
GOWN CVR UNV OPN BCK APRN NK (MISCELLANEOUS) ×4 IMPLANT
GOWN ISOL THUMB LOOP REG UNIV (MISCELLANEOUS) ×4
INJECTOR VARIJECT VIN23 (MISCELLANEOUS) IMPLANT
KIT DEFENDO VALVE AND CONN (KITS) IMPLANT
KIT ENDO PROCEDURE OLY (KITS) ×4 IMPLANT
MARKER SPOT ENDO TATTOO 5ML (MISCELLANEOUS) IMPLANT
PROBE APC STR FIRE (PROBE) IMPLANT
RETRIEVER NET ROTH 2.5X230 LF (MISCELLANEOUS) IMPLANT
SNARE SHORT THROW 13M SML OVAL (MISCELLANEOUS) ×4 IMPLANT
SNARE SHORT THROW 30M LRG OVAL (MISCELLANEOUS) IMPLANT
SNARE SNG USE RND 15MM (INSTRUMENTS) IMPLANT
SPOT EX ENDOSCOPIC TATTOO (MISCELLANEOUS)
TRAP ETRAP POLY (MISCELLANEOUS) ×4 IMPLANT
VARIJECT INJECTOR VIN23 (MISCELLANEOUS)
WATER STERILE IRR 250ML POUR (IV SOLUTION) ×4 IMPLANT

## 2017-10-07 NOTE — H&P (Signed)
Elizabeth Lame, MD [address] Elizabeth Malone, [place] [address] Phone: [phone] Fax : [phone]  Primary Care Physician:  Guadalupe Maple, MD Primary Gastroenterologist:  Dr. Allen Norris  Pre-Procedure History & Physical: HPI:  Elizabeth Malone is a 67 y.o. female is here for a screening colonoscopy.   Past Medical History:  Diagnosis Date  . Anxiety   . Bipolar disorder ([facility])   . CKD (chronic kidney disease) stage 3, GFR 30-59 ml/min ([facility])   . Complication of anesthesia   . Depression   . GERD (gastroesophageal reflux disease)    occasionally has to use tums  . Headache   . History of diverticulosis   . Overactive bladder   . PONV (postoperative nausea and vomiting)   . Substance abuse ([facility] [facility])    alcohol    Past Surgical History:  Procedure Laterality Date  . FOOT SURGERY Bilateral   . KNEE SURGERY Left   . TONSILLECTOMY AND ADENOIDECTOMY      Prior to Admission medications   Medication Sig Start Date End Date Taking? Authorizing Provider  atorvastatin (LIPITOR) 20 MG tablet Take 1 tablet (20 mg total) by mouth daily. 02/02/17  Yes Crissman, Jeannette How, MD  benazepril (LOTENSIN) 10 MG tablet Take 1 tablet (10 mg total) by mouth daily. 10/02/17  Yes Crissman, Jeannette How, MD  busPIRone (BUSPAR) 15 MG tablet Take 1 tablet (15 mg total) by mouth 3 (three) times daily. 09/30/17  Yes Rainey Pines, MD  Calcium Citrate-Vitamin D (CALCIUM + D PO) Take 1 tablet by mouth 2 (two) times daily.    Yes [provider]  gabapentin (NEURONTIN) 300 MG capsule Take 1 capsule (300 mg total) by mouth 3 (three) times daily. 09/30/17  Yes Rainey Pines, MD  lithium 300 MG tablet Take 1 tablet (300 mg total) by mouth at bedtime. 09/30/17  Yes Rainey Pines, MD  metoprolol succinate (TOPROL-XL) 25 MG 24 hr tablet Take 1 tablet (25 mg total) by mouth daily. 02/02/17  Yes Crissman, Jeannette How, MD  Omega-3 Fatty Acids (FISH OIL) 1000 MG CAPS Take 1,000 mg by mouth 2 (two) times daily.    Yes [provider]  QUEtiapine (SEROQUEL) 100 MG tablet Take 1 tablet (100 mg total) by mouth at bedtime. 09/30/17  Yes Rainey Pines, MD  topiramate (TOPAMAX) 100 MG tablet Take 1.5 tablets (150 mg total) by mouth daily. 09/30/17  Yes Rainey Pines, MD  traZODone (DESYREL) 100 MG tablet Take 1 tablet (100 mg total) by mouth at bedtime. 09/30/17  Yes Rainey Pines, MD  venlafaxine XR (EFFEXOR-XR) 150 MG 24 hr capsule Take 1 capsule (150 mg total) by mouth daily with breakfast. 09/30/17  Yes Rainey Pines, MD  mirabegron ER (MYRBETRIQ) 25 MG TB24 tablet Take 1 tablet (25 mg total) by mouth daily. Patient not taking: Reported on 10/01/2017 08/17/17   Guadalupe Maple, MD  polyethylene glycol (GOLYTELY) 236 g solution Drink one 8 oz glass every 20 mins until stools are clear starting at 5:00pm on 10/06/17 09/30/17   Elizabeth Lame, MD  sulfamethoxazole-trimethoprim (BACTRIM DS,SEPTRA DS) 800-160 MG tablet Take 1 tablet by mouth 2 (two) times daily. Patient not taking: Reported on 10/01/2017 08/17/17   Guadalupe Maple, MD    Allergies as of 09/30/2017 - Review Complete 09/30/2017  Allergen Reaction Noted  . Ace inhibitors Other (See Comments) 07/09/2015  . Fluoxetine Other (See Comments) 07/09/2015  . Hydrocodone-chlorpheniramine Other (See Comments) 07/09/2015  . Paroxetine hcl Other (See Comments)  07/09/2015  . Penicillin g benzathine Other (See Comments) 09/26/2015  . Penicillin v potassium Other (See Comments) 07/09/2015  . Clindamycin hcl Rash and Other (See Comments) 07/09/2015  . Lamotrigine Rash 08/01/2015    Family History  Problem Relation Age of Onset  . Alzheimer's disease Mother   . Bipolar disorder Mother   . Depression Mother   . Heart attack Father   . Diabetes Brother   . Obesity Brother   . Depression Maternal Grandfather   . Depression Daughter   . Breast cancer Neg Hx     Social History   Socioeconomic History  . Marital status: Married    Spouse name: Not on file  . Number of  children: 2  . Years of education: Not on file  . Highest education level: Some college, no degree  Social Needs  . Financial resource strain: Not hard at all  . Food insecurity - worry: Never true  . Food insecurity - inability: Never true  . Transportation needs - medical: No  . Transportation needs - non-medical: No  Occupational History  . Occupation: not employed  Tobacco Use  . Smoking status: Former Smoker    Types: Cigarettes    Start date: 07/09/1967    Last attempt to quit: 07/08/1985    Years since quitting: 32.2  . Smokeless tobacco: Never Used  Substance and Sexual Activity  . Alcohol use: Yes    Alcohol/week: 1.8 oz    Types: 3 Shots of liquor per week  . Drug use: No  . Sexual activity: Not Currently  Other Topics Concern  . Not on file  Social History Narrative  . Not on file    Review of Systems: See HPI, otherwise negative ROS  Physical Exam: BP 122/75   Pulse 70   Temp 97.9 F (36.6 C)   Ht 5\' 6"  (1.676 m)   Wt 188 lb (85.3 kg)   SpO2 100%   BMI 30.34 kg/m  General:   Alert,  pleasant and cooperative in NAD Head:  Normocephalic and atraumatic. Neck:  Supple; no masses or thyromegaly. Lungs:  Clear throughout to auscultation.    Heart:  Regular rate and rhythm. Abdomen:  Soft, nontender and nondistended. Normal bowel sounds, without guarding, and without rebound.   Neurologic:  Alert and  oriented x4;  grossly normal neurologically.  Impression/Plan: Elizabeth Malone is now here to undergo a screening colonoscopy.  Risks, benefits, and alternatives regarding colonoscopy have been reviewed with the patient.  Questions have been answered.  All parties agreeable.

## 2017-10-07 NOTE — Op Note (Signed)
[facility] Gastroenterology Patient Name: Elizabeth Malone Procedure Date: 10/07/2017 11:26 AM MRN: [id number] Account #: [id number] Date of Birth: 1951/07/22 Admit Type: Outpatient Age: 67 Room: [facility] OR ROOM 01 Gender: Female Note Status: Finalized Procedure:            Colonoscopy Indications:          Screening for colorectal malignant neoplasm Providers:            [name] MD, MD Referring MD:         Guadalupe Maple, MD (Referring MD) Medicines:            Propofol per Anesthesia Complications:        No immediate complications. Procedure:            Pre-Anesthesia Assessment:                       - Prior to the procedure, a History and Physical was                        performed, and patient medications and allergies were                        reviewed. The patient's tolerance of previous                        anesthesia was also reviewed. The risks and benefits of                        the procedure and the sedation options and risks were                        discussed with the patient. All questions were                        answered, and informed consent was obtained. Prior                        Anticoagulants: The patient has taken no previous                        anticoagulant or antiplatelet agents. ASA Grade                        Assessment: II - A patient with mild systemic disease.                        After reviewing the risks and benefits, the patient was                        deemed in satisfactory condition to undergo the                        procedure.                       After obtaining informed consent, the colonoscope was                        passed under direct vision. Throughout the procedure,  the patient's blood pressure, pulse, and oxygen                        saturations were monitored continuously. The [place] [id number]) was introduced through the                         anus and advanced to the the cecum, identified by                        appendiceal orifice and ileocecal valve. The                        colonoscopy was performed without difficulty. The                        patient tolerated the procedure well. The quality of                        the bowel preparation was good. Findings:      The perianal and digital rectal examinations were normal.      A 3 mm polyp was found in the ascending colon. The polyp was sessile.       The polyp was removed with a cold biopsy forceps. Resection and       retrieval were complete.      A 6 mm polyp was found in the rectum. The polyp was sessile. The polyp       was removed with a cold snare. Resection and retrieval were complete.      Multiple small-mouthed diverticula were found in the sigmoid colon.      Non-bleeding internal hemorrhoids were found during retroflexion. The       hemorrhoids were Grade I (internal hemorrhoids that do not prolapse). Impression:           - One 3 mm polyp in the ascending colon, removed with a                        cold biopsy forceps. Resected and retrieved.                       - One 6 mm polyp in the rectum, removed with a cold                        snare. Resected and retrieved.                       - Diverticulosis in the sigmoid colon.                       - Non-bleeding internal hemorrhoids. Recommendation:       - Discharge patient to home.                       - Resume previous diet.                       - Continue present medications.                       -  Await pathology results.                       - Repeat colonoscopy in 5 years if polyp adenoma and 10                        years if hyperplastic Procedure Code(s):    --- Professional ---                       [phone], Colonoscopy, flexible; with removal of tumor(s),                        polyp(s), or other lesion(s) by snare technique                       [id number], 45,  Colonoscopy, flexible; with biopsy, single                        or multiple Diagnosis Code(s):    --- Professional ---                       Z12.11, Encounter for screening for malignant neoplasm                        of colon                       D12.2, Benign neoplasm of ascending colon                       K62.1, Rectal polyp CPT copyright 2016 American Medical Association. All rights reserved. The codes documented in this report are preliminary and upon coder review may  be revised to meet current compliance requirements. [name] MD, MD 10/07/2017 1:30:22 PM This report has been signed electronically. Number of Addenda: 0 Note Initiated On: 10/07/2017 11:26 AM Scope Withdrawal Time: 0 hours 11 minutes 54 seconds  Total Procedure Duration: 0 hours 19 minutes 42 seconds       [facility]

## 2017-10-07 NOTE — Transfer of Care (Signed)
Immediate Anesthesia Transfer of Care Note  Patient: Elizabeth Malone  Procedure(s) Performed: COLONOSCOPY WITH PROPOFOL (N/A )  Patient Location: PACU  Anesthesia Type: General  Level of Consciousness: awake, alert  and patient cooperative  Airway and Oxygen Therapy: Patient Spontanous Breathing and Patient connected to supplemental oxygen  Post-op Assessment: Post-op Vital signs reviewed, Patient's Cardiovascular Status Stable, Respiratory Function Stable, Patent Airway and No signs of Nausea or vomiting  Post-op Vital Signs: Reviewed and stable  Complications: No apparent anesthesia complications

## 2017-10-07 NOTE — Anesthesia Postprocedure Evaluation (Signed)
Anesthesia Post Note  Patient: Elizabeth Malone  Procedure(s) Performed: COLONOSCOPY WITH PROPOFOL (N/A ) POLYPECTOMY  Patient location during evaluation: PACU Anesthesia Type: General Level of consciousness: awake Pain management: pain level controlled Vital Signs Assessment: post-procedure vital signs reviewed and stable Respiratory status: spontaneous breathing Cardiovascular status: blood pressure returned to baseline Postop Assessment: no headache Anesthetic complications: no    Lavonna Monarch

## 2017-10-07 NOTE — Anesthesia Preprocedure Evaluation (Addendum)
Anesthesia Evaluation  Patient identified by MRN, date of birth, ID band Patient awake    Reviewed: Allergy & Precautions, NPO status , Patient's Chart, lab work & pertinent test results, reviewed documented beta blocker date and time   History of Anesthesia Complications (+) PONV  Airway Mallampati: II  TM Distance: >3 FB Neck ROM: Full    Dental no notable dental hx.    Pulmonary former smoker,    Pulmonary exam normal breath sounds clear to auscultation       Cardiovascular hypertension, Normal cardiovascular exam Rhythm:Regular Rate:Normal     Neuro/Psych  Headaches, Anxiety Depression Bipolar Disorder    GI/Hepatic Neg liver ROS, GERD  ,  Endo/Other  negative endocrine ROS  Renal/GU CRFRenal disease Bladder dysfunction      Musculoskeletal negative musculoskeletal ROS (+)   Abdominal (+) + obese,   Peds  Hematology negative hematology ROS (+)   Anesthesia Other Findings   Reproductive/Obstetrics                            Anesthesia Physical Anesthesia Plan  ASA: II  Anesthesia Plan: General   Post-op Pain Management:    Induction: Intravenous  PONV Risk Score and Plan:   Airway Management Planned: Natural Airway  Additional Equipment: None  Intra-op Plan:   Post-operative Plan:   Informed Consent: I have reviewed the patients History and Physical, chart, labs and discussed the procedure including the risks, benefits and alternatives for the proposed anesthesia with the patient or authorized representative who has indicated his/her understanding and acceptance.     Plan Discussed with: CRNA, Anesthesiologist and Surgeon  Anesthesia Plan Comments:         Anesthesia Quick Evaluation

## 2017-10-07 NOTE — Anesthesia Procedure Notes (Signed)
Procedure Name: MAC Performed by: Idus Rathke, CRNA Pre-anesthesia Checklist: Emergency Drugs available, Suction available, Patient being monitored and Timeout performed Patient Re-evaluated:Patient Re-evaluated prior to induction Oxygen Delivery Method: Nasal cannula       

## 2017-10-12 ENCOUNTER — Encounter: Payer: Self-pay | Admitting: Gastroenterology

## 2017-10-13 ENCOUNTER — Encounter: Payer: Self-pay | Admitting: Gastroenterology

## 2017-10-13 ENCOUNTER — Telehealth: Payer: Self-pay

## 2017-10-13 MED ORDER — OXYBUTYNIN CHLORIDE ER 10 MG PO TB24: 10.0000 mg | ORAL_TABLET | Freq: Every day | ORAL | 3 refills | Status: DC

## 2017-10-13 NOTE — Addendum Note (Signed)
Addended by: Golden Pop A on: 10/13/2017 02:11 PM   Modules accepted: Orders

## 2017-10-13 NOTE — Telephone Encounter (Signed)
done

## 2017-10-13 NOTE — Telephone Encounter (Signed)
Called patient to schedule medicare wellness visit. After scheduling patient inquired if she could be switched back to oxybutin for her overactive bladder. States the myrbetriq was to expensive so she didn't pick it up. States she has been having a lot of bladder leakage recently. (next appointment is in May)  Patient is also requesting to have chronic kidney disease removed from her problem list. States she did not end up having kidney disease as it was a medication problem and doesn't like it on her chart.   Routing to [employer]

## 2017-10-26 ENCOUNTER — Ambulatory Visit
Admission: RE | Admit: 2017-10-26 | Discharge: 2017-10-26 | Disposition: A | Discharge status: Home / Self Care | Payer: Medicare Other | Source: Ambulatory Visit | Attending: Family Medicine | Admitting: Family Medicine

## 2017-10-26 DIAGNOSIS — Z1231 Encounter for screening mammogram for malignant neoplasm of breast: Secondary | ICD-10-CM | POA: Diagnosis not present

## 2017-11-09 ENCOUNTER — Encounter: Payer: Self-pay | Admitting: Family Medicine

## 2017-11-09 ENCOUNTER — Ambulatory Visit: Payer: Medicare Other | Admitting: Family Medicine

## 2017-11-09 VITALS — BP 119/79 | HR 67 | Temp 99.1°F | Wt 184.4 lb

## 2017-11-09 DIAGNOSIS — R002 Palpitations: Secondary | ICD-10-CM

## 2017-11-09 DIAGNOSIS — R5383 Other fatigue: Secondary | ICD-10-CM | POA: Insufficient documentation

## 2017-11-09 NOTE — Assessment & Plan Note (Signed)
Likely multi-factorial. Patient has a lot of stress going on with her life right now and is feeling a bit more depressed than usual, she also had mild OSA several years ago but did not need CPAP at that time, she may need one now. We will check labs on her. Encouraged her to reach out to her counselor and her psychiatrist to see if her medications need to be adjdusted. If everything is normal, may need split night sleep study. Patient has been convinced both times that she felt suddenly and acutely fatigued that she was having a heart attack. This has been a constant worry for her and is greatly effecting her life. Although EKG was normal, we will refer to cardiology for discussion of a stress test to help put her mind at ease. Referral generated today.

## 2017-11-09 NOTE — Progress Notes (Signed)
BP 119/79 (BP Location: Left Arm, Patient Position: Sitting, Cuff Size: Normal)   Pulse 67   Temp 99.1 F (37.3 C)   Wt 184 lb 7 oz (83.7 kg)   SpO2 99%   BMI 29.77 kg/m    Subjective:    Patient ID: Elizabeth Malone, female    DOB: 08/14/1951, 67 y.o.   MRN: [id number]  HPI: Elizabeth Malone is a 67 y.o. female  Chief Complaint  Patient presents with  . Fatigue   FATIGUE- was walking up a hill about a week ago and became very very fatigued and was having palpitations, no chest pain, had to stop walking and sit and wait. Was convinced that she was having a heart attack. She notes that she is still not herself since that time. She has been having a lot of stress as her husband has been sick and they are arguing. She takes a lot on herself and notes that she is usually a little manic with her bipolar recently, but is currently  Not. She had a bad stomach virus about 3 weeks ago and has not been feeling herself since. A few weeks before, she also thought that she was having a heart attack and was very tired and very nauseous while at [employer] and was not able to finish even her grocery shopping and had to have someone come pick her up. She has not seen anyone for this until today. Duration:  3 weeks- worse in the last week Severity: moderate  Onset: gradual Context when symptoms started:  walking up a hill Symptoms improve with rest: no  Depressive symptoms: yes Stress/anxiety: yes Insomnia: no  Snoring: no Observed apnea by bed partner: yes- had mild OSA on sleep study several years ago, but didn't need a CPAP Daytime hypersomnolence:no Wakes feeling refreshed: no History of sleep study: yes Dysnea on exertion:  yes Orthopnea/PND: no Chest pain: no Chronic cough: yes Lower extremity edema: no Arthralgias:no Myalgias: no Weakness: yes Rash: no  Relevant past medical, surgical, family and social history reviewed and updated as indicated. Interim medical history since our last visit  reviewed. Allergies and medications reviewed and updated.  Review of Systems  Constitutional: Positive for activity change, fatigue and unexpected weight change. Negative for appetite change, chills, diaphoresis and fever.  HENT: Negative.   Respiratory: Positive for shortness of breath. Negative for cough, choking, chest tightness, wheezing and stridor.   Cardiovascular: Positive for palpitations. Negative for chest pain and leg swelling.  Gastrointestinal: Positive for diarrhea and nausea. Negative for abdominal distention, abdominal pain, anal bleeding, blood in stool, constipation, rectal pain and vomiting.  Skin: Negative.   Neurological: Positive for dizziness and weakness. Negative for tremors, seizures, syncope, facial asymmetry, speech difficulty, light-headedness, numbness and headaches.  Psychiatric/Behavioral: Positive for dysphoric mood. Negative for agitation, behavioral problems, confusion, decreased concentration, hallucinations, self-injury, sleep disturbance and suicidal ideas. The patient is nervous/anxious. The patient is not hyperactive.     Per HPI unless specifically indicated above     Objective:    BP 119/79 (BP Location: Left Arm, Patient Position: Sitting, Cuff Size: Normal)   Pulse 67   Temp 99.1 F (37.3 C)   Wt 184 lb 7 oz (83.7 kg)   SpO2 99%   BMI 29.77 kg/m   Wt Readings from Last 3 Encounters:  11/09/17 184 lb 7 oz (83.7 kg)  10/07/17 188 lb (85.3 kg)  08/17/17 190 lb (86.2 kg)    Physical Exam  Constitutional: She  is oriented to person, place, and time. She appears well-developed and well-nourished. No distress.  HENT:  Head: Normocephalic and atraumatic.  Right Ear: Hearing normal.  Left Ear: Hearing normal.  Nose: Nose normal.  Eyes: Conjunctivae and lids are normal. Right eye exhibits no discharge. Left eye exhibits no discharge. No scleral icterus.  Cardiovascular: Normal rate, regular rhythm, normal heart sounds and intact distal pulses.  Exam reveals no gallop and no friction rub.  No murmur heard. Pulmonary/Chest: Effort normal and breath sounds normal. No respiratory distress. She has no wheezes. She has no rales. She exhibits no tenderness.  Abdominal: Soft. Bowel sounds are normal. She exhibits no distension and no mass. There is no tenderness. There is no rebound and no guarding.  Musculoskeletal: Normal range of motion.  Neurological: She is alert and oriented to person, place, and time.  Skin: Skin is warm, dry and intact. No rash noted. She is not diaphoretic. No erythema. No pallor.  Psychiatric: She has a normal mood and affect. Her speech is normal and behavior is normal. Judgment and thought content normal. Cognition and memory are normal.  Nursing note and vitals reviewed.   Results for orders placed or performed in visit on 41/74/08  Basic metabolic panel  Result Value Ref Range   Glucose 97 65 - 99 mg/dL   BUN 12 8 - 27 mg/dL   Creatinine, Ser 0.95 0.57 - 1.00 mg/dL   GFR calc non Af Amer 63 >59 mL/min/1.73   GFR calc Af Amer 72 >59 mL/min/1.73   BUN/Creatinine Ratio 13 12 - 28   Sodium 142 134 - 144 mmol/L   Potassium 4.7 3.5 - 5.2 mmol/L   Chloride 103 96 - 106 mmol/L   CO2 25 20 - 29 mmol/L   Calcium 10.0 8.7 - 10.3 mg/dL  LP+ALT+AST Piccolo, Waived  Result Value Ref Range   ALT (SGPT) Piccolo, Waived 35 10 - 47 U/L   AST (SGOT) Piccolo, Waived 34 11 - 38 U/L   Cholesterol Piccolo, Waived 167 <200 mg/dL   HDL Chol Piccolo, Waived 51 (L) >59 mg/dL   Triglycerides Piccolo,Waived 195 (H) <150 mg/dL   Chol/HDL Ratio Piccolo,Waive 3.3 mg/dL   LDL Chol Calc Piccolo Waived 77 <100 mg/dL   VLDL Chol Calc Piccolo,Waive 39 (H) <30 mg/dL  Bayer DCA Hb A1c Waived  Result Value Ref Range   Bayer DCA Hb A1c Waived 4.9 <7.0 %      Assessment & Plan:   Problem List Items Addressed This Visit      Other   Fatigue - Primary    Likely multi-factorial. Patient has a lot of stress going on with her life  right now and is feeling a bit more depressed than usual, she also had mild OSA several years ago but did not need CPAP at that time, she may need one now. We will check labs on her. Encouraged her to reach out to her counselor and her psychiatrist to see if her medications need to be adjdusted. If everything is normal, may need split night sleep study. Patient has been convinced both times that she felt suddenly and acutely fatigued that she was having a heart attack. This has been a constant worry for her and is greatly effecting her life. Although EKG was normal, we will refer to cardiology for discussion of a stress test to help put her mind at ease. Referral generated today.       Relevant Orders   CBC with Differential/Platelet  Comprehensive metabolic panel   TSH   VITAMIN D 25 Hydroxy (Vit-D Deficiency, Fractures)   Ambulatory referral to Cardiology    Other Visit Diagnoses    Palpitations       No pain since walking up a hill about a week ago where she felt acutely severely tired. EKG normal. See discussion under fatigue.    Relevant Orders   EKG 12-Lead (Completed)   Ambulatory referral to Cardiology       Follow up plan: Return 3-4 weeks, for follow up.

## 2017-11-10 LAB — CBC WITH DIFFERENTIAL/PLATELET
Basophils Absolute: 0 10*3/uL (ref 0.0–0.2)
Basos: 0 %
EOS (ABSOLUTE): 0.2 10*3/uL (ref 0.0–0.4)
EOS: 2 %
HEMOGLOBIN: 14 g/dL (ref 11.1–15.9)
Hematocrit: 42.7 % (ref 34.0–46.6)
IMMATURE GRANS (ABS): 0 10*3/uL (ref 0.0–0.1)
IMMATURE GRANULOCYTES: 0 %
Lymphocytes Absolute: 2.3 10*3/uL (ref 0.7–3.1)
Lymphs: 28 %
MCH: 31.4 pg (ref 26.6–33.0)
MCHC: 32.8 g/dL (ref 31.5–35.7)
MCV: 96 fL (ref 79–97)
MONOCYTES: 7 %
Monocytes Absolute: 0.6 10*3/uL (ref 0.1–0.9)
NEUTROS PCT: 63 %
Neutrophils Absolute: 5.2 10*3/uL (ref 1.4–7.0)
Platelets: 287 10*3/uL (ref 150–379)
RBC: 4.46 x10E6/uL (ref 3.77–5.28)
RDW: 13.3 % (ref 12.3–15.4)
WBC: 8.2 10*3/uL (ref 3.4–10.8)

## 2017-11-10 LAB — COMPREHENSIVE METABOLIC PANEL
ALBUMIN: 4.4 g/dL (ref 3.6–4.8)
ALT: 116 IU/L — ABNORMAL HIGH (ref 0–32)
AST: 37 IU/L (ref 0–40)
Albumin/Globulin Ratio: 1.8 (ref 1.2–2.2)
Alkaline Phosphatase: 94 IU/L (ref 39–117)
BUN / CREAT RATIO: 17 (ref 12–28)
BUN: 16 mg/dL (ref 8–27)
Bilirubin Total: 0.2 mg/dL (ref 0.0–1.2)
CALCIUM: 9.7 mg/dL (ref 8.7–10.3)
CO2: 23 mmol/L (ref 20–29)
CREATININE: 0.94 mg/dL (ref 0.57–1.00)
Chloride: 106 mmol/L (ref 96–106)
GFR calc Af Amer: 73 mL/min/{1.73_m2} (ref >59)
GFR, EST NON AFRICAN AMERICAN: 63 mL/min/{1.73_m2} (ref >59)
GLOBULIN, TOTAL: 2.4 g/dL (ref 1.5–4.5)
Glucose: 87 mg/dL (ref 65–99)
Potassium: 4.8 mmol/L (ref 3.5–5.2)
SODIUM: 142 mmol/L (ref 134–144)
TOTAL PROTEIN: 6.8 g/dL (ref 6.0–8.5)

## 2017-11-10 LAB — TSH: TSH: 1.19 u[IU]/mL (ref 0.450–4.500)

## 2017-11-10 LAB — VITAMIN D 25 HYDROXY (VIT D DEFICIENCY, FRACTURES): Vit D, 25-Hydroxy: 43.4 ng/mL (ref 30.0–100.0)

## 2017-11-29 ENCOUNTER — Encounter: Payer: Self-pay | Admitting: Psychiatry

## 2017-11-29 ENCOUNTER — Ambulatory Visit: Payer: Medicare Other | Admitting: Psychiatry

## 2017-11-29 ENCOUNTER — Other Ambulatory Visit: Payer: Self-pay

## 2017-11-29 VITALS — BP 121/73 | HR 67 | Temp 97.8°F | Wt 178.6 lb

## 2017-11-29 DIAGNOSIS — F070 Personality change due to known physiological condition: Secondary | ICD-10-CM

## 2017-11-29 DIAGNOSIS — F3130 Bipolar disorder, current episode depressed, mild or moderate severity, unspecified: Secondary | ICD-10-CM

## 2017-11-29 MED ORDER — VENLAFAXINE HCL ER 150 MG PO CP24: 150.0000 mg | ORAL_CAPSULE | Freq: Every day | ORAL | 1 refills | Status: DC

## 2017-11-29 MED ORDER — LITHIUM CARBONATE 300 MG PO TABS: 300.0000 mg | ORAL_TABLET | Freq: Every day | ORAL | 1 refills | Status: DC

## 2017-11-29 MED ORDER — BUSPIRONE HCL 15 MG PO TABS: 15.0000 mg | ORAL_TABLET | Freq: Three times a day (TID) | ORAL | 1 refills | Status: DC

## 2017-11-29 MED ORDER — TRAZODONE HCL 100 MG PO TABS: 100.0000 mg | ORAL_TABLET | Freq: Every day | ORAL | 1 refills | Status: DC

## 2017-11-29 MED ORDER — QUETIAPINE FUMARATE 50 MG PO TABS: 50.0000 mg | ORAL_TABLET | Freq: Every day | ORAL | 1 refills | Status: DC

## 2017-11-29 MED ORDER — GABAPENTIN 300 MG PO CAPS: 300.0000 mg | ORAL_CAPSULE | Freq: Three times a day (TID) | ORAL | 2 refills | Status: DC

## 2017-11-29 MED ORDER — TOPIRAMATE 100 MG PO TABS: 150.0000 mg | ORAL_TABLET | Freq: Every day | ORAL | 1 refills | Status: DC

## 2017-11-29 NOTE — Progress Notes (Signed)
[facility] MD Progress Note  11/29/2017 9:38 AM Elizabeth Malone  MRN:  [id number]   Subjective:    Patient is a 67 year-old married female with history of bipolar disorder who presented for follow-up appointment. Patient reported that she has been doing well and has been living with her husband.  She was concerned about her husband who has been drinking excessively and she stated that she should not help him at this time.  She stated that she took him to the emergency room twice in the past week as he was having rectal bleeding and she feels anxious about him.  She stated that she feels that she might be having some chest pain related to anxiety and she feels breathless when she is walking.  We discussed about increasing physical activity.  She also mentioned that she has been volunteering in the school and also has several pets , cats as she enjoys spending time with them.    Advised patient to improve her relationship with her husband and support him so he can go to the outpatient rehabilitation program.  She appeared apprehensive about the same.  However she will start working on her relationship with her husband as she reported that she feels that everything is her fault.  She currently denied having any suicidal or homicidal ideations or plans she denied having any perceptual disturbances.     She is motivated to exercise and to lose weight.    Principal Problem:  Diagnosis:   Patient Active Problem List   Diagnosis Date Noted  . Fatigue [R53.83] 11/09/2017  . 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 Rectal polyp [K62.1]   . Overactive bladder [N32.81] 08/17/2017  . Advanced care planning/counseling discussion [Z71.89] 02/02/2017  . Bipolar disorder ([place]) [F31.9] 08/05/2015  . Essential hypertension [I10] 08/05/2015  . Hypercholesteremia [E78.00] 08/05/2015    Past Medical History:  Past Medical History:  Diagnosis Date  . Anxiety   . Bipolar disorder ([place])   . CKD (chronic kidney  disease) stage 3, GFR 30-59 ml/min (HCC)   . Complication of anesthesia   . Depression   . GERD (gastroesophageal reflux disease)    occasionally has to use tums  . Headache   . History of diverticulosis   . Overactive bladder   . PONV (postoperative nausea and vomiting)   . Substance abuse ([place])    alcohol    Past Surgical History:  Procedure Laterality Date  . COLONOSCOPY WITH PROPOFOL N/A 10/07/2017   Procedure: COLONOSCOPY WITH PROPOFOL;  Surgeon: [name], MD;  Location: [place];  Service: Endoscopy;  Laterality: N/A;  . FOOT SURGERY Bilateral   . KNEE SURGERY Left   . POLYPECTOMY  10/07/2017   Procedure: POLYPECTOMY;  Surgeon: [name], MD;  Location: [facility];  Service: Endoscopy;;  . TONSILLECTOMY AND ADENOIDECTOMY     Family History:  Family History  Problem Relation Age of Onset  . Alzheimer's disease Mother   . Bipolar disorder Mother   . Depression Mother   . Heart attack Father   . Diabetes Brother   . Obesity Brother   . Depression Maternal Grandfather   . Depression Daughter   . Breast cancer Neg Hx    Family Psychiatric  History: Family history positive for multiple people with depression and a few people who have committed suicide. Social History:  Social History   Substance and Sexual Activity  Alcohol Use Yes  . Alcohol/week: 1.8 oz  . Types:  3 Shots of liquor per week     Social History   Substance and Sexual Activity  Drug Use No    Social History   Socioeconomic History  . Marital status: Married    Spouse name: None  . Number of children: 2  . Years of education: None  . Highest education level: Some college, no degree  Social Needs  . Financial resource strain: Not hard at all  . Food insecurity - worry: Never true  . Food insecurity - inability: Never true  . Transportation needs - medical: No  . Transportation needs - non-medical: No  Occupational History  . Occupation: not employed  Tobacco Use   . Smoking status: Former Smoker    Types: Cigarettes    Start date: 07/09/1967    Last attempt to quit: 07/08/1985    Years since quitting: 32.4  . Smokeless tobacco: Never Used  Substance and Sexual Activity  . Alcohol use: Yes    Alcohol/week: 1.8 oz    Types: 3 Shots of liquor per week  . Drug use: No  . Sexual activity: Not Currently  Other Topics Concern  . None  Social History Narrative  . None   Additional Social History:                        Sleep: Fair  Appetite:  Fair  Current Medications: Current Outpatient Medications  Medication Sig Dispense Refill  . atorvastatin (LIPITOR) 20 MG tablet Take 1 tablet (20 mg total) by mouth daily. 90 tablet 4  . benazepril (LOTENSIN) 10 MG tablet Take 1 tablet (10 mg total) by mouth daily. 30 tablet 4  . busPIRone (BUSPAR) 15 MG tablet Take 1 tablet (15 mg total) by mouth 3 (three) times daily. 270 tablet 1  . Calcium Citrate-Vitamin D (CALCIUM + D PO) Take 1 tablet by mouth 2 (two) times daily.     Marland Kitchen gabapentin (NEURONTIN) 300 MG capsule Take 1 capsule (300 mg total) by mouth 3 (three) times daily. 270 capsule 2  . hydrocortisone valerate ointment (WEST-CORT) 0.2 % APP AA BID UNTIL IMPROVED  0  . lithium 300 MG tablet Take 1 tablet (300 mg total) by mouth at bedtime. 90 tablet 1  . metoprolol succinate (TOPROL-XL) 25 MG 24 hr tablet Take 1 tablet (25 mg total) by mouth daily. 90 tablet 4  . Omega-3 Fatty Acids (FISH OIL) 1000 MG CAPS Take 1,000 mg by mouth 2 (two) times daily.     Marland Kitchen oxybutynin (DITROPAN-XL) 10 MG 24 hr tablet Take 1 tablet (10 mg total) by mouth at bedtime. 90 tablet 3  . polyethylene glycol (GOLYTELY) 236 g solution Drink one 8 oz glass every 20 mins until stools are clear starting at 5:00pm on 10/06/17 4000 mL 0  . QUEtiapine (SEROQUEL) 100 MG tablet Take 1 tablet (100 mg total) by mouth at bedtime. (Patient taking differently: Take 50 mg by mouth at bedtime. ) 90 tablet 1  . topiramate (TOPAMAX) 100  MG tablet Take 1.5 tablets (150 mg total) by mouth daily. 135 tablet 1  . traZODone (DESYREL) 100 MG tablet Take 1 tablet (100 mg total) by mouth at bedtime. 90 tablet 1  . venlafaxine XR (EFFEXOR-XR) 150 MG 24 hr capsule Take 1 capsule (150 mg total) by mouth daily with breakfast. 90 capsule 1   No current facility-administered medications for this visit.     Lab Results: No results found for this or any previous  visit (from the past 48 hour(s)).  Physical Findings: AIMS:  , ,  ,  ,    CIWA:    COWS:     Musculoskeletal: Strength & Muscle Tone: within normal limits Gait & Station: normal Patient leans: N/A  Psychiatric Specialty Exam: ROS  Blood pressure 121/73, pulse 67, temperature 97.8 F (36.6 C), temperature source Oral, weight 178 lb 9.6 oz (81 kg).Body mass index is 28.83 kg/m.  General Appearance: Casual  Eye Contact::  Good  Speech:  Normal Rate  Volume:  Normal  Mood:  Anxious  Affect:  Congruent  Thought Process:  Coherent and Goal Directed  Orientation:  Full (Time, Place, and Person)  Thought Content:  WDL  Suicidal Thoughts:  No  Homicidal Thoughts:  No  Memory:  Immediate;   Good Recent;   Good Remote;   Good  Judgement:  Fair  Insight:  Good  Psychomotor Activity:  Normal  Concentration:  Fair  Recall:  [place] of Knowledge:Fair  Language: Fair  Akathisia:  No  Handed:  Right  AIMS (if indicated):     Assets:  Communication Skills Desire for Improvement Financial Resources/Insurance Housing Intimacy Leisure Time [place] Talents/Skills Transportation  ADL's:  Intact  Cognition: WNL  Sleep:      Treatment Plan Summary:  Discussed with patient at length about the medications treatment risks benefits and alternatives  Mood swings  She will continue Seroquel 100mg  qhs   She will also continue on lithium 300 mg qhs  Will continue on gabapentin 300 mg by mouth TID  a day-  Buspar 15 mg TID    Effexor XR 150  mg daily at this time. Trazodone 100 mg by mouth daily at bedtime when necessary for insomnia. Continue Topamax 150 mg daily.    She will follow-up in 2 months.    More than 50% of the time spent in psychoeducation, counseling and coordination of care.    This note was generated in part or whole with voice recognition software. Voice regonition is usually quite accurate but there are transcription errors that can and very often do occur. I apologize for any typographical errors that were not detected and corrected.     Rainey Pines, MD  11/29/2017, 9:38 AM

## 2017-12-01 ENCOUNTER — Encounter: Payer: Self-pay | Admitting: Family Medicine

## 2017-12-01 ENCOUNTER — Ambulatory Visit: Payer: Medicare Other | Admitting: Family Medicine

## 2017-12-01 VITALS — BP 115/78 | HR 55 | Temp 98.8°F | Wt 182.1 lb

## 2017-12-01 DIAGNOSIS — R079 Chest pain, unspecified: Secondary | ICD-10-CM

## 2017-12-01 DIAGNOSIS — R5383 Other fatigue: Secondary | ICD-10-CM | POA: Diagnosis not present

## 2017-12-01 DIAGNOSIS — F314 Bipolar disorder, current episode depressed, severe, without psychotic features: Secondary | ICD-10-CM

## 2017-12-01 NOTE — Progress Notes (Signed)
BP 115/78 (BP Location: Left Arm, Patient Position: Sitting, Cuff Size: Normal)   Pulse (!) 55   Temp 98.8 F (37.1 C)   Wt 182 lb 1 oz (82.6 kg)   SpO2 100%   BMI 29.39 kg/m    Subjective:    Patient ID: Elizabeth Malone, female    DOB: 07-15-1951, 67 y.o.   MRN: [id number]  HPI: Elizabeth Malone is a 67 y.o. female  Chief Complaint  Patient presents with  . Chest Pain    Patient states that it feels as if there is a band around her chest  . Depression    Patient seen her psychiatrist on Monday and is seeing her therapist at 11:00 today   Saw psychiatry. Has no medicine changes, still very depressed. Seeing counselor today. Continues with a lot of family stress and issues with her husband and his alcoholism. She is seeing Dr. Rockey Situ in 3 weeks for evaluation of her fatigue and the fact when she was walking up a hill she became acutely fatigued about 6 weeks ago and was convinced that she was having a heart attack. EKGs have been normal, and this is more likely mood related, but will send her to try to put her mind at ease and give her 1 less thing to worry about. She notes that she is still walking, but has not walked up the same hill since she had the acute severe fatigue. Discussed again getting her in for sleep study- she would like to hold off at this time, but may consider in the future.   CHEST PAIN- new and different from the last time she was here. Now feeling a band across her chest when she is sitting alone. Happening about every 4-6 days and lasting about 1-1.5 minutes. She is not sure if this is a panic attack, but it's making her very anxious. Time since onset: in the last month Onset: sudden Quality: tight and like she has a band around her check Severity: moderate Location: across whole chest Radiation: none Episode duration: 60-90 seconds Frequency: intermittent, every 4-6 days Related to exertion: no Activity when pain started: sitting by herself Trauma:  no Anxiety/recent stressors: yes Status: worse Treatments attempted: nothing  Current pain status: pain free Shortness of breath: no Cough: yes Nausea: yes- most of the day, not with these episodes Diaphoresis: no Heartburn: yes Palpitations: no  Relevant past medical, surgical, family and social history reviewed and updated as indicated. Interim medical history since our last visit reviewed. Allergies and medications reviewed and updated.  Review of Systems  Constitutional: Positive for fatigue and unexpected weight change. Negative for activity change, appetite change, chills, diaphoresis and fever.  HENT: Negative.   Respiratory: Positive for cough and chest tightness. Negative for apnea, choking, shortness of breath, wheezing and stridor.   Cardiovascular: Positive for chest pain. Negative for palpitations and leg swelling.  Gastrointestinal: Negative.   Neurological: Negative.   Psychiatric/Behavioral: Positive for dysphoric mood and sleep disturbance. Negative for agitation, behavioral problems, confusion, decreased concentration, hallucinations, self-injury and suicidal ideas. The patient is nervous/anxious. The patient is not hyperactive.     Per HPI unless specifically indicated above     Objective:    BP 115/78 (BP Location: Left Arm, Patient Position: Sitting, Cuff Size: Normal)   Pulse (!) 55   Temp 98.8 F (37.1 C)   Wt 182 lb 1 oz (82.6 kg)   SpO2 100%   BMI 29.39 kg/m   Wt Readings  from Last 3 Encounters:  12/01/17 182 lb 1 oz (82.6 kg)  11/29/17 178 lb 9.6 oz (81 kg)  11/09/17 184 lb 7 oz (83.7 kg)    Physical Exam  Constitutional: She is oriented to person, place, and time. She appears well-developed and well-nourished. No distress.  HENT:  Head: Normocephalic and atraumatic.  Right Ear: Hearing normal.  Left Ear: Hearing normal.  Nose: Nose normal.  Eyes: Conjunctivae and lids are normal. Right eye exhibits no discharge. Left eye exhibits no  discharge. No scleral icterus.  Cardiovascular: Normal rate, regular rhythm, normal heart sounds and intact distal pulses. Exam reveals no gallop and no friction rub.  No murmur heard. Pulmonary/Chest: Effort normal and breath sounds normal. No respiratory distress. She has no wheezes. She has no rales. She exhibits no tenderness.  Musculoskeletal: Normal range of motion.  Neurological: She is alert and oriented to person, place, and time.  Skin: Skin is warm, dry and intact. No rash noted. She is not diaphoretic. No erythema. No pallor.  Psychiatric: Her speech is normal and behavior is normal. Judgment and thought content normal. Her mood appears anxious. Cognition and memory are normal. She exhibits a depressed mood.  Nursing note and vitals reviewed.   Results for orders placed or performed in visit on 11/09/17  CBC with Differential/Platelet  Result Value Ref Range   WBC 8.2 3.4 - 10.8 x10E3/uL   RBC 4.46 3.77 - 5.28 x10E6/uL   Hemoglobin 14.0 11.1 - 15.9 g/dL   Hematocrit 42.7 34.0 - 46.6 %   MCV 96 79 - 97 fL   MCH 31.4 26.6 - 33.0 pg   MCHC 32.8 31.5 - 35.7 g/dL   RDW 13.3 12.3 - 15.4 %   Platelets 287 150 - 379 x10E3/uL   Neutrophils 63 Not Estab. %   Lymphs 28 Not Estab. %   Monocytes 7 Not Estab. %   Eos 2 Not Estab. %   Basos 0 Not Estab. %   Neutrophils Absolute 5.2 1.4 - 7.0 x10E3/uL   Lymphocytes Absolute 2.3 0.7 - 3.1 x10E3/uL   Monocytes Absolute 0.6 0.1 - 0.9 x10E3/uL   EOS (ABSOLUTE) 0.2 0.0 - 0.4 x10E3/uL   Basophils Absolute 0.0 0.0 - 0.2 x10E3/uL   Immature Granulocytes 0 Not Estab. %   Immature Grans (Abs) 0.0 0.0 - 0.1 x10E3/uL  Comprehensive metabolic panel  Result Value Ref Range   Glucose 87 65 - 99 mg/dL   BUN 16 8 - 27 mg/dL   Creatinine, Ser 0.94 0.57 - 1.00 mg/dL   GFR calc non Af Amer 63 >59 mL/min/1.73   GFR calc Af Amer 73 >59 mL/min/1.73   BUN/Creatinine Ratio 17 12 - 28   Sodium 142 134 - 144 mmol/L   Potassium 4.8 3.5 - 5.2 mmol/L    Chloride 106 96 - 106 mmol/L   CO2 23 20 - 29 mmol/L   Calcium 9.7 8.7 - 10.3 mg/dL   Total Protein 6.8 6.0 - 8.5 g/dL   Albumin 4.4 3.6 - 4.8 g/dL   Globulin, Total 2.4 1.5 - 4.5 g/dL   Albumin/Globulin Ratio 1.8 1.2 - 2.2   Bilirubin Total 0.2 0.0 - 1.2 mg/dL   Alkaline Phosphatase 94 39 - 117 IU/L   AST 37 0 - 40 IU/L   ALT 116 (H) 0 - 32 IU/L  TSH  Result Value Ref Range   TSH 1.190 0.450 - 4.500 uIU/mL  VITAMIN D 25 Hydroxy (Vit-D Deficiency, Fractures)  Result Value Ref  Range   Vit D, 25-Hydroxy 43.4 30.0 - 100.0 ng/mL      Assessment & Plan:   Problem List Items Addressed This Visit      Other   Bipolar disorder ([place])    Does not appear to be under good control given her social stressors at this time. Continue to follow with counselor and psychiatry. Discussed some breathing exercises today. Continue to monitor. Call with any concerns.       Fatigue    Again, likely mutifactorial and likely significantly influenced by her mood. No medication changes with her psychiatrist, who is seeing her again in 3 months. However, they did discuss a schedule for her to take her medicine to keep the levels more stable in her system. Discussed split-night sleep study. She would like to hold off on that right now. Labs normal last visit. Continue to monitor. Follow up with counselor.        Other Visit Diagnoses    Chest pain, unspecified type    -  Primary   EKG normal except bradycardia. Likely panic attacks. Patient is convinced it is a heart attack. Seeing cardiology in 3 weeks. Discussed breathing. Recheck 1 mo.   Relevant Orders   EKG 12-Lead (Completed)       Follow up plan: Return in about 4 weeks (around 12/29/2017) for follow up.

## 2017-12-01 NOTE — Assessment & Plan Note (Signed)
Again, likely mutifactorial and likely significantly influenced by her mood. No medication changes with her psychiatrist, who is seeing her again in 3 months. However, they did discuss a schedule for her to take her medicine to keep the levels more stable in her system. Discussed split-night sleep study. She would like to hold off on that right now. Labs normal last visit. Continue to monitor. Follow up with counselor.

## 2017-12-01 NOTE — Assessment & Plan Note (Signed)
Does not appear to be under good control given her social stressors at this time. Continue to follow with counselor and psychiatry. Discussed some breathing exercises today. Continue to monitor. Call with any concerns.

## 2017-12-18 NOTE — Progress Notes (Signed)
Cardiology Office Note  Date:  12/21/2017   ID:  Elizabeth Malone, DOB [birth year], MRN [id number]  PCP:  Guadalupe Maple, MD   Chief Complaint  Patient presents with  . OTHER    C/o chest tightness, Fatigue and sob. Meds reviewed verbally with pt.    HPI:  Elizabeth Malone is a 67 year old woman with past medical history of Depression/bipolar/fatigue Smoked for couple years <10 stress and issues with her husband and his alcoholism. Who presents by referral from [facility] for symptoms of shortness of breath and chest pain  Recent episodes of fatigue, shortness of breath, chest tightness One episode while climbing up a hill had to stop a difficult time recovering Felt like there was a band across her chest Few weeks later, "could not get up the hill" Like being in quick sand "felt down for 1 week after People praying for her  Was happening every several days with exertion  She is not sure if this is a panic attack, but it's making her very anxious.  Carotid u/s 02/23/2016 No significant calcific atherosclerotic disease seen.   Other episodes as below At [employer], felt bad,  Pushing cart,  Possible anxiety attack Friend came over to help her, drove her car home  Two weeks earlier was putting wood into front loader, no problem  Most of the time, good energy, hyper Sometimes "down to the dark side"  EKG personally reviewed by myself on todays visit Shows normal sinus rhythm rate 61 bpm no significant ST or T wave changes  Dad died in his 64s Mom is 83 yo  PMH:   has a past medical history of Anxiety, Bipolar disorder ([place]), CKD (chronic kidney disease) stage 3, GFR 30-59 ml/min (HCC), Complication of anesthesia, Depression, GERD (gastroesophageal reflux disease), Headache, History of diverticulosis, Hyperlipidemia, Overactive bladder, PONV (postoperative nausea and vomiting), and Substance abuse ([place]).  PSH:    Past Surgical History:  Procedure Laterality Date  .  COLONOSCOPY WITH PROPOFOL N/A 10/07/2017   Procedure: COLONOSCOPY WITH PROPOFOL;  Surgeon: [name], MD;  Location: [facility];  Service: Endoscopy;  Laterality: N/A;  . FOOT SURGERY Bilateral   . KNEE SURGERY Left   . POLYPECTOMY  10/07/2017   Procedure: POLYPECTOMY;  Surgeon: [name], MD;  Location[facility];  Service: Endoscopy;;  . TONSILLECTOMY AND ADENOIDECTOMY      Current Outpatient Medications  Medication Sig Dispense Refill  . atorvastatin (LIPITOR) 20 MG tablet Take 1 tablet (20 mg total) by mouth daily. 90 tablet 4  . benazepril (LOTENSIN) 10 MG tablet Take 1 tablet (10 mg total) by mouth daily. 30 tablet 4  . busPIRone (BUSPAR) 15 MG tablet Take 1 tablet (15 mg total) by mouth 3 (three) times daily. 270 tablet 1  . Calcium Citrate-Vitamin D (CALCIUM + D PO) Take 1 tablet by mouth 2 (two) times daily.     Marland Kitchen gabapentin (NEURONTIN) 300 MG capsule Take 1 capsule (300 mg total) by mouth 3 (three) times daily. 270 capsule 2  . hydrocortisone valerate ointment (WEST-CORT) 0.2 % APP AA BID UNTIL IMPROVED  0  . lithium 300 MG tablet Take 1 tablet (300 mg total) by mouth at bedtime. 90 tablet 1  . metoprolol succinate (TOPROL-XL) 25 MG 24 hr tablet Take 1 tablet (25 mg total) by mouth daily. 90 tablet 4  . Omega-3 Fatty Acids (FISH OIL) 1000 MG CAPS Take 1,000 mg by mouth 2 (two) times daily.     Marland Kitchen  oxybutynin (DITROPAN-XL) 10 MG 24 hr tablet Take 1 tablet (10 mg total) by mouth at bedtime. 90 tablet 3  . QUEtiapine (SEROQUEL) 50 MG tablet Take 1 tablet (50 mg total) by mouth at bedtime. 90 tablet 1  . topiramate (TOPAMAX) 100 MG tablet Take 1.5 tablets (150 mg total) by mouth daily. 135 tablet 1  . traZODone (DESYREL) 100 MG tablet Take 1 tablet (100 mg total) by mouth at bedtime. 90 tablet 1  . venlafaxine XR (EFFEXOR-XR) 150 MG 24 hr capsule Take 1 capsule (150 mg total) by mouth daily with breakfast. 90 capsule 1   No current facility-administered  medications for this visit.      Allergies:   Ace inhibitors; Fluoxetine; Hydrocodone-chlorpheniramine; Paroxetine hcl; Penicillin g benzathine; Penicillin v potassium; Clindamycin hcl; and Lamotrigine   Social History:  The patient  reports that she quit smoking about 32 years ago. Her smoking use included cigarettes. She started smoking about 50 years ago. She has never used smokeless tobacco. She reports that she drinks about 1.8 oz of alcohol per week. She reports that she does not use drugs.   Family History:   family history includes Alzheimer's disease in her mother; Bipolar disorder in her mother; Depression in her daughter, maternal grandfather, and mother; Diabetes in her brother; Heart attack in her father and paternal grandfather; Obesity in her brother; Stroke in her maternal grandmother and paternal grandmother.    Review of Systems: Review of Systems  Constitutional: Negative.   Respiratory: Positive for shortness of breath.   Cardiovascular: Positive for chest pain.  Gastrointestinal: Negative.   Musculoskeletal: Negative.   Neurological: Negative.   Psychiatric/Behavioral: Negative.   All other systems reviewed and are negative.    PHYSICAL EXAM: VS:  BP 124/80 (BP Location: Right Arm, Patient Position: Sitting, Cuff Size: Normal)   Pulse 61   Ht 5\' 6"  (1.676 m)   Wt 176 lb 8 oz (80.1 kg)   BMI 28.49 kg/m  , BMI Body mass index is 28.49 kg/m. GEN: Well nourished, well developed, in no acute distress  HEENT: normal  Neck: no JVD, carotid bruits, or masses Cardiac: RRR; no murmurs, rubs, or gallops,no edema  Respiratory:  clear to auscultation bilaterally, normal work of breathing GI: soft, nontender, nondistended, + BS MS: no deformity or atrophy  Skin: warm and dry, no rash Neuro:  Strength and sensation are intact Psych: euthymic mood, full affect    Recent Labs: 11/09/2017: ALT 116; BUN 16; Creatinine, Ser 0.94; Hemoglobin 14.0; Platelets 287; Potassium  4.8; Sodium 142; TSH 1.190    Lipid Panel Lab Results  Component Value Date   CHOL 167 08/17/2017   HDL 56 02/02/2017   LDLCALC 70 02/02/2017   TRIG 195 (H) 08/17/2017      Wt Readings from Last 3 Encounters:  12/21/17 176 lb 8 oz (80.1 kg)  12/01/17 182 lb 1 oz (82.6 kg)  11/09/17 184 lb 7 oz (83.7 kg)       ASSESSMENT AND PLAN:  Chest pain, unspecified type - Plan: EKG 12-Lead Typical and atypical features Moderate risk factors, strong family history, she is a smoker History of hyperlipidemia now treated Symptoms coming and going Recommended stress echocardiogram as she feels she would be able to treadmill Unable to exclude other issues such as anxiety or panic Hypercholesteremia Cholesterol is at goal on the current lipid regimen. No changes to the medications were made.  Essential hypertension Blood pressure is well controlled on today's visit. No changes  made to the medications.  Bipolar disorder, current episode depressed, severe, without psychotic features ([place]) Reports having periods where she is hyper other periods where she is down Managed by primary care  Disposition:   F/U as needed   Total encounter time more than 60 minutes  Greater than 50% was spent in counseling and coordination of care with the patient    Orders Placed This Encounter  Procedures  . EKG 12-Lead     Signed, Esmond Plants, M.D., Ph.D. 12/21/2017  [place], [place]

## 2017-12-21 ENCOUNTER — Encounter: Payer: Self-pay | Admitting: Cardiovascular Disease

## 2017-12-21 ENCOUNTER — Ambulatory Visit: Payer: Medicare Other | Admitting: Cardiovascular Disease

## 2017-12-21 ENCOUNTER — Telehealth: Payer: Self-pay | Admitting: Family Medicine

## 2017-12-21 VITALS — BP 124/80 | HR 61 | Ht 66.0 in | Wt 176.5 lb

## 2017-12-21 DIAGNOSIS — E7800 Pure hypercholesterolemia, unspecified: Secondary | ICD-10-CM

## 2017-12-21 DIAGNOSIS — I10 Essential (primary) hypertension: Secondary | ICD-10-CM | POA: Diagnosis not present

## 2017-12-21 DIAGNOSIS — R079 Chest pain, unspecified: Secondary | ICD-10-CM

## 2017-12-21 DIAGNOSIS — F314 Bipolar disorder, current episode depressed, severe, without psychotic features: Secondary | ICD-10-CM | POA: Diagnosis not present

## 2017-12-21 DIAGNOSIS — R0602 Shortness of breath: Secondary | ICD-10-CM

## 2017-12-21 NOTE — Telephone Encounter (Signed)
Spoke with the patient. We cancelled the appointment with Dr Wynetta Emery and kept the appointment with Dr Jeananne Rama

## 2017-12-21 NOTE — Telephone Encounter (Signed)
She is fine to wait until she sees Dr. Jeananne Rama. Let us know if she needs anything.

## 2017-12-21 NOTE — Patient Instructions (Addendum)
Medication Instructions:   No medication changes made  Labwork:  No new labs needed  Testing/Procedures:  We will order a stress echo for chest pain, shortness of breath Your physician has requested that you have a stress echocardiogram. For further information please visit [email or web address]. Please follow instruction sheet as given.   Do not drink or eat foods with caffeine for 24 hours before the test. (Chocolate, coffee, tea, or energy drinks)  If you use an inhaler, bring it with you to the test.  Do not smoke for 4 hours before the test.  Wear comfortable shoes and clothing.   Follow-Up: It was a pleasure seeing you in the office today. Please call us if you have new issues that need to be addressed before your next appt.  [phone]  Your physician wants you to follow-up in:  As needed  If you need a refill on your cardiac medications before your next appointment, please call your pharmacy.  For educational health videos Log in to : [email or web address] Or : [email or web address], password : [email or web address]  Exercise Stress Echocardiogram An exercise stress echocardiogram is a test that checks how well your heart is working. For this test, you will walk on a treadmill to make your heart beat faster. This test uses sound waves (ultrasound) and a computer to make pictures (images) of your heart. These pictures will be taken before you exercise and after you exercise. What happens before the procedure?  Follow instructions from your doctor about what you cannot eat or drink before the test.  Do not drink or eat anything that has caffeine in it. Stop having caffeine for 24 hours before the test.  Ask your doctor about changing or stopping your normal medicines. This is important if you take diabetes medicines or blood thinners. Ask your doctor if you should take your medicines with water before the test.  If you use an inhaler, bring it to the test.  Do not use any products that have  nicotine or tobacco in them, such as cigarettes and e-cigarettes. Stop using them for 4 hours before the test. If you need help quitting, ask your doctor.  Wear comfortable shoes and clothing. What happens during the procedure?  You will be hooked up to a TV screen. Your doctor will watch the screen to see how fast your heart beats during the test.  Before you exercise, a computer will make a picture of your heart. To do this: ? A gel will be put on your chest. ? A wand will be moved over the gel. ? Sound waves from the wand will go to the computer to make the picture.  Your will start walking on a treadmill. The treadmill will start at a slow speed. It will get faster a little bit at a time. When you walk faster, your heart will beat faster.  The treadmill will be stopped when your heart is working hard.  You will lie down right away so another picture of your heart can be taken.  The test will take 30-60 minutes. What happens after the procedure?  Your heart rate and blood pressure will be watched after the test.  If your doctor says that you can, you may: ? Eat what you usually eat. ? Do your normal activities. ? Take medicines like normal. Summary  An exercise stress echocardiogram is a test that checks how well your heart is working.  Follow instructions about what you cannot eat or drink before the test. Ask your doctor  if you should take your normal medicines before the test.  Stop having caffeine for 24 hours before the test. Do not use anything with nicotine or tobacco in it for 4 hours before the test.  A computer will take a picture of your heart before you walk on a treadmill. It will take another picture when you are done walking.  Your heart rate and blood pressure will be watched after the test. This information is not intended to replace advice given to you by your health care provider. Make sure you discuss any questions you have with your health care  provider. Document Released: 06/28/2009 Document Revised: 05/24/2016 Document Reviewed: 05/24/2016 Elsevier Interactive Patient Education  2017 [employer].

## 2017-12-21 NOTE — Telephone Encounter (Signed)
Patient would has an appointment regarding her heart on 5/14 and has a follow up with you on 4/23 to discuss the issues that she was seen for by the cardiologist, she is wanting to know if you think she should cancel the appointment she has with you on the 23rd until after the heart test since she has an appointment with Dr Jeananne Rama on 5/28.  Please advise.  Thanks

## 2018-01-04 ENCOUNTER — Ambulatory Visit: Payer: Self-pay | Admitting: Family Medicine

## 2018-01-25 ENCOUNTER — Other Ambulatory Visit: Payer: Medicare Other

## 2018-02-04 ENCOUNTER — Ambulatory Visit (INDEPENDENT_AMBULATORY_CARE_PROVIDER_SITE_OTHER): Payer: Medicare Other

## 2018-02-04 ENCOUNTER — Other Ambulatory Visit: Payer: Self-pay | Admitting: Unknown Physician Specialty

## 2018-02-04 VITALS — BP 106/60 | HR 55 | Temp 98.2°F | Resp 16 | Ht 66.0 in | Wt 178.1 lb

## 2018-02-04 DIAGNOSIS — I10 Essential (primary) hypertension: Secondary | ICD-10-CM | POA: Diagnosis not present

## 2018-02-04 DIAGNOSIS — Z0000 Encounter for general adult medical examination without abnormal findings: Secondary | ICD-10-CM

## 2018-02-04 DIAGNOSIS — N183 Chronic kidney disease, stage 3 unspecified: Secondary | ICD-10-CM

## 2018-02-04 DIAGNOSIS — E039 Hypothyroidism, unspecified: Secondary | ICD-10-CM | POA: Diagnosis not present

## 2018-02-04 DIAGNOSIS — E7800 Pure hypercholesterolemia, unspecified: Secondary | ICD-10-CM

## 2018-02-04 LAB — URINALYSIS, ROUTINE W REFLEX MICROSCOPIC
BILIRUBIN UA: NEGATIVE
GLUCOSE, UA: NEGATIVE
Ketones, UA: NEGATIVE
Nitrite, UA: NEGATIVE
PROTEIN UA: NEGATIVE
RBC UA: NEGATIVE
SPEC GRAV UA: 1.015 (ref 1.005–1.030)
UUROB: 0.2 mg/dL (ref 0.2–1.0)
pH, UA: 7 (ref 5.0–7.5)

## 2018-02-04 LAB — MICROSCOPIC EXAMINATION: RBC, UA: NONE SEEN /hpf (ref 0–2)

## 2018-02-04 MED ORDER — BETAMETHASONE DIPROPIONATE AUG 0.05 % EX CREA: TOPICAL_CREAM | Freq: Two times a day (BID) | CUTANEOUS | 0 refills | Status: DC

## 2018-02-04 MED ORDER — VALACYCLOVIR HCL 1 G PO TABS: 1000.0000 mg | ORAL_TABLET | Freq: Three times a day (TID) | ORAL | 0 refills | Status: AC

## 2018-02-04 NOTE — Progress Notes (Signed)
Subjective:   Elizabeth Malone is a 67 y.o. female who presents for an Initial Medicare Annual Wellness Visit.  Review of Systems      Cardiac Risk Factors include: hypertension;advanced age (>54men, >92 women);dyslipidemia     Objective:    Today's Vitals   02/04/18 0809  BP: 106/60  Pulse: (!) 55  Resp: 16  Temp: 98.2 F (36.8 C)  TempSrc: Temporal  Weight: 178 lb 1.6 oz (80.8 kg)  Height: 5\' 6"  (1.676 m)   Body mass index is 28.75 kg/m.  Advanced Directives 02/04/2018 10/07/2017 02/23/2016 02/23/2016 07/11/2015  Does Patient Have a Medical Advance Directive? No No Yes No No  Type of Advance Directive - - Living will - -  Does patient want to make changes to medical advance directive? - - Yes - Spiritual care consult ordered - -  Would patient like information on creating a medical advance directive? Yes (MAU/Ambulatory/Procedural Areas - Information given) Yes (MAU/Ambulatory/Procedural Areas - Information given) Yes - Educational materials given;Yes - Spiritual care consult ordered No - patient declined information -  Some encounter information is confidential and restricted. Go to Review Flowsheets activity to see all data.    Current Medications (verified) Outpatient Encounter Medications as of 02/04/2018  Medication Sig  . atorvastatin (LIPITOR) 20 MG tablet Take 1 tablet (20 mg total) by mouth daily.  . benazepril (LOTENSIN) 10 MG tablet Take 1 tablet (10 mg total) by mouth daily.  . busPIRone (BUSPAR) 15 MG tablet Take 1 tablet (15 mg total) by mouth 3 (three) times daily.  . Calcium Citrate-Vitamin D (CALCIUM + D PO) Take 1 tablet by mouth 2 (two) times daily.   . fluorouracil (EFUDEX) 5 % cream Apply topically 2 (two) times daily. Plus calsipotriene Use 4-5 days  . fluticasone (CUTIVATE) 0.005 % ointment Apply 1 application topically 2 (two) times daily.  Marland Kitchen gabapentin (NEURONTIN) 300 MG capsule Take 1 capsule (300 mg total) by mouth 3 (three) times daily.  .  hydrocortisone valerate ointment (WEST-CORT) 0.2 % APP AA BID UNTIL IMPROVED  . lithium 300 MG tablet Take 1 tablet (300 mg total) by mouth at bedtime.  . metoprolol succinate (TOPROL-XL) 25 MG 24 hr tablet Take 1 tablet (25 mg total) by mouth daily.  . mupirocin cream (BACTROBAN) 2 % Apply 1 application topically 2 (two) times daily.  . Omega-3 Fatty Acids (FISH OIL) 1000 MG CAPS Take 1,000 mg by mouth 2 (two) times daily.   Marland Kitchen oxybutynin (DITROPAN-XL) 10 MG 24 hr tablet Take 1 tablet (10 mg total) by mouth at bedtime.  Marland Kitchen QUEtiapine (SEROQUEL) 50 MG tablet Take 1 tablet (50 mg total) by mouth at bedtime.  . topiramate (TOPAMAX) 100 MG tablet Take 1.5 tablets (150 mg total) by mouth daily.  . traZODone (DESYREL) 100 MG tablet Take 1 tablet (100 mg total) by mouth at bedtime.  . triamcinolone cream (KENALOG) 0.1 % APPLY TO RASH TWICE DAILY AS NEEDED  . venlafaxine XR (EFFEXOR-XR) 150 MG 24 hr capsule Take 1 capsule (150 mg total) by mouth daily with breakfast.   No facility-administered encounter medications on file as of 02/04/2018.     Allergies (verified) Ace inhibitors; Fluoxetine; Hydrocodone-chlorpheniramine; Paroxetine hcl; Penicillin g benzathine; Penicillin v potassium; Clindamycin hcl; and Lamotrigine   History: Past Medical History:  Diagnosis Date  . Anxiety   . Bipolar disorder ([place])   . CKD (chronic kidney disease) stage 3, GFR 30-59 ml/min (HCC)   . Complication of anesthesia   .  Depression   . GERD (gastroesophageal reflux disease)    occasionally has to use tums  . Headache   . History of diverticulosis   . Hyperlipidemia   . Overactive bladder   . PONV (postoperative nausea and vomiting)   . Substance abuse ([place])    alcohol   Past Surgical History:  Procedure Laterality Date  . COLONOSCOPY WITH PROPOFOL N/A 10/07/2017   Procedure: COLONOSCOPY WITH PROPOFOL;  Surgeon: [name], MD;  Location: [facility];  Service: Endoscopy;  Laterality: N/A;  . FOOT  SURGERY Bilateral   . KNEE SURGERY Left   . POLYPECTOMY  10/07/2017   Procedure: POLYPECTOMY;  Surgeon: [name], MD;  Location: [facility];  Service: Endoscopy;;  . TONSILLECTOMY AND ADENOIDECTOMY     Family History  Problem Relation Age of Onset  . Alzheimer's disease Mother   . Bipolar disorder Mother   . Depression Mother   . Heart attack Father   . Diabetes Brother   . Obesity Brother   . Depression Maternal Grandfather   . Depression Daughter   . Stroke Maternal Grandmother   . Stroke Paternal Grandmother   . Heart attack Paternal Grandfather   . Breast cancer Neg Hx    Social History   Socioeconomic History  . Marital status: Married    Spouse name: Not on file  . Number of children: 2  . Years of education: Not on file  . Highest education level: Some college, no degree  Occupational History  . 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 Financial resource strain: Not hard at all  . Food insecurity:    Worry: Never true    Inability: Never true  . Transportation needs:    Medical: No    Non-medical: No  Tobacco Use  . Smoking status: Former Smoker    Types: Cigarettes    Start date: 07/09/1967    Last attempt to quit: 07/08/1985    Years since quitting: 32.6  . Smokeless tobacco: Never Used  Substance and Sexual Activity  . Alcohol use: Yes    Alcohol/week: 1.8 oz    Types: 3 Shots of liquor per week    Comment: occassional  . Drug use: No  . Sexual activity: Not Currently  Lifestyle  . Physical activity:    Days per week: 0 days    Minutes per session: 0 min  . Stress: Very much  Relationships  . Social connections:    Talks on phone: Once a week    Gets together: Once a week    Attends religious service: More than 4 times per year    Active member of club or organization: Yes    Attends meetings of clubs or organizations: More than 4 times per year    Relationship status: Married  Other Topics Concern  . Not on file  Social  History Narrative  . Not on file    Tobacco Counseling Counseling given: Not Answered   Clinical Intake:  Pre-visit preparation completed: Yes  Pain : No/denies pain     Nutritional Status: BMI 25 -29 Overweight Nutritional Risks: None Diabetes: No  How often do you need to have someone help you when you read instructions, pamphlets, or other written materials from your doctor or pharmacy?: 1 - Never What is the last grade level you completed in school?: college   Interpreter Needed?: No  Information entered by :: Charelle Petrakis,LPN    Activities of Daily Living In your present  state of health, do you have any difficulty performing the following activities: 02/04/2018 10/07/2017  Hearing? N Y  Vision? N Y  Difficulty concentrating or making decisions? N N  Walking or climbing stairs? N N  Dressing or bathing? N N  Doing errands, shopping? N -  Preparing Food and eating ? N -  Using the Toilet? N -  In the past six months, have you accidently leaked urine? N -  Do you have problems with loss of bowel control? Y -  Comment depends if needed -  Managing your Medications? N -  Managing your Finances? N -  Housekeeping or managing your Housekeeping? N -  Some recent data might be hidden     Immunizations and Health Maintenance Immunization History  Administered Date(s) Administered  . Influenza-Unspecified 06/05/2015, 06/04/2016, 06/28/2017  . Pneumococcal Conjugate-13 10/20/2016  . Tdap 03/23/2013  . Zoster 01/13/2011  . Zoster Recombinat (Shingrix) 02/19/2017, 05/10/2017   Health Maintenance Due  Topic Date Due  . PNA vac Low Risk Adult (2 of 2 - PPSV23) 10/20/2017    Patient Care Team: Guadalupe Maple, MD as PCP - General (Family Medicine) Jeananne Rama Jeannette How, MD as PCP - Family Medicine (Family Medicine)  Indicate any recent Medical Services you may have received from other than [facility] providers in the past year (date may be approximate).     Assessment:    This is a routine wellness examination for [place].  Hearing/Vision screen Vision Screening Comments: Goes to Dr.Woodard annually   Dietary issues and exercise activities discussed: Current Exercise Habits: The patient does not participate in regular exercise at present, Exercise limited by: None identified  Goals    None     Depression Screen PHQ 2/9 Scores 02/04/2018 08/17/2017 08/05/2016  PHQ - 2 Score 0 0 0    Fall Risk Fall Risk  02/04/2018 08/17/2017 12/01/2016  Falls in the past year? Yes No No  Number falls in past yr: 2 or more - -  Injury with Fall? No - -  Risk Factor Category  High Fall Risk - -  Follow up Falls prevention discussed - -    Is the patient's home free of loose throw rugs in walkways, pet beds, electrical cords, etc?   no      Grab bars in the bathroom? yes      Handrails on the stairs?   yes      Adequate lighting?   yes  Timed Get Up and Go Performed Completed in 8 seconds with no use of assistive devices, steady gait. No intervention needed at this time.   Cognitive Function:     6CIT Screen 02/04/2018  What Year? 0 points  What month? 0 points  What time? 0 points  Count back from 20 0 points  Months in reverse 0 points  Repeat phrase 0 points  Total Score 0    Screening Tests Health Maintenance  Topic Date Due  . PNA vac Low Risk Adult (2 of 2 - PPSV23) 10/20/2017  . INFLUENZA VACCINE  04/14/2018  . MAMMOGRAM  10/27/2019  . COLONOSCOPY  10/07/2022  . TETANUS/TDAP  03/24/2023  . DEXA SCAN  Completed  . Hepatitis C Screening  Completed    Qualifies for Shingles Vaccine? Up to date  Cancer Screenings: Lung: Low Dose CT Chest recommended if Age 26-80 years, 30 pack-year currently smoking OR have quit w/in 15years. Patient does not qualify. Breast: Up to date on Mammogram? Yes   Up to  date of Bone Density/Dexa? Yes Colorectal: completed 10/07/2017  Additional Screenings:  Hepatitis C Screening: completed 02/04/2016     Plan:    I  have personally reviewed and addressed the Medicare Annual Wellness questionnaire and have noted the following in the patient's chart:  A. Medical and social history B. Use of alcohol, tobacco or illicit drugs  C. Current medications and supplements D. Functional ability and status E.  Nutritional status F.  Physical activity G. Advance directives H. List of other physicians I.  Hospitalizations, surgeries, and ER visits in previous 12 months J.  [place] such as hearing and vision if needed, cognitive and depression L. Referrals and appointments   In addition, I have reviewed and discussed with patient certain preventive protocols, quality metrics, and best practice recommendations. A written personalized care plan for preventive services as well as general preventive health recommendations were provided to patient.   Signed,  Tyler Aas, LPN Nurse Health Advisor   Nurse Notes: Patient presented today with red raised rash on back- states it burns a little when the shower hits it and has been there for 3 weeks. Regino Schultze to see

## 2018-02-04 NOTE — Patient Instructions (Addendum)
Elizabeth Malone , Thank you for taking time to come for your Medicare Wellness Visit. I appreciate your ongoing commitment to your health goals. Please review the following plan we discussed and let me know if I can assist you in the future.   Screening recommendations/referrals: Colonoscopy: completed 10/07/2017 Mammogram: completed 10/26/2017 Bone Density: completed 08/29/2012 Recommended yearly ophthalmology/optometry visit for glaucoma screening and checkup Recommended yearly dental visit for hygiene and checkup  Vaccinations: Influenza vaccine: due 05/2018 Pneumococcal vaccine: pneumovax 23 due - deferred to 02/08/2018 Tdap vaccine: up to date Shingles vaccine: up to date    Advanced directives: Advance directive discussed with you today. I have provided a copy for you to complete at home and have notarized. Once this is complete please bring a copy in to our office so we can scan it into your chart.  Conditions/risks identified: recommend drinking at least 6-8 glasses of water a day.   Next appointment: Follow up on 02/08/2018 at 10:00am with Dr.Crissman, Follow up in one year for your annual wellness exam.    Preventive Care 65 Years and Older, Female Preventive care refers to lifestyle choices and visits with your health care provider that can promote health and wellness. What does preventive care include?  A yearly physical exam. This is also called an annual well check.  Dental exams once or twice a year.  Routine eye exams. Ask your health care provider how often you should have your eyes checked.  Personal lifestyle choices, including:  Daily care of your teeth and gums.  Regular physical activity.  Eating a healthy diet.  Avoiding tobacco and drug use.  Limiting alcohol use.  Practicing safe sex.  Taking low-dose aspirin every day.  Taking vitamin and mineral supplements as recommended by your health care provider. What happens during an annual well check? The  services and screenings done by your health care provider during your annual well check will depend on your age, overall health, lifestyle risk factors, and family history of disease. Counseling  Your health care provider may ask you questions about your:  Alcohol use.  Tobacco use.  Drug use.  Emotional well-being.  Home and relationship well-being.  Sexual activity.  Eating habits.  History of falls.  Memory and ability to understand (cognition).  Work and work Statistician.  Reproductive health. Screening  You may have the following tests or measurements:  Height, weight, and BMI.  Blood pressure.  Lipid and cholesterol levels. These may be checked every 5 years, or more frequently if you are over 15 years old.  Skin check.  Lung cancer screening. You may have this screening every year starting at age 36 if you have a 30-pack-year history of smoking and currently smoke or have quit within the past 15 years.  Fecal occult blood test (FOBT) of the stool. You may have this test every year starting at age 67.  Flexible sigmoidoscopy or colonoscopy. You may have a sigmoidoscopy every 5 years or a colonoscopy every 10 years starting at age 60.  Hepatitis C blood test.  Hepatitis B blood test.  Sexually transmitted disease (STD) testing.  Diabetes screening. This is done by checking your blood sugar (glucose) after you have not eaten for a while (fasting). You may have this done every 1-3 years.  Bone density scan. This is done to screen for osteoporosis. You may have this done starting at age 62.  Mammogram. This may be done every 1-2 years. Talk to your health care provider about how  often you should have regular mammograms. Talk with your health care provider about your test results, treatment options, and if necessary, the need for more tests. Vaccines  Your health care provider may recommend certain vaccines, such as:  Influenza vaccine. This is recommended  every year.  Tetanus, diphtheria, and acellular pertussis (Tdap, Td) vaccine. You may need a Td booster every 10 years.  Zoster vaccine. You may need this after age 26.  Pneumococcal 13-valent conjugate (PCV13) vaccine. One dose is recommended after age 52.  Pneumococcal polysaccharide (PPSV23) vaccine. One dose is recommended after age 56. Talk to your health care provider about which screenings and vaccines you need and how often you need them. This information is not intended to replace advice given to you by your health care provider. Make sure you discuss any questions you have with your health care provider. Document Released: 09/27/2015 Document Revised: 05/20/2016 Document Reviewed: 07/02/2015 Elsevier Interactive Patient Education  2017 [place] Prevention in the Home Falls can cause injuries. They can happen to people of all ages. There are many things you can do to make your home safe and to help prevent falls. What can I do on the outside of my home?  Regularly fix the edges of walkways and driveways and fix any cracks.  Remove anything that might make you trip as you walk through a door, such as a raised step or threshold.  Trim any bushes or trees on the path to your home.  Use bright outdoor lighting.  Clear any walking paths of anything that might make someone trip, such as rocks or tools.  Regularly check to see if handrails are loose or broken. Make sure that both sides of any steps have handrails.  Any raised decks and porches should have guardrails on the edges.  Have any leaves, snow, or ice cleared regularly.  Use sand or salt on walking paths during winter.  Clean up any spills in your garage right away. This includes oil or grease spills. What can I do in the bathroom?  Use night lights.  Install grab bars by the toilet and in the tub and shower. Do not use towel bars as grab bars.  Use non-skid mats or decals in the tub or shower.  If  you need to sit down in the shower, use a plastic, non-slip stool.  Keep the floor dry. Clean up any water that spills on the floor as soon as it happens.  Remove soap buildup in the tub or shower regularly.  Attach bath mats securely with double-sided non-slip rug tape.  Do not have throw rugs and other things on the floor that can make you trip. What can I do in the bedroom?  Use night lights.  Make sure that you have a light by your bed that is easy to reach.  Do not use any sheets or blankets that are too big for your bed. They should not hang down onto the floor.  Have a firm chair that has side arms. You can use this for support while you get dressed.  Do not have throw rugs and other things on the floor that can make you trip. What can I do in the kitchen?  Clean up any spills right away.  Avoid walking on wet floors.  Keep items that you use a lot in easy-to-reach places.  If you need to reach something above you, use a strong step stool that has a grab bar.  Keep electrical  cords out of the way.  Do not use floor polish or wax that makes floors slippery. If you must use wax, use non-skid floor wax.  Do not have throw rugs and other things on the floor that can make you trip. What can I do with my stairs?  Do not leave any items on the stairs.  Make sure that there are handrails on both sides of the stairs and use them. Fix handrails that are broken or loose. Make sure that handrails are as long as the stairways.  Check any carpeting to make sure that it is firmly attached to the stairs. Fix any carpet that is loose or worn.  Avoid having throw rugs at the top or bottom of the stairs. If you do have throw rugs, attach them to the floor with carpet tape.  Make sure that you have a light switch at the top of the stairs and the bottom of the stairs. If you do not have them, ask someone to add them for you. What else can I do to help prevent falls?  Wear shoes  that:  Do not have high heels.  Have rubber bottoms.  Are comfortable and fit you well.  Are closed at the toe. Do not wear sandals.  If you use a stepladder:  Make sure that it is fully opened. Do not climb a closed stepladder.  Make sure that both sides of the stepladder are locked into place.  Ask someone to hold it for you, if possible.  Clearly mark and make sure that you can see:  Any grab bars or handrails.  First and last steps.  Where the edge of each step is.  Use tools that help you move around (mobility aids) if they are needed. These include:  Canes.  Walkers.  Scooters.  Crutches.  Turn on the lights when you go into a dark area. Replace any light bulbs as soon as they burn out.  Set up your furniture so you have a clear path. Avoid moving your furniture around.  If any of your floors are uneven, fix them.  If there are any pets around you, be aware of where they are.  Review your medicines with your doctor. Some medicines can make you feel dizzy. This can increase your chance of falling. Ask your doctor what other things that you can do to help prevent falls. This information is not intended to replace advice given to you by your health care provider. Make sure you discuss any questions you have with your health care provider. Document Released: 06/27/2009 Document Revised: 02/06/2016 Document Reviewed: 10/05/2014 Elsevier Interactive Patient Education  2017 [employer].

## 2018-02-05 LAB — CBC WITH DIFFERENTIAL/PLATELET
BASOS ABS: 0 10*3/uL (ref 0.0–0.2)
Basos: 0 %
EOS (ABSOLUTE): 0.3 10*3/uL (ref 0.0–0.4)
Eos: 5 %
Hematocrit: 41.1 % (ref 34.0–46.6)
Hemoglobin: 13.4 g/dL (ref 11.1–15.9)
IMMATURE GRANS (ABS): 0 10*3/uL (ref 0.0–0.1)
Immature Granulocytes: 0 %
LYMPHS ABS: 1.6 10*3/uL (ref 0.7–3.1)
LYMPHS: 29 %
MCH: 31.2 pg (ref 26.6–33.0)
MCHC: 32.6 g/dL (ref 31.5–35.7)
MCV: 96 fL (ref 79–97)
MONOCYTES: 5 %
MONOS ABS: 0.3 10*3/uL (ref 0.1–0.9)
NEUTROS ABS: 3.3 10*3/uL (ref 1.4–7.0)
Neutrophils: 61 %
PLATELETS: 236 10*3/uL (ref 150–450)
RBC: 4.3 x10E6/uL (ref 3.77–5.28)
RDW: 13.5 % (ref 12.3–15.4)
WBC: 5.5 10*3/uL (ref 3.4–10.8)

## 2018-02-05 LAB — LIPID PANEL W/O CHOL/HDL RATIO
Cholesterol, Total: 168 mg/dL (ref 100–199)
HDL: 69 mg/dL (ref >39)
LDL Calculated: 83 mg/dL (ref 0–99)
Triglycerides: 80 mg/dL (ref 0–149)
VLDL Cholesterol Cal: 16 mg/dL (ref 5–40)

## 2018-02-05 LAB — COMPREHENSIVE METABOLIC PANEL
ALK PHOS: 74 IU/L (ref 39–117)
ALT: 17 IU/L (ref 0–32)
AST: 13 IU/L (ref 0–40)
Albumin/Globulin Ratio: 2 (ref 1.2–2.2)
Albumin: 4.4 g/dL (ref 3.6–4.8)
BILIRUBIN TOTAL: 0.3 mg/dL (ref 0.0–1.2)
BUN/Creatinine Ratio: 23 (ref 12–28)
BUN: 19 mg/dL (ref 8–27)
CO2: 25 mmol/L (ref 20–29)
Calcium: 9.8 mg/dL (ref 8.7–10.3)
Chloride: 108 mmol/L — ABNORMAL HIGH (ref 96–106)
Creatinine, Ser: 0.84 mg/dL (ref 0.57–1.00)
GFR calc Af Amer: 84 mL/min/{1.73_m2} (ref >59)
GFR calc non Af Amer: 73 mL/min/{1.73_m2} (ref >59)
GLUCOSE: 90 mg/dL (ref 65–99)
Globulin, Total: 2.2 g/dL (ref 1.5–4.5)
POTASSIUM: 5.1 mmol/L (ref 3.5–5.2)
SODIUM: 143 mmol/L (ref 134–144)
Total Protein: 6.6 g/dL (ref 6.0–8.5)

## 2018-02-05 LAB — TSH: TSH: 0.599 u[IU]/mL (ref 0.450–4.500)

## 2018-02-08 ENCOUNTER — Encounter: Payer: Self-pay | Admitting: Family Medicine

## 2018-02-08 ENCOUNTER — Encounter: Payer: Medicare Other | Admitting: Family Medicine

## 2018-02-08 ENCOUNTER — Ambulatory Visit: Payer: Medicare Other | Admitting: Family Medicine

## 2018-02-08 VITALS — BP 103/67 | HR 60 | Temp 98.7°F | Ht 64.7 in | Wt 176.6 lb

## 2018-02-08 DIAGNOSIS — E7800 Pure hypercholesterolemia, unspecified: Secondary | ICD-10-CM | POA: Diagnosis not present

## 2018-02-08 DIAGNOSIS — I129 Hypertensive chronic kidney disease with stage 1 through stage 4 chronic kidney disease, or unspecified chronic kidney disease: Secondary | ICD-10-CM

## 2018-02-08 DIAGNOSIS — I10 Essential (primary) hypertension: Secondary | ICD-10-CM

## 2018-02-08 DIAGNOSIS — Z1231 Encounter for screening mammogram for malignant neoplasm of breast: Secondary | ICD-10-CM | POA: Diagnosis not present

## 2018-02-08 DIAGNOSIS — Z0000 Encounter for general adult medical examination without abnormal findings: Secondary | ICD-10-CM

## 2018-02-08 DIAGNOSIS — N3281 Overactive bladder: Secondary | ICD-10-CM | POA: Diagnosis not present

## 2018-02-08 DIAGNOSIS — F314 Bipolar disorder, current episode depressed, severe, without psychotic features: Secondary | ICD-10-CM

## 2018-02-08 DIAGNOSIS — Z23 Encounter for immunization: Secondary | ICD-10-CM

## 2018-02-08 DIAGNOSIS — Z13820 Encounter for screening for osteoporosis: Secondary | ICD-10-CM

## 2018-02-08 DIAGNOSIS — N183 Chronic kidney disease, stage 3 (moderate): Secondary | ICD-10-CM

## 2018-02-08 DIAGNOSIS — Z1239 Encounter for other screening for malignant neoplasm of breast: Secondary | ICD-10-CM

## 2018-02-08 LAB — MICROALBUMIN, URINE WAIVED
CREATININE, URINE WAIVED: 100 mg/dL (ref 10–300)
MICROALB, UR WAIVED: 30 mg/L — AB (ref 0–19)
Microalb/Creat Ratio: <30 mg/g (ref <30)

## 2018-02-08 MED ORDER — OXYBUTYNIN CHLORIDE ER 10 MG PO TB24: 10.0000 mg | ORAL_TABLET | Freq: Every day | ORAL | 3 refills | Status: DC

## 2018-02-08 MED ORDER — ATORVASTATIN CALCIUM 20 MG PO TABS: 20.0000 mg | ORAL_TABLET | Freq: Every day | ORAL | 4 refills | Status: DC

## 2018-02-08 MED ORDER — BENAZEPRIL HCL 5 MG PO TABS: 5.0000 mg | ORAL_TABLET | Freq: Every day | ORAL | 1 refills | Status: DC

## 2018-02-08 MED ORDER — METOPROLOL SUCCINATE ER 25 MG PO TB24: 25.0000 mg | ORAL_TABLET | Freq: Every day | ORAL | 4 refills | Status: DC

## 2018-02-08 NOTE — Assessment & Plan Note (Signed)
Stable. Continue current regimen. Continue to monitor. Call with any concerns.  

## 2018-02-08 NOTE — Assessment & Plan Note (Signed)
Overtreated with weight loss. Will decrease benazepril to 5mg  daily and recheck in 6 months. Call with any concerns.
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
## 2018-02-08 NOTE — Patient Instructions (Addendum)
Colonoscopy 10/07/17- due 2024

## 2018-02-08 NOTE — Assessment & Plan Note (Signed)
Rechecking levels today. Await results. Call with any concerns.  

## 2018-02-08 NOTE — Assessment & Plan Note (Signed)
Stable. Continue current regimen. Continue to monitor. Continue to follow with Dr. Gretel Acre. Lithium level drawn today. Will forward results.

## 2018-02-09 LAB — LITHIUM LEVEL: Lithium Lvl: 0.5 mmol/L — ABNORMAL LOW (ref 0.6–1.2)

## 2018-02-17 ENCOUNTER — Telehealth: Payer: Self-pay | Admitting: *Deleted

## 2018-02-17 NOTE — Telephone Encounter (Signed)
No answer. Left detailed message, ok per DPR, on cell phone with date and time of stress echo tomorrow and instructions as listed on AVS.

## 2018-02-18 ENCOUNTER — Other Ambulatory Visit: Payer: Medicare Other

## 2018-02-28 ENCOUNTER — Ambulatory Visit: Payer: Medicare Other | Admitting: Psychiatry

## 2018-03-02 ENCOUNTER — Telehealth: Payer: Self-pay | Admitting: *Deleted

## 2018-03-02 NOTE — Telephone Encounter (Signed)
Called patient as a friendly reminder for stress echo tomorrow at 10:30 am. Patient aware to not have caffeine for 24 hours prior, to wear comfortable walking shoes, no smoking for 4 hours prior and to not take metoprolol tonight or in the morning. She verbalized understanding.

## 2018-03-03 ENCOUNTER — Ambulatory Visit (INDEPENDENT_AMBULATORY_CARE_PROVIDER_SITE_OTHER): Payer: Medicare Other

## 2018-03-03 DIAGNOSIS — R079 Chest pain, unspecified: Secondary | ICD-10-CM | POA: Diagnosis not present

## 2018-03-04 LAB — ECHOCARDIOGRAM STRESS TEST
CHL CUP MPHR: 154 {beats}/min
Estimated workload: 8.5 METS
Exercise duration (min): 7 min
Exercise duration (sec): 0 s
Peak HR: 173 {beats}/min
Percent HR: 112 %
RPE: 15
Rest HR: 78 {beats}/min

## 2018-03-07 ENCOUNTER — Encounter: Payer: Self-pay | Admitting: Cardiovascular Disease

## 2018-03-11 NOTE — Telephone Encounter (Signed)
Good exercise level on stress test, heart rate up to 170s, No significant abnormalities noted, No indication based on what we see that there is a lung issue though Echo is not like a CT scan that looks at the lungs specifically If shortness of breath persists despite a walking program, PMD could consider additional lung workup

## 2018-03-21 ENCOUNTER — Other Ambulatory Visit: Payer: Self-pay

## 2018-03-21 ENCOUNTER — Ambulatory Visit: Payer: Medicare Other | Admitting: Psychiatry

## 2018-03-21 ENCOUNTER — Encounter: Payer: Self-pay | Admitting: Psychiatry

## 2018-03-21 VITALS — BP 139/81 | HR 66 | Temp 98.0°F | Wt 179.8 lb

## 2018-03-21 DIAGNOSIS — F3130 Bipolar disorder, current episode depressed, mild or moderate severity, unspecified: Secondary | ICD-10-CM | POA: Diagnosis not present

## 2018-03-21 DIAGNOSIS — F070 Personality change due to known physiological condition: Secondary | ICD-10-CM | POA: Diagnosis not present

## 2018-03-21 MED ORDER — GABAPENTIN 300 MG PO CAPS: 300.0000 mg | ORAL_CAPSULE | Freq: Three times a day (TID) | ORAL | 2 refills | Status: DC

## 2018-03-21 MED ORDER — QUETIAPINE FUMARATE 50 MG PO TABS: 50.0000 mg | ORAL_TABLET | Freq: Every day | ORAL | 1 refills | Status: DC

## 2018-03-21 MED ORDER — TOPIRAMATE 100 MG PO TABS: 150.0000 mg | ORAL_TABLET | Freq: Every day | ORAL | 1 refills | Status: DC

## 2018-03-21 MED ORDER — BUSPIRONE HCL 15 MG PO TABS: 15.0000 mg | ORAL_TABLET | Freq: Three times a day (TID) | ORAL | 1 refills | Status: DC

## 2018-03-21 MED ORDER — VENLAFAXINE HCL ER 150 MG PO CP24: 150.0000 mg | ORAL_CAPSULE | Freq: Every day | ORAL | 1 refills | Status: DC

## 2018-03-21 MED ORDER — LITHIUM CARBONATE 300 MG PO TABS
300.0000 mg | ORAL_TABLET | Freq: Every day | ORAL | 1 refills | Status: DC
Start: 2018-03-21 — End: 2018-06-20

## 2018-03-21 MED ORDER — TRAZODONE HCL 100 MG PO TABS: 100.0000 mg | ORAL_TABLET | Freq: Every day | ORAL | 1 refills | Status: DC

## 2018-03-21 NOTE — Progress Notes (Signed)
[facility] MD Progress Note  03/21/2018 1:52 PM Elizabeth Malone  MRN:  [id number]   Subjective:    Patient is a 67 year-old married female with history of bipolar disorder who presented for follow-up appointment. Patient reported that she has been doing well and has been living with her husband.  They is a 58 wedding anniversary for the patient.  She reported that she is concerned about her husband who has been drinking excessively and she could not help him.  She reported that she went to as she will to meet her daughter and the kids have been advising her that she should leave her husband as she has not been able to support him and he does not stop drinking.  She reported that she is concerned about his behavior.  We discussed about the patient taking him to the hospital but she reported that she is scared of him as he will be very upset with the patient.  She reported that she has already taking him to the emergency room twice in the past for rectal bleeding and he does not stand the risk of alcohol use.   She reported that she will talk to the local magistrate about placing him on the IVC and we will proceed from there.  She stated that she has been compliant with her medications and they are helpful.  She does not want to change any medications at this time.  She appeared anxious and concerned about her husband.  She is able to contract for safety at this time.        Principal Problem:  Diagnosis:   Patient Active Problem List   Diagnosis Date Noted  . 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 Rectal polyp [K62.1]   . Overactive bladder [N32.81] 08/17/2017  . Advanced care planning/counseling discussion [Z71.89] 02/02/2017  . Bipolar disorder ([place]) [F31.9] 08/05/2015  . Essential hypertension [I10] 08/05/2015  . Hypercholesteremia [E78.00] 08/05/2015    Past Medical History:  Past Medical History:  Diagnosis Date  . Anxiety   . Bipolar disorder ([place])   . CKD (chronic kidney disease)  stage 3, GFR 30-59 ml/min (HCC)   . Complication of anesthesia   . Depression   . GERD (gastroesophageal reflux disease)    occasionally has to use tums  . Headache   . History of diverticulosis   . Hyperlipidemia   . Overactive bladder   . PONV (postoperative nausea and vomiting)   . Substance abuse ([place])    alcohol    Past Surgical History:  Procedure Laterality Date  . COLONOSCOPY WITH PROPOFOL N/A 10/07/2017   Procedure: COLONOSCOPY WITH PROPOFOL;  Surgeon: [name], MD;  Location: [facility];  Service: Endoscopy;  Laterality: N/A;  . FOOT SURGERY Bilateral   . KNEE SURGERY Left   . POLYPECTOMY  10/07/2017   Procedure: POLYPECTOMY;  Surgeon: [name], MD;  Location: [facility];  Service: Endoscopy;;  . TONSILLECTOMY AND ADENOIDECTOMY     Family History:  Family History  Problem Relation Age of Onset  . Alzheimer's disease Mother   . Bipolar disorder Mother   . Depression Mother   . Heart attack Father   . Diabetes Brother   . Obesity Brother   . Depression Maternal Grandfather   . Depression Daughter   . Stroke Maternal Grandmother   . Stroke Paternal Grandmother   . Heart attack Paternal Grandfather   . Breast cancer Neg Hx    Family Psychiatric  History: Family history positive for multiple people with depression and a few people who have committed suicide. Social History:  Social History   Substance and Sexual Activity  Alcohol Use Yes  . Alcohol/week: 1.8 oz  . Types: 3 Shots of liquor per week   Comment: occassional     Social History   Substance and Sexual Activity  Drug Use No    Social History   Socioeconomic History  . Marital status: Married    Spouse name: Not on file  . Number of children: 2  . Years of education: Not on file  . Highest education level: Some college, no degree  Occupational History  . 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 Financial resource strain: Not hard at all  . Food insecurity:     Worry: Never true    Inability: Never true  . Transportation needs:    Medical: No    Non-medical: No  Tobacco Use  . Smoking status: Former Smoker    Types: Cigarettes    Start date: 07/09/1967    Last attempt to quit: 07/08/1985    Years since quitting: 32.7  . Smokeless tobacco: Never Used  Substance and Sexual Activity  . Alcohol use: Yes    Alcohol/week: 1.8 oz    Types: 3 Shots of liquor per week    Comment: occassional  . Drug use: No  . Sexual activity: Not Currently  Lifestyle  . Physical activity:    Days per week: 0 days    Minutes per session: 0 min  . Stress: Very much  Relationships  . Social connections:    Talks on phone: Once a week    Gets together: Once a week    Attends religious service: More than 4 times per year    Active member of club or organization: Yes    Attends meetings of clubs or organizations: More than 4 times per year    Relationship status: Married  Other Topics Concern  . Not on file  Social History Narrative  . Not on file   Additional Social History:                        Sleep: Fair  Appetite:  Fair  Current Medications: Current Outpatient Medications  Medication Sig Dispense Refill  . atorvastatin (LIPITOR) 20 MG tablet Take 1 tablet (20 mg total) by mouth daily. 90 tablet 4  . augmented betamethasone dipropionate (DIPROLENE AF) 0.05 % cream Apply topically 2 (two) times daily. 30 g 0  . benazepril (LOTENSIN) 5 MG tablet Take 1 tablet (5 mg total) by mouth daily. 90 tablet 1  . busPIRone (BUSPAR) 15 MG tablet Take 1 tablet (15 mg total) by mouth 3 (three) times daily. 270 tablet 1  . Calcium Citrate-Vitamin D (CALCIUM + D PO) Take 1 tablet by mouth 2 (two) times daily.     . fluorouracil (EFUDEX) 5 % cream Apply topically 2 (two) times daily. Plus calsipotriene Use 4-5 days    . fluticasone (CUTIVATE) 0.005 % ointment Apply 1 application topically 2 (two) times daily.    Marland Kitchen gabapentin (NEURONTIN) 300 MG capsule  Take 1 capsule (300 mg total) by mouth 3 (three) times daily. 270 capsule 2  . hydrocortisone valerate ointment (WEST-CORT) 0.2 % APP AA BID UNTIL IMPROVED  0  . lithium 300 MG tablet Take 1 tablet (300 mg total) by mouth at bedtime. 90 tablet 1  . metoprolol succinate (TOPROL-XL)  25 MG 24 hr tablet Take 1 tablet (25 mg total) by mouth daily. 90 tablet 4  . mupirocin cream (BACTROBAN) 2 % Apply 1 application topically 2 (two) times daily.    . Omega-3 Fatty Acids (FISH OIL) 1000 MG CAPS Take 1,000 mg by mouth 2 (two) times daily.     Marland Kitchen oxybutynin (DITROPAN-XL) 10 MG 24 hr tablet Take 1 tablet (10 mg total) by mouth at bedtime. 90 tablet 3  . QUEtiapine (SEROQUEL) 50 MG tablet Take 1 tablet (50 mg total) by mouth at bedtime. 90 tablet 1  . topiramate (TOPAMAX) 100 MG tablet Take 1.5 tablets (150 mg total) by mouth daily. 135 tablet 1  . traZODone (DESYREL) 100 MG tablet Take 1 tablet (100 mg total) by mouth at bedtime. 90 tablet 1  . Triamcinolone Acetonide ([employer] NASAL ALLERGY SPRAY NA) Place into the nose.    . triamcinolone cream (KENALOG) 0.1 % APPLY TO RASH TWICE DAILY AS NEEDED  0  . venlafaxine XR (EFFEXOR-XR) 150 MG 24 hr capsule Take 1 capsule (150 mg total) by mouth daily with breakfast. 90 capsule 1   No current facility-administered medications for this visit.     Lab Results: No results found for this or any previous visit (from the past 48 hour(s)).  Physical Findings: AIMS:  , ,  ,  ,    CIWA:    COWS:     Musculoskeletal: Strength & Muscle Tone: within normal limits Gait & Station: normal Patient leans: N/A  Psychiatric Specialty Exam: ROS  Blood pressure 139/81, pulse 66, temperature 98 F (36.7 C), temperature source Oral, weight 179 lb 12.8 oz (81.6 kg).Body mass index is 30.2 kg/m.  General Appearance: Casual  Eye Contact::  Good  Speech:  Normal Rate  Volume:  Normal  Mood:  Anxious  Affect:  Congruent  Thought Process:  Coherent and Goal Directed   Orientation:  Full (Time, Place, and Person)  Thought Content:  WDL  Suicidal Thoughts:  No  Homicidal Thoughts:  No  Memory:  Immediate;   Good Recent;   Good Remote;   Good  Judgement:  Fair  Insight:  Good  Psychomotor Activity:  Normal  Concentration:  Fair  Recall:  [place] of Knowledge:Fair  Language: Fair  Akathisia:  No  Handed:  Right  AIMS (if indicated):     Assets:  Communication Skills Desire for Improvement Financial Resources/Insurance Housing Intimacy Leisure Time [place] Talents/Skills Transportation  ADL's:  Intact  Cognition: WNL  Sleep:      Treatment Plan Summary:  Discussed with patient at length about the medications treatment risks benefits and alternatives  Mood swings  She will continue Seroquel 100mg  qhs   She will also continue on lithium 300 mg qhs  Will continue on gabapentin 300 mg by mouth TID  a day-  Buspar 15 mg TID   Effexor XR 150  mg daily at this time. Trazodone 100 mg by mouth daily at bedtime when necessary for insomnia. Continue Topamax 150 mg daily.    She will follow-up in 2 months.    More than 50% of the time spent in psychoeducation, counseling and coordination of care.    This note was generated in part or whole with voice recognition software. Voice regonition is usually quite accurate but there are transcription errors that can and very often do occur. I apologize for any typographical errors that were not detected and corrected.  Rainey Pines, MD  03/21/2018, 1:52 PM

## 2018-03-28 ENCOUNTER — Ambulatory Visit (INDEPENDENT_AMBULATORY_CARE_PROVIDER_SITE_OTHER): Payer: Medicare Other

## 2018-03-28 DIAGNOSIS — Z23 Encounter for immunization: Secondary | ICD-10-CM | POA: Diagnosis not present

## 2018-03-28 NOTE — Patient Instructions (Signed)

## 2018-06-14 ENCOUNTER — Telehealth: Payer: Self-pay | Admitting: Family Medicine

## 2018-06-14 NOTE — Telephone Encounter (Signed)
Patient notified

## 2018-06-14 NOTE — Telephone Encounter (Signed)
Call pt  Looking at patient's medicine list is best to just take Tylenol Advil or Aleve

## 2018-06-14 NOTE — Telephone Encounter (Signed)
Pt came in states that she fell and hurt her back and is going to a convention on Thursday. Wants to know if she can be prescribed something or what should she do

## 2018-06-20 ENCOUNTER — Other Ambulatory Visit: Payer: Self-pay

## 2018-06-20 ENCOUNTER — Encounter: Payer: Self-pay | Admitting: Psychiatry

## 2018-06-20 ENCOUNTER — Ambulatory Visit (INDEPENDENT_AMBULATORY_CARE_PROVIDER_SITE_OTHER): Payer: Medicare Other | Admitting: Psychiatry

## 2018-06-20 VITALS — BP 126/79 | HR 64 | Temp 98.2°F

## 2018-06-20 DIAGNOSIS — F3130 Bipolar disorder, current episode depressed, mild or moderate severity, unspecified: Secondary | ICD-10-CM | POA: Diagnosis not present

## 2018-06-20 DIAGNOSIS — F070 Personality change due to known physiological condition: Secondary | ICD-10-CM | POA: Diagnosis not present

## 2018-06-20 MED ORDER — QUETIAPINE FUMARATE 50 MG PO TABS: 50.0000 mg | ORAL_TABLET | Freq: Every day | ORAL | 1 refills | Status: DC

## 2018-06-20 MED ORDER — VENLAFAXINE HCL ER 150 MG PO CP24: 150.0000 mg | ORAL_CAPSULE | Freq: Every day | ORAL | 1 refills | Status: DC

## 2018-06-20 MED ORDER — TRAZODONE HCL 100 MG PO TABS: 100.0000 mg | ORAL_TABLET | Freq: Every day | ORAL | 1 refills | Status: DC

## 2018-06-20 MED ORDER — TOPIRAMATE 100 MG PO TABS: 150.0000 mg | ORAL_TABLET | Freq: Every day | ORAL | 1 refills | Status: DC

## 2018-06-20 MED ORDER — BUSPIRONE HCL 15 MG PO TABS: 15.0000 mg | ORAL_TABLET | Freq: Three times a day (TID) | ORAL | 1 refills | Status: DC

## 2018-06-20 MED ORDER — LITHIUM CARBONATE 300 MG PO TABS: 300.0000 mg | ORAL_TABLET | Freq: Every day | ORAL | 1 refills | Status: DC

## 2018-06-20 MED ORDER — GABAPENTIN 300 MG PO CAPS: 300.0000 mg | ORAL_CAPSULE | Freq: Three times a day (TID) | ORAL | 2 refills | Status: DC

## 2018-06-20 NOTE — Progress Notes (Signed)
[facility] MD Progress Note  06/20/2018 9:50 AM Elizabeth Malone  MRN:  [id number]   Subjective:    Patient is a 67 year-old married female with history of bipolar disorder who presented for follow-up appointment. Patient reported that she has been very busy attending meetings for [employer] and she tripped and fell and had a fracture in her right toe as well as sprain in her left foot.  She was wearing a boot.  She reported that she is recuperating well.  She stated that she feels that they are threatening her she organized other meetings as well with her friends in the area.  She reported that she has supporters and they are helping her.  She does not appear concerned and stated that her husband has been helping her in going to the church.  She stated that she does not feel concerned at this time.  She stated that her husband has also stopped drinking and he has significantly improved.  She has good relationships with him at this time.  Patient currently denied having any suicidal homicidal ideations or plans.  She is compliant with her medications and they are helping her.    She discussed about her symptoms in detail.  She reported that she has not been losing any weight and the medications are helping her.  She cannot walk at this time due to the recent injuries in her foot.  She is sleeping well and her mood anger or anxiety and paranoia are under control.    She is suicidal ideations and perceptual disturbances.      She is able to contract for safety at this time.        Principal Problem:  Diagnosis:   Patient Active Problem List   Diagnosis Date Noted  . 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 Rectal polyp [K62.1]   . Overactive bladder [N32.81] 08/17/2017  . Advanced care planning/counseling discussion [Z71.89] 02/02/2017  . Bipolar disorder ([place]) [F31.9] 08/05/2015  . Essential hypertension [I10] 08/05/2015  . Hypercholesteremia [E78.00] 08/05/2015    Past Medical History:   Past Medical History:  Diagnosis Date  . Anxiety   . Bipolar disorder ([place])   . CKD (chronic kidney disease) stage 3, GFR 30-59 ml/min (HCC)   . Complication of anesthesia   . Depression   . GERD (gastroesophageal reflux disease)    occasionally has to use tums  . Headache   . History of diverticulosis   . Hyperlipidemia   . Overactive bladder   . PONV (postoperative nausea and vomiting)   . Substance abuse ([place])    alcohol    Past Surgical History:  Procedure Laterality Date  . COLONOSCOPY WITH PROPOFOL N/A 10/07/2017   Procedure: COLONOSCOPY WITH PROPOFOL;  Surgeon: [name], MD;  Location: [facility];  Service: Endoscopy;  Laterality: N/A;  . FOOT SURGERY Bilateral   . KNEE SURGERY Left   . POLYPECTOMY  10/07/2017   Procedure: POLYPECTOMY;  Surgeon: [name], MD;  Location: [facility];  Service: Endoscopy;;  . TONSILLECTOMY AND ADENOIDECTOMY     Family History:  Family History  Problem Relation Age of Onset  . Alzheimer's disease Mother   . Bipolar disorder Mother   . Depression Mother   . Heart attack Father   . Diabetes Brother   . Obesity Brother   . Depression Maternal Grandfather   . Depression Daughter   . Stroke Maternal Grandmother   . Stroke Paternal Grandmother   .  Heart attack Paternal Grandfather   . Breast cancer Neg Hx    Family Psychiatric  History: Family history positive for multiple people with depression and a few people who have committed suicide. Social History:  Social History   Substance and Sexual Activity  Alcohol Use Yes  . Alcohol/week: 3.0 standard drinks  . Types: 3 Shots of liquor per week   Comment: occassional     Social History   Substance and Sexual Activity  Drug Use No    Social History   Socioeconomic History  . Marital status: Married    Spouse name: Not on file  . Number of children: 2  . Years of education: Not on file  . Highest education level: Some college, no degree  Occupational  History  . 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 Financial resource strain: Not hard at all  . Food insecurity:    Worry: Never true    Inability: Never true  . Transportation needs:    Medical: No    Non-medical: No  Tobacco Use  . Smoking status: Former Smoker    Types: Cigarettes    Start date: 07/09/1967    Last attempt to quit: 07/08/1985    Years since quitting: 32.9  . Smokeless tobacco: Never Used  Substance and Sexual Activity  . Alcohol use: Yes    Alcohol/week: 3.0 standard drinks    Types: 3 Shots of liquor per week    Comment: occassional  . Drug use: No  . Sexual activity: Not Currently  Lifestyle  . Physical activity:    Days per week: 0 days    Minutes per session: 0 min  . Stress: Very much  Relationships  . Social connections:    Talks on phone: Once a week    Gets together: Once a week    Attends religious service: More than 4 times per year    Active member of club or organization: Yes    Attends meetings of clubs or organizations: More than 4 times per year    Relationship status: Married  Other Topics Concern  . Not on file  Social History Narrative  . Not on file   Additional Social History:                        Sleep: Fair  Appetite:  Fair  Current Medications: Current Outpatient Medications  Medication Sig Dispense Refill  . atorvastatin (LIPITOR) 20 MG tablet Take 1 tablet (20 mg total) by mouth daily. 90 tablet 4  . augmented betamethasone dipropionate (DIPROLENE AF) 0.05 % cream Apply topically 2 (two) times daily. 30 g 0  . benazepril (LOTENSIN) 5 MG tablet Take 1 tablet (5 mg total) by mouth daily. 90 tablet 1  . busPIRone (BUSPAR) 15 MG tablet Take 1 tablet (15 mg total) by mouth 3 (three) times daily. 270 tablet 1  . Calcium Citrate-Vitamin D (CALCIUM + D PO) Take 1 tablet by mouth 2 (two) times daily.     . fluorouracil (EFUDEX) 5 % cream Apply topically 2 (two) times daily. Plus calsipotriene Use 4-5 days     . fluticasone (CUTIVATE) 0.005 % ointment Apply 1 application topically 2 (two) times daily.    Marland Kitchen gabapentin (NEURONTIN) 300 MG capsule Take 1 capsule (300 mg total) by mouth 3 (three) times daily. 270 capsule 2  . hydrocortisone valerate ointment (WEST-CORT) 0.2 % APP AA BID UNTIL IMPROVED  0  . lithium 300 MG  tablet Take 1 tablet (300 mg total) by mouth at bedtime. 90 tablet 1  . metoprolol succinate (TOPROL-XL) 25 MG 24 hr tablet Take 1 tablet (25 mg total) by mouth daily. 90 tablet 4  . mupirocin cream (BACTROBAN) 2 % Apply 1 application topically 2 (two) times daily.    . Omega-3 Fatty Acids (FISH OIL) 1000 MG CAPS Take 1,000 mg by mouth 2 (two) times daily.     Marland Kitchen oxybutynin (DITROPAN-XL) 10 MG 24 hr tablet Take 1 tablet (10 mg total) by mouth at bedtime. 90 tablet 3  . QUEtiapine (SEROQUEL) 50 MG tablet Take 1 tablet (50 mg total) by mouth at bedtime. 90 tablet 1  . topiramate (TOPAMAX) 100 MG tablet Take 1.5 tablets (150 mg total) by mouth daily. 135 tablet 1  . traZODone (DESYREL) 100 MG tablet Take 1 tablet (100 mg total) by mouth at bedtime. 90 tablet 1  . Triamcinolone Acetonide ([employer]S NASAL ALLERGY SPRAY NA) Place into the nose.    . triamcinolone cream (KENALOG) 0.1 % APPLY TO RASH TWICE DAILY AS NEEDED  0  . venlafaxine XR (EFFEXOR-XR) 150 MG 24 hr capsule Take 1 capsule (150 mg total) by mouth daily with breakfast. 90 capsule 1   No current facility-administered medications for this visit.     Lab Results: No results found for this or any previous visit (from the past 48 hour(s)).  Physical Findings: AIMS:  , ,  ,  ,    CIWA:    COWS:     Musculoskeletal: Strength & Muscle Tone: within normal limits Gait & Station: normal Patient leans: N/A  Psychiatric Specialty Exam: Review of Systems  Psychiatric/Behavioral: The patient is nervous/anxious.     Blood pressure 126/79, pulse 64, temperature 98.2 F (36.8 C), temperature source Oral.There is no height or weight on  file to calculate BMI.  General Appearance: Casual  Eye Contact::  Good  Speech:  Normal Rate  Volume:  Normal  Mood:  Anxious  Affect:  Congruent  Thought Process:  Coherent and Goal Directed  Orientation:  Full (Time, Place, and Person)  Thought Content:  WDL  Suicidal Thoughts:  No  Homicidal Thoughts:  No  Memory:  Immediate;   Good Recent;   Good Remote;   Good  Judgement:  Fair  Insight:  Good  Psychomotor Activity:  Normal  Concentration:  Fair  Recall:  [place] of Knowledge:Fair  Language: Fair  Akathisia:  No  Handed:  Right  AIMS (if indicated):     Assets:  Communication Skills Desire for Improvement Financial Resources/Insurance Housing Intimacy Leisure Time [place] Talents/Skills Transportation  ADL's:  Intact  Cognition: WNL  Sleep:      Treatment Plan Summary:  Discussed with patient at length about the medications treatment risks benefits and alternatives  Mood swings  She will continue Seroquel 100mg  qhs   She will also continue on lithium 300 mg qhs  Will continue on gabapentin 300 mg by mouth TID  a day-  Buspar 15 mg TID   Effexor XR 150  mg daily at this time. Trazodone 100 mg by mouth daily at bedtime when necessary for insomnia. Continue Topamax 150 mg daily.    She will follow-up in 2 months.    More than 50% of the time spent in psychoeducation, counseling and coordination of care.    This note was generated in part or whole with voice recognition software. Voice regonition is usually quite accurate  but there are transcription errors that can and very often do occur. I apologize for any typographical errors that were not detected and corrected.     Rainey Pines, MD  06/20/2018, 9:50 AM

## 2018-06-24 ENCOUNTER — Other Ambulatory Visit: Payer: Self-pay | Admitting: Family Medicine

## 2018-06-24 DIAGNOSIS — I10 Essential (primary) hypertension: Secondary | ICD-10-CM

## 2018-06-24 NOTE — Telephone Encounter (Signed)
Requested Prescriptions  Pending Prescriptions Disp Refills  . benazepril (LOTENSIN) 5 MG tablet [Pharmacy Med Name: BENAZEPRIL  5MG   TAB] 90 tablet 1    Sig: TAKE 1 TABLET BY MOUTH  DAILY     Cardiovascular:  ACE Inhibitors Passed - 06/24/2018  4:50 AM      Passed - Cr in normal range and within 180 days    Creatinine, Ser  Date Value Ref Range Status  02/04/2018 0.84 0.57 - 1.00 mg/dL Final         Passed - K in normal range and within 180 days    Potassium  Date Value Ref Range Status  02/04/2018 5.1 3.5 - 5.2 mmol/L Final         Passed - Patient is not pregnant      Passed - Last BP in normal range    BP Readings from Last 1 Encounters:  02/08/18 103/67         Passed - Valid encounter within last 6 months    Recent Outpatient Visits          4 months ago Routine general medical examination at a health care facility   [facility], [place] P, DO   6 months ago Chest pain, unspecified type   [place], Megan P, DO   7 months ago Fatigue, unspecified type   [place], Megan P, DO   10 months ago Essential hypertension   [place], MD   11 months ago Acute lower UTI   [facility], Lilia Argue, [place]      Future Appointments            In 1 month Criss[place], Jeannette How, MD [place], [facility]   In 7 months  [employer] [employer], [place]   In 7 months Crissman, Jeannette How, MD [facility], [place]

## 2018-08-16 ENCOUNTER — Ambulatory Visit (INDEPENDENT_AMBULATORY_CARE_PROVIDER_SITE_OTHER): Payer: Medicare Other | Admitting: Family Medicine

## 2018-08-16 ENCOUNTER — Encounter: Payer: Self-pay | Admitting: Family Medicine

## 2018-08-16 VITALS — BP 121/71 | HR 58 | Temp 98.8°F | Wt 185.2 lb

## 2018-08-16 DIAGNOSIS — I10 Essential (primary) hypertension: Secondary | ICD-10-CM | POA: Diagnosis not present

## 2018-08-16 DIAGNOSIS — E7800 Pure hypercholesterolemia, unspecified: Secondary | ICD-10-CM | POA: Diagnosis not present

## 2018-08-16 DIAGNOSIS — F314 Bipolar disorder, current episode depressed, severe, without psychotic features: Secondary | ICD-10-CM | POA: Diagnosis not present

## 2018-08-16 NOTE — Progress Notes (Signed)
BP 121/71   Pulse (!) 58   Temp 98.8 F (37.1 C) (Oral)   Wt 185 lb 3.2 oz (84 kg)   SpO2 97%   BMI 31.11 kg/m    Subjective:    Patient ID: Elizabeth Malone, female    DOB: 03-23-1951, 67 y.o.   MRN: [id number]  HPI: Elizabeth Malone is a 67 y.o. female  Chief Complaint  Patient presents with  . Manic Behavior  . Hypertension   Patient with a great deal of stress with husband back on drugs again.  Patient's depressions worsening has been working with psychiatry but does not have an appointment until January. To help get out of her depression she increased her lithium to 3 a day from 1 a day.  She took this for 2 days.  Has not taken for several days. Otherwise blood pressure doing stable. Cholesterol and other medications stable.  Relevant past medical, surgical, family and social history reviewed and updated as indicated. Interim medical history since our last visit reviewed. Allergies and medications reviewed and updated.  Review of Systems  Constitutional: Negative.   Respiratory: Negative.   Cardiovascular: Negative.     Per HPI unless specifically indicated above     Objective:    BP 121/71   Pulse (!) 58   Temp 98.8 F (37.1 C) (Oral)   Wt 185 lb 3.2 oz (84 kg)   SpO2 97%   BMI 31.11 kg/m   Wt Readings from Last 3 Encounters:  08/16/18 185 lb 3.2 oz (84 kg)  02/08/18 176 lb 9 oz (80.1 kg)  02/04/18 178 lb 1.6 oz (80.8 kg)    Physical Exam  Constitutional: She is oriented to person, place, and time. She appears well-developed and well-nourished.  HENT:  Head: Normocephalic and atraumatic.  Eyes: Conjunctivae and EOM are normal.  Neck: Normal range of motion.  Cardiovascular: Normal rate, regular rhythm and normal heart sounds.  Pulmonary/Chest: Effort normal and breath sounds normal.  Musculoskeletal: Normal range of motion.  Neurological: She is alert and oriented to person, place, and time.  Skin: No erythema.  Psychiatric: She has a normal mood and  affect. Her behavior is normal. Judgment and thought content normal.    Results for orders placed or performed in visit on 03/03/18  ECHOCARDIOGRAM STRESS TEST  Result Value Ref Range   Rest HR 78 bpm   Rest BP 136/88 mmHg   RPE 15    Exercise duration (sec) 0 sec   Percent HR 112 %   Exercise duration (min) 7 min   Estimated workload 8.5 METS   Peak HR 173 bpm   Peak BP 203/104 mmHg   MPHR 154 bpm      Assessment & Plan:   Problem List Items Addressed This Visit      Cardiovascular and Mediastinum   Essential hypertension    The current medical regimen is effective;  continue present plan and medications. a        Other   Bipolar disorder ([place]) - Primary    Discussed bipolar and depression discussed medication to help with depression and to not adjust lithium levels.  Will check lithium and BMP today. Reviewed medicines patient may increase Seroquel when in depression spells and also will discuss with her psychiatrist.      Relevant Orders   Lithium level   Basic metabolic panel   Hypercholesteremia    The current medical regimen is effective;  continue present plan and medications.  Follow up plan: Return in about 6 months (around 02/15/2019) for Physical Exam.

## 2018-08-16 NOTE — Assessment & Plan Note (Signed)
Discussed bipolar and depression discussed medication to help with depression and to not adjust lithium levels.  Will check lithium and BMP today. Reviewed medicines patient may increase Seroquel when in depression spells and also will discuss with her psychiatrist.

## 2018-08-16 NOTE — Assessment & Plan Note (Signed)
The current medical regimen is effective;  continue present plan and medications. a 

## 2018-08-16 NOTE — Assessment & Plan Note (Signed)
The current medical regimen is effective;  continue present plan and medications.  

## 2018-08-17 ENCOUNTER — Encounter: Payer: Self-pay | Admitting: Family Medicine

## 2018-08-17 LAB — LITHIUM LEVEL: Lithium Lvl: 0.4 mmol/L — ABNORMAL LOW (ref 0.6–1.2)

## 2018-08-17 LAB — BASIC METABOLIC PANEL
BUN / CREAT RATIO: 21 (ref 12–28)
BUN: 19 mg/dL (ref 8–27)
CO2: 23 mmol/L (ref 20–29)
CREATININE: 0.91 mg/dL (ref 0.57–1.00)
Calcium: 10.2 mg/dL (ref 8.7–10.3)
Chloride: 106 mmol/L (ref 96–106)
GFR calc Af Amer: 76 mL/min/{1.73_m2} (ref >59)
GFR, EST NON AFRICAN AMERICAN: 65 mL/min/{1.73_m2} (ref >59)
Glucose: 107 mg/dL — ABNORMAL HIGH (ref 65–99)
Potassium: 4.9 mmol/L (ref 3.5–5.2)
Sodium: 144 mmol/L (ref 134–144)

## 2018-09-19 ENCOUNTER — Telehealth: Payer: Self-pay | Admitting: Family Medicine

## 2018-09-19 ENCOUNTER — Encounter: Payer: Self-pay | Admitting: Psychiatry

## 2018-09-19 ENCOUNTER — Ambulatory Visit (INDEPENDENT_AMBULATORY_CARE_PROVIDER_SITE_OTHER): Payer: Medicare Other | Admitting: Psychiatry

## 2018-09-19 ENCOUNTER — Other Ambulatory Visit: Payer: Self-pay | Admitting: Family Medicine

## 2018-09-19 ENCOUNTER — Other Ambulatory Visit: Payer: Self-pay

## 2018-09-19 ENCOUNTER — Telehealth: Payer: Self-pay | Admitting: Gastroenterology

## 2018-09-19 VITALS — BP 127/84 | HR 67 | Temp 97.9°F

## 2018-09-19 DIAGNOSIS — F3160 Bipolar disorder, current episode mixed, unspecified: Secondary | ICD-10-CM | POA: Diagnosis not present

## 2018-09-19 DIAGNOSIS — Z1239 Encounter for other screening for malignant neoplasm of breast: Secondary | ICD-10-CM

## 2018-09-19 MED ORDER — TOPIRAMATE 100 MG PO TABS: 150.0000 mg | ORAL_TABLET | Freq: Every day | ORAL | 1 refills | Status: DC

## 2018-09-19 MED ORDER — QUETIAPINE FUMARATE 50 MG PO TABS: 50.0000 mg | ORAL_TABLET | Freq: Every day | ORAL | 1 refills | Status: DC

## 2018-09-19 MED ORDER — LITHIUM CARBONATE 300 MG PO TABS: 600.0000 mg | ORAL_TABLET | Freq: Every day | ORAL | 1 refills | Status: DC

## 2018-09-19 MED ORDER — GABAPENTIN 300 MG PO CAPS: 300.0000 mg | ORAL_CAPSULE | Freq: Three times a day (TID) | ORAL | 2 refills | Status: DC

## 2018-09-19 MED ORDER — VENLAFAXINE HCL ER 150 MG PO CP24: 150.0000 mg | ORAL_CAPSULE | Freq: Every day | ORAL | 1 refills | Status: DC

## 2018-09-19 MED ORDER — HYDROXYZINE PAMOATE 25 MG PO CAPS: 25.0000 mg | ORAL_CAPSULE | Freq: Three times a day (TID) | ORAL | 1 refills | Status: DC

## 2018-09-19 MED ORDER — BUSPIRONE HCL 15 MG PO TABS: 7.5000 mg | ORAL_TABLET | Freq: Three times a day (TID) | ORAL | 1 refills | Status: DC

## 2018-09-19 MED ORDER — TRAZODONE HCL 100 MG PO TABS: 100.0000 mg | ORAL_TABLET | Freq: Every day | ORAL | 1 refills | Status: DC

## 2018-09-19 NOTE — Progress Notes (Signed)
[facility] MD Progress Note  09/19/2018 9:43 AM Elizabeth Malone  MRN:  [id number]   Subjective:    Patient is a 68 year-old married female with history of bipolar disorder who presented for follow-up appointment. Patient reported that she became depressed after her last appointment and was in depressive episode for almost 1 week.  She called her crisis line and spoke to the nurse.  She reported that she did not call for an earlier appointment as she was not sure if she can come back for an earlier appointment.  She also went to see her primary care physician after she increased her lithium dose to 900 mg.  She reported that he did not appreciate that she has increased the lithium and asked her that she should have gone higher on the dose of the Effexor. Patient reported that she is currently feeling hyper and has erratic sleep.  Her mood swings continues and she is feeling more irritable with crying episodes.  She has been feeling insecure and has been lashing out.  She has been spending more money and feels anxious.  Patient reported that she has been having headache for the past 2 days and has been taking Tylenol.  Patient reported that she also has chronic diarrhea and has been losing stool sometimes while asleep.   She reported that she was diagnosed with precancerous cells during her colonoscopy last year.  We discussed about follow-up with the GI regarding her chronic diarrhea and she agreed with the plan.  She reported that she wants her medications to be adjusted.  She appeared anxious and apprehensive during the interview.  She is compliant with her medications.  She currently denied having any suicidal homicidal ideations or plans.         She is able to contract for safety at this time.        Principal Problem:  Diagnosis:   Patient Active Problem List   Diagnosis Date Noted  . 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 Rectal polyp [K62.1]   . Overactive bladder [N32.81] 08/17/2017   . Advanced care planning/counseling discussion [Z71.89] 02/02/2017  . Bipolar disorder ([place]) [F31.9] 08/05/2015  . Essential hypertension [I10] 08/05/2015  . Hypercholesteremia [E78.00] 08/05/2015    Past Medical History:  Past Medical History:  Diagnosis Date  . Anxiety   . Bipolar disorder ([place])   . CKD (chronic kidney disease) stage 3, GFR 30-59 ml/min (HCC)   . Complication of anesthesia   . Depression   . GERD (gastroesophageal reflux disease)    occasionally has to use tums  . Headache   . History of diverticulosis   . Hyperlipidemia   . Overactive bladder   . PONV (postoperative nausea and vomiting)   . Substance abuse ([place])    alcohol    Past Surgical History:  Procedure Laterality Date  . COLONOSCOPY WITH PROPOFOL N/A 10/07/2017   Procedure: COLONOSCOPY WITH PROPOFOL;  Surgeon: [name], MD;  Location: [facility];  Service: Endoscopy;  Laterality: N/A;  . FOOT SURGERY Bilateral   . KNEE SURGERY Left   . POLYPECTOMY  10/07/2017   Procedure: POLYPECTOMY;  Surgeon: [name], MD;  Location: [facility];  Service: Endoscopy;;  . TONSILLECTOMY AND ADENOIDECTOMY     Family History:  Family History  Problem Relation Age of Onset  . Alzheimer's disease Mother   . Bipolar disorder Mother   . Depression Mother   . Heart attack Father   . Diabetes  Brother   . Obesity Brother   . Depression Maternal Grandfather   . Depression Daughter   . Stroke Maternal Grandmother   . Stroke Paternal Grandmother   . Heart attack Paternal Grandfather   . Breast cancer Neg Hx    Family Psychiatric  History: Family history positive for multiple people with depression and a few people who have committed suicide. Social History:  Social History   Substance and Sexual Activity  Alcohol Use Yes  . Alcohol/week: 3.0 standard drinks  . Types: 3 Shots of liquor per week   Comment: occassional     Social History   Substance and Sexual Activity  Drug Use No     Social History   Socioeconomic History  . Marital status: Married    Spouse name: Not on file  . Number of children: 2  . Years of education: Not on file  . Highest education level: Some college, no degree  Occupational History  . 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 Financial resource strain: Not hard at all  . Food insecurity:    Worry: Never true    Inability: Never true  . Transportation needs:    Medical: No    Non-medical: No  Tobacco Use  . Smoking status: Former Smoker    Types: Cigarettes    Start date: 07/09/1967    Last attempt to quit: 07/08/1985    Years since quitting: 33.2  . Smokeless tobacco: Never Used  Substance and Sexual Activity  . Alcohol use: Yes    Alcohol/week: 3.0 standard drinks    Types: 3 Shots of liquor per week    Comment: occassional  . Drug use: No  . Sexual activity: Not Currently  Lifestyle  . Physical activity:    Days per week: 0 days    Minutes per session: 0 min  . Stress: Very much  Relationships  . Social connections:    Talks on phone: Once a week    Gets together: Once a week    Attends religious service: More than 4 times per year    Active member of club or organization: Yes    Attends meetings of clubs or organizations: More than 4 times per year    Relationship status: Married  Other Topics Concern  . Not on file  Social History Narrative  . Not on file   Additional Social History:                        Sleep: Fair  Appetite:  Fair  Current Medications: Current Outpatient Medications  Medication Sig Dispense Refill  . atorvastatin (LIPITOR) 20 MG tablet Take 1 tablet (20 mg total) by mouth daily. 90 tablet 4  . augmented betamethasone dipropionate (DIPROLENE AF) 0.05 % cream Apply topically 2 (two) times daily. 30 g 0  . benazepril (LOTENSIN) 5 MG tablet TAKE 1 TABLET BY MOUTH  DAILY 90 tablet 1  . busPIRone (BUSPAR) 15 MG tablet Take 0.5 tablets (7.5 mg total) by mouth 3 (three) times  daily. Pt has supply 270 tablet 1  . Calcium Citrate-Vitamin D (CALCIUM + D PO) Take 1 tablet by mouth 2 (two) times daily.     . fluorouracil (EFUDEX) 5 % cream Apply topically 2 (two) times daily. Plus calsipotriene Use 4-5 days    . fluticasone (CUTIVATE) 0.005 % ointment Apply 1 application topically 2 (two) times daily.    Marland Kitchen gabapentin (NEURONTIN) 300 MG capsule Take 1  capsule (300 mg total) by mouth 3 (three) times daily. 270 capsule 2  . hydrocortisone valerate ointment (WEST-CORT) 0.2 % APP AA BID UNTIL IMPROVED  0  . lithium 300 MG tablet Take 2 tablets (600 mg total) by mouth at bedtime. Pt has supply 90 tablet 1  . metoprolol succinate (TOPROL-XL) 25 MG 24 hr tablet Take 1 tablet (25 mg total) by mouth daily. 90 tablet 4  . Multiple Vitamin (MULTIVITAMIN) tablet Take 1 tablet by mouth daily.    . mupirocin cream (BACTROBAN) 2 % Apply 1 application topically 2 (two) times daily.    . Omega-3 Fatty Acids (FISH OIL) 1000 MG CAPS Take 1,000 mg by mouth 2 (two) times daily.     Marland Kitchen oxybutynin (DITROPAN-XL) 10 MG 24 hr tablet Take 1 tablet (10 mg total) by mouth at bedtime. 90 tablet 3  . QUEtiapine (SEROQUEL) 50 MG tablet Take 1 tablet (50 mg total) by mouth at bedtime. 90 tablet 1  . topiramate (TOPAMAX) 100 MG tablet Take 1.5 tablets (150 mg total) by mouth daily. 135 tablet 1  . traZODone (DESYREL) 100 MG tablet Take 1 tablet (100 mg total) by mouth at bedtime. 90 tablet 1  . Triamcinolone Acetonide ([employer] NASAL ALLERGY SPRAY NA) Place into the nose.    . triamcinolone cream (KENALOG) 0.1 % APPLY TO RASH TWICE DAILY AS NEEDED  0  . venlafaxine XR (EFFEXOR-XR) 150 MG 24 hr capsule Take 1 capsule (150 mg total) by mouth daily with breakfast. 90 capsule 1  . hydrOXYzine (VISTARIL) 25 MG capsule Take 1 capsule (25 mg total) by mouth 3 (three) times daily. 90 capsule 1   No current facility-administered medications for this visit.     Lab Results: No results found for this or any previous  visit (from the past 48 hour(s)).  Physical Findings: AIMS:  , ,  ,  ,    CIWA:    COWS:     Musculoskeletal: Strength & Muscle Tone: within normal limits Gait & Station: normal Patient leans: N/A  Psychiatric Specialty Exam: Review of Systems  Psychiatric/Behavioral: Positive for depression. The patient is nervous/anxious.     Blood pressure 127/84, pulse 67, temperature 97.9 F (36.6 C), temperature source Oral.There is no height or weight on file to calculate BMI.  General Appearance: Casual  Eye Contact::  Good  Speech:  Clear and Coherent and Normal Rate  Volume:  Normal  Mood:  Anxious  Affect:  Congruent  Thought Process:  Coherent and Goal Directed  Orientation:  Full (Time, Place, and Person)  Thought Content:  WDL  Suicidal Thoughts:  No  Homicidal Thoughts:  No  Memory:  Immediate;   Good Recent;   Good Remote;   Good  Judgement:  Fair  Insight:  Good  Psychomotor Activity:  Normal  Concentration:  Fair  Recall:  [employer] of Knowledge:Fair  Language: Fair  Akathisia:  No  Handed:  Right  AIMS (if indicated):     Assets:  Communication Skills Desire for Improvement Financial Resources/Insurance Housing Intimacy Leisure Time [place] Talents/Skills Transportation  ADL's:  Intact  Cognition: WNL  Sleep:      Treatment Plan Summary:  Discussed with patient at length about the medications treatment risks benefits and alternatives  Mood swings  She will continue Seroquel 50 mg qhs  I will increase the dose of lithium 600 mg at bedtime. I will start her on hydroxyzine 25 mg p.o. 3 times daily  PRN. Will continue on gabapentin 300 mg by mouth TID  a day-  Buspar 15 mg TID   Effexor XR 150  mg daily at this time. Trazodone 100 mg by mouth daily at bedtime when necessary for insomnia. Continue Topamax 150 mg daily.    She will follow-up in 2 weeks or earlier depending on her symptoms   More than 50% of the  time spent in psychoeducation, counseling and coordination of care.    This note was generated in part or whole with voice recognition software. Voice regonition is usually quite accurate but there are transcription errors that can and very often do occur. I apologize for any typographical errors that were not detected and corrected.     Rainey Pines, MD  09/19/2018, 9:43 AM

## 2018-09-19 NOTE — Telephone Encounter (Signed)
Patient called & states she has diarrhea about 3-4 times a week and has to keep something under her at night.She stated she mentioned this to him when she had her colonoscopy 10-07-2017.Do you want to talk with her or can I just schedule her an office appointment ?

## 2018-09-19 NOTE — Telephone Encounter (Signed)
Elizabeth Malone presented in person to see about getting referral for mammogram last 10/26/2017

## 2018-09-19 NOTE — Telephone Encounter (Signed)
Called and left detailed message with information on patients vm. DPR was checked.   

## 2018-09-19 NOTE — Telephone Encounter (Signed)
Office appt please. She hasn't seen [place] at all except for a colonoscopy last January. Thanks!

## 2018-09-19 NOTE — Telephone Encounter (Signed)
She has an order in for a mammogram, she just needs to call to set it up.   [facility] at [facility]  Address: [address] [place], [place], [place] [address]  Phone: [phone]

## 2018-09-20 NOTE — Telephone Encounter (Signed)
Patient has been made an appointment with Dr Allen Norris in [facility] for Thursday.

## 2018-09-22 ENCOUNTER — Other Ambulatory Visit: Payer: Self-pay

## 2018-09-22 ENCOUNTER — Ambulatory Visit: Payer: Medicare Other | Admitting: Gastroenterology

## 2018-09-22 ENCOUNTER — Encounter: Payer: Self-pay | Admitting: Gastroenterology

## 2018-09-22 VITALS — BP 129/71 | HR 67 | Ht 64.0 in | Wt 187.8 lb

## 2018-09-22 DIAGNOSIS — K591 Functional diarrhea: Secondary | ICD-10-CM

## 2018-09-22 MED ORDER — DIPHENOXYLATE-ATROPINE 2.5-0.025 MG PO TABS: 1.0000 | ORAL_TABLET | Freq: Four times a day (QID) | ORAL | 0 refills | Status: DC | PRN
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
## 2018-09-28 ENCOUNTER — Telehealth: Payer: Self-pay

## 2018-09-28 NOTE — Telephone Encounter (Signed)
approval notice for hydroxyzpam cap 25mg  was approved through 09-14-2019

## 2018-10-03 ENCOUNTER — Ambulatory Visit: Payer: Medicare Other | Admitting: Psychiatry

## 2018-10-20 ENCOUNTER — Other Ambulatory Visit: Payer: Self-pay

## 2018-10-20 ENCOUNTER — Telehealth: Payer: Self-pay | Admitting: Gastroenterology

## 2018-10-20 MED ORDER — DIPHENOXYLATE-ATROPINE 2.5-0.025 MG PO TABS: 1.0000 | ORAL_TABLET | Freq: Four times a day (QID) | ORAL | 2 refills | Status: DC | PRN

## 2018-10-20 NOTE — Telephone Encounter (Signed)
Pt notified Lomotil was called into [facility] per her request.

## 2018-10-20 NOTE — Telephone Encounter (Signed)
Pt is calling to let Ginger know she cancelled the [facility] order and would like the rx called in to [facility] in [place]. See prior message

## 2018-10-20 NOTE — Telephone Encounter (Signed)
Pt is calling she has been notified by [facility] that they had send a request for rx Biphenoxylate/Atrotine 2mg  1 tablet 4 times a day but they have not heard from our office and patient was told to call us.

## 2018-10-20 NOTE — Telephone Encounter (Signed)
Rx for Lomotil called into [facility] for a 90 day supply. Pt notified it may take up to 24 hours for [facility]'s rx to be cancelled and updated in the system.

## 2018-10-24 ENCOUNTER — Other Ambulatory Visit: Payer: Self-pay

## 2018-10-24 ENCOUNTER — Ambulatory Visit: Payer: Medicare Other | Admitting: Psychiatry

## 2018-10-24 ENCOUNTER — Encounter: Payer: Self-pay | Admitting: Psychiatry

## 2018-10-24 VITALS — BP 115/72 | HR 66 | Temp 97.7°F | Wt 189.2 lb

## 2018-10-24 DIAGNOSIS — F3160 Bipolar disorder, current episode mixed, unspecified: Secondary | ICD-10-CM

## 2018-10-24 DIAGNOSIS — F070 Personality change due to known physiological condition: Secondary | ICD-10-CM

## 2018-10-24 MED ORDER — LITHIUM CARBONATE 300 MG PO TABS: 600.0000 mg | ORAL_TABLET | Freq: Every day | ORAL | 1 refills | Status: DC

## 2018-10-24 MED ORDER — BUSPIRONE HCL 15 MG PO TABS: 15.0000 mg | ORAL_TABLET | Freq: Two times a day (BID) | ORAL | 1 refills | Status: DC

## 2018-10-24 MED ORDER — HYDROXYZINE PAMOATE 25 MG PO CAPS: 25.0000 mg | ORAL_CAPSULE | Freq: Two times a day (BID) | ORAL | 1 refills | Status: DC | PRN

## 2018-10-24 NOTE — Progress Notes (Signed)
[facility] MD Progress Note  10/24/2018 9:10 AM Elizabeth Malone  MRN:  [id number]   Subjective:    Patient is a 68 year-old married female with history of bipolar disorder who presented for follow-up appointment. Patient reported that she has started feeling better since she was started on lithium.  She reported that she has noticed improvement in her symptoms.  Patient reported that she does not feel depressed.  Her itching has also improved since she was started on the hydroxyzine.  He reported that she went to the dermatologist and the primary care physician in the past but they could not determine the reason for her itching.  She stated that she has felt much improved and she takes hydroxyzine only twice a day.  She feels that it has been very calming for her.  She appeared much alert and oriented during the interview.  She reported that she is involved in her activities at the church and has been feeling the poor.  She reported that she sleeps well at night.  She does not have any side effects of the medications.  We discussed about her medications in detail.  She reported that she does not feel that she is in crisis and her husband is also noticing improvement in her symptoms.      She currently denied having any suicidal homicidal ideations or plans.    She is able to contract for safety at this time.        Principal Problem:  Diagnosis:   Patient Active Problem List   Diagnosis Date Noted  . 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 Rectal polyp [K62.1]   . Overactive bladder [N32.81] 08/17/2017  . Advanced care planning/counseling discussion [Z71.89] 02/02/2017  . Bipolar disorder ([place]) [F31.9] 08/05/2015  . Essential hypertension [I10] 08/05/2015  . Hypercholesteremia [E78.00] 08/05/2015    Past Medical History:  Past Medical History:  Diagnosis Date  . Anxiety   . Bipolar disorder ([place])   . CKD (chronic kidney disease) stage 3, GFR 30-59 ml/min (HCC)   . Complication of  anesthesia   . Depression   . GERD (gastroesophageal reflux disease)    occasionally has to use tums  . Headache   . History of diverticulosis   . Hyperlipidemia   . Overactive bladder   . PONV (postoperative nausea and vomiting)   . Substance abuse ([place])    alcohol    Past Surgical History:  Procedure Laterality Date  . COLONOSCOPY WITH PROPOFOL N/A 10/07/2017   Procedure: COLONOSCOPY WITH PROPOFOL;  Surgeon: [name], MD;  Location: [place];  Service: Endoscopy;  Laterality: N/A;  . FOOT SURGERY Bilateral   . KNEE SURGERY Left   . POLYPECTOMY  10/07/2017   Procedure: POLYPECTOMY;  Surgeon: [name], MD;  Location: [place];  Service: Endoscopy;;  . TONSILLECTOMY AND ADENOIDECTOMY     Family History:  Family History  Problem Relation Age of Onset  . Alzheimer's disease Mother   . Bipolar disorder Mother   . Depression Mother   . Heart attack Father   . Diabetes Brother   . Obesity Brother   . Depression Maternal Grandfather   . Depression Daughter   . Stroke Maternal Grandmother   . Stroke Paternal Grandmother   . Heart attack Paternal Grandfather   . Breast cancer Neg Hx    Family Psychiatric  History: Family history positive for multiple people with depression and a few people who have committed suicide. Social  History:  Social History   Substance and Sexual Activity  Alcohol Use Yes  . Alcohol/week: 3.0 standard drinks  . Types: 3 Shots of liquor per week   Comment: occassional     Social History   Substance and Sexual Activity  Drug Use No    Social History   Socioeconomic History  . Marital status: Married    Spouse name: Not on file  . Number of children: 2  . Years of education: Not on file  . Highest education level: Some college, no degree  Occupational History  . 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 Financial resource strain: Not hard at all  . Food insecurity:    Worry: Never true    Inability: Never true   . Transportation needs:    Medical: No    Non-medical: No  Tobacco Use  . Smoking status: Former Smoker    Types: Cigarettes    Start date: 07/09/1967    Last attempt to quit: 07/08/1985    Years since quitting: 33.3  . Smokeless tobacco: Never Used  Substance and Sexual Activity  . Alcohol use: Yes    Alcohol/week: 3.0 standard drinks    Types: 3 Shots of liquor per week    Comment: occassional  . Drug use: No  . Sexual activity: Not Currently  Lifestyle  . Physical activity:    Days per week: 0 days    Minutes per session: 0 min  . Stress: Very much  Relationships  . Social connections:    Talks on phone: Once a week    Gets together: Once a week    Attends religious service: More than 4 times per year    Active member of club or organization: Yes    Attends meetings of clubs or organizations: More than 4 times per year    Relationship status: Married  Other Topics Concern  . Not on file  Social History Narrative  . Not on file   Additional Social History:                        Sleep: Fair  Appetite:  Fair  Current Medications: Current Outpatient Medications  Medication Sig Dispense Refill  . atorvastatin (LIPITOR) 20 MG tablet Take 1 tablet (20 mg total) by mouth daily. 90 tablet 4  . benazepril (LOTENSIN) 5 MG tablet TAKE 1 TABLET BY MOUTH  DAILY 90 tablet 1  . busPIRone (BUSPAR) 15 MG tablet Take 0.5 tablets (7.5 mg total) by mouth 3 (three) times daily. Pt has supply 270 tablet 1  . Calcium Citrate-Vitamin D (CALCIUM + D PO) Take 1 tablet by mouth 2 (two) times daily.     . diphenoxylate-atropine (LOMOTIL) 2.5-0.025 MG tablet Take 1 tablet by mouth 4 (four) times daily as needed for diarrhea or loose stools. 360 tablet 2  . gabapentin (NEURONTIN) 300 MG capsule Take 1 capsule (300 mg total) by mouth 3 (three) times daily. 270 capsule 2  . hydrOXYzine (VISTARIL) 25 MG capsule Take 1 capsule (25 mg total) by mouth 3 (three) times daily. 90 capsule 1   . lithium 300 MG tablet Take 2 tablets (600 mg total) by mouth at bedtime. Pt has supply 90 tablet 1  . metoprolol succinate (TOPROL-XL) 25 MG 24 hr tablet Take 1 tablet (25 mg total) by mouth daily. 90 tablet 4  . Multiple Vitamin (MULTIVITAMIN) tablet Take 1 tablet by mouth daily.    . Omega-3 Fatty  Acids (FISH OIL) 1000 MG CAPS Take 1,000 mg by mouth 2 (two) times daily.     Marland Kitchen oxybutynin (DITROPAN-XL) 10 MG 24 hr tablet Take 1 tablet (10 mg total) by mouth at bedtime. 90 tablet 3  . QUEtiapine (SEROQUEL) 50 MG tablet Take 1 tablet (50 mg total) by mouth at bedtime. 90 tablet 1  . topiramate (TOPAMAX) 100 MG tablet Take 1.5 tablets (150 mg total) by mouth daily. 135 tablet 1  . traZODone (DESYREL) 100 MG tablet Take 1 tablet (100 mg total) by mouth at bedtime. 90 tablet 1  . Triamcinolone Acetonide ([employer] NASAL ALLERGY SPRAY NA) Place into the nose.    . venlafaxine XR (EFFEXOR-XR) 150 MG 24 hr capsule Take 1 capsule (150 mg total) by mouth daily with breakfast. 90 capsule 1   No current facility-administered medications for this visit.     Lab Results: No results found for this or any previous visit (from the past 48 hour(s)).  Physical Findings: AIMS:  , ,  ,  ,    CIWA:    COWS:     Musculoskeletal: Strength & Muscle Tone: within normal limits Gait & Station: normal Patient leans: N/A  Psychiatric Specialty Exam: Review of Systems  Psychiatric/Behavioral: Positive for depression. The patient is nervous/anxious.     Blood pressure 115/72, pulse 66, temperature 97.7 F (36.5 C), temperature source Oral, weight 189 lb 3.2 oz (85.8 kg).Body mass index is 32.48 kg/m.  General Appearance: Casual  Eye Contact::  Good  Speech:  Clear and Coherent and Normal Rate  Volume:  Normal  Mood:  Anxious  Affect:  Congruent  Thought Process:  Coherent and Goal Directed  Orientation:  Full (Time, Place, and Person)  Thought Content:  WDL  Suicidal Thoughts:  No  Homicidal Thoughts:  No   Memory:  Immediate;   Good Recent;   Good Remote;   Good  Judgement:  Fair  Insight:  Good  Psychomotor Activity:  Normal  Concentration:  Fair  Recall:  [employer] of Knowledge:Fair  Language: Fair  Akathisia:  No  Handed:  Right  AIMS (if indicated):     Assets:  Communication Skills Desire for Improvement Financial Resources/Insurance Housing Intimacy Leisure Time [place] Talents/Skills Transportation  ADL's:  Intact  Cognition: WNL  Sleep:      Treatment Plan Summary:  Discussed with patient at length about the medications treatment risks benefits and alternatives  Mood swings  She will continue Seroquel 50 mg qhs  lithium 600 mg at bedtime.  hydroxyzine 25 mg p.o. 2 times daily PRN. Will continue on gabapentin 300 mg by mouth TID  a day-  Buspar 15 mg bid    Effexor XR 150  mg daily at this time. Trazodone 100 mg by mouth daily at bedtime when necessary for insomnia. Continue Topamax 150 mg daily.    She will follow-up in 2 months or earlier depending on her symptoms   More than 50% of the time spent in psychoeducation, counseling and coordination of care.    This note was generated in part or whole with voice recognition software. Voice regonition is usually quite accurate but there are transcription errors that can and very often do occur. I apologize for any typographical errors that were not detected and corrected.     Rainey Pines, MD  10/24/2018, 9:10 AM

## 2018-10-27 ENCOUNTER — Ambulatory Visit
Admission: RE | Admit: 2018-10-27 | Discharge: 2018-10-27 | Disposition: A | Discharge status: Home / Self Care | Payer: Medicare Other | Source: Ambulatory Visit | Attending: Family Medicine | Admitting: Family Medicine

## 2018-10-27 DIAGNOSIS — Z1231 Encounter for screening mammogram for malignant neoplasm of breast: Secondary | ICD-10-CM | POA: Diagnosis not present

## 2018-10-27 DIAGNOSIS — Z13820 Encounter for screening for osteoporosis: Secondary | ICD-10-CM | POA: Insufficient documentation

## 2018-10-27 DIAGNOSIS — Z1239 Encounter for other screening for malignant neoplasm of breast: Secondary | ICD-10-CM

## 2018-10-27 DIAGNOSIS — M85852 Other specified disorders of bone density and structure, left thigh: Secondary | ICD-10-CM | POA: Insufficient documentation

## 2018-11-01 ENCOUNTER — Encounter: Payer: Self-pay | Admitting: Emergency Medicine

## 2018-11-01 ENCOUNTER — Emergency Department
Admission: EM | Admit: 2018-11-01 | Discharge: 2018-11-01 | Disposition: A | Discharge status: Home / Self Care | Payer: Medicare Other | Attending: Emergency Medicine | Admitting: Emergency Medicine

## 2018-11-01 ENCOUNTER — Ambulatory Visit (INDEPENDENT_AMBULATORY_CARE_PROVIDER_SITE_OTHER): Payer: Medicare Other | Admitting: Family Medicine

## 2018-11-01 ENCOUNTER — Encounter: Payer: Self-pay | Admitting: Family Medicine

## 2018-11-01 ENCOUNTER — Other Ambulatory Visit: Payer: Self-pay

## 2018-11-01 VITALS — BP 134/80 | HR 59 | Temp 98.6°F | Ht 65.0 in | Wt 189.0 lb

## 2018-11-01 DIAGNOSIS — N183 Chronic kidney disease, stage 3 (moderate): Secondary | ICD-10-CM | POA: Insufficient documentation

## 2018-11-01 DIAGNOSIS — Z79899 Other long term (current) drug therapy: Secondary | ICD-10-CM | POA: Insufficient documentation

## 2018-11-01 DIAGNOSIS — F419 Anxiety disorder, unspecified: Secondary | ICD-10-CM | POA: Diagnosis not present

## 2018-11-01 DIAGNOSIS — I129 Hypertensive chronic kidney disease with stage 1 through stage 4 chronic kidney disease, or unspecified chronic kidney disease: Secondary | ICD-10-CM | POA: Diagnosis not present

## 2018-11-01 DIAGNOSIS — F319 Bipolar disorder, unspecified: Secondary | ICD-10-CM | POA: Diagnosis not present

## 2018-11-01 DIAGNOSIS — R251 Tremor, unspecified: Secondary | ICD-10-CM | POA: Diagnosis not present

## 2018-11-01 DIAGNOSIS — Z87891 Personal history of nicotine dependence: Secondary | ICD-10-CM | POA: Insufficient documentation

## 2018-11-01 LAB — COMPREHENSIVE METABOLIC PANEL
ALT: 17 U/L (ref 0–44)
AST: 16 U/L (ref 15–41)
Albumin: 4.5 g/dL (ref 3.5–5.0)
Alkaline Phosphatase: 66 U/L (ref 38–126)
Anion gap: 3 — ABNORMAL LOW (ref 5–15)
BUN: 17 mg/dL (ref 8–23)
CO2: 27 mmol/L (ref 22–32)
Calcium: 9.6 mg/dL (ref 8.9–10.3)
Chloride: 106 mmol/L (ref 98–111)
Creatinine, Ser: 0.8 mg/dL (ref 0.44–1.00)
GFR calc Af Amer: >60 mL/min (ref >60)
GFR calc non Af Amer: >60 mL/min (ref >60)
GLUCOSE: 110 mg/dL — AB (ref 70–99)
Potassium: 4.2 mmol/L (ref 3.5–5.1)
Sodium: 136 mmol/L (ref 135–145)
Total Bilirubin: 0.7 mg/dL (ref 0.3–1.2)
Total Protein: 7.6 g/dL (ref 6.5–8.1)

## 2018-11-01 LAB — CBC
HCT: 44.9 % (ref 36.0–46.0)
Hemoglobin: 14.5 g/dL (ref 12.0–15.0)
MCH: 31.4 pg (ref 26.0–34.0)
MCHC: 32.3 g/dL (ref 30.0–36.0)
MCV: 97.2 fL (ref 80.0–100.0)
Platelets: 229 10*3/uL (ref 150–400)
RBC: 4.62 MIL/uL (ref 3.87–5.11)
RDW: 12.2 % (ref 11.5–15.5)
WBC: 7 10*3/uL (ref 4.0–10.5)
nRBC: 0 % (ref 0.0–0.2)

## 2018-11-01 LAB — LITHIUM LEVEL: Lithium Lvl: 0.74 mmol/L (ref 0.60–1.20)

## 2018-11-01 NOTE — ED Notes (Signed)
First nurse note: pt sent from PCP office for dizziness, lightheadedness, and trembling. PCP reports pt's lithium dose recently doubled.

## 2018-11-01 NOTE — ED Provider Notes (Signed)
[facility] Emergency Department Provider Note   ____________________________________________    I have reviewed the triage vital signs and the nursing notes.   HISTORY  Chief Complaint Altered Mental Status and Tremors     HPI Elizabeth Malone is a 68 y.o. female who presents with complaints of tremors primarily.  Patient reports over the last 24 hours she has noticed tremors in her arms and legs.  She notes that her psychiatrist increased her dose of lithium several weeks ago and she wonders if this may be the cause.  She denies neuro deficits.  No headache.  No neck pain.  No other new medications.  She also feels that she has been more forgetful than usual.  Patient denies alcohol abuse   Past Medical History:  Diagnosis Date  . Anxiety   . Bipolar disorder ([place])   . CKD (chronic kidney disease) stage 3, GFR 30-59 ml/min (HCC)   . Complication of anesthesia   . Depression   . GERD (gastroesophageal reflux disease)    occasionally has to use tums  . Headache   . History of diverticulosis   . Hyperlipidemia   . Overactive bladder   . PONV (postoperative nausea and vomiting)   . Substance abuse ([place])    alcohol    Patient Active Problem List   Diagnosis Date Noted  . Benign neoplasm of ascending colon   . Rectal polyp   . Overactive bladder 08/17/2017  . Advanced care planning/counseling discussion 02/02/2017  . Bipolar disorder ([place]) 08/05/2015  . Essential hypertension 08/05/2015  . Hypercholesteremia 08/05/2015    Past Surgical History:  Procedure Laterality Date  . COLONOSCOPY WITH PROPOFOL N/A 10/07/2017   Procedure: COLONOSCOPY WITH PROPOFOL;  Surgeon: [name], MD;  Location: [place];  Service: Endoscopy;  Laterality: N/A;  . FOOT SURGERY Bilateral   . KNEE SURGERY Left   . POLYPECTOMY  10/07/2017   Procedure: POLYPECTOMY;  Surgeon: [name], MD;  Location: [place];  Service: Endoscopy;;  .  TONSILLECTOMY AND ADENOIDECTOMY      Prior to Admission medications   Medication Sig Start Date End Date Taking? Authorizing Provider  atorvastatin (LIPITOR) 20 MG tablet Take 1 tablet (20 mg total) by mouth daily. 02/08/18   Johnson, Megan P, DO  benazepril (LOTENSIN) 5 MG tablet TAKE 1 TABLET BY MOUTH  DAILY 06/24/18   Johnson, Megan P, DO  busPIRone (BUSPAR) 15 MG tablet Take 1 tablet (15 mg total) by mouth 2 (two) times daily. Pt has supply 10/24/18   Rainey Pines, MD  Calcium Citrate-Vitamin D (CALCIUM + D PO) Take 1 tablet by mouth 2 (two) times daily.     [provider]  diphenoxylate-atropine (LOMOTIL) 2.5-0.025 MG tablet Take 1 tablet by mouth 4 (four) times daily as needed for diarrhea or loose stools. Patient not taking: Reported on 11/01/2018 10/20/18   [name], MD  gabapentin (NEURONTIN) 300 MG capsule Take 1 capsule (300 mg total) by mouth 3 (three) times daily. 09/19/18   Rainey Pines, MD  hydrOXYzine (VISTARIL) 25 MG capsule Take 1 capsule (25 mg total) by mouth 2 (two) times daily as needed. Patient not taking: Reported on 11/01/2018 10/24/18   Rainey Pines, MD  lithium 300 MG tablet Take 2 tablets (600 mg total) by mouth at bedtime. 10/24/18   Rainey Pines, MD  metoprolol succinate (TOPROL-XL) 25 MG 24 hr tablet Take 1 tablet (25 mg total) by mouth daily. 02/08/18   Wynetta Emery,  Megan P, DO  Multiple Vitamin (MULTIVITAMIN) tablet Take 1 tablet by mouth daily.    [provider]  Omega-3 Fatty Acids (FISH OIL) 1000 MG CAPS Take 1,000 mg by mouth 2 (two) times daily.     [provider]  oxybutynin (DITROPAN-XL) 10 MG 24 hr tablet Take 1 tablet (10 mg total) by mouth at bedtime. 02/08/18   Johnson, Megan P, DO  QUEtiapine (SEROQUEL) 50 MG tablet Take 1 tablet (50 mg total) by mouth at bedtime. 09/19/18   Rainey Pines, MD  topiramate (TOPAMAX) 100 MG tablet Take 1.5 tablets (150 mg total) by mouth daily. 09/19/18   Rainey Pines, MD  traZODone (DESYREL) 100 MG tablet  Take 1 tablet (100 mg total) by mouth at bedtime. 09/19/18   Rainey Pines, MD  venlafaxine XR (EFFEXOR-XR) 150 MG 24 hr capsule Take 1 capsule (150 mg total) by mouth daily with breakfast. 09/19/18   Rainey Pines, MD     Allergies Ace inhibitors; Fluoxetine; Hydrocodone-chlorpheniramine; Paroxetine hcl; Penicillin g benzathine; Penicillin v potassium; Clindamycin hcl; and Lamotrigine  Family History  Problem Relation Age of Onset  . Alzheimer's disease Mother   . Bipolar disorder Mother   . Depression Mother   . Heart attack Father   . Diabetes Brother   . Obesity Brother   . Depression Maternal Grandfather   . Depression Daughter   . Stroke Maternal Grandmother   . Stroke Paternal Grandmother   . Heart attack Paternal Grandfather   . Breast cancer Neg Hx     Social History Social History   Tobacco Use  . Smoking status: Former Smoker    Types: Cigarettes    Start date: 07/09/1967    Last attempt to quit: 07/08/1985    Years since quitting: 33.3  . Smokeless tobacco: Never Used  Substance Use Topics  . Alcohol use: Yes    Alcohol/week: 3.0 standard drinks    Types: 3 Shots of liquor per week    Comment: occassional  . Drug use: No    Review of Systems  Constitutional: No fever/chills Eyes: No visual changes.  ENT: No sore throat. Cardiovascular: Denies chest pain. Respiratory: Denies shortness of breath. Gastrointestinal: No abdominal pain.   Genitourinary: Negative for dysuria. Musculoskeletal: Negative for back pain. Skin: Negative for rash. Neurological: Tremors as above   ____________________________________________   PHYSICAL EXAM:  VITAL SIGNS: ED Triage Vitals  Enc Vitals Group     BP 11/01/18 1305 (!) 148/90     Pulse Rate 11/01/18 1305 64     Resp 11/01/18 1305 20     Temp 11/01/18 1305 98.6 F (37 C)     Temp Source 11/01/18 1305 Oral     SpO2 11/01/18 1305 97 %     Weight 11/01/18 1306 85.7 kg (189 lb)     Height 11/01/18 1306 1.651 m (5'  5")     Head Circumference --      Peak Flow --      Pain Score 11/01/18 1314 0     Pain Loc --      Pain Edu? --      Excl. in [place]? --     Constitutional: Alert and oriented. No acute distress. Pleasant and interactive Eyes: Conjunctivae are normal.  PERRLA  Nose: No congestion/rhinnorhea. Mouth/Throat: Mucous membranes are moist.   Neck:  Painless ROM Cardiovascular: Normal rate, regular rhythm. Grossly normal heart sounds.  Good peripheral circulation. Respiratory: Normal respiratory effort.  No retractions. Lungs CTAB. Gastrointestinal:  Soft and nontender. No distention.  No CVA tenderness.  Musculoskeletal: No lower extremity tenderness nor edema.  Warm and well perfused, mild tremors noted bilateral hands and feet Neurologic:  Normal speech and language. No gross focal neurologic deficits are appreciated.  Skin:  Skin is warm, dry and intact. No rash noted. Psychiatric: Mood and affect are normal. Speech and behavior are normal.  ____________________________________________   LABS (all labs ordered are listed, but only abnormal results are displayed)  Labs Reviewed  COMPREHENSIVE METABOLIC PANEL - Abnormal; Notable for the following components:      Result Value   Glucose, Bld 110 (*)    Anion gap 3 (*)    All other components within normal limits  CBC  LITHIUM LEVEL  CBG MONITORING, ED   ____________________________________________  EKG  ED ECG REPORT I, Lavonia Drafts, the attending physician, personally viewed and interpreted this ECG.  Date: 11/01/2018  Rhythm: normal sinus rhythm QRS Axis: normal Intervals: normal ST/T Wave abnormalities: normal Narrative Interpretation: no evidence of acute ischemia  ____________________________________________  RADIOLOGY  None ____________________________________________   PROCEDURES  Procedure(s) performed: No  Procedures   Critical Care performed:  No ____________________________________________   INITIAL IMPRESSION / ASSESSMENT AND PLAN / ED COURSE  Pertinent labs & imaging results that were available during my care of the patient were reviewed by me and considered in my medical decision making (see chart for details).  Patient well-appearing in no acute distress lab work is unremarkable, lithium level is within normal range.  Neurologically the patient is intact, unclear cause of her tremors, she does deny alcohol abuse.  She does not have any tachycardia.  Will refer her to neurology for further work-up    ____________________________________________   FINAL CLINICAL IMPRESSION(S) / ED DIAGNOSES  Final diagnoses:  Tremors of nervous system        Note:  This document was prepared using Dragon voice recognition software and may include unintentional dictation errors.   Lavonia Drafts, MD 11/01/18 1840

## 2018-11-01 NOTE — ED Triage Notes (Signed)
Pt in via POV, reports noticing tremors since Sunday with symptoms progressing, into dizziness and intermittent confusion.  Pt states, "I dont know if my meds are off or its my thyroid or what."  Pt does report increase in Lithium dose 5 weeks ago.  Ambulatory to triage.  Vitals WDL.  NAD noted at this time.

## 2018-11-01 NOTE — Progress Notes (Signed)
BP 134/80   Pulse (!) 59   Temp 98.6 F (37 C) (Oral)   Ht 5\' 5"  (1.651 m)   Wt 189 lb (85.7 kg)   SpO2 97%   BMI 31.45 kg/m    Subjective:    Patient ID: Elizabeth Malone, female    DOB: 10-18-1950, 68 y.o.   MRN: [id number]  HPI: Elizabeth Malone is a 68 y.o. female  Chief Complaint  Patient presents with  . Dizziness    pt states she has been shaking, trembling and not able to think well, lightheadedness. first noticed last Sunday   Pt presented into the clinic as a walk in today c/o tremors, anxiousness, dizziness, memory issues the past 3 days continuously. Denies any trauma or inciting event, and states she can't seem to make the tremors stop no matter what she does. Only recent medication change was increasing her lithium dose from 300 mg to 600 mg. Notes she's had issues with her lithium levels in the past. Followed by Dr. Gretel Acre for these medications but states she only is in clinic 1 day per month so she could not see her. Denies CP, fevers, syncope, recent drug or alcohol use.   Relevant past medical, surgical, family and social history reviewed and updated as indicated. Interim medical history since our last visit reviewed. Allergies and medications reviewed and updated.  Review of Systems  Per HPI unless specifically indicated above     Objective:    BP 134/80   Pulse (!) 59   Temp 98.6 F (37 C) (Oral)   Ht 5\' 5"  (1.651 m)   Wt 189 lb (85.7 kg)   SpO2 97%   BMI 31.45 kg/m   Wt Readings from Last 3 Encounters:  11/01/18 189 lb (85.7 kg)  11/01/18 189 lb (85.7 kg)  09/22/18 187 lb 12.8 oz (85.2 kg)    Physical Exam Vitals signs and nursing note reviewed.  Constitutional:      Appearance: Normal appearance. She is not ill-appearing.  HENT:     Head: Atraumatic.  Eyes:     Extraocular Movements: Extraocular movements intact.     Conjunctiva/sclera: Conjunctivae normal.  Neck:     Musculoskeletal: Normal range of motion and neck supple.  Cardiovascular:      Rate and Rhythm: Normal rate and regular rhythm.     Heart sounds: Normal heart sounds.  Pulmonary:     Effort: Pulmonary effort is normal.     Breath sounds: Normal breath sounds.  Musculoskeletal: Normal range of motion.  Skin:    General: Skin is warm and dry.  Neurological:     Mental Status: She is alert and oriented to person, place, and time.     Comments: Intermittent irregular tremors diffusely across body  Psychiatric:        Thought Content: Thought content normal.        Judgment: Judgment normal.     Comments: Anxious, tearful, jittery     Results for orders placed or performed in visit on 08/16/18  Lithium level  Result Value Ref Range   Lithium Lvl 0.4 (L) 0.6 - 1.2 mmol/L  Basic metabolic panel  Result Value Ref Range   Glucose 107 (H) 65 - 99 mg/dL   BUN 19 8 - 27 mg/dL   Creatinine, Ser 0.91 0.57 - 1.00 mg/dL   GFR calc non Af Amer 65 >59 mL/min/1.73   GFR calc Af Amer 76 >59 mL/min/1.73   BUN/Creatinine Ratio 21 12 -  28   Sodium 144 134 - 144 mmol/L   Potassium 4.9 3.5 - 5.2 mmol/L   Chloride 106 96 - 106 mmol/L   CO2 23 20 - 29 mmol/L   Calcium 10.2 8.7 - 10.3 mg/dL      Assessment & Plan:   Problem List Items Addressed This Visit    None    Visit Diagnoses    Tremor    -  Primary   Unclear etiology, possibly prolonged anxiety episode vs lithium toxicity - unable to get stat labs so recommend ER for immediate eval.     Pt's husband will come to drive her to ER for immediate eval. [facility] ED called and informed of pt's sxs.    Greater than 25 min spent in direct patient care and coordination.   Follow up plan: Return if symptoms worsen or fail to improve.

## 2018-11-02 ENCOUNTER — Ambulatory Visit: Payer: Medicare Other | Admitting: Family Medicine

## 2018-11-21 ENCOUNTER — Other Ambulatory Visit: Payer: Self-pay | Admitting: Psychiatry

## 2018-11-24 NOTE — Telephone Encounter (Signed)
Looks like the rx was not printed or eprescribed to the [facility]. Dr. Gretel Acre patient

## 2018-11-25 NOTE — Telephone Encounter (Signed)
I am not sure what her dosage for buspar is ? Per Dr.Faheems notes - buspar 15 mg bid. However the medication list in the notes says a different dosage. Will have to clarify with patient or Dr.Faheem. I spoke to patient and she says she wants to change her dosage and will talk to Dr.Faheem directly.

## 2018-11-28 NOTE — Telephone Encounter (Signed)
pt called left message pt needs refills pt states she is not taking the hydroxyzine anymore and that she on the buspar 3 times a day. pt states she needs refills. this is a dr. Gretel Acre patient.

## 2018-12-12 ENCOUNTER — Other Ambulatory Visit: Payer: Self-pay | Admitting: Psychiatry

## 2018-12-14 NOTE — Telephone Encounter (Signed)
  Received a request for medication refill .  It looks as if Dr. Gretel Acre did not print or e-prescribed this medication.    busPIRone (BUSPAR) 15 MG tablet  Medication  Date: 10/24/2018 Department: [facility] Ordering/Authorizing: Rainey Pines, MD  Order Providers   Prescribing Provider Encounter Provider  Rainey Pines, MD Rainey Pines, MD  Outpatient Medication Detail    Disp Refills Start End   busPIRone (BUSPAR) 15 MG tablet 270 tablet 1 10/24/2018    Sig - Route: Take 1 tablet (15 mg total) by mouth 2 (two) times daily. Pt has supply - Oral   Class: No Print

## 2018-12-15 ENCOUNTER — Telehealth: Payer: Self-pay | Admitting: Psychiatry

## 2018-12-15 DIAGNOSIS — F419 Anxiety disorder, unspecified: Secondary | ICD-10-CM

## 2018-12-15 MED ORDER — VENLAFAXINE HCL ER 150 MG PO CP24: 150.0000 mg | ORAL_CAPSULE | Freq: Every day | ORAL | 0 refills | Status: DC

## 2018-12-15 MED ORDER — BUSPIRONE HCL 15 MG PO TABS: 15.0000 mg | ORAL_TABLET | Freq: Two times a day (BID) | ORAL | 0 refills | Status: DC

## 2018-12-15 NOTE — Telephone Encounter (Signed)
Sent Buspar ,effexor to pharmacy

## 2018-12-15 NOTE — Telephone Encounter (Signed)
dr. Gretel Acre did not send the rx for the buspar over to the pharmacy it has no print on the rx.

## 2018-12-19 ENCOUNTER — Ambulatory Visit: Payer: Medicare Other | Admitting: Psychiatry

## 2019-01-02 ENCOUNTER — Ambulatory Visit: Payer: Medicare Other | Admitting: Psychiatry

## 2019-01-22 ENCOUNTER — Other Ambulatory Visit: Payer: Self-pay | Admitting: Family Medicine

## 2019-01-22 ENCOUNTER — Other Ambulatory Visit: Payer: Self-pay | Admitting: Psychiatry

## 2019-01-22 DIAGNOSIS — I10 Essential (primary) hypertension: Secondary | ICD-10-CM

## 2019-01-25 NOTE — Telephone Encounter (Signed)
RECEIVED FAX THAT PT NEEDS REFILLS ON SEROQUEL AND DESYREL . [place]

## 2019-01-30 ENCOUNTER — Encounter: Payer: Self-pay | Admitting: Psychiatry

## 2019-01-30 ENCOUNTER — Other Ambulatory Visit: Payer: Self-pay

## 2019-01-30 ENCOUNTER — Ambulatory Visit (INDEPENDENT_AMBULATORY_CARE_PROVIDER_SITE_OTHER): Payer: Medicare Other | Admitting: Psychiatry

## 2019-01-30 DIAGNOSIS — F419 Anxiety disorder, unspecified: Secondary | ICD-10-CM | POA: Diagnosis not present

## 2019-01-30 DIAGNOSIS — F3160 Bipolar disorder, current episode mixed, unspecified: Secondary | ICD-10-CM

## 2019-01-30 MED ORDER — QUETIAPINE FUMARATE 50 MG PO TABS: 50.0000 mg | ORAL_TABLET | Freq: Every day | ORAL | 1 refills | Status: DC

## 2019-01-30 MED ORDER — BUSPIRONE HCL 15 MG PO TABS: 15.0000 mg | ORAL_TABLET | Freq: Three times a day (TID) | ORAL | 3 refills | Status: DC

## 2019-01-30 MED ORDER — VENLAFAXINE HCL ER 150 MG PO CP24: 150.0000 mg | ORAL_CAPSULE | Freq: Every day | ORAL | 1 refills | Status: DC

## 2019-01-30 MED ORDER — TRAZODONE HCL 100 MG PO TABS: 100.0000 mg | ORAL_TABLET | Freq: Every day | ORAL | 1 refills | Status: DC

## 2019-01-30 MED ORDER — LITHIUM CARBONATE 300 MG PO TABS: 600.0000 mg | ORAL_TABLET | Freq: Every day | ORAL | 1 refills | Status: DC

## 2019-01-30 MED ORDER — TOPIRAMATE 100 MG PO TABS: 150.0000 mg | ORAL_TABLET | Freq: Every day | ORAL | 1 refills | Status: DC

## 2019-01-30 MED ORDER — GABAPENTIN 300 MG PO CAPS: 300.0000 mg | ORAL_CAPSULE | Freq: Three times a day (TID) | ORAL | 2 refills | Status: DC

## 2019-01-30 NOTE — Progress Notes (Signed)
Patient ID: Elizabeth Malone, female   DOB: 1951-07-31, 68 y.o.   MRN: [id number]    Patient is a 68 year old female with history of bipolar and anxiety who was seen for follow-up. She reported that she has been staying by herself and trying to keep healthy. She has been compliant with her medications. She reported that she has been involved in her church activities and staying active. She is compliant with her medications. She mentioned about going to the ER after her last appointment due to increase in the tremors but her lithium level was therapeutic. She did not change any medications. She reported that occasionally she feels that her anxiety is getting worse. We discussed about decreasing the dose of her current medications after he will be having regular scheduled appointments and she agreed with the plan. She sleeping well at this time. Her medications will be refilled.  Plan  Patient will continue her medications as prescribed. Follow up in two months earlier depending on her symptoms.  I discussed the assessment and treatment plan with the patient. The patient was provided an opportunity to ask questions and all were answered. The patient agreed with the plan and demonstrated an understanding of the instructions.   The patient was advised to call back or seek an in-person evaluation if the symptoms worsen or if the condition fails to improve as anticipated.   I provided 15 minutes of face-to-face time during this encounter.

## 2019-02-02 ENCOUNTER — Telehealth: Payer: Self-pay | Admitting: Family Medicine

## 2019-02-02 NOTE — Telephone Encounter (Signed)
Copied from [place] [id number]. Topic: Quick Communication - Rx Refill/Question >> Feb 02, 2019  4:56 PM Erick Blinks wrote: Medication:  oxybutynin (DITROPAN-XL) 10 MG 24 hr tablet + oxybutynin (DITROPAN-XL) 10 MG 24 hr tablet   -Pt is requesting 90 day supply instead of 30 day supply. Please advise  Has the patient contacted their pharmacy?  (Agent: If no, request that the patient contact the pharmacy for the refill.) (Agent: If yes, when and what did the pharmacy advise?)  Preferred Pharmacy (with phone number or street name): [place], [place] [facility] [address] [address] [place] [address]    Agent: Please be advised that RX refills may take up to 3 business days. We ask that you follow-up with your pharmacy.

## 2019-02-03 ENCOUNTER — Other Ambulatory Visit: Payer: Self-pay

## 2019-02-03 DIAGNOSIS — I10 Essential (primary) hypertension: Secondary | ICD-10-CM

## 2019-02-03 NOTE — Telephone Encounter (Signed)
Fax from [facility] stating that patient requested Rx refill on Benazepril tab and Oxybutynin ER tab

## 2019-02-05 MED ORDER — OXYBUTYNIN CHLORIDE ER 10 MG PO TB24: 10.0000 mg | ORAL_TABLET | Freq: Every day | ORAL | 2 refills | Status: DC

## 2019-02-05 MED ORDER — BENAZEPRIL HCL 5 MG PO TABS: 5.0000 mg | ORAL_TABLET | Freq: Every day | ORAL | 2 refills | Status: DC

## 2019-02-09 ENCOUNTER — Ambulatory Visit (INDEPENDENT_AMBULATORY_CARE_PROVIDER_SITE_OTHER): Payer: Medicare Other

## 2019-02-09 DIAGNOSIS — Z0000 Encounter for general adult medical examination without abnormal findings: Secondary | ICD-10-CM

## 2019-02-09 NOTE — Progress Notes (Signed)
Subjective:   Elizabeth Malone is a 68 y.o. female who presents for Medicare Annual (Subsequent) preventive examination.  This visit is being conducted via phone call  - after an attmept to do on video chat - due to the COVID-19 pandemic. This patient has given me verbal consent via phone to conduct this visit, patient states they are participating from their home address. Some vital signs may be absent or patient reported.   Patient identification: identified by name, DOB, and current address.    Review of Systems:   Cardiac Risk Factors include: advanced age (>82men, >53 women);hypertension;dyslipidemia     Objective:     Vitals: There were no vitals taken for this visit.  There is no height or weight on file to calculate BMI.  Advanced Directives 02/09/2019 11/01/2018 02/04/2018 10/07/2017 02/23/2016 02/23/2016 07/11/2015  Does Patient Have a Medical Advance Directive? No No No No Yes No No  Type of Advance Directive - - - - Living will - -  Does patient want to make changes to medical advance directive? - - - - Yes - Spiritual care consult ordered - -  Would patient like information on creating a medical advance directive? - No - Patient declined Yes (MAU/Ambulatory/Procedural Areas - Information given) Yes (MAU/Ambulatory/Procedural Areas - Information given) Yes - Educational materials given;Yes - Spiritual care consult ordered No - patient declined information -  Some encounter information is confidential and restricted. Go to Review Flowsheets activity to see all data.    Tobacco Social History   Tobacco Use  Smoking Status Former Smoker   Types: Cigarettes   Start date: 07/09/1967   Last attempt to quit: 07/08/1985   Years since quitting: 33.6  Smokeless Tobacco Never Used     Counseling given: Not Answered   Clinical Intake:  Pre-visit preparation completed: Yes  Pain : No/denies pain     Nutritional Risks: None Diabetes: No  How often do you need to have  someone help you when you read instructions, pamphlets, or other written materials from your doctor or pharmacy?: 1 - Never What is the last grade level you completed in school?: bachelors  Interpreter Needed?: No  Information entered by :: Channel Papandrea,LPN  Past Medical History:  Diagnosis Date   Anxiety    Bipolar disorder ([place])    CKD (chronic kidney disease) stage 3, GFR 30-59 ml/min (HCC)    Complication of anesthesia    Depression    GERD (gastroesophageal reflux disease)    occasionally has to use tums   Headache    History of diverticulosis    Hyperlipidemia    Overactive bladder    PONV (postoperative nausea and vomiting)    Substance abuse ([place])    alcohol   Past Surgical History:  Procedure Laterality Date   COLONOSCOPY WITH PROPOFOL N/A 10/07/2017   Procedure: COLONOSCOPY WITH PROPOFOL;  Surgeon: [name], MD;  Location: [facility];  Service: Endoscopy;  Laterality: N/A;   FOOT SURGERY Bilateral    KNEE SURGERY Left    POLYPECTOMY  10/07/2017   Procedure: POLYPECTOMY;  Surgeon: [name], MD;  Location: [facility];  Service: Endoscopy;;   TONSILLECTOMY AND ADENOIDECTOMY     Family History  Problem Relation Age of Onset   Alzheimer's disease Mother    Bipolar disorder Mother    Depression Mother    Heart attack Father    Diabetes Brother    Obesity Brother    Depression Maternal Grandfather  Depression Daughter    Stroke Maternal Grandmother    Stroke Paternal Grandmother    Heart attack Paternal Grandfather    Breast cancer Neg Hx    Social History   Socioeconomic History   Marital status: Married    Spouse name: Not on file   Number of children: 2   Years of education: Not on file   Highest education level: Bachelor's degree (e.g., BA, AB, BS)  Occupational History   Occupation: retired   Scientist, product/process development strain: Not hard at [employer] insecurity:    Worry: Never  true    Inability: Never true   Transportation needs:    Medical: No    Non-medical: No  Tobacco Use   Smoking status: Former Smoker    Types: Cigarettes    Start date: 07/09/1967    Last attempt to quit: 07/08/1985    Years since quitting: 33.6   Smokeless tobacco: Never Used  Substance and Sexual Activity   Alcohol use: Yes    Alcohol/week: 3.0 standard drinks    Types: 3 Shots of liquor per week    Comment: occassional   Drug use: No   Sexual activity: Not Currently  Lifestyle   Physical activity:    Days per week: 0 days    Minutes per session: 0 min   Stress: Very much  Relationships   Social connections:    Talks on phone: Once a week    Gets together: Once a week    Attends religious service: More than 4 times per year    Active member of club or organization: Yes    Attends meetings of clubs or organizations: More than 4 times per year    Relationship status: Married  Other Topics Concern   Not on file  Social History Narrative   Not on file    Outpatient Encounter Medications as of 02/09/2019  Medication Sig   atorvastatin (LIPITOR) 20 MG tablet Take 1 tablet (20 mg total) by mouth daily.   benazepril (LOTENSIN) 5 MG tablet Take 1 tablet (5 mg total) by mouth daily.   busPIRone (BUSPAR) 15 MG tablet Take 1 tablet (15 mg total) by mouth 3 (three) times daily.   Calcium Citrate-Vitamin D (CALCIUM + D PO) Take 1 tablet by mouth 2 (two) times daily.    diphenoxylate-atropine (LOMOTIL) 2.5-0.025 MG tablet Take 1 tablet by mouth 4 (four) times daily as needed for diarrhea or loose stools.   gabapentin (NEURONTIN) 300 MG capsule Take 1 capsule (300 mg total) by mouth 3 (three) times daily.   lithium 300 MG tablet Take 2 tablets (600 mg total) by mouth at bedtime.   metoprolol succinate (TOPROL-XL) 25 MG 24 hr tablet Take 1 tablet (25 mg total) by mouth daily.   Multiple Vitamin (MULTIVITAMIN) tablet Take 1 tablet by mouth daily.   Omega-3 Fatty  Acids (FISH OIL) 1000 MG CAPS Take 1,000 mg by mouth 2 (two) times daily.    oxybutynin (DITROPAN-XL) 10 MG 24 hr tablet Take 1 tablet (10 mg total) by mouth at bedtime.   Oxymetazoline HCl (NASAL SPRAY 12 HOUR NA) Place into the nose.   QUEtiapine (SEROQUEL) 50 MG tablet Take 1 tablet (50 mg total) by mouth at bedtime.   topiramate (TOPAMAX) 100 MG tablet Take 1.5 tablets (150 mg total) by mouth daily.   traZODone (DESYREL) 100 MG tablet Take 1 tablet (100 mg total) by mouth at bedtime.   venlafaxine XR (EFFEXOR-XR)  150 MG 24 hr capsule Take 1 capsule (150 mg total) by mouth daily with breakfast.   No facility-administered encounter medications on file as of 02/09/2019.     Activities of Daily Living In your present state of health, do you have any difficulty performing the following activities: 02/09/2019  Hearing? Y  Comment SOME DIFFICULTY  Vision? Y  Comment macular degeneration  Difficulty concentrating or making decisions? N  Walking or climbing stairs? N  Dressing or bathing? N  Doing errands, shopping? N  Preparing Food and eating ? N  Using the Toilet? N  In the past six months, have you accidently leaked urine? N  Do you have problems with loss of bowel control? Y  Comment having a lot of loose stools recently, taking lomotil  Managing your Medications? N  Managing your Finances? N  Housekeeping or managing your Housekeeping? N  Some recent data might be hidden    Patient Care Team: Guadalupe Maple, MD as PCP - General (Family Medicine) Guadalupe Maple, MD as PCP - Family Medicine (Family Medicine)    Assessment:   This is a routine wellness examination for Jacolyn.  Exercise Activities and Dietary recommendations Current Exercise Habits: The patient does not participate in regular exercise at present, Exercise limited by: None identified  Goals   None     Fall Risk: Fall Risk  02/09/2019 02/09/2019 02/04/2018 08/17/2017 12/01/2016  Falls in the past year?  0 0 Yes No No  Number falls in past yr: - - 2 or more - -  Injury with Fall? - - No - -  Risk Factor Category  - - High Fall Risk - -  Follow up - - Falls prevention discussed - -    FALL RISK PREVENTION PERTAINING TO THE HOME:  Any stairs in or around the home? Yes  If so, are there any without handrails? no  Home free of loose throw rugs in walkways, pet beds, electrical cords, etc? Yes  Adequate lighting in your home to reduce risk of falls? Yes   ASSISTIVE DEVICES UTILIZED TO PREVENT FALLS:  Life alert? No  Use of a cane, walker or w/c? No  Grab bars in the bathroom? No  Shower chair or bench in shower? No  Elevated toilet seat or a handicapped toilet? No   DME ORDERS:  DME order needed?  No   TIMED UP AND GO:  Unable to perform    Depression Screen PHQ 2/9 Scores 02/09/2019 02/09/2019 02/04/2018 08/17/2017  PHQ - 2 Score 5 0 0 0  PHQ- 9 Score 12 - - -     Cognitive Function     6CIT Screen 02/04/2018  What Year? 0 points  What month? 0 points  What time? 0 points  Count back from 20 0 points  Months in reverse 0 points  Repeat phrase 0 points  Total Score 0    Immunization History  Administered Date(s) Administered   Influenza, High Dose Seasonal PF 06/06/2018   Influenza-Unspecified 06/05/2015, 06/04/2016, 06/28/2017   Pneumococcal Conjugate-13 10/20/2016   Pneumococcal Polysaccharide-23 03/28/2018   Tdap 03/23/2013   Zoster 01/13/2011   Zoster Recombinat (Shingrix) 02/19/2017, 05/10/2017    Qualifies for Shingles Vaccine? Yes, completed series  Tdap: up to date  Flu Vaccine: up to date  Pneumococcal Vaccine: completed series  Screening Tests Health Maintenance  Topic Date Due   INFLUENZA VACCINE  04/15/2019   MAMMOGRAM  10/27/2020   COLONOSCOPY  10/07/2022   TETANUS/TDAP  03/24/2023   DEXA SCAN  Completed   Hepatitis C Screening  Completed   PNA vac Low Risk Adult  Completed    Cancer Screenings:  Colorectal  Screening: Completed 10/07/2017. Repeat every 5 years;  Mammogram: Completed 10/27/2018. Repeat every year;  Bone Density: Completed 10/27/2018.   Lung Cancer Screening: (Low Dose CT Chest recommended if Age 87-80 years, 30 pack-year currently smoking OR have quit w/in 15years.) does not qualify.    Additional Screening:  Hepatitis C Screening: does qualify; Completed 02/04/2016  Vision Screening: Recommended annual ophthalmology exams for early detection of glaucoma and other disorders of the eye. Is the patient up to date with their annual eye exam?  Yes  Who is the provider or what is the name of the office in which the pt attends annual eye exams? Dr.Woodard   Dental Screening: Recommended annual dental exams for proper oral hygiene  Community Resource Referral:  CRR required this visit?  No       Plan:  I have personally reviewed and addressed the Medicare Annual Wellness questionnaire and have noted the following in the patients chart:  A. Medical and social history B. Use of alcohol, tobacco or illicit drugs  C. Current medications and supplements D. Functional ability and status E.  Nutritional status F.  Physical activity G. Advance directives H. List of other physicians I.  Hospitalizations, surgeries, and ER visits in previous 12 months J.  [place] such as hearing and vision if needed, cognitive and depression L. Referrals and appointments   In addition, I have reviewed and discussed with patient certain preventive protocols, quality metrics, and best practice recommendations. A written personalized care plan for preventive services as well as general preventive health recommendations were provided to patient. Nurse Health Advisor  Signed,    [place], Caro Hight, [place]  12/21/8117 Nurse Health Advisor   Nurse Notes: patient is requesting 90 day supply on her benazapril and oxybutynin. appt with Dr.Crissman on 02/14/2019

## 2019-02-09 NOTE — Patient Instructions (Signed)
Elizabeth Malone , Thank you for taking time to come for your Medicare Wellness Visit. I appreciate your ongoing commitment to your health goals. Please review the following plan we discussed and let me know if I can assist you in the future.   Screening recommendations/referrals: Colonoscopy: completed 10/07/2017 Mammogram: completed 10/27/2018 Bone Density: completed 10/27/2018 Recommended yearly ophthalmology/optometry visit for glaucoma screening and checkup Recommended yearly dental visit for hygiene and checkup  Vaccinations: Influenza vaccine: up to date Pneumococcal vaccine: up to date Tdap vaccine: up to date Shingles vaccine: shingrix series completed    Advanced directives: please pick up a copy of information next time you are in the office.    Next appointment: Follow up in one year for your annual wellness exam.    Preventive Care 65 Years and Older, Female Preventive care refers to lifestyle choices and visits with your health care provider that can promote health and wellness. What does preventive care include?  A yearly physical exam. This is also called an annual well check.  Dental exams once or twice a year.  Routine eye exams. Ask your health care provider how often you should have your eyes checked.  Personal lifestyle choices, including:  Daily care of your teeth and gums.  Regular physical activity.  Eating a healthy diet.  Avoiding tobacco and drug use.  Limiting alcohol use.  Practicing safe sex.  Taking low-dose aspirin every day.  Taking vitamin and mineral supplements as recommended by your health care provider. What happens during an annual well check? The services and screenings done by your health care provider during your annual well check will depend on your age, overall health, lifestyle risk factors, and family history of disease. Counseling  Your health care provider may ask you questions about your:  Alcohol use.  Tobacco use.  Drug  use.  Emotional well-being.  Home and relationship well-being.  Sexual activity.  Eating habits.  History of falls.  Memory and ability to understand (cognition).  Work and work Statistician.  Reproductive health. Screening  You may have the following tests or measurements:  Height, weight, and BMI.  Blood pressure.  Lipid and cholesterol levels. These may be checked every 5 years, or more frequently if you are over 39 years old.  Skin check.  Lung cancer screening. You may have this screening every year starting at age 77 if you have a 30-pack-year history of smoking and currently smoke or have quit within the past 15 years.  Fecal occult blood test (FOBT) of the stool. You may have this test every year starting at age 68.  Flexible sigmoidoscopy or colonoscopy. You may have a sigmoidoscopy every 5 years or a colonoscopy every 10 years starting at age 29.  Hepatitis C blood test.  Hepatitis B blood test.  Sexually transmitted disease (STD) testing.  Diabetes screening. This is done by checking your blood sugar (glucose) after you have not eaten for a while (fasting). You may have this done every 1-3 years.  Bone density scan. This is done to screen for osteoporosis. You may have this done starting at age 76.  Mammogram. This may be done every 1-2 years. Talk to your health care provider about how often you should have regular mammograms. Talk with your health care provider about your test results, treatment options, and if necessary, the need for more tests. Vaccines  Your health care provider may recommend certain vaccines, such as:  Influenza vaccine. This is recommended every year.  Tetanus, diphtheria,  and acellular pertussis (Tdap, Td) vaccine. You may need a Td booster every 10 years.  Zoster vaccine. You may need this after age 65.  Pneumococcal 13-valent conjugate (PCV13) vaccine. One dose is recommended after age 61.  Pneumococcal polysaccharide  (PPSV23) vaccine. One dose is recommended after age 80. Talk to your health care provider about which screenings and vaccines you need and how often you need them. This information is not intended to replace advice given to you by your health care provider. Make sure you discuss any questions you have with your health care provider. Document Released: 09/27/2015 Document Revised: 05/20/2016 Document Reviewed: 07/02/2015 Elsevier Interactive Patient Education  2017 [place] Prevention in the Home Falls can cause injuries. They can happen to people of all ages. There are many things you can do to make your home safe and to help prevent falls. What can I do on the outside of my home?  Regularly fix the edges of walkways and driveways and fix any cracks.  Remove anything that might make you trip as you walk through a door, such as a raised step or threshold.  Trim any bushes or trees on the path to your home.  Use bright outdoor lighting.  Clear any walking paths of anything that might make someone trip, such as rocks or tools.  Regularly check to see if handrails are loose or broken. Make sure that both sides of any steps have handrails.  Any raised decks and porches should have guardrails on the edges.  Have any leaves, snow, or ice cleared regularly.  Use sand or salt on walking paths during winter.  Clean up any spills in your garage right away. This includes oil or grease spills. What can I do in the bathroom?  Use night lights.  Install grab bars by the toilet and in the tub and shower. Do not use towel bars as grab bars.  Use non-skid mats or decals in the tub or shower.  If you need to sit down in the shower, use a plastic, non-slip stool.  Keep the floor dry. Clean up any water that spills on the floor as soon as it happens.  Remove soap buildup in the tub or shower regularly.  Attach bath mats securely with double-sided non-slip rug tape.  Do not have  throw rugs and other things on the floor that can make you trip. What can I do in the bedroom?  Use night lights.  Make sure that you have a light by your bed that is easy to reach.  Do not use any sheets or blankets that are too big for your bed. They should not hang down onto the floor.  Have a firm chair that has side arms. You can use this for support while you get dressed.  Do not have throw rugs and other things on the floor that can make you trip. What can I do in the kitchen?  Clean up any spills right away.  Avoid walking on wet floors.  Keep items that you use a lot in easy-to-reach places.  If you need to reach something above you, use a strong step stool that has a grab bar.  Keep electrical cords out of the way.  Do not use floor polish or wax that makes floors slippery. If you must use wax, use non-skid floor wax.  Do not have throw rugs and other things on the floor that can make you trip. What can I do with my  stairs?  Do not leave any items on the stairs.  Make sure that there are handrails on both sides of the stairs and use them. Fix handrails that are broken or loose. Make sure that handrails are as long as the stairways.  Check any carpeting to make sure that it is firmly attached to the stairs. Fix any carpet that is loose or worn.  Avoid having throw rugs at the top or bottom of the stairs. If you do have throw rugs, attach them to the floor with carpet tape.  Make sure that you have a light switch at the top of the stairs and the bottom of the stairs. If you do not have them, ask someone to add them for you. What else can I do to help prevent falls?  Wear shoes that:  Do not have high heels.  Have rubber bottoms.  Are comfortable and fit you well.  Are closed at the toe. Do not wear sandals.  If you use a stepladder:  Make sure that it is fully opened. Do not climb a closed stepladder.  Make sure that both sides of the stepladder are  locked into place.  Ask someone to hold it for you, if possible.  Clearly mark and make sure that you can see:  Any grab bars or handrails.  First and last steps.  Where the edge of each step is.  Use tools that help you move around (mobility aids) if they are needed. These include:  Canes.  Walkers.  Scooters.  Crutches.  Turn on the lights when you go into a dark area. Replace any light bulbs as soon as they burn out.  Set up your furniture so you have a clear path. Avoid moving your furniture around.  If any of your floors are uneven, fix them.  If there are any pets around you, be aware of where they are.  Review your medicines with your doctor. Some medicines can make you feel dizzy. This can increase your chance of falling. Ask your doctor what other things that you can do to help prevent falls. This information is not intended to replace advice given to you by your health care provider. Make sure you discuss any questions you have with your health care provider. Document Released: 06/27/2009 Document Revised: 02/06/2016 Document Reviewed: 10/05/2014 Elsevier Interactive Patient Education  2017 [employer].

## 2019-02-10 ENCOUNTER — Ambulatory Visit: Payer: Self-pay

## 2019-02-14 ENCOUNTER — Encounter: Payer: Self-pay | Admitting: Family Medicine

## 2019-02-14 ENCOUNTER — Other Ambulatory Visit: Payer: Self-pay

## 2019-02-14 ENCOUNTER — Ambulatory Visit (INDEPENDENT_AMBULATORY_CARE_PROVIDER_SITE_OTHER): Payer: Medicare Other | Admitting: Family Medicine

## 2019-02-14 DIAGNOSIS — E7800 Pure hypercholesterolemia, unspecified: Secondary | ICD-10-CM

## 2019-02-14 DIAGNOSIS — I10 Essential (primary) hypertension: Secondary | ICD-10-CM

## 2019-02-14 DIAGNOSIS — F314 Bipolar disorder, current episode depressed, severe, without psychotic features: Secondary | ICD-10-CM

## 2019-02-14 DIAGNOSIS — Z7189 Other specified counseling: Secondary | ICD-10-CM

## 2019-02-14 MED ORDER — ATORVASTATIN CALCIUM 20 MG PO TABS: 20.0000 mg | ORAL_TABLET | Freq: Every day | ORAL | 4 refills | Status: DC

## 2019-02-14 MED ORDER — METOPROLOL SUCCINATE ER 25 MG PO TB24: 25.0000 mg | ORAL_TABLET | Freq: Every day | ORAL | 4 refills | Status: DC

## 2019-02-14 MED ORDER — OXYBUTYNIN CHLORIDE ER 10 MG PO TB24: 10.0000 mg | ORAL_TABLET | Freq: Every day | ORAL | 4 refills | Status: DC

## 2019-02-14 MED ORDER — FLUTICASONE PROPIONATE 50 MCG/ACT NA SUSP: 2.0000 | Freq: Every day | NASAL | 6 refills | Status: DC

## 2019-02-14 MED ORDER — FEXOFENADINE HCL 180 MG PO TABS: 180.0000 mg | ORAL_TABLET | Freq: Every day | ORAL | 5 refills | Status: DC

## 2019-02-14 MED ORDER — BENAZEPRIL HCL 5 MG PO TABS: 5.0000 mg | ORAL_TABLET | Freq: Every day | ORAL | 4 refills | Status: DC

## 2019-02-14 NOTE — Assessment & Plan Note (Signed)
The current medical regimen is effective;  continue present plan and medications.  

## 2019-02-14 NOTE — Assessment & Plan Note (Signed)
Followed by psy

## 2019-02-14 NOTE — Progress Notes (Signed)
There were no vitals taken for this visit.   Subjective:    Patient ID: Elizabeth Malone, female    DOB: 1950/10/20, 68 y.o.   MRN: [id number]  HPI: Elizabeth Malone is a 68 y.o. female  Follow up, med check   Telemedicine using audio/video telecommunications for a synchronous communication visit. Today's visit due to COVID-19 isolation precautions I connected with and verified that I am speaking with the correct person using two identifiers.  Patient concerned about intermittent diarrhea has had Imodium/Lomotil from GI doctors and is used it intermittently but still has problems with diarrhea discussed using more Imodium. Patient also with intermittent cough lives with 3 cats and 2 dogs that sleep with her patient coughs at night when she lays down in bed and also when she goes outside concerned about allergies.  Patient also takes benazepril. Patient has not felt sick with her cough and it is nonproductive. Patient states she has some forgetfulness when she goes into her room turns around comes back and is fine also same with some times occasional word finding issues but on review no  mental status changes.    I discussed the limitations, risks, security and privacy concerns of performing an evaluation and management service by telecommunication and the availability of in person appointments. I also discussed with the patient that there may be a patient responsible charge related to this service. The patient expressed understanding and agreed to proceed. The patient's location is home. I am at home.   Relevant past medical, surgical, family and social history reviewed and updated as indicated. Interim medical history since our last visit reviewed. Allergies and medications reviewed and updated.  Review of Systems  Constitutional: Negative.   HENT: Negative.   Eyes: Negative.   Respiratory: Negative.   Cardiovascular: Negative.   Gastrointestinal: Negative.   Endocrine: Negative.    Genitourinary: Negative.   Musculoskeletal: Negative.   Skin: Negative.   Allergic/Immunologic: Negative.   Neurological: Negative.   Hematological: Negative.   Psychiatric/Behavioral: Negative.     Per HPI unless specifically indicated above     Objective:    There were no vitals taken for this visit.  Wt Readings from Last 3 Encounters:  11/01/18 189 lb (85.7 kg)  11/01/18 189 lb (85.7 kg)  09/22/18 187 lb 12.8 oz (85.2 kg)    Physical Exam  Results for orders placed or performed during the hospital encounter of 11/01/18  Comprehensive metabolic panel  Result Value Ref Range   Sodium 136 135 - 145 mmol/L   Potassium 4.2 3.5 - 5.1 mmol/L   Chloride 106 98 - 111 mmol/L   CO2 27 22 - 32 mmol/L   Glucose, Bld 110 (H) 70 - 99 mg/dL   BUN 17 8 - 23 mg/dL   Creatinine, Ser 0.80 0.44 - 1.00 mg/dL   Calcium 9.6 8.9 - 10.3 mg/dL   Total Protein 7.6 6.5 - 8.1 g/dL   Albumin 4.5 3.5 - 5.0 g/dL   AST 16 15 - 41 U/L   ALT 17 0 - 44 U/L   Alkaline Phosphatase 66 38 - 126 U/L   Total Bilirubin 0.7 0.3 - 1.2 mg/dL   GFR calc non Af Amer >60 >60 mL/min   GFR calc Af Amer >60 >60 mL/min   Anion gap 3 (L) 5 - 15  CBC  Result Value Ref Range   WBC 7.0 4.0 - 10.5 K/uL   RBC 4.62 3.87 - 5.11 MIL/uL   Hemoglobin  14.5 12.0 - 15.0 g/dL   HCT 44.9 36.0 - 46.0 %   MCV 97.2 80.0 - 100.0 fL   MCH 31.4 26.0 - 34.0 pg   MCHC 32.3 30.0 - 36.0 g/dL   RDW 12.2 11.5 - 15.5 %   Platelets 229 150 - 400 K/uL   nRBC 0.0 0.0 - 0.2 %  Lithium level  Result Value Ref Range   Lithium Lvl 0.74 0.60 - 1.20 mmol/L      Assessment & Plan:   Problem List Items Addressed This Visit      Cardiovascular and Mediastinum   Essential hypertension    The current medical regimen is effective;  continue present plan and medications.       Relevant Medications   atorvastatin (LIPITOR) 20 MG tablet   metoprolol succinate (TOPROL-XL) 25 MG 24 hr tablet   benazepril (LOTENSIN) 5 MG tablet   Other  Relevant Orders   Comprehensive metabolic panel   CBC with Differential/Platelet   TSH   Urinalysis, Routine w reflex microscopic     Other   Bipolar disorder ([place])    Followed by psy      Relevant Orders   Comprehensive metabolic panel   CBC with Differential/Platelet   TSH   Hypercholesteremia    The current medical regimen is effective;  continue present plan and medications.       Relevant Medications   atorvastatin (LIPITOR) 20 MG tablet   metoprolol succinate (TOPROL-XL) 25 MG 24 hr tablet   benazepril (LOTENSIN) 5 MG tablet   Other Relevant Orders   Comprehensive metabolic panel   Lipid panel   CBC with Differential/Platelet   TSH   Advanced care planning/counseling discussion    A voluntary discussion about advanced care planning including explanation and discussion of advanced directives was extentively discussed with the patient.  Explained about the healthcare proxy and living will was reviewed and packet with forms with expiration of how to fill them out was given.  Time spent: Encounter 16+ min individuals present: Patient        I discussed the assessment and treatment plan with the patient. The patient was provided an opportunity to ask questions and all were answered. The patient agreed with the plan and demonstrated an understanding of the instructions.   The patient was advised to call back or seek an in-person evaluation if the symptoms worsen or if the condition fails to improve as anticipated.   I provided 21+ minutes of time during this encounter.  Follow up plan: Return in about 2 weeks (around 02/28/2019) for Physical Exam, labs ordered.

## 2019-02-14 NOTE — Assessment & Plan Note (Signed)
A voluntary discussion about advanced care planning including explanation and discussion of advanced directives was extentively discussed with the patient.  Explained about the healthcare proxy and living will was reviewed and packet with forms with expiration of how to fill them out was given.  Time spent: Encounter 16+ min individuals present: Patient 

## 2019-02-23 ENCOUNTER — Other Ambulatory Visit: Payer: Self-pay

## 2019-02-23 ENCOUNTER — Ambulatory Visit (INDEPENDENT_AMBULATORY_CARE_PROVIDER_SITE_OTHER): Payer: Medicare Other | Admitting: Family Medicine

## 2019-02-23 ENCOUNTER — Encounter: Payer: Self-pay | Admitting: Family Medicine

## 2019-02-23 VITALS — BP 133/85 | HR 70 | Temp 98.4°F | Ht 65.0 in | Wt 193.0 lb

## 2019-02-23 DIAGNOSIS — E7800 Pure hypercholesterolemia, unspecified: Secondary | ICD-10-CM

## 2019-02-23 DIAGNOSIS — J309 Allergic rhinitis, unspecified: Secondary | ICD-10-CM | POA: Insufficient documentation

## 2019-02-23 DIAGNOSIS — Z0000 Encounter for general adult medical examination without abnormal findings: Secondary | ICD-10-CM | POA: Diagnosis not present

## 2019-02-23 DIAGNOSIS — F314 Bipolar disorder, current episode depressed, severe, without psychotic features: Secondary | ICD-10-CM

## 2019-02-23 DIAGNOSIS — I10 Essential (primary) hypertension: Secondary | ICD-10-CM | POA: Diagnosis not present
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
## 2019-02-23 NOTE — Patient Instructions (Signed)
Plain mucinex is good to break up the mucus

## 2019-02-24 LAB — COMPREHENSIVE METABOLIC PANEL
ALT: 16 IU/L (ref 0–32)
AST: 16 IU/L (ref 0–40)
Albumin/Globulin Ratio: 2.2 (ref 1.2–2.2)
Albumin: 4.6 g/dL (ref 3.8–4.8)
Alkaline Phosphatase: 88 IU/L (ref 39–117)
BUN/Creatinine Ratio: 16 (ref 12–28)
BUN: 14 mg/dL (ref 8–27)
Bilirubin Total: 0.5 mg/dL (ref 0.0–1.2)
CO2: 24 mmol/L (ref 20–29)
Calcium: 9.7 mg/dL (ref 8.7–10.3)
Chloride: 105 mmol/L (ref 96–106)
Creatinine, Ser: 0.9 mg/dL (ref 0.57–1.00)
GFR calc Af Amer: 77 mL/min/{1.73_m2} (ref >59)
GFR calc non Af Amer: 66 mL/min/{1.73_m2} (ref >59)
Globulin, Total: 2.1 g/dL (ref 1.5–4.5)
Glucose: 100 mg/dL — ABNORMAL HIGH (ref 65–99)
Potassium: 4.9 mmol/L (ref 3.5–5.2)
Sodium: 142 mmol/L (ref 134–144)
Total Protein: 6.7 g/dL (ref 6.0–8.5)

## 2019-02-24 LAB — CBC WITH DIFFERENTIAL/PLATELET
Basophils Absolute: 0.1 10*3/uL (ref 0.0–0.2)
Basos: 1 %
EOS (ABSOLUTE): 0.2 10*3/uL (ref 0.0–0.4)
Eos: 4 %
Hematocrit: 43.5 % (ref 34.0–46.6)
Hemoglobin: 14.4 g/dL (ref 11.1–15.9)
Immature Grans (Abs): 0 10*3/uL (ref 0.0–0.1)
Immature Granulocytes: 0 %
Lymphocytes Absolute: 1.6 10*3/uL (ref 0.7–3.1)
Lymphs: 27 %
MCH: 31.9 pg (ref 26.6–33.0)
MCHC: 33.1 g/dL (ref 31.5–35.7)
MCV: 96 fL (ref 79–97)
Monocytes Absolute: 0.4 10*3/uL (ref 0.1–0.9)
Monocytes: 7 %
Neutrophils Absolute: 3.7 10*3/uL (ref 1.4–7.0)
Neutrophils: 61 %
Platelets: 225 10*3/uL (ref 150–450)
RBC: 4.52 x10E6/uL (ref 3.77–5.28)
RDW: 11.7 % (ref 11.7–15.4)
WBC: 6 10*3/uL (ref 3.4–10.8)

## 2019-02-24 LAB — URINALYSIS, ROUTINE W REFLEX MICROSCOPIC
Bilirubin, UA: NEGATIVE
Glucose, UA: NEGATIVE
Ketones, UA: NEGATIVE
Nitrite, UA: NEGATIVE
Protein,UA: NEGATIVE
RBC, UA: NEGATIVE
Specific Gravity, UA: 1.02 (ref 1.005–1.030)
Urobilinogen, Ur: 0.2 mg/dL (ref 0.2–1.0)
pH, UA: 7.5 (ref 5.0–7.5)

## 2019-02-24 LAB — MICROSCOPIC EXAMINATION: RBC, Urine: NONE SEEN /hpf (ref 0–2)

## 2019-02-24 LAB — LIPID PANEL
Chol/HDL Ratio: 3.2 ratio (ref 0.0–4.4)
Cholesterol, Total: 226 mg/dL — ABNORMAL HIGH (ref 100–199)
HDL: 70 mg/dL (ref >39)
LDL Calculated: 112 mg/dL — ABNORMAL HIGH (ref 0–99)
Triglycerides: 221 mg/dL — ABNORMAL HIGH (ref 0–149)
VLDL Cholesterol Cal: 44 mg/dL — ABNORMAL HIGH (ref 5–40)

## 2019-02-24 LAB — TSH: TSH: 3.08 u[IU]/mL (ref 0.450–4.500)

## 2019-02-27 ENCOUNTER — Encounter: Payer: Self-pay | Admitting: Family Medicine

## 2019-03-06 ENCOUNTER — Encounter: Payer: Self-pay | Admitting: Psychiatry

## 2019-03-06 ENCOUNTER — Other Ambulatory Visit: Payer: Self-pay

## 2019-03-06 ENCOUNTER — Ambulatory Visit (INDEPENDENT_AMBULATORY_CARE_PROVIDER_SITE_OTHER): Payer: Medicare Other | Admitting: Psychiatry

## 2019-03-06 DIAGNOSIS — F419 Anxiety disorder, unspecified: Secondary | ICD-10-CM

## 2019-03-06 DIAGNOSIS — F3160 Bipolar disorder, current episode mixed, unspecified: Secondary | ICD-10-CM | POA: Diagnosis not present

## 2019-03-06 NOTE — Progress Notes (Signed)
Patient ID: Elizabeth Malone, female   DOB: 04/14/1951, 68 y.o.   MRN: [id number]  Patient is a 68 year old married female who was followed up for her medications. She reported that she recently had her physical exam done and has to discuss her lab work with her PCP as she has noticed some lab results were higher than usual. She is concerned about the same. Patient reported that she was eating more sugar and ice cream after the covert started. Patient reported that she has been compliant with her medications. She currently denied feeling depressed hopeless helpless. She denied having any suicidal or homicidal ideations or plans. She's trying to lose weight and will increase her physical activity.  Plan Patient has enough supply of her medications. She will follow up in two months earlier depending on her symptoms.  I discussed the assessment and treatment plan with the patient. The patient was provided an opportunity to ask questions and all were answered. The patient agreed with the plan and demonstrated an understanding of the instructions.   The patient was advised to call back or seek an in-person evaluation if the symptoms worsen or if the condition fails to improve as anticipated.   I provided 15 minutes of non-face-to-face time during this encounter.

## 2019-05-08 ENCOUNTER — Other Ambulatory Visit: Payer: Self-pay

## 2019-05-08 ENCOUNTER — Ambulatory Visit (INDEPENDENT_AMBULATORY_CARE_PROVIDER_SITE_OTHER): Payer: Medicare Other | Admitting: Psychiatry

## 2019-05-08 ENCOUNTER — Encounter: Payer: Self-pay | Admitting: Psychiatry

## 2019-05-08 DIAGNOSIS — F419 Anxiety disorder, unspecified: Secondary | ICD-10-CM | POA: Diagnosis not present

## 2019-05-08 DIAGNOSIS — F3160 Bipolar disorder, current episode mixed, unspecified: Secondary | ICD-10-CM | POA: Diagnosis not present

## 2019-05-08 MED ORDER — QUETIAPINE FUMARATE 50 MG PO TABS: 50.0000 mg | ORAL_TABLET | Freq: Every day | ORAL | 1 refills | Status: DC

## 2019-05-08 MED ORDER — TOPIRAMATE 100 MG PO TABS: 150.0000 mg | ORAL_TABLET | Freq: Every day | ORAL | 1 refills | Status: DC

## 2019-05-08 MED ORDER — GABAPENTIN 300 MG PO CAPS: 300.0000 mg | ORAL_CAPSULE | Freq: Three times a day (TID) | ORAL | 2 refills | Status: DC

## 2019-05-08 MED ORDER — TRAZODONE HCL 100 MG PO TABS: 100.0000 mg | ORAL_TABLET | Freq: Every day | ORAL | 1 refills | Status: DC

## 2019-05-08 MED ORDER — BUSPIRONE HCL 15 MG PO TABS: 15.0000 mg | ORAL_TABLET | Freq: Three times a day (TID) | ORAL | 3 refills | Status: DC

## 2019-05-08 MED ORDER — VENLAFAXINE HCL ER 150 MG PO CP24: 150.0000 mg | ORAL_CAPSULE | Freq: Every day | ORAL | 1 refills | Status: DC

## 2019-05-08 MED ORDER — LITHIUM CARBONATE 300 MG PO TABS: 600.0000 mg | ORAL_TABLET | Freq: Every day | ORAL | 1 refills | Status: DC

## 2019-05-08 NOTE — Progress Notes (Signed)
Patient ID: Elizabeth Malone, female   DOB: 1950/11/29, 68 y.o.   MRN: [id number]    Patient is a 68 year old female with history of bipolar and anxiety followed up for medication management. She reported that she has been doing well and has been taking her medications as prescribed. She was talking about her family in detail. She reported that she has been staying safe and staying at home. She attends church regularly. She does not have any side effects of the medications. No perceptual disturbances noted. She denied feeling depressed hopeless helpless. No other acute symptoms noted at this time. She has been sleeping well. She reported that she was feeling sick for a few days but now she is recuperating. Denied having any side effects of the medications.  Plan I will refill her medications. Follow-up in three months earlier depending on her symptoms   I connected with patient via telemedicine application and verified that I am speaking with the correct person using two identifiers.  I discussed the limitations of evaluation and management by telemedicine and the availability of in person appointments. The patient expressed understanding and agreed to proceed.   I discussed the assessment and treatment plan with the patient. The patient was provided an opportunity to ask questions and all were answered. The patient agreed with the plan and demonstrated an understanding of the instructions.   The patient was advised to call back or seek an in-person evaluation if the symptoms worsen or if the condition fails to improve as anticipated.   I provided 15 minutes of non-face-to-face time during this encounter.

## 2019-06-15 ENCOUNTER — Ambulatory Visit (INDEPENDENT_AMBULATORY_CARE_PROVIDER_SITE_OTHER): Payer: Medicare Other

## 2019-06-15 ENCOUNTER — Other Ambulatory Visit: Payer: Self-pay

## 2019-06-15 DIAGNOSIS — Z23 Encounter for immunization: Secondary | ICD-10-CM | POA: Diagnosis not present

## 2019-07-24 ENCOUNTER — Ambulatory Visit: Payer: Medicare Other | Admitting: Psychiatry

## 2019-08-24 ENCOUNTER — Telehealth: Payer: Self-pay | Admitting: Family Medicine

## 2019-08-24 NOTE — Telephone Encounter (Signed)
Called pt to see about doing virtual. Pt states that she has congestion and drainage but has been dealing with this since July. States that Elizabeth Malone is aware. She does not want to do a virtual, she wants to come in because she wants to have provider listen to her lungs and also she would like to see if she is needing to have a sleep study done because her husband is worried about her breathing when she sleeps. Please advise.

## 2019-08-24 NOTE — Telephone Encounter (Signed)
OK to come in if no new sxs

## 2019-08-25 ENCOUNTER — Other Ambulatory Visit: Payer: Self-pay

## 2019-08-25 ENCOUNTER — Encounter: Payer: Self-pay | Admitting: Family Medicine

## 2019-08-25 ENCOUNTER — Ambulatory Visit: Payer: Medicare Other | Admitting: Family Medicine

## 2019-08-25 VITALS — BP 121/72 | HR 54 | Temp 98.4°F | Ht 65.0 in | Wt 202.0 lb

## 2019-08-25 DIAGNOSIS — N898 Other specified noninflammatory disorders of vagina: Secondary | ICD-10-CM

## 2019-08-25 DIAGNOSIS — I10 Essential (primary) hypertension: Secondary | ICD-10-CM

## 2019-08-25 DIAGNOSIS — E7800 Pure hypercholesterolemia, unspecified: Secondary | ICD-10-CM

## 2019-08-25 DIAGNOSIS — F314 Bipolar disorder, current episode depressed, severe, without psychotic features: Secondary | ICD-10-CM

## 2019-08-25 DIAGNOSIS — G4733 Obstructive sleep apnea (adult) (pediatric): Secondary | ICD-10-CM

## 2019-08-25 DIAGNOSIS — B9689 Other specified bacterial agents as the cause of diseases classified elsewhere: Secondary | ICD-10-CM

## 2019-08-25 DIAGNOSIS — N760 Acute vaginitis: Secondary | ICD-10-CM

## 2019-08-25 DIAGNOSIS — R21 Rash and other nonspecific skin eruption: Secondary | ICD-10-CM

## 2019-08-25 DIAGNOSIS — J3089 Other allergic rhinitis: Secondary | ICD-10-CM | POA: Diagnosis not present

## 2019-08-25 DIAGNOSIS — F3132 Bipolar disorder, current episode depressed, moderate: Secondary | ICD-10-CM

## 2019-08-25 LAB — WET PREP FOR TRICH, YEAST, CLUE
Clue Cell Exam: POSITIVE — AB
Trichomonas Exam: NEGATIVE
Yeast Exam: NEGATIVE

## 2019-08-25 MED ORDER — FLUTICASONE PROPIONATE 50 MCG/ACT NA SUSP: 2.0000 | Freq: Every day | NASAL | 6 refills | Status: DC

## 2019-08-25 MED ORDER — METRONIDAZOLE 500 MG PO TABS: 500.0000 mg | ORAL_TABLET | Freq: Two times a day (BID) | ORAL | 0 refills | Status: DC

## 2019-08-25 MED ORDER — FEXOFENADINE HCL 180 MG PO TABS: 180.0000 mg | ORAL_TABLET | Freq: Every day | ORAL | 5 refills | Status: DC

## 2019-08-25 MED ORDER — NYSTATIN 100000 UNIT/GM EX CREA: 1.0000 "application " | TOPICAL_CREAM | Freq: Two times a day (BID) | CUTANEOUS | 1 refills | Status: DC

## 2019-08-25 MED ORDER — MONTELUKAST SODIUM 10 MG PO TABS: 10.0000 mg | ORAL_TABLET | Freq: Every day | ORAL | 3 refills | Status: DC

## 2019-08-25 NOTE — Progress Notes (Signed)
BP 121/72   Pulse (!) 54   Temp 98.4 F (36.9 C) (Oral)   Ht 5\' 5"  (1.651 m)   Wt 202 lb (91.6 kg)   SpO2 95%   BMI 33.61 kg/m    Subjective:    Patient ID: Elizabeth Malone, female    DOB: 23-Nov-1950, 68 y.o.   MRN: [id number]  HPI: Elizabeth Malone is a 68 y.o. female  Chief Complaint  Patient presents with  . Hypertension  . Hyperlipidemia   Here today for 6 month f/u chronic conditions.   HTN - Checks home BPs occasionally, typically 120s/80s range. Denies CP, SOB, HAs, dizziness. Takes medicines faithfully without side effects.   HLD - currently diet controlled. Does not follow specific diet or exercise. Denies claudication, DOE.   Had a rash b/l forearms that was very itchy. Thinks she's been scratching when anxious and that seems to be perpetuating things. Trying some creams she had at home with mild relief.   Also dealing with some skin lesions under her breasts that annoy her at times. States she thinks they're skin tags. Husband pulled one off the other day and she's concerned that may have caused some issues. Does have a Paediatric nurse, sees them next month.   A few months of vaginal odor and discharge. Denies dysuria, hematuria, abdominal pain, concern for STIs. Has not been trying anything new topically.   Started taking naps in the day and that is new for her. Fhx of OSA, was tested years ago without dx but now recently husband noticed her having episodes of not breathing.   Relevant past medical, surgical, family and social history reviewed and updated as indicated. Interim medical history since our last visit reviewed. Allergies and medications reviewed and updated.  Review of Systems  Per HPI unless specifically indicated above     Objective:    BP 121/72   Pulse (!) 54   Temp 98.4 F (36.9 C) (Oral)   Ht 5\' 5"  (1.651 m)   Wt 202 lb (91.6 kg)   SpO2 95%   BMI 33.61 kg/m   Wt Readings from Last 3 Encounters:  08/25/19 202 lb (91.6 kg)  02/23/19 193  lb (87.5 kg)  11/01/18 189 lb (85.7 kg)    Physical Exam Vitals and nursing note reviewed.  Constitutional:      Appearance: Normal appearance. She is not ill-appearing.  HENT:     Head: Atraumatic.  Eyes:     Extraocular Movements: Extraocular movements intact.     Conjunctiva/sclera: Conjunctivae normal.  Cardiovascular:     Rate and Rhythm: Normal rate and regular rhythm.     Heart sounds: Normal heart sounds.  Pulmonary:     Effort: Pulmonary effort is normal.     Breath sounds: Normal breath sounds.  Musculoskeletal:        General: Normal range of motion.     Cervical back: Normal range of motion and neck supple.  Skin:    General: Skin is warm and dry.     Comments: Mild erythema, dry skin and some scabbed lesions on forearms Multiple seborrheic keratoses present under breasts, and numerous scattered across remainder of body  Neurological:     Mental Status: She is alert and oriented to person, place, and time.  Psychiatric:        Mood and Affect: Mood normal.        Thought Content: Thought content normal.        Judgment: Judgment normal.  Results for orders placed or performed in visit on 08/25/19  WET PREP FOR [place], YEAST, CLUE   Specimen: Vaginal; Sterile Swab   STERILE SWAB  Result Value Ref Range   Trichomonas Exam Negative Negative   Yeast Exam Negative Negative   Clue Cell Exam Positive (A) Negative  Microscopic Examination   URINE  Result Value Ref Range   WBC, UA 0-5 0 - 5 /hpf   RBC 0-2 0 - 2 /hpf   Epithelial Cells (non renal) 0-10 0 - 10 /hpf   Bacteria, UA None seen None seen/Few  Urine Culture, Reflex   URINE  Result Value Ref Range   Urine Culture, Routine WILL FOLLOW   Comprehensive metabolic panel  Result Value Ref Range   Glucose 96 65 - 99 mg/dL   BUN 18 8 - 27 mg/dL   Creatinine, Ser 1.06 (H) 0.57 - 1.00 mg/dL   GFR calc non Af Amer 54 (L) >59 mL/min/1.73   GFR calc Af Amer 62 >59 mL/min/1.73   BUN/Creatinine Ratio 17 12 -  28   Sodium 139 134 - 144 mmol/L   Potassium 4.4 3.5 - 5.2 mmol/L   Chloride 104 96 - 106 mmol/L   CO2 20 20 - 29 mmol/L   Calcium 9.7 8.7 - 10.3 mg/dL   Total Protein 6.7 6.0 - 8.5 g/dL   Albumin 4.7 3.8 - 4.8 g/dL   Globulin, Total 2.0 1.5 - 4.5 g/dL   Albumin/Globulin Ratio 2.4 (H) 1.2 - 2.2   Bilirubin Total 0.3 0.0 - 1.2 mg/dL   Alkaline Phosphatase 91 39 - 117 IU/L   AST 19 0 - 40 IU/L   ALT 24 0 - 32 IU/L  CBC with Differential/Platelet out  Result Value Ref Range   WBC 5.5 3.4 - 10.8 x10E3/uL   RBC 4.41 3.77 - 5.28 x10E6/uL   Hemoglobin 14.3 11.1 - 15.9 g/dL   Hematocrit 41.6 34.0 - 46.6 %   MCV 94 79 - 97 fL   MCH 32.4 26.6 - 33.0 pg   MCHC 34.4 31.5 - 35.7 g/dL   RDW 11.5 (L) 11.7 - 15.4 %   Platelets 199 150 - 450 x10E3/uL   Neutrophils 57 Not Estab. %   Lymphs 30 Not Estab. %   Monocytes 8 Not Estab. %   Eos 4 Not Estab. %   Basos 1 Not Estab. %   Neutrophils Absolute 3.2 1.4 - 7.0 x10E3/uL   Lymphocytes Absolute 1.6 0.7 - 3.1 x10E3/uL   Monocytes Absolute 0.4 0.1 - 0.9 x10E3/uL   EOS (ABSOLUTE) 0.2 0.0 - 0.4 x10E3/uL   Basophils Absolute 0.0 0.0 - 0.2 x10E3/uL   Immature Granulocytes 0 Not Estab. %   Immature Grans (Abs) 0.0 0.0 - 0.1 x10E3/uL  Lipid Panel w/o Chol/HDL Ratio out  Result Value Ref Range   Cholesterol, Total 208 (H) 100 - 199 mg/dL   Triglycerides 254 (H) 0 - 149 mg/dL   HDL 63 >39 mg/dL   VLDL Cholesterol Cal 43 (H) 5 - 40 mg/dL   LDL Chol Calc (NIH) 102 (H) 0 - 99 mg/dL  UA/M w/rflx Culture, Routine   Specimen: Urine   URINE  Result Value Ref Range   Specific Gravity, UA 1.015 1.005 - 1.030   pH, UA 5.5 5.0 - 7.5   Color, UA Yellow Yellow   Appearance Ur Clear Clear   Leukocytes,UA 2+ (A) Negative   Protein,UA Negative Negative/Trace   Glucose, UA Negative Negative   Ketones, UA  Negative Negative   RBC, UA Trace (A) Negative   Bilirubin, UA Negative Negative   Urobilinogen, Ur 0.2 0.2 - 1.0 mg/dL   Nitrite, UA Negative Negative     Microscopic Examination See below:    Urinalysis Reflex Comment       Assessment & Plan:   Problem List Items Addressed This Visit      Cardiovascular and Mediastinum   Essential hypertension - Primary    BPs stable and WNL, continue current regimen      Relevant Orders   Comprehensive metabolic panel (Completed)   CBC with Differential/Platelet out (Completed)   UA/M w/rflx Culture, Routine (Completed)     Respiratory   Allergic rhinitis    Continue good allergy regimen, work on consistent use        Other   Bipolar disorder ([place])   Hypercholesteremia    Recheck lipids, adjust as needed. Continue working on diet and exercise habits.       Relevant Orders   Lipid Panel w/o Chol/HDL Ratio out (Completed)   Bipolar 1 disorder, depressed, moderate ([place])    Followed closely by Psychiatry. Continue per their recommendation       Other Visit Diagnoses    Obstructive sleep apnea syndrome       New referral placed for repeat sleep evaluation due to worsening sxs   Relevant Orders   Ambulatory referral to Sleep Studies   BV (bacterial vaginosis)       Wet prep +, tx with flagyl, good vaginal hygiene reviewed. F/u if not improving   Relevant Medications   nystatin cream (MYCOSTATIN)   metroNIDAZOLE (FLAGYL) 500 MG tablet   Other Relevant Orders   WET PREP FOR [place], YEAST, CLUE (Completed)   Rash       Moitsurizers, triamcinolone cream BID prn. Avoid scratching and take allergy regimen consistently.        Follow up plan: Return in about 6 months (around 02/23/2020) for CPE.

## 2019-08-26 LAB — COMPREHENSIVE METABOLIC PANEL
ALT: 24 IU/L (ref 0–32)
AST: 19 IU/L (ref 0–40)
Albumin/Globulin Ratio: 2.4 — ABNORMAL HIGH (ref 1.2–2.2)
Albumin: 4.7 g/dL (ref 3.8–4.8)
Alkaline Phosphatase: 91 IU/L (ref 39–117)
BUN/Creatinine Ratio: 17 (ref 12–28)
BUN: 18 mg/dL (ref 8–27)
Bilirubin Total: 0.3 mg/dL (ref 0.0–1.2)
CO2: 20 mmol/L (ref 20–29)
Calcium: 9.7 mg/dL (ref 8.7–10.3)
Chloride: 104 mmol/L (ref 96–106)
Creatinine, Ser: 1.06 mg/dL — ABNORMAL HIGH (ref 0.57–1.00)
GFR calc Af Amer: 62 mL/min/{1.73_m2} (ref >59)
GFR calc non Af Amer: 54 mL/min/{1.73_m2} — ABNORMAL LOW (ref >59)
Globulin, Total: 2 g/dL (ref 1.5–4.5)
Glucose: 96 mg/dL (ref 65–99)
Potassium: 4.4 mmol/L (ref 3.5–5.2)
Sodium: 139 mmol/L (ref 134–144)
Total Protein: 6.7 g/dL (ref 6.0–8.5)

## 2019-08-26 LAB — CBC WITH DIFFERENTIAL/PLATELET
Basophils Absolute: 0 10*3/uL (ref 0.0–0.2)
Basos: 1 %
EOS (ABSOLUTE): 0.2 10*3/uL (ref 0.0–0.4)
Eos: 4 %
Hematocrit: 41.6 % (ref 34.0–46.6)
Hemoglobin: 14.3 g/dL (ref 11.1–15.9)
Immature Grans (Abs): 0 10*3/uL (ref 0.0–0.1)
Immature Granulocytes: 0 %
Lymphocytes Absolute: 1.6 10*3/uL (ref 0.7–3.1)
Lymphs: 30 %
MCH: 32.4 pg (ref 26.6–33.0)
MCHC: 34.4 g/dL (ref 31.5–35.7)
MCV: 94 fL (ref 79–97)
Monocytes Absolute: 0.4 10*3/uL (ref 0.1–0.9)
Monocytes: 8 %
Neutrophils Absolute: 3.2 10*3/uL (ref 1.4–7.0)
Neutrophils: 57 %
Platelets: 199 10*3/uL (ref 150–450)
RBC: 4.41 x10E6/uL (ref 3.77–5.28)
RDW: 11.5 % — ABNORMAL LOW (ref 11.7–15.4)
WBC: 5.5 10*3/uL (ref 3.4–10.8)

## 2019-08-26 LAB — LIPID PANEL W/O CHOL/HDL RATIO
Cholesterol, Total: 208 mg/dL — ABNORMAL HIGH (ref 100–199)
HDL: 63 mg/dL (ref >39)
LDL Chol Calc (NIH): 102 mg/dL — ABNORMAL HIGH (ref 0–99)
Triglycerides: 254 mg/dL — ABNORMAL HIGH (ref 0–149)
VLDL Cholesterol Cal: 43 mg/dL — ABNORMAL HIGH (ref 5–40)

## 2019-08-29 ENCOUNTER — Encounter: Payer: Self-pay | Admitting: Psychiatry

## 2019-08-29 ENCOUNTER — Other Ambulatory Visit: Payer: Self-pay

## 2019-08-29 ENCOUNTER — Ambulatory Visit (INDEPENDENT_AMBULATORY_CARE_PROVIDER_SITE_OTHER): Payer: Medicare Other | Admitting: Psychiatry

## 2019-08-29 DIAGNOSIS — F419 Anxiety disorder, unspecified: Secondary | ICD-10-CM | POA: Diagnosis not present

## 2019-08-29 DIAGNOSIS — F3132 Bipolar disorder, current episode depressed, moderate: Secondary | ICD-10-CM

## 2019-08-29 MED ORDER — GABAPENTIN 300 MG PO CAPS: 300.0000 mg | ORAL_CAPSULE | Freq: Three times a day (TID) | ORAL | 0 refills | Status: DC

## 2019-08-29 MED ORDER — TRAZODONE HCL 100 MG PO TABS: 100.0000 mg | ORAL_TABLET | Freq: Every day | ORAL | 0 refills | Status: DC

## 2019-08-29 MED ORDER — LITHIUM CARBONATE 300 MG PO TABS: 600.0000 mg | ORAL_TABLET | Freq: Every day | ORAL | 0 refills | Status: DC

## 2019-08-29 MED ORDER — VENLAFAXINE HCL ER 75 MG PO CP24: ORAL_CAPSULE | ORAL | 0 refills | Status: DC

## 2019-08-29 NOTE — Progress Notes (Signed)
[facility] MD OP Progress Note  I connected with  Elizabeth Malone on 08/29/19 by a video enabled telemedicine application and verified that I am speaking with the correct person using two identifiers.   I discussed the limitations of evaluation and management by telemedicine. The patient expressed understanding and agreed to proceed.    08/29/2019 3:09 PM Elizabeth Malone  MRN:  [id number]  Chief Complaint:  " I have been feeling depressed."  HPI: Patient stated that she has been feeling depressed for the past couple of months.  Ever since the restrictions due to COVID-19 started she has been homebound and has gained some weight.  She informed that she has been sleeping almost 12 hours per night.  She stated that she does not feel like taking initiative of different activities she is in charge of.  She needs more motivation to take care of her routine activities.  She became tearful while talking about this. Patient was noted to be on multiple different medications.  She informed that she used to be on 300 mg of Seroquel and the dose was gradually reduced to 50 mg.  She also informed she has been on Topamax 150 mg for headaches.  She stated that she needs gabapentin for tremors. She was agreeable to increase the dose of Effexor for optimal control of symptoms.  Visit Diagnosis:    ICD-10-CM   1. Bipolar 1 disorder, depressed, moderate ([place])  F31.32   2. Anxiety  F41.9     Past Psychiatric History: Bipolar disorder, anxiety  Past Medical History:  Past Medical History:  Diagnosis Date  . Anxiety   . Bipolar disorder ([place])   . CKD (chronic kidney disease) stage 3, GFR 30-59 ml/min   . Complication of anesthesia   . Depression   . GERD (gastroesophageal reflux disease)    occasionally has to use tums  . Headache   . History of diverticulosis   . Hyperlipidemia   . Overactive bladder   . PONV (postoperative nausea and vomiting)   . Substance abuse ([place])    alcohol    Past Surgical History:   Procedure Laterality Date  . COLONOSCOPY WITH PROPOFOL N/A 10/07/2017   Procedure: COLONOSCOPY WITH PROPOFOL;  Surgeon: [name], MD;  Location: [facility];  Service: Endoscopy;  Laterality: N/A;  . FOOT SURGERY Bilateral   . KNEE SURGERY Left   . POLYPECTOMY  10/07/2017   Procedure: POLYPECTOMY;  Surgeon: [name], MD;  Location: [facility];  Service: Endoscopy;;  . TONSILLECTOMY AND ADENOIDECTOMY      Family Psychiatric History: see below   Family History:  Family History  Problem Relation Age of Onset  . Alzheimer's disease Mother   . Bipolar disorder Mother   . Depression Mother   . Heart attack Father   . Diabetes Brother   . Obesity Brother   . Depression Maternal Grandfather   . Depression Daughter   . Stroke Maternal Grandmother   . Stroke Paternal Grandmother   . Heart attack Paternal Grandfather   . Breast cancer Neg Hx     Social History:  Social History   Socioeconomic History  . Marital status: Married    Spouse name: Not on file  . Number of children: 2  . Years of education: Not on file  . Highest education level: Bachelor's degree (e.g., BA, AB, BS)  Occupational History  . Occupation: retired   Tobacco Use  . Smoking status: Former Smoker    Types:  Cigarettes    Start date: 07/09/1967    Quit date: 07/08/1985    Years since quitting: 34.1  . Smokeless tobacco: Never Used  Substance and Sexual Activity  . Alcohol use: Yes    Alcohol/week: 3.0 standard drinks    Types: 3 Shots of liquor per week    Comment: occassional  . Drug use: No  . Sexual activity: Not Currently  Other Topics Concern  . Not on file  Social History Narrative  . Not on file   Social Determinants of Health   Financial Resource Strain:   . Difficulty of Paying Living Expenses: Not on file  Food Insecurity:   . Worried About Charity fundraiser in the Last Year: Not on file  . Ran Out of Food in the Last Year: Not on file  Transportation  Needs:   . Lack of Transportation (Medical): Not on file  . Lack of Transportation (Non-Medical): Not on file  Physical Activity:   . Days of Exercise per Week: Not on file  . Minutes of Exercise per Session: Not on file  Stress:   . Feeling of Stress : Not on file  Social Connections:   . Frequency of Communication with Friends and Family: Not on file  . Frequency of Social Gatherings with Friends and Family: Not on file  . Attends Religious Services: Not on file  . Active Member of Clubs or Organizations: Not on file  . Attends Archivist Meetings: Not on file  . Marital Status: Not on file    Allergies:  Allergies  Allergen Reactions  . Ace Inhibitors Other (See Comments)  . Fluoxetine Other (See Comments)  . Hydrocodone-Chlorpheniramine Other (See Comments)  . Paroxetine Hcl Other (See Comments)  . Penicillin G Benzathine Other (See Comments)  . Penicillin V Potassium Other (See Comments)  . Clindamycin Hcl Rash and Other (See Comments)    Katherina Right' syndrome  . Lamotrigine Rash    Metabolic Disorder Labs: Lab Results  Component Value Date   HGBA1C 4.9 08/17/2017   No results found for: PROLACTIN Lab Results  Component Value Date   CHOL 208 (H) 08/25/2019   TRIG 254 (H) 08/25/2019   HDL 63 08/25/2019   CHOLHDL 3.2 02/23/2019   VLDL 39 (H) 08/17/2017   LDLCALC 102 (H) 08/25/2019   LDLCALC 112 (H) 02/23/2019   Lab Results  Component Value Date   TSH 3.080 02/23/2019   TSH 0.599 02/04/2018    Therapeutic Level Labs: Lab Results  Component Value Date   LITHIUM 0.74 11/01/2018   LITHIUM 0.4 (L) 08/16/2018   No results found for: VALPROATE No components found for:  CBMZ  Current Medications: Current Outpatient Medications  Medication Sig Dispense Refill  . atorvastatin (LIPITOR) 20 MG tablet Take 1 tablet (20 mg total) by mouth daily. 90 tablet 4  . benazepril (LOTENSIN) 5 MG tablet Take 1 tablet (5 mg total) by mouth daily. 90 tablet 4   . busPIRone (BUSPAR) 15 MG tablet Take 1 tablet (15 mg total) by mouth 3 (three) times daily. 270 tablet 3  . Calcium Citrate-Vitamin D (CALCIUM + D PO) Take 1 tablet by mouth 2 (two) times daily.     . diphenoxylate-atropine (LOMOTIL) 2.5-0.025 MG tablet Take 1 tablet by mouth 4 (four) times daily as needed for diarrhea or loose stools. 360 tablet 2  . fexofenadine (ALLEGRA ALLERGY) 180 MG tablet Take 1 tablet (180 mg total) by mouth daily. 30 tablet 5  .  fluticasone (FLONASE) 50 MCG/ACT nasal spray Place 2 sprays into both nostrils daily. 16 g 6  . gabapentin (NEURONTIN) 300 MG capsule Take 1 capsule (300 mg total) by mouth 3 (three) times daily. 270 capsule 2  . lithium 300 MG tablet Take 2 tablets (600 mg total) by mouth at bedtime. 180 tablet 1  . metoprolol succinate (TOPROL-XL) 25 MG 24 hr tablet Take 1 tablet (25 mg total) by mouth daily. 90 tablet 4  . metroNIDAZOLE (FLAGYL) 500 MG tablet Take 1 tablet (500 mg total) by mouth 2 (two) times daily. 14 tablet 0  . montelukast (SINGULAIR) 10 MG tablet Take 1 tablet (10 mg total) by mouth at bedtime. 30 tablet 3  . Multiple Vitamin (MULTIVITAMIN) tablet Take 1 tablet by mouth daily.    Marland Kitchen nystatin cream (MYCOSTATIN) Apply 1 application topically 2 (two) times daily. 60 g 1  . Omega-3 Fatty Acids (FISH OIL) 1000 MG CAPS Take 1,000 mg by mouth 2 (two) times daily.     Marland Kitchen oxybutynin (DITROPAN-XL) 10 MG 24 hr tablet Take 1 tablet (10 mg total) by mouth at bedtime. 90 tablet 4  . Oxymetazoline HCl (NASAL SPRAY 12 HOUR NA) Place into the nose.    Marland Kitchen QUEtiapine (SEROQUEL) 50 MG tablet Take 1 tablet (50 mg total) by mouth at bedtime. 90 tablet 1  . topiramate (TOPAMAX) 100 MG tablet Take 1.5 tablets (150 mg total) by mouth daily. 135 tablet 1  . traZODone (DESYREL) 100 MG tablet Take 1 tablet (100 mg total) by mouth at bedtime. 90 tablet 1  . venlafaxine XR (EFFEXOR-XR) 150 MG 24 hr capsule Take 1 capsule (150 mg total) by mouth daily with breakfast. 90  capsule 1   No current facility-administered medications for this visit.     Musculoskeletal: Strength & Muscle Tone: unable to assess due to telemed visit Gait & Station: unable to assess due to telemed visit Patient leans: unable to assess due to telemed visit  Psychiatric Specialty Exam: Review of Systems  There were no vitals taken for this visit.There is no height or weight on file to calculate BMI.  General Appearance: Fairly Groomed  Eye Contact:  Good  Speech:  Clear and Coherent and Normal Rate  Volume:  Normal  Mood:  Depressed  Affect:  Tearful  Thought Process:  Goal Directed, Linear and Descriptions of Associations: Intact  Orientation:  Full (Time, Place, and Person)  Thought Content: Logical   Suicidal Thoughts:  No  Homicidal Thoughts:  No  Memory:  Recent;   Good Remote;   Good  Judgement:  Fair  Insight:  Fair  Psychomotor Activity:  Normal  Concentration:  Concentration: Good and Attention Span: Good  Recall:  Good  Fund of Knowledge: Good  Language: Good  Akathisia:  Negative  Handed:  Right  AIMS (if indicated): not done  Assets:  Communication Skills Desire for Improvement Financial Resources/Insurance Housing Social Support  ADL's:  Intact  Cognition: WNL  Sleep:  Fair/ sleeping excessively   Screenings: GAD-7     Office Visit from 02/23/2019 in [facility]  Total GAD-7 Score  14    PHQ2-9     Office Visit from 02/23/2019 in [facility] from 02/09/2019 in [facility] from 02/04/2018 in [facility] Visit from 08/17/2017 in [facility] Visit from 08/05/2016 in [facility]  PHQ-2 Total Score  2  5  0  0  0  PHQ-9 Total Score  12  12  --  --  --       Assessment and Plan: Patient reported feeling depressed.  She also reported being on multiple different medications.  She was agreeable to increase the dose of Effexor for  optimal control of depression.  She was agreeable to discontinue the Seroquel and see how she does.  She was also going to try to discontinue Topamax in a few days.   1. Bipolar 1 disorder, depressed, moderate ([facility])  - traZODone (DESYREL) 100 MG tablet; Take 1 tablet (100 mg total) by mouth at bedtime.  Dispense: 90 tablet; Refill: 0 - lithium 300 MG tablet; Take 2 tablets (600 mg total) by mouth at bedtime.  Dispense: 180 tablet; Refill: 0 -Increase venlafaxine XR (EFFEXOR XR) 75 MG 24 hr capsule; Take 3 capsules daily  Dispense: 270 capsule; Refill: 0 - gabapentin (NEURONTIN) 300 MG capsule; Take 1 capsule (300 mg total) by mouth 3 (three) times daily.  Dispense: 270 capsule; Refill: 0  2. Anxiety  - venlafaxine XR (EFFEXOR XR) 75 MG 24 hr capsule; Take 3 capsules daily  Dispense: 270 capsule; Refill: 0 - gabapentin (NEURONTIN) 300 MG capsule; Take 1 capsule (300 mg total) by mouth 3 (three) times daily.  Dispense: 270 capsule; Refill: 0  Follow-up in 2 months.  [place] Crane, MD 08/29/2019, 3:09 PM

## 2019-08-30 NOTE — Assessment & Plan Note (Signed)
BPs stable and WNL, continue current regimen 

## 2019-08-30 NOTE — Assessment & Plan Note (Signed)
Continue good allergy regimen, work on consistent use

## 2019-08-30 NOTE — Assessment & Plan Note (Signed)
Followed closely by Psychiatry. Continue per their recommendation

## 2019-08-30 NOTE — Assessment & Plan Note (Signed)
Recheck lipids, adjust as needed. Continue working on diet and exercise habits.

## 2019-09-01 ENCOUNTER — Encounter: Payer: Self-pay | Admitting: Family Medicine

## 2019-09-01 LAB — UA/M W/RFLX CULTURE, ROUTINE
Bilirubin, UA: NEGATIVE
Glucose, UA: NEGATIVE
Ketones, UA: NEGATIVE
Nitrite, UA: NEGATIVE
Protein,UA: NEGATIVE
Specific Gravity, UA: 1.015 (ref 1.005–1.030)
Urobilinogen, Ur: 0.2 mg/dL (ref 0.2–1.0)
pH, UA: 5.5 (ref 5.0–7.5)

## 2019-09-01 LAB — URINE CULTURE, REFLEX

## 2019-09-01 LAB — MICROSCOPIC EXAMINATION: Bacteria, UA: NONE SEEN

## 2019-09-11 ENCOUNTER — Telehealth: Payer: Self-pay

## 2019-09-11 NOTE — Telephone Encounter (Signed)
Patient notified of lab results.  Copied from [facility] [phone]. Topic: General - Other >> Sep 11, 2019  1:43 PM Greggory Keen D wrote: Reason for CRM: pt would like a nurse to call her back about her labs from 08/25/19 and her sleep study results. CB#  [phone]

## 2019-10-25 ENCOUNTER — Encounter: Payer: Self-pay | Admitting: Psychiatry

## 2019-10-25 ENCOUNTER — Other Ambulatory Visit: Payer: Self-pay

## 2019-10-25 ENCOUNTER — Other Ambulatory Visit: Payer: Self-pay | Admitting: Family Medicine

## 2019-10-25 ENCOUNTER — Ambulatory Visit (INDEPENDENT_AMBULATORY_CARE_PROVIDER_SITE_OTHER): Payer: Medicare PPO | Admitting: Psychiatry

## 2019-10-25 DIAGNOSIS — F3176 Bipolar disorder, in full remission, most recent episode depressed: Secondary | ICD-10-CM | POA: Diagnosis not present

## 2019-10-25 DIAGNOSIS — F419 Anxiety disorder, unspecified: Secondary | ICD-10-CM | POA: Diagnosis not present

## 2019-10-25 DIAGNOSIS — Z1231 Encounter for screening mammogram for malignant neoplasm of breast: Secondary | ICD-10-CM

## 2019-10-25 DIAGNOSIS — F3132 Bipolar disorder, current episode depressed, moderate: Secondary | ICD-10-CM | POA: Diagnosis not present

## 2019-10-25 MED ORDER — TRAZODONE HCL 100 MG PO TABS: ORAL_TABLET | ORAL | 0 refills | Status: DC

## 2019-10-25 MED ORDER — BUSPIRONE HCL 15 MG PO TABS: 15.0000 mg | ORAL_TABLET | Freq: Three times a day (TID) | ORAL | 0 refills | Status: DC

## 2019-10-25 MED ORDER — LITHIUM CARBONATE 300 MG PO TABS: 600.0000 mg | ORAL_TABLET | Freq: Every day | ORAL | 0 refills | Status: DC

## 2019-10-25 MED ORDER — VENLAFAXINE HCL ER 75 MG PO CP24: ORAL_CAPSULE | ORAL | 0 refills | Status: DC

## 2019-10-25 NOTE — Progress Notes (Signed)
[facility] MD OP Progress Note  I connected with  Elizabeth Malone on 10/25/19 by a video enabled telemedicine application and verified that I am speaking with the correct person using two identifiers.   I discussed the limitations of evaluation and management by telemedicine. The patient expressed understanding and agreed to proceed.    10/25/2019 9:24 AM Elizabeth Malone  MRN:  [id number]  Chief Complaint:  " I am doing much better now."  HPI: Patient stated that she could tell an immediate improvement in her mood after she increase the dose of Effexor to 225 mg daily.  She felt her anxiety and depressive symptoms were much well controlled.  She stopped taking Topamax however she started having rebound headaches so she tapered the dose down gradually and that was helpful.  She is no longer on Topamax anymore.  She also stopped taking Seroquel however as result she has not been sleeping well.  She has been taking trazodone 100 mg regularly and wants to see if she can increase the dose to see if that would help her better.  She informed that her daughter also takes trazodone 200 mg for sleep and does well on it. She stated that she wants to continue taking buspirone as she has been informed many many years and it really helps her with her social anxiety. She stated that she had to unexpectedly lead all of the online Zoom meetings a few weeks ago and she was quite anxious and shaky during that session.  However she did accomplish what she wanted to accomplish. She stated that she is now looking forward to hanging out with her friend going for lunch.  She got her first dose of COVID-19 vaccine yesterday and is waiting to get her second dose.  She also has made plans to see her hairdresser after a long time. Overall she is doing much better and feels she is getting closer to where she would like to be.  Visit Diagnosis:    ICD-10-CM   1. Bipolar 1 disorder, depressed, full remission ([place])  F31.76   2. Anxiety   F41.9     Past Psychiatric History: Bipolar disorder, anxiety  Past Medical History:  Past Medical History:  Diagnosis Date  . Anxiety   . Bipolar disorder ([place])   . CKD (chronic kidney disease) stage 3, GFR 30-59 ml/min   . Complication of anesthesia   . Depression   . GERD (gastroesophageal reflux disease)    occasionally has to use tums  . Headache   . History of diverticulosis   . Hyperlipidemia   . Overactive bladder   . PONV (postoperative nausea and vomiting)   . Substance abuse ([place])    alcohol    Past Surgical History:  Procedure Laterality Date  . COLONOSCOPY WITH PROPOFOL N/A 10/07/2017   Procedure: COLONOSCOPY WITH PROPOFOL;  Surgeon: [name], MD;  Location: [facility];  Service: Endoscopy;  Laterality: N/A;  . FOOT SURGERY Bilateral   . KNEE SURGERY Left   . POLYPECTOMY  10/07/2017   Procedure: POLYPECTOMY;  Surgeon: [name], MD;  Location: [facility];  Service: Endoscopy;;  . TONSILLECTOMY AND ADENOIDECTOMY      Family Psychiatric History: see below   Family History:  Family History  Problem Relation Age of Onset  . Alzheimer's disease Mother   . Bipolar disorder Mother   . Depression Mother   . Heart attack Father   . Diabetes Brother   . Obesity Brother   .  Depression Maternal Grandfather   . Depression Daughter   . Stroke Maternal Grandmother   . Stroke Paternal Grandmother   . Heart attack Paternal Grandfather   . Breast cancer Neg Hx     Social History:  Social History   Socioeconomic History  . Marital status: Married    Spouse name: Not on file  . Number of children: 2  . Years of education: Not on file  . Highest education level: Bachelor's degree (e.g., BA, AB, BS)  Occupational History  . Occupation: retired   Tobacco Use  . Smoking status: Former Smoker    Types: Cigarettes    Start date: 07/09/1967    Quit date: 07/08/1985    Years since quitting: 34.3  . Smokeless tobacco: Never Used   Substance and Sexual Activity  . Alcohol use: Yes    Alcohol/week: 3.0 standard drinks    Types: 3 Shots of liquor per week    Comment: occassional  . Drug use: No  . Sexual activity: Not Currently  Other Topics Concern  . Not on file  Social History Narrative  . Not on file   Social Determinants of Health   Financial Resource Strain:   . Difficulty of Paying Living Expenses: Not on file  Food Insecurity:   . Worried About Charity fundraiser in the Last Year: Not on file  . Ran Out of Food in the Last Year: Not on file  Transportation Needs:   . Lack of Transportation (Medical): Not on file  . Lack of Transportation (Non-Medical): Not on file  Physical Activity:   . Days of Exercise per Week: Not on file  . Minutes of Exercise per Session: Not on file  Stress:   . Feeling of Stress : Not on file  Social Connections:   . Frequency of Communication with Friends and Family: Not on file  . Frequency of Social Gatherings with Friends and Family: Not on file  . Attends Religious Services: Not on file  . Active Member of Clubs or Organizations: Not on file  . Attends Archivist Meetings: Not on file  . Marital Status: Not on file    Allergies:  Allergies  Allergen Reactions  . Ace Inhibitors Other (See Comments)  . Fluoxetine Other (See Comments)  . Hydrocodone-Chlorpheniramine Other (See Comments)  . Paroxetine Hcl Other (See Comments)  . Penicillin G Benzathine Other (See Comments)  . Penicillin V Potassium Other (See Comments)  . Clindamycin Hcl Rash and Other (See Comments)    Katherina Right' syndrome  . Lamotrigine Rash    Metabolic Disorder Labs: Lab Results  Component Value Date   HGBA1C 4.9 08/17/2017   No results found for: PROLACTIN Lab Results  Component Value Date   CHOL 208 (H) 08/25/2019   TRIG 254 (H) 08/25/2019   HDL 63 08/25/2019   CHOLHDL 3.2 02/23/2019   VLDL 39 (H) 08/17/2017   LDLCALC 102 (H) 08/25/2019   LDLCALC 112 (H)  02/23/2019   Lab Results  Component Value Date   TSH 3.080 02/23/2019   TSH 0.599 02/04/2018    Therapeutic Level Labs: Lab Results  Component Value Date   LITHIUM 0.74 11/01/2018   LITHIUM 0.4 (L) 08/16/2018   No results found for: VALPROATE No components found for:  CBMZ  Current Medications: Current Outpatient Medications  Medication Sig Dispense Refill  . atorvastatin (LIPITOR) 20 MG tablet Take 1 tablet (20 mg total) by mouth daily. 90 tablet 4  . benazepril (LOTENSIN)  5 MG tablet Take 1 tablet (5 mg total) by mouth daily. 90 tablet 4  . busPIRone (BUSPAR) 15 MG tablet Take 1 tablet (15 mg total) by mouth 3 (three) times daily. 270 tablet 3  . Calcium Citrate-Vitamin D (CALCIUM + D PO) Take 1 tablet by mouth 2 (two) times daily.     . diphenoxylate-atropine (LOMOTIL) 2.5-0.025 MG tablet Take 1 tablet by mouth 4 (four) times daily as needed for diarrhea or loose stools. 360 tablet 2  . fexofenadine (ALLEGRA ALLERGY) 180 MG tablet Take 1 tablet (180 mg total) by mouth daily. 30 tablet 5  . fluticasone (FLONASE) 50 MCG/ACT nasal spray Place 2 sprays into both nostrils daily. 16 g 6  . gabapentin (NEURONTIN) 300 MG capsule Take 1 capsule (300 mg total) by mouth 3 (three) times daily. 270 capsule 0  . lithium 300 MG tablet Take 2 tablets (600 mg total) by mouth at bedtime. 180 tablet 0  . metoprolol succinate (TOPROL-XL) 25 MG 24 hr tablet Take 1 tablet (25 mg total) by mouth daily. 90 tablet 4  . metroNIDAZOLE (FLAGYL) 500 MG tablet Take 1 tablet (500 mg total) by mouth 2 (two) times daily. 14 tablet 0  . montelukast (SINGULAIR) 10 MG tablet Take 1 tablet (10 mg total) by mouth at bedtime. 30 tablet 3  . Multiple Vitamin (MULTIVITAMIN) tablet Take 1 tablet by mouth daily.    Marland Kitchen nystatin cream (MYCOSTATIN) Apply 1 application topically 2 (two) times daily. 60 g 1  . Omega-3 Fatty Acids (FISH OIL) 1000 MG CAPS Take 1,000 mg by mouth 2 (two) times daily.     Marland Kitchen oxybutynin (DITROPAN-XL)  10 MG 24 hr tablet Take 1 tablet (10 mg total) by mouth at bedtime. 90 tablet 4  . Oxymetazoline HCl (NASAL SPRAY 12 HOUR NA) Place into the nose.    Marland Kitchen QUEtiapine (SEROQUEL) 50 MG tablet Take 1 tablet (50 mg total) by mouth at bedtime. 90 tablet 1  . topiramate (TOPAMAX) 100 MG tablet Take 1.5 tablets (150 mg total) by mouth daily. 135 tablet 1  . traZODone (DESYREL) 100 MG tablet Take 1 tablet (100 mg total) by mouth at bedtime. 90 tablet 0  . venlafaxine XR (EFFEXOR XR) 75 MG 24 hr capsule Take 3 capsules daily 270 capsule 0   No current facility-administered medications for this visit.     Musculoskeletal: Strength & Muscle Tone: unable to assess due to telemed visit Gait & Station: unable to assess due to telemed visit Patient leans: unable to assess due to telemed visit  Psychiatric Specialty Exam: Review of Systems  There were no vitals taken for this visit.There is no height or weight on file to calculate BMI.  General Appearance: Fairly Groomed  Eye Contact:  Good  Speech:  Clear and Coherent and Normal Rate  Volume:  Normal  Mood:  Euthymic  Affect:  Congruent  Thought Process:  Goal Directed, Linear and Descriptions of Associations: Intact  Orientation:  Full (Time, Place, and Person)  Thought Content: Logical   Suicidal Thoughts:  No  Homicidal Thoughts:  No  Memory:  Recent;   Good Remote;   Good  Judgement:  Fair  Insight:  Fair  Psychomotor Activity:  Normal  Concentration:  Concentration: Good and Attention Span: Good  Recall:  Good  Fund of Knowledge: Good  Language: Good  Akathisia:  Negative  Handed:  Right  AIMS (if indicated): not done  Assets:  Communication Skills Desire for Improvement Financial Resources/Insurance Housing  Social Support  ADL's:  Intact  Cognition: WNL  Sleep:  Fair   Screenings: GAD-7     [facility] Visit from 02/23/2019 in [facility]  Total GAD-7 Score  14    PHQ2-9     Office Visit from 02/23/2019 in [facility] from 02/09/2019 in [facility] from 02/04/2018 in [facility] Visit from 08/17/2017 in [facility] Visit from 08/05/2016 in [facility]  PHQ-2 Total Score  2  5  0  0  0  PHQ-9 Total Score  12  12  --  --  --       Assessment and Plan: Patient reported improvement in her mood after Effexor dose was adjusted.  However she is having difficulty in staying asleep with trazodone 100 mg and would like to try higher dose for optimal effect.  1. Bipolar 1 disorder, depressed, full remission ([facility])  - venlafaxine XR (EFFEXOR XR) 75 MG 24 hr capsule; Take 3 capsules daily  Dispense: 270 capsule; Refill: 0 - lithium 300 MG tablet; Take 2 tablets (600 mg total) by mouth at bedtime.  Dispense: 180 tablet; Refill: 0 - Increase traZODone (DESYREL) 100 MG tablet; Take 2 tablets at bedtime as needed for sleep  Dispense: 180 tablet; Refill: 0  2. Anxiety  - venlafaxine XR (EFFEXOR XR) 75 MG 24 hr capsule; Take 3 capsules daily  Dispense: 270 capsule; Refill: 0 - busPIRone (BUSPAR) 15 MG tablet; Take 1 tablet (15 mg total) by mouth 3 (three) times daily.  Dispense: 270 tablet; Refill: 0  Continue individual therapy. Follow-up in 2 months.  [place] Crane, MD 10/25/2019, 9:24 AM

## 2019-10-31 ENCOUNTER — Telehealth: Payer: Self-pay | Admitting: Family Medicine

## 2019-10-31 NOTE — Telephone Encounter (Signed)
Needs appt

## 2019-10-31 NOTE — Telephone Encounter (Signed)
Lvm for medication refills

## 2019-10-31 NOTE — Telephone Encounter (Signed)
Lvm to make an apt for medication refills *

## 2019-10-31 NOTE — Telephone Encounter (Signed)
Pt stated she has been on oxybutynin (DITROPAN-XL) 10 MG 24 hr tablet Due to leaking bladder but it is no longer working like it used to/ Pt wants to ask Apolonio Schneiders if there is another option or if she can double the dose of the medication and if that may work better/ please advise

## 2019-11-01 NOTE — Telephone Encounter (Signed)
Called Pt to make this apt, pt requested a virtual visit.Appointment scheduled for on 11/02/19.

## 2019-11-02 ENCOUNTER — Telehealth (INDEPENDENT_AMBULATORY_CARE_PROVIDER_SITE_OTHER): Payer: Self-pay | Admitting: Family Medicine

## 2019-11-02 ENCOUNTER — Encounter: Payer: Self-pay | Admitting: Family Medicine

## 2019-11-02 VITALS — Ht 65.0 in | Wt 202.0 lb

## 2019-11-02 DIAGNOSIS — N3281 Overactive bladder: Secondary | ICD-10-CM

## 2019-11-02 NOTE — Progress Notes (Signed)
Ht 5\' 5"  (1.651 m)   Wt 202 lb (91.6 kg)   BMI 33.61 kg/m    Subjective:    Patient ID: Elizabeth Malone, female    DOB: June 22, 1951, 69 y.o.   MRN: [id number]  HPI: Elizabeth Malone is a 69 y.o. female  Chief Complaint  Patient presents with  . Urinary Incontinence    . This visit was completed via WebEx due to the restrictions of the COVID-19 pandemic. All issues as above were discussed and addressed. Physical exam was done as above through visual confirmation on WebEx. If it was felt that the patient should be evaluated in the office, they were directed there. The patient verbally consented to this visit. . Location of the patient: home . Location of the provider: home . Those involved with this call:  . Provider: Merrie Roof, PA-C . CMA: Lesle Chris, [place] . Front Desk/Registration: Jill Side  . Time spent on call: 15 minutes with patient face to face via video conference. More than 50% of this time was spent in counseling and coordination of care. 5 minutes total spent in review of patient's record and preparation of their chart. I verified patient identity using two factors (patient name and date of birth). Patient consents verbally to being seen via telemedicine visit today.   Feeling like her ditropan is not working as well anymore the past few months for overactive bladder. If bending, sneezing, coughing, seeing water running will have leaking urine. Having to use several pads per day. States years ago the medicine used to work really well but over time lessening efficacy. Denies dysuria, N/V, abdominal pain, fevers.   Relevant past medical, surgical, family and social history reviewed and updated as indicated. Interim medical history since our last visit reviewed. Allergies and medications reviewed and updated.  Review of Systems  Per HPI unless specifically indicated above     Objective:    Ht 5\' 5"  (1.651 m)   Wt 202 lb (91.6 kg)   BMI 33.61 kg/m   Wt Readings  from Last 3 Encounters:  11/02/19 202 lb (91.6 kg)  08/25/19 202 lb (91.6 kg)  02/23/19 193 lb (87.5 kg)    Physical Exam Vitals and nursing note reviewed.  Constitutional:      General: She is not in acute distress.    Appearance: Normal appearance.  HENT:     Head: Atraumatic.     Right Ear: External ear normal.     Left Ear: External ear normal.     Nose: Nose normal. No congestion.     Mouth/Throat:     Mouth: Mucous membranes are moist.     Pharynx: Oropharynx is clear. No posterior oropharyngeal erythema.  Eyes:     Extraocular Movements: Extraocular movements intact.     Conjunctiva/sclera: Conjunctivae normal.  Cardiovascular:     Comments: Unable to assess via virtual visit Pulmonary:     Effort: Pulmonary effort is normal. No respiratory distress.  Musculoskeletal:        General: Normal range of motion.     Cervical back: Normal range of motion.  Skin:    General: Skin is dry.     Findings: No erythema.  Neurological:     Mental Status: She is alert and oriented to person, place, and time.  Psychiatric:        Mood and Affect: Mood normal.        Thought Content: Thought content normal.  Judgment: Judgment normal.     Results for orders placed or performed in visit on 08/25/19  WET PREP FOR [place], YEAST, CLUE   Specimen: Vaginal; Sterile Swab   STERILE SWAB  Result Value Ref Range   Trichomonas Exam Negative Negative   Yeast Exam Negative Negative   Clue Cell Exam Positive (A) Negative  Microscopic Examination   URINE  Result Value Ref Range   WBC, UA 0-5 0 - 5 /hpf   RBC 0-2 0 - 2 /hpf   Epithelial Cells (non renal) 0-10 0 - 10 /hpf   Bacteria, UA None seen None seen/Few  Urine Culture, Reflex   URINE  Result Value Ref Range   Urine Culture, Routine Final report (A)    Organism ID, Bacteria Comment (A)   Comprehensive metabolic panel  Result Value Ref Range   Glucose 96 65 - 99 mg/dL   BUN 18 8 - 27 mg/dL   Creatinine, Ser 1.06 (H)  0.57 - 1.00 mg/dL   GFR calc non Af Amer 54 (L) >59 mL/min/1.73   GFR calc Af Amer 62 >59 mL/min/1.73   BUN/Creatinine Ratio 17 12 - 28   Sodium 139 134 - 144 mmol/L   Potassium 4.4 3.5 - 5.2 mmol/L   Chloride 104 96 - 106 mmol/L   CO2 20 20 - 29 mmol/L   Calcium 9.7 8.7 - 10.3 mg/dL   Total Protein 6.7 6.0 - 8.5 g/dL   Albumin 4.7 3.8 - 4.8 g/dL   Globulin, Total 2.0 1.5 - 4.5 g/dL   Albumin/Globulin Ratio 2.4 (H) 1.2 - 2.2   Bilirubin Total 0.3 0.0 - 1.2 mg/dL   Alkaline Phosphatase 91 39 - 117 IU/L   AST 19 0 - 40 IU/L   ALT 24 0 - 32 IU/L  CBC with Differential/Platelet out  Result Value Ref Range   WBC 5.5 3.4 - 10.8 x10E3/uL   RBC 4.41 3.77 - 5.28 x10E6/uL   Hemoglobin 14.3 11.1 - 15.9 g/dL   Hematocrit 41.6 34.0 - 46.6 %   MCV 94 79 - 97 fL   MCH 32.4 26.6 - 33.0 pg   MCHC 34.4 31.5 - 35.7 g/dL   RDW 11.5 (L) 11.7 - 15.4 %   Platelets 199 150 - 450 x10E3/uL   Neutrophils 57 Not Estab. %   Lymphs 30 Not Estab. %   Monocytes 8 Not Estab. %   Eos 4 Not Estab. %   Basos 1 Not Estab. %   Neutrophils Absolute 3.2 1.4 - 7.0 x10E3/uL   Lymphocytes Absolute 1.6 0.7 - 3.1 x10E3/uL   Monocytes Absolute 0.4 0.1 - 0.9 x10E3/uL   EOS (ABSOLUTE) 0.2 0.0 - 0.4 x10E3/uL   Basophils Absolute 0.0 0.0 - 0.2 x10E3/uL   Immature Granulocytes 0 Not Estab. %   Immature Grans (Abs) 0.0 0.0 - 0.1 x10E3/uL  Lipid Panel w/o Chol/HDL Ratio out  Result Value Ref Range   Cholesterol, Total 208 (H) 100 - 199 mg/dL   Triglycerides 254 (H) 0 - 149 mg/dL   HDL 63 >39 mg/dL   VLDL Cholesterol Cal 43 (H) 5 - 40 mg/dL   LDL Chol Calc (NIH) 102 (H) 0 - 99 mg/dL  UA/M w/rflx Culture, Routine   Specimen: Urine   URINE  Result Value Ref Range   Specific Gravity, UA 1.015 1.005 - 1.030   pH, UA 5.5 5.0 - 7.5   Color, UA Yellow Yellow   Appearance Ur Clear Clear   Leukocytes,UA 2+ (A)  Negative   Protein,UA Negative Negative/Trace   Glucose, UA Negative Negative   Ketones, UA Negative Negative    RBC, UA Trace (A) Negative   Bilirubin, UA Negative Negative   Urobilinogen, Ur 0.2 0.2 - 1.0 mg/dL   Nitrite, UA Negative Negative   Microscopic Examination See below:    Urinalysis Reflex Comment       Assessment & Plan:   Problem List Items Addressed This Visit      Genitourinary   Overactive bladder - Primary    Just received a 90 day supply on her ditropan and wishes to try doubling her dose prior to making any changes so as not to waste these pills. Will try taking 2 tabs daily of what she has and if no benefit she will message via mychart and we can try detrol LA as this helped her in the past.           Follow up plan: Return for as scheduled.

## 2019-11-02 NOTE — Assessment & Plan Note (Signed)
Just received a 90 day supply on her ditropan and wishes to try doubling her dose prior to making any changes so as not to waste these pills. Will try taking 2 tabs daily of what she has and if no benefit she will message via mychart and we can try detrol LA as this helped her in the past.

## 2019-11-28 ENCOUNTER — Telehealth: Payer: Self-pay

## 2019-11-28 NOTE — Telephone Encounter (Signed)
Patient called and stated that her sleep is very disrupted. She sleep from 9pm to about 2am. She lays in bed to try to go back to sleep. Then she goes back to sleep from 6am to about 12noon. She would like to know what she can do about this. She stated that she was wondering about going back on Seroquel. Please review and advise. Thank you.

## 2019-11-29 ENCOUNTER — Other Ambulatory Visit: Payer: Self-pay

## 2019-11-29 ENCOUNTER — Encounter: Payer: Self-pay | Admitting: Psychiatry

## 2019-11-29 ENCOUNTER — Ambulatory Visit (INDEPENDENT_AMBULATORY_CARE_PROVIDER_SITE_OTHER): Payer: Medicare PPO | Admitting: Psychiatry

## 2019-11-29 DIAGNOSIS — F419 Anxiety disorder, unspecified: Secondary | ICD-10-CM

## 2019-11-29 DIAGNOSIS — F3176 Bipolar disorder, in full remission, most recent episode depressed: Secondary | ICD-10-CM | POA: Diagnosis not present

## 2019-11-29 MED ORDER — TEMAZEPAM 15 MG PO CAPS: 15.0000 mg | ORAL_CAPSULE | Freq: Every evening | ORAL | 1 refills | Status: DC | PRN

## 2019-11-29 NOTE — Telephone Encounter (Signed)
Can you please offer her appointment with me at 4 pm today to discuss this further? Thanks.

## 2019-11-29 NOTE — Progress Notes (Signed)
[facility] MD OP Progress Note  I connected with  Elizabeth Malone on 11/29/19 by a video enabled telemedicine application and verified that I am speaking with the correct person using two identifiers.   I discussed the limitations of evaluation and management by telemedicine. The patient expressed understanding and agreed to proceed.    11/29/2019 4:04 PM Elizabeth Malone  MRN:  [id number]  Chief Complaint:  " I am not sleeping well at all."  HPI: Patient had called earlier to report that she has been having difficulty with sleep.  She has been staying up most of the night and then sleeping during the daytime.  She is really concerned about this and wanted to discuss this.  Patient was offered an appointment at this time. Patient reported that lately even with 200 mg of trazodone she is waking up around 130 or 2 AM and then she will stay up and then she will go back to sleep around 7 or 8 AM and then wake up around lunchtime.  She stated that she even tried taking 1 tablet of trazodone at her usual bedtime and then taking the second dose at 1:30 AM however that has not been helpful. She asked if she can go back to Seroquel as it did help her with sleep.  Patient was reminded that Seroquel caused increase in appetite and resultant weight gain.  Patient recall that and asked for another option. She also mentioned that she is spoken to her therapist about CBT for insomnia and her therapist informed her that she is undergoing some training and will provide her with some tips next time. Patient was offered temazepam. Potential side effects of medication and risks vs benefits of treatment vs non-treatment were explained and discussed. All questions were answered. Patient was agreeable to try it. She reported her mood has been stable.  She is planning to spend the weekend with her daughter in [place].  Visit Diagnosis:    ICD-10-CM   1. Bipolar 1 disorder, depressed, full remission ([facility])  F31.76   2. Anxiety   F41.9     Past Psychiatric History: Bipolar disorder, anxiety  Past Medical History:  Past Medical History:  Diagnosis Date  . Anxiety   . Bipolar disorder ([place])   . CKD (chronic kidney disease) stage 3, GFR 30-59 ml/min   . Complication of anesthesia   . Depression   . GERD (gastroesophageal reflux disease)    occasionally has to use tums  . Headache   . History of diverticulosis   . Hyperlipidemia   . Overactive bladder   . PONV (postoperative nausea and vomiting)   . Substance abuse ([place])    alcohol    Past Surgical History:  Procedure Laterality Date  . COLONOSCOPY WITH PROPOFOL N/A 10/07/2017   Procedure: COLONOSCOPY WITH PROPOFOL;  Surgeon: [name], MD;  Location: [facility];  Service: Endoscopy;  Laterality: N/A;  . FOOT SURGERY Bilateral   . KNEE SURGERY Left   . POLYPECTOMY  10/07/2017   Procedure: POLYPECTOMY;  Surgeon: [name], MD;  Location: [facility];  Service: Endoscopy;;  . TONSILLECTOMY AND ADENOIDECTOMY      Family Psychiatric History: see below   Family History:  Family History  Problem Relation Age of Onset  . Alzheimer's disease Mother   . Bipolar disorder Mother   . Depression Mother   . Heart attack Father   . Diabetes Brother   . Obesity Brother   . Depression Maternal Grandfather   .  Depression Daughter   . Stroke Maternal Grandmother   . Stroke Paternal Grandmother   . Heart attack Paternal Grandfather   . Breast cancer Neg Hx     Social History:  Social History   Socioeconomic History  . Marital status: Married    Spouse name: Not on file  . Number of children: 2  . Years of education: Not on file  . Highest education level: Bachelor's degree (e.g., BA, AB, BS)  Occupational History  . Occupation: retired   Tobacco Use  . Smoking status: Former Smoker    Types: Cigarettes    Start date: 07/09/1967    Quit date: 07/08/1985    Years since quitting: 34.4  . Smokeless tobacco: Never Used   Substance and Sexual Activity  . Alcohol use: Yes    Alcohol/week: 3.0 standard drinks    Types: 3 Shots of liquor per week    Comment: occassional  . Drug use: No  . Sexual activity: Not Currently  Other Topics Concern  . Not on file  Social History Narrative  . Not on file   Social Determinants of Health   Financial Resource Strain:   . Difficulty of Paying Living Expenses:   Food Insecurity:   . Worried About Charity fundraiser in the Last Year:   . Arboriculturist in the Last Year:   Transportation Needs:   . Film/video editor (Medical):   Marland Kitchen Lack of Transportation (Non-Medical):   Physical Activity:   . Days of Exercise per Week:   . Minutes of Exercise per Session:   Stress:   . Feeling of Stress :   Social Connections:   . Frequency of Communication with Friends and Family:   . Frequency of Social Gatherings with Friends and Family:   . Attends Religious Services:   . Active Member of Clubs or Organizations:   . Attends Archivist Meetings:   Marland Kitchen Marital Status:     Allergies:  Allergies  Allergen Reactions  . Ace Inhibitors Other (See Comments)  . Fluoxetine Other (See Comments)  . Hydrocodone-Chlorpheniramine Other (See Comments)  . Paroxetine Hcl Other (See Comments)  . Penicillin G Benzathine Other (See Comments)  . Penicillin V Potassium Other (See Comments)  . Clindamycin Hcl Rash and Other (See Comments)    Katherina Right' syndrome  . Lamotrigine Rash    Metabolic Disorder Labs: Lab Results  Component Value Date   HGBA1C 4.9 08/17/2017   No results found for: PROLACTIN Lab Results  Component Value Date   CHOL 208 (H) 08/25/2019   TRIG 254 (H) 08/25/2019   HDL 63 08/25/2019   CHOLHDL 3.2 02/23/2019   VLDL 39 (H) 08/17/2017   LDLCALC 102 (H) 08/25/2019   LDLCALC 112 (H) 02/23/2019   Lab Results  Component Value Date   TSH 3.080 02/23/2019   TSH 0.599 02/04/2018    Therapeutic Level Labs: Lab Results  Component Value  Date   LITHIUM 0.74 11/01/2018   LITHIUM 0.4 (L) 08/16/2018   No results found for: VALPROATE No components found for:  CBMZ  Current Medications: Current Outpatient Medications  Medication Sig Dispense Refill  . atorvastatin (LIPITOR) 20 MG tablet Take 1 tablet (20 mg total) by mouth daily. 90 tablet 4  . benazepril (LOTENSIN) 5 MG tablet Take 1 tablet (5 mg total) by mouth daily. 90 tablet 4  . busPIRone (BUSPAR) 15 MG tablet Take 1 tablet (15 mg total) by mouth 3 (three) times daily.  270 tablet 0  . Calcium Citrate-Vitamin D (CALCIUM + D PO) Take 1 tablet by mouth 2 (two) times daily.     . diphenoxylate-atropine (LOMOTIL) 2.5-0.025 MG tablet Take 1 tablet by mouth 4 (four) times daily as needed for diarrhea or loose stools. 360 tablet 2  . fexofenadine (ALLEGRA ALLERGY) 180 MG tablet Take 1 tablet (180 mg total) by mouth daily. 30 tablet 5  . fluticasone (FLONASE) 50 MCG/ACT nasal spray Place 2 sprays into both nostrils daily. 16 g 6  . gabapentin (NEURONTIN) 300 MG capsule Take 1 capsule (300 mg total) by mouth 3 (three) times daily. 270 capsule 0  . lithium 300 MG tablet Take 2 tablets (600 mg total) by mouth at bedtime. 180 tablet 0  . metoprolol succinate (TOPROL-XL) 25 MG 24 hr tablet Take 1 tablet (25 mg total) by mouth daily. 90 tablet 4  . montelukast (SINGULAIR) 10 MG tablet Take 1 tablet (10 mg total) by mouth at bedtime. 30 tablet 3  . Multiple Vitamin (MULTIVITAMIN) tablet Take 1 tablet by mouth daily.    . Omega-3 Fatty Acids (FISH OIL) 1000 MG CAPS Take 1,000 mg by mouth 2 (two) times daily.     Marland Kitchen oxybutynin (DITROPAN-XL) 10 MG 24 hr tablet Take 1 tablet (10 mg total) by mouth at bedtime. 90 tablet 4  . Oxymetazoline HCl (NASAL SPRAY 12 HOUR NA) Place into the nose.    . traZODone (DESYREL) 100 MG tablet Take 2 tablets at bedtime as needed for sleep 180 tablet 0  . venlafaxine XR (EFFEXOR XR) 75 MG 24 hr capsule Take 3 capsules daily 270 capsule 0   No current  facility-administered medications for this visit.     Musculoskeletal: Strength & Muscle Tone: unable to assess due to telemed visit Gait & Station: unable to assess due to telemed visit Patient leans: unable to assess due to telemed visit  Psychiatric Specialty Exam: Review of Systems  There were no vitals taken for this visit.There is no height or weight on file to calculate BMI.  General Appearance: Fairly Groomed  Eye Contact:  Good  Speech:  Clear and Coherent and Normal Rate  Volume:  Normal  Mood:  Euthymic  Affect:  Congruent  Thought Process:  Goal Directed, Linear and Descriptions of Associations: Intact  Orientation:  Full (Time, Place, and Person)  Thought Content: Logical   Suicidal Thoughts:  No  Homicidal Thoughts:  No  Memory:  Recent;   Good Remote;   Good  Judgement:  Fair  Insight:  Fair  Psychomotor Activity:  Normal  Concentration:  Concentration: Good and Attention Span: Good  Recall:  Good  Fund of Knowledge: Good  Language: Good  Akathisia:  Negative  Handed:  Right  AIMS (if indicated): not done  Assets:  Communication Skills Desire for Improvement Financial Resources/Insurance Housing Social Support  ADL's:  Intact  Cognition: WNL  Sleep:  Poor   Screenings: GAD-7     Office Visit from 02/23/2019 in [facility]  Total GAD-7 Score  14    PHQ2-9     Office Visit from 02/23/2019 in [facility] from 02/09/2019 in [facility] from 02/04/2018 in [facility] Visit from 08/17/2017 in [facility] Visit from 08/05/2016 in [facility]  PHQ-2 Total Score  2  5  0  0  0  PHQ-9 Total Score  12  12  --  --  --  Assessment and Plan: Patient reported having difficulty in staying asleep with the help of trazodone the dose of which was increased to 200 last time.  Patient was agreeable to trying temazepam for insomnia. Potential  side effects of medication and risks vs benefits of treatment vs non-treatment were explained and discussed. All questions were answered.    1. Bipolar 1 disorder, depressed, full remission (HCC)  - venlafaxine XR (EFFEXOR XR) 75 MG 24 hr capsule; Take 3 capsules daily  Dispense: 270 capsule; Refill: 0 - lithium 300 MG tablet; Take 2 tablets (600 mg total) by mouth at bedtime.  Dispense: 180 tablet; Refill: 0 -Discontinue trazodone. -Start temazepam 15 mg at bedtime  2. Anxiety  - venlafaxine XR (EFFEXOR XR) 75 MG 24 hr capsule; Take 3 capsules daily  Dispense: 270 capsule; Refill: 0 - busPIRone (BUSPAR) 15 MG tablet; Take 1 tablet (15 mg total) by mouth 3 (three) times daily.  Dispense: 270 tablet; Refill: 0  Continue individual therapy. Follow-up in 2 months.  Nevada Crane, MD 11/29/2019, 4:04 PM

## 2019-11-30 ENCOUNTER — Ambulatory Visit: Payer: Medicare PPO | Admitting: Psychiatry

## 2019-12-05 ENCOUNTER — Other Ambulatory Visit: Payer: Self-pay | Admitting: Family Medicine

## 2019-12-13 ENCOUNTER — Telehealth: Payer: Self-pay | Admitting: Family Medicine

## 2019-12-13 MED ORDER — MONTELUKAST SODIUM 10 MG PO TABS: 10.0000 mg | ORAL_TABLET | Freq: Every day | ORAL | 3 refills | Status: DC

## 2019-12-13 NOTE — Telephone Encounter (Signed)
Pt called stating that the oxybutynin she has been taking can cause memory loss. Pt contacted insurance and states that they have an alternative that does not cause memory loss. Pt states that she would prefer to take trastiumer. Pt requested to make note that PCP recently doubled her oxybutynin. Please advise.     [facility] mail order pharmacy

## 2019-12-13 NOTE — Telephone Encounter (Signed)
Pt also states that [facility] will also be reaching out regarding her allergy medication. Pt states that she has not been taking it as she has been out of it and she has been doing well and is requesting to know if she should begin to take it again or not since she was prescribed an oral medication. Please advise.

## 2019-12-13 NOTE — Telephone Encounter (Signed)
Refill sent for singulair, does she mean trospium for the bladder medication? If so, I can send that over for her to try

## 2019-12-13 NOTE — Telephone Encounter (Signed)
Tried calling patient. No answer and no VM set up. Will try to call again later.  

## 2019-12-13 NOTE — Telephone Encounter (Signed)
Fax from pharmacy to [facility]: 12/11/20020 Next Appt: 04/02/2019  Please see previous message about Oxybutynin.

## 2019-12-14 MED ORDER — TROSPIUM CHLORIDE 20 MG PO TABS: 20.0000 mg | ORAL_TABLET | Freq: Two times a day (BID) | ORAL | 2 refills | Status: DC

## 2019-12-14 NOTE — Telephone Encounter (Signed)
Rx sent 

## 2019-12-14 NOTE — Telephone Encounter (Signed)
I didn't send it yet I was waiting for verification that it was the correct medication

## 2019-12-14 NOTE — Telephone Encounter (Signed)
Called and LVM asking for patient to please return my call.  

## 2019-12-14 NOTE — Telephone Encounter (Signed)
Patient notified and verbalized understanding. 

## 2019-12-14 NOTE — Telephone Encounter (Signed)
When I spoke to patient this morning, she stated that was the correct medication.

## 2019-12-14 NOTE — Telephone Encounter (Signed)
Patient notified. Elizabeth Malone, I do not see where the bladder medication was sent in. Can you double check please?

## 2019-12-19 ENCOUNTER — Ambulatory Visit: Payer: Medicare PPO | Admitting: Psychiatry

## 2019-12-27 ENCOUNTER — Ambulatory Visit
Admission: RE | Admit: 2019-12-27 | Discharge: 2019-12-27 | Disposition: A | Discharge status: Home / Self Care | Payer: Medicare PPO | Source: Ambulatory Visit | Attending: Family Medicine | Admitting: Family Medicine

## 2019-12-27 DIAGNOSIS — Z1231 Encounter for screening mammogram for malignant neoplasm of breast: Secondary | ICD-10-CM | POA: Diagnosis not present

## 2020-01-25 ENCOUNTER — Encounter: Payer: Self-pay | Admitting: Psychiatry

## 2020-01-25 ENCOUNTER — Telehealth (INDEPENDENT_AMBULATORY_CARE_PROVIDER_SITE_OTHER): Payer: Medicare PPO | Admitting: Psychiatry

## 2020-01-25 ENCOUNTER — Other Ambulatory Visit: Payer: Self-pay

## 2020-01-25 ENCOUNTER — Other Ambulatory Visit: Payer: Self-pay | Admitting: Family Medicine

## 2020-01-25 DIAGNOSIS — F3132 Bipolar disorder, current episode depressed, moderate: Secondary | ICD-10-CM | POA: Diagnosis not present

## 2020-01-25 DIAGNOSIS — I10 Essential (primary) hypertension: Secondary | ICD-10-CM

## 2020-01-25 DIAGNOSIS — F419 Anxiety disorder, unspecified: Secondary | ICD-10-CM | POA: Diagnosis not present

## 2020-01-25 DIAGNOSIS — F3176 Bipolar disorder, in full remission, most recent episode depressed: Secondary | ICD-10-CM

## 2020-01-25 MED ORDER — FEXOFENADINE HCL 180 MG PO TABS: 180.0000 mg | ORAL_TABLET | Freq: Every day | ORAL | 1 refills | Status: DC

## 2020-01-25 MED ORDER — BUSPIRONE HCL 15 MG PO TABS: 15.0000 mg | ORAL_TABLET | Freq: Three times a day (TID) | ORAL | 1 refills | Status: DC

## 2020-01-25 MED ORDER — GABAPENTIN 300 MG PO CAPS: 300.0000 mg | ORAL_CAPSULE | Freq: Three times a day (TID) | ORAL | 1 refills | Status: DC

## 2020-01-25 MED ORDER — LITHIUM CARBONATE 300 MG PO TABS: 600.0000 mg | ORAL_TABLET | Freq: Every day | ORAL | 1 refills | Status: DC

## 2020-01-25 MED ORDER — TRAZODONE HCL 300 MG PO TABS: 300.0000 mg | ORAL_TABLET | Freq: Every day | ORAL | 1 refills | Status: DC

## 2020-01-25 MED ORDER — METOPROLOL SUCCINATE ER 25 MG PO TB24: 25.0000 mg | ORAL_TABLET | Freq: Every day | ORAL | 0 refills | Status: DC

## 2020-01-25 MED ORDER — VENLAFAXINE HCL ER 75 MG PO CP24: ORAL_CAPSULE | ORAL | 1 refills | Status: DC

## 2020-01-25 NOTE — Telephone Encounter (Signed)
Copied from [facility] [phone]. Topic: Quick Communication - Rx Refill/Question >> Jan 25, 2020  1:41 PM Mcneil, Ja-Kwan wrote: Medication: fexofenadine (ALLEGRA ALLERGY) 180 MG tablet and metoprolol succinate (TOPROL-XL) 25 MG 24 hr tablet  Has the patient contacted their pharmacy? yes  Preferred Pharmacy (with phone number or street name): [facility] [place], [place] [address] Phone: [phone]   Fax: [phone]  Agent: Please be advised that RX refills may take up to 3 business days. We ask that you follow-up with your pharmacy.

## 2020-01-25 NOTE — Progress Notes (Signed)
[facility] MD OP Progress Note  Virtual Visit via Telephone Note  I connected with Elizabeth Malone on 01/25/20 at 11:00 AM EDT by telephone and verified that I am speaking with the correct person using two identifiers.  Location: Patient: Home Provider: Office   I discussed the limitations, risks, security and privacy concerns of performing an evaluation and management service by telephone and the availability of in person appointments. I also discussed with the patient that there may be a patient responsible charge related to this service. The patient expressed understanding and agreed to proceed.  I provided 15 minutes of non-face-to-face time during this encounter.     01/25/2020 11:36 AM Elizabeth Malone  MRN:  [id number]  Chief Complaint:  " I've been good but I'm having problems sleeping."  HPI: Patient reports that she still has difficulty with sleeping. She notes that she  took first dose of 15 mg of temazepam when she was visiting her daughter in [place]. She reports that she felt very drowsy the next day and could not keep her eyes open after taking it.  She did not take any further doses for the next few days.  She eventually lost medication bottle while visiting [place] and has not been able to to take another dose of temazepam since then.  Patient was given the option of retrying a lower dose of temazepam or retrying higher dose of trazodone.  She was agreeable to restart Trazodone 300 mg for sleep.   She reports that her mood has been stable. She notes that she is looking forward to doing the invocation and benediction at an event celebrating the [employer] War this weekend. She also reports that she is planning a trip to the beach with her children and grandchildren. No other concerns noted at this time.    Visit Diagnosis:    ICD-10-CM   1. Bipolar 1 disorder, depressed, full remission ([facility])  F31.76   2. Anxiety  F41.9     Past Psychiatric History: Bipolar disorder,  anxiety  Past Medical History:  Past Medical History:  Diagnosis Date  . Anxiety   . Bipolar disorder ([place])   . CKD (chronic kidney disease) stage 3, GFR 30-59 ml/min   . Complication of anesthesia   . Depression   . GERD (gastroesophageal reflux disease)    occasionally has to use tums  . Headache   . History of diverticulosis   . Hyperlipidemia   . Overactive bladder   . PONV (postoperative nausea and vomiting)   . Substance abuse ([facility])    alcohol    Past Surgical History:  Procedure Laterality Date  . COLONOSCOPY WITH PROPOFOL N/A 10/07/2017   Procedure: COLONOSCOPY WITH PROPOFOL;  Surgeon: [name], MD;  Location: [facility];  Service: Endoscopy;  Laterality: N/A;  . FOOT SURGERY Bilateral   . KNEE SURGERY Left   . POLYPECTOMY  10/07/2017   Procedure: POLYPECTOMY;  Surgeon: [name], MD;  Location: [facility];  Service: Endoscopy;;  . TONSILLECTOMY AND ADENOIDECTOMY      Family Psychiatric History: see below   Family History:  Family History  Problem Relation Age of Onset  . Alzheimer's disease Mother   . Bipolar disorder Mother   . Depression Mother   . Heart attack Father   . Diabetes Brother   . Obesity Brother   . Depression Maternal Grandfather   . Depression Daughter   . Stroke Maternal Grandmother   . Stroke Paternal Grandmother   .  Heart attack Paternal Grandfather   . Breast cancer Neg Hx     Social History:  Social History   Socioeconomic History  . Marital status: Married    Spouse name: Not on file  . Number of children: 2  . Years of education: Not on file  . Highest education level: Bachelor's degree (e.g., BA, AB, BS)  Occupational History  . Occupation: retired   Tobacco Use  . Smoking status: Former Smoker    Types: Cigarettes    Start date: 07/09/1967    Quit date: 07/08/1985    Years since quitting: 34.5  . Smokeless tobacco: Never Used  Substance and Sexual Activity  . Alcohol use: Yes     Alcohol/week: 3.0 standard drinks    Types: 3 Shots of liquor per week    Comment: occassional  . Drug use: No  . Sexual activity: Not Currently  Other Topics Concern  . Not on file  Social History Narrative  . Not on file   Social Determinants of Health   Financial Resource Strain:   . Difficulty of Paying Living Expenses:   Food Insecurity:   . Worried About Charity fundraiser in the Last Year:   . Arboriculturist in the Last Year:   Transportation Needs:   . Film/video editor (Medical):   Marland Kitchen Lack of Transportation (Non-Medical):   Physical Activity:   . Days of Exercise per Week:   . Minutes of Exercise per Session:   Stress:   . Feeling of Stress :   Social Connections:   . Frequency of Communication with Friends and Family:   . Frequency of Social Gatherings with Friends and Family:   . Attends Religious Services:   . Active Member of Clubs or Organizations:   . Attends Archivist Meetings:   Marland Kitchen Marital Status:     Allergies:  Allergies  Allergen Reactions  . Ace Inhibitors Other (See Comments)  . Fluoxetine Other (See Comments)  . Hydrocodone-Chlorpheniramine Other (See Comments)  . Paroxetine Hcl Other (See Comments)  . Penicillin G Benzathine Other (See Comments)  . Penicillin V Potassium Other (See Comments)  . Clindamycin Hcl Rash and Other (See Comments)    Katherina Right' syndrome  . Lamotrigine Rash    Metabolic Disorder Labs: Lab Results  Component Value Date   HGBA1C 4.9 08/17/2017   No results found for: PROLACTIN Lab Results  Component Value Date   CHOL 208 (H) 08/25/2019   TRIG 254 (H) 08/25/2019   HDL 63 08/25/2019   CHOLHDL 3.2 02/23/2019   VLDL 39 (H) 08/17/2017   LDLCALC 102 (H) 08/25/2019   LDLCALC 112 (H) 02/23/2019   Lab Results  Component Value Date   TSH 3.080 02/23/2019   TSH 0.599 02/04/2018    Therapeutic Level Labs: Lab Results  Component Value Date   LITHIUM 0.74 11/01/2018   LITHIUM 0.4 (L)  08/16/2018   No results found for: VALPROATE No components found for:  CBMZ  Current Medications: Current Outpatient Medications  Medication Sig Dispense Refill  . atorvastatin (LIPITOR) 20 MG tablet Take 1 tablet (20 mg total) by mouth daily. 90 tablet 4  . benazepril (LOTENSIN) 5 MG tablet Take 1 tablet (5 mg total) by mouth daily. 90 tablet 4  . busPIRone (BUSPAR) 15 MG tablet Take 1 tablet (15 mg total) by mouth 3 (three) times daily. 270 tablet 0  . Calcium Citrate-Vitamin D (CALCIUM + D PO) Take 1 tablet by mouth  2 (two) times daily.     . diphenoxylate-atropine (LOMOTIL) 2.5-0.025 MG tablet Take 1 tablet by mouth 4 (four) times daily as needed for diarrhea or loose stools. 360 tablet 2  . fexofenadine (ALLEGRA ALLERGY) 180 MG tablet Take 1 tablet (180 mg total) by mouth daily. 30 tablet 5  . fluticasone (FLONASE) 50 MCG/ACT nasal spray Place 2 sprays into both nostrils daily. 16 g 6  . gabapentin (NEURONTIN) 300 MG capsule Take 1 capsule (300 mg total) by mouth 3 (three) times daily. 270 capsule 0  . lithium 300 MG tablet Take 2 tablets (600 mg total) by mouth at bedtime. 180 tablet 0  . metoprolol succinate (TOPROL-XL) 25 MG 24 hr tablet Take 1 tablet (25 mg total) by mouth daily. 90 tablet 4  . montelukast (SINGULAIR) 10 MG tablet Take 1 tablet (10 mg total) by mouth at bedtime. 90 tablet 3  . Multiple Vitamin (MULTIVITAMIN) tablet Take 1 tablet by mouth daily.    . Omega-3 Fatty Acids (FISH OIL) 1000 MG CAPS Take 1,000 mg by mouth 2 (two) times daily.     Marland Kitchen oxybutynin (DITROPAN-XL) 10 MG 24 hr tablet Take 1 tablet (10 mg total) by mouth at bedtime. 90 tablet 4  . Oxymetazoline HCl (NASAL SPRAY 12 HOUR NA) Place into the nose.    . temazepam (RESTORIL) 15 MG capsule Take 1 capsule (15 mg total) by mouth at bedtime as needed for sleep. 30 capsule 1  . trospium (SANCTURA) 20 MG tablet Take 1 tablet (20 mg total) by mouth 2 (two) times daily. 60 tablet 2  . venlafaxine XR (EFFEXOR XR)  75 MG 24 hr capsule Take 3 capsules daily 270 capsule 0   No current facility-administered medications for this visit.     Musculoskeletal: Strength & Muscle Tone: unable to assess due to telemed visit Gait & Station: unable to assess due to telemed visit Patient leans: unable to assess due to telemed visit  Psychiatric Specialty Exam: Review of Systems  There were no vitals taken for this visit.There is no height or weight on file to calculate BMI.  General Appearance: Unable to assess. Phone visit.  Eye Contact:  Unable to assess. Phone visit  Speech:  Clear and Coherent and Normal Rate  Volume:  Normal  Mood: Euthymic  Affect:  Congruent  Thought Process:  Goal Directed, Linear and Descriptions of Associations: Intact  Orientation:  Full (Time, Place, and Person)  Thought Content: Logical   Suicidal Thoughts:  No  Homicidal Thoughts:  No  Memory:  Recent;   Good Remote;   Good  Judgement:  Fair  Insight:  Fair  Psychomotor Activity:  Normal  Concentration:  Concentration: Good and Attention Span: Good  Recall:  Good  Fund of Knowledge: Good  Language: Good  Akathisia:  Negative  Handed:  Right  AIMS (if indicated): not done  Assets:  Communication Skills Desire for Improvement Financial Resources/Insurance Housing Social Support  ADL's:  Intact  Cognition: WNL  Sleep:  Poor   Screenings: GAD-7     Office Visit from 02/23/2019 in [facility]  Total GAD-7 Score  14    PHQ2-9     Office Visit from 02/23/2019 in [facility] from 02/09/2019 in [facility] from 02/04/2018 in [facility] Visit from 08/17/2017 in [facility] Visit from 08/05/2016 in [facility]  PHQ-2 Total Score  2  5  0  0  0  PHQ-9 Total Score  12  12  -  -  -       Assessment and Plan: Patient reported 1 dose of temazepam caused daytime sleepiness. She would like to retry  trazodone at a higher dose. Potential side effects of medication and risks vs benefits of treatment vs non-treatment were explained and discussed. All questions were answered.  1. Bipolar 1 disorder, depressed, full remission (HCC)  - venlafaxine XR (EFFEXOR XR) 75 MG 24 hr capsule; Take 3 capsules daily  Dispense: 270 capsule; Refill: 1 - lithium 300 MG tablet; Take 2 tablets (600 mg total) by mouth at bedtime.  Dispense: 180 tablet; Refill: 1 -Restart trazodone 300 mg at bedtime  2. Anxiety - venlafaxine XR (EFFEXOR XR) 75 MG 24 hr capsule; Take 3 capsules daily  Dispense: 270 capsule; Refill: 1 - busPIRone (BUSPAR) 15 MG tablet; Take 1 tablet (15 mg total) by mouth 3 (three) times daily.  Dispense: 270 tablet; Refill: 1 - gabapentin (NEURONTIN) 300 MG capsule; Take 1 capsule (300 mg total) by mouth 3 (three) times daily.  Dispense: 270 capsule; Refill: 1   Continue individual therapy. Follow-up in 3 months.  Salley Slaughter, NP 01/25/2020, 11:36 AM   I saw and managed the patient with NP B. Adriana Mccallum, MD 01/25/2020 1:09 PM

## 2020-01-25 NOTE — Telephone Encounter (Signed)
Requested Prescriptions  Pending Prescriptions Disp Refills  . metoprolol succinate (TOPROL-XL) 25 MG 24 hr tablet 90 tablet 0    Sig: Take 1 tablet (25 mg total) by mouth daily.     Cardiovascular:  Beta Blockers Passed - 01/25/2020  1:46 PM      Passed - Last BP in normal range    BP Readings from Last 1 Encounters:  08/25/19 121/72         Passed - Last Heart Rate in normal range    Pulse Readings from Last 1 Encounters:  08/25/19 (!) 54         Passed - Valid encounter within last 6 months    Recent Outpatient Visits          2 months ago Overactive bladder   [place], [place], [place]   5 months ago Essential hypertension   [facility] Volney American, [place]   11 months ago Annual physical exam   [facility] Volney American, [place]   11 months ago [place] Jeananne Rama, Jeannette How, [place]   1 year ago Tremor   [facility] Volney American, [place]      Future Appointments            In 2 months Orene Desanctis, Lilia Argue, [place], [place]   In 2 months  [employer], [place]           . fexofenadine (ALLEGRA ALLERGY) 180 MG tablet 90 tablet 1    Sig: Take 1 tablet (180 mg total) by mouth daily.     Ear, Nose, and Throat:  Antihistamines Passed - 01/25/2020  1:46 PM      Passed - Valid encounter within last 12 months    Recent Outpatient Visits          2 months ago Overactive bladder   [facility] Volney American, [place]   5 months ago Essential hypertension   [facility] Volney American, [place]   11 months ago Annual physical exam   [facility] Volney American, [place]   11 months ago [place] Jeananne Rama, Jeannette How, [place]   1 year ago Tremor   [facility] Volney American, [place]      Future Appointments            In 2 months  Orene Desanctis, Lilia Argue, [place], [place]   In 2 months  [employer], [place]

## 2020-02-21 ENCOUNTER — Other Ambulatory Visit: Payer: Self-pay | Admitting: Family Medicine

## 2020-02-21 DIAGNOSIS — I10 Essential (primary) hypertension: Secondary | ICD-10-CM

## 2020-02-21 MED ORDER — BENAZEPRIL HCL 5 MG PO TABS: 5.0000 mg | ORAL_TABLET | Freq: Every day | ORAL | 1 refills | Status: DC

## 2020-02-21 NOTE — Telephone Encounter (Signed)
Copied from [place] [phone] [id number]. Topic: Quick Communication - Rx Refill/Question >> Feb 21, 2020  9:59 AM Mcneil, Ja-Kwan wrote: Medication: benazepril (LOTENSIN) 5 MG tablet and benazepril (LOTENSIN) 5 MG tablet    Has the patient contacted their pharmacy? yes   Preferred Pharmacy (with phone number or street name): [place], [place]  Phone: [phone]  Fax: [phone]  Agent: Please be advised that RX refills may take up to 3 business days. We ask that you follow-up with your pharmacy.

## 2020-02-23 ENCOUNTER — Other Ambulatory Visit: Payer: Self-pay | Admitting: Family Medicine

## 2020-02-23 DIAGNOSIS — E7800 Pure hypercholesterolemia, unspecified: Secondary | ICD-10-CM

## 2020-02-23 MED ORDER — ATORVASTATIN CALCIUM 20 MG PO TABS: 20.0000 mg | ORAL_TABLET | Freq: Every day | ORAL | 0 refills | Status: DC

## 2020-02-23 NOTE — Telephone Encounter (Signed)
Patient last seen 08/2019 and has appointment 04/01/20

## 2020-02-23 NOTE — Telephone Encounter (Signed)
Requested medication (s) are due for refill today: Yes  Requested medication (s) are on the active medication list: Yes  Last refill:  02/2019  Future visit scheduled: Yes  Notes to clinic: Expired Rx, unable to refill per protocol     Requested Prescriptions  Pending Prescriptions Disp Refills   atorvastatin (LIPITOR) 20 MG tablet 90 tablet 4    Sig: Take 1 tablet (20 mg total) by mouth daily.      Cardiovascular:  Antilipid - Statins Failed - 02/23/2020  3:20 PM      Failed - Total Cholesterol in normal range and within 360 days    Cholesterol, Total  Date Value Ref Range Status  08/25/2019 208 (H) 100 - 199 mg/dL Final   Cholesterol Piccolo, Waived  Date Value Ref Range Status  08/17/2017 167 <200 mg/dL Final    Comment:                            Desirable                <200                         Borderline High      200- 239                         High                     >239           Failed - LDL in normal range and within 360 days    LDL Chol Calc (NIH)  Date Value Ref Range Status  08/25/2019 102 (H) 0 - 99 mg/dL Final          Failed - Triglycerides in normal range and within 360 days    Triglycerides  Date Value Ref Range Status  08/25/2019 254 (H) 0 - 149 mg/dL Final   Triglycerides Piccolo,Waived  Date Value Ref Range Status  08/17/2017 195 (H) <150 mg/dL Final    Comment:                            Normal                   <150                         Borderline High     150 - 199                         High                200 - 499                         Very High                >499           Passed - HDL in normal range and within 360 days    HDL  Date Value Ref Range Status  08/25/2019 63 >39 mg/dL Final          Passed - Patient is not pregnant      Passed - Valid encounter within last 12  months    Recent Outpatient Visits           3 months ago Overactive bladder   [facility] Merrie Roof [place], [place]    6 months ago Essential hypertension   [facility] Volney American, [place]   1 year ago Annual physical exam   [facility] Volney American, [place]   1 year ago [place] Jeananne Rama, Jeannette How, [place]   1 year ago [place], [place], [place]       Future Appointments             In 1 month Orene Desanctis, Lilia Argue, [place], [place]   In 1 month  [employer], [place]

## 2020-02-23 NOTE — Telephone Encounter (Signed)
Medication Refill - Medication: atorvastatin   Has the patient contacted their pharmacy? Yes.   (Agent: If no, request that the patient contact the pharmacy for the refill.) (Agent: If yes, when and what did the pharmacy advise?)  Preferred Pharmacy (with phone number or street name):  [place], [place]  [place] [address]  Phone: [phone] Fax: [phone]  Hours: Not open 24 hours    Agent: Please be advised that RX refills may take up to 3 business days. We ask that you follow-up with your pharmacy.

## 2020-04-01 ENCOUNTER — Other Ambulatory Visit: Payer: Self-pay

## 2020-04-01 ENCOUNTER — Telehealth: Payer: Medicare PPO | Admitting: Psychiatry

## 2020-04-01 ENCOUNTER — Telehealth (HOSPITAL_COMMUNITY): Payer: Medicare PPO | Admitting: Psychiatry

## 2020-04-01 ENCOUNTER — Ambulatory Visit (INDEPENDENT_AMBULATORY_CARE_PROVIDER_SITE_OTHER): Payer: Medicare PPO

## 2020-04-01 ENCOUNTER — Ambulatory Visit (INDEPENDENT_AMBULATORY_CARE_PROVIDER_SITE_OTHER): Payer: Medicare PPO | Admitting: Family Medicine

## 2020-04-01 ENCOUNTER — Encounter: Payer: Self-pay | Admitting: Family Medicine

## 2020-04-01 VITALS — Ht 65.0 in | Wt 210.0 lb

## 2020-04-01 VITALS — BP 146/86 | HR 80 | Temp 98.6°F | Ht 65.0 in | Wt 210.0 lb

## 2020-04-01 DIAGNOSIS — F419 Anxiety disorder, unspecified: Secondary | ICD-10-CM | POA: Diagnosis not present

## 2020-04-01 DIAGNOSIS — K219 Gastro-esophageal reflux disease without esophagitis: Secondary | ICD-10-CM

## 2020-04-01 DIAGNOSIS — R079 Chest pain, unspecified: Secondary | ICD-10-CM

## 2020-04-01 DIAGNOSIS — M2550 Pain in unspecified joint: Secondary | ICD-10-CM

## 2020-04-01 DIAGNOSIS — E7800 Pure hypercholesterolemia, unspecified: Secondary | ICD-10-CM

## 2020-04-01 DIAGNOSIS — I10 Essential (primary) hypertension: Secondary | ICD-10-CM

## 2020-04-01 DIAGNOSIS — Z0000 Encounter for general adult medical examination without abnormal findings: Secondary | ICD-10-CM | POA: Diagnosis not present

## 2020-04-01 DIAGNOSIS — N3281 Overactive bladder: Secondary | ICD-10-CM

## 2020-04-01 DIAGNOSIS — F314 Bipolar disorder, current episode depressed, severe, without psychotic features: Secondary | ICD-10-CM | POA: Diagnosis not present

## 2020-04-01 DIAGNOSIS — L918 Other hypertrophic disorders of the skin: Secondary | ICD-10-CM

## 2020-04-01 MED ORDER — OMEPRAZOLE 40 MG PO CPDR: 40.0000 mg | DELAYED_RELEASE_CAPSULE | Freq: Every day | ORAL | 3 refills | Status: DC

## 2020-04-01 MED ORDER — ATORVASTATIN CALCIUM 20 MG PO TABS: 20.0000 mg | ORAL_TABLET | Freq: Every day | ORAL | 1 refills | Status: DC

## 2020-04-01 MED ORDER — TROSPIUM CHLORIDE 20 MG PO TABS: 20.0000 mg | ORAL_TABLET | Freq: Two times a day (BID) | ORAL | 2 refills | Status: DC

## 2020-04-01 MED ORDER — METOPROLOL SUCCINATE ER 25 MG PO TB24: 25.0000 mg | ORAL_TABLET | Freq: Every day | ORAL | 1 refills | Status: DC

## 2020-04-01 NOTE — Progress Notes (Signed)
I connected with Elizabeth Malone today by telephone and verified that I am speaking with the correct person using two identifiers. Location patient: home Location provider: work Persons participating in the virtual visit: Syna, Gad LPN.   I discussed the limitations, risks, security and privacy concerns of performing an evaluation and management service by telephone and the availability of in person appointments. I also discussed with the patient that there may be a patient responsible charge related to this service. The patient expressed understanding and verbally consented to this telephonic visit.    Interactive audio and video telecommunications were attempted between this provider and patient, however failed, due to patient having technical difficulties OR patient did not have access to video capability.  We continued and completed visit with audio only.    Vital signs may be patient reported or missing.   Subjective:   Elizabeth Malone is a 69 y.o. female who presents for Medicare Annual (Subsequent) preventive examination.  Review of Systems     Cardiac Risk Factors include: advanced age (>48men, >44 women);hypertension;obesity (BMI >30kg/m2);sedentary lifestyle     Objective:    Today's Vitals   04/01/20 1256  Weight: 210 lb (95.3 kg)  Height: 5\' 5"  (1.651 m)   Body mass index is 34.95 kg/m.  Advanced Directives 04/01/2020 02/09/2019 11/01/2018 02/04/2018 10/07/2017 02/23/2016 02/23/2016  Does Patient Have a Medical Advance Directive? No No No No No Yes No  Type of Advance Directive - - - - - Living will -  Does patient want to make changes to medical advance directive? - - - - - Yes - Spiritual care consult ordered -  Would patient like information on creating a medical advance directive? - - No - Patient declined Yes (MAU/Ambulatory/Procedural Areas - Information given) Yes (MAU/Ambulatory/Procedural Areas - Information given) Yes - Educational materials given;Yes -  Spiritual care consult ordered No - patient declined information  Some encounter information is confidential and restricted. Go to Review Flowsheets activity to see all data.    Current Medications (verified) Outpatient Encounter Medications as of 04/01/2020  Medication Sig  . atorvastatin (LIPITOR) 20 MG tablet Take 1 tablet (20 mg total) by mouth daily.  . benazepril (LOTENSIN) 5 MG tablet Take 1 tablet (5 mg total) by mouth daily.  . busPIRone (BUSPAR) 15 MG tablet Take 1 tablet (15 mg total) by mouth 3 (three) times daily.  . Calcium Citrate-Vitamin D (CALCIUM + D PO) Take 1 tablet by mouth 2 (two) times daily.   . fexofenadine (ALLEGRA ALLERGY) 180 MG tablet Take 1 tablet (180 mg total) by mouth daily.  . fluticasone (FLONASE) 50 MCG/ACT nasal spray Place 2 sprays into both nostrils daily.  Marland Kitchen gabapentin (NEURONTIN) 300 MG capsule Take 1 capsule (300 mg total) by mouth 3 (three) times daily.  Marland Kitchen lithium 300 MG tablet Take 2 tablets (600 mg total) by mouth at bedtime.  . metoprolol succinate (TOPROL-XL) 25 MG 24 hr tablet Take 1 tablet (25 mg total) by mouth daily.  . montelukast (SINGULAIR) 10 MG tablet Take 1 tablet (10 mg total) by mouth at bedtime.  . Multiple Vitamin (MULTIVITAMIN) tablet Take 1 tablet by mouth daily.  . Omega-3 Fatty Acids (FISH OIL) 1000 MG CAPS Take 1,000 mg by mouth 2 (two) times daily.   Marland Kitchen omeprazole (PRILOSEC) 40 MG capsule Take 1 capsule (40 mg total) by mouth daily.  . trazodone (DESYREL) 300 MG tablet Take 1 tablet (300 mg total) by mouth at bedtime.  . trospium (  SANCTURA) 20 MG tablet Take 1 tablet (20 mg total) by mouth 2 (two) times daily.  Marland Kitchen venlafaxine XR (EFFEXOR XR) 75 MG 24 hr capsule Take 3 capsules daily   No facility-administered encounter medications on file as of 04/01/2020.    Allergies (verified) Ace inhibitors, Fluoxetine, Hydrocodone-chlorpheniramine, Paroxetine hcl, Penicillin g benzathine, Penicillin v potassium, Clindamycin hcl, and  Lamotrigine   History: Past Medical History:  Diagnosis Date  . Anxiety   . Bipolar disorder ([place])   . CKD (chronic kidney disease) stage 3, GFR 30-59 ml/min   . Complication of anesthesia   . Depression   . GERD (gastroesophageal reflux disease)    occasionally has to use tums  . Headache   . History of diverticulosis   . Hyperlipidemia   . Overactive bladder   . PONV (postoperative nausea and vomiting)   . Substance abuse ([place])    alcohol   Past Surgical History:  Procedure Laterality Date  . COLONOSCOPY WITH PROPOFOL N/A 10/07/2017   Procedure: COLONOSCOPY WITH PROPOFOL;  Surgeon: [name], MD;  Location: [facility];  Service: Endoscopy;  Laterality: N/A;  . FOOT SURGERY Bilateral   . KNEE SURGERY Left   . POLYPECTOMY  10/07/2017   Procedure: POLYPECTOMY;  Surgeon: [name], MD;  Location: [facility];  Service: Endoscopy;;  . TONSILLECTOMY AND ADENOIDECTOMY     Family History  Problem Relation Age of Onset  . Alzheimer's disease Mother   . Bipolar disorder Mother   . Depression Mother   . Heart attack Father   . Diabetes Brother   . Obesity Brother   . Depression Maternal Grandfather   . Depression Daughter   . Stroke Maternal Grandmother   . Stroke Paternal Grandmother   . Heart attack Paternal Grandfather   . Breast cancer Neg Hx    Social History   Socioeconomic History  . Marital status: Married    Spouse name: Not on file  . Number of children: 2  . Years of education: Not on file  . Highest education level: Bachelor's degree (e.g., BA, AB, BS)  Occupational History  . Occupation: retired   Tobacco Use  . Smoking status: Former Smoker    Types: Cigarettes    Start date: 07/09/1967    Quit date: 07/08/1985    Years since quitting: 34.7  . Smokeless tobacco: Never Used  Vaping Use  . Vaping Use: Never used  Substance and Sexual Activity  . Alcohol use: Yes    Alcohol/week: 3.0 standard drinks    Types: 3 Shots of liquor  per week    Comment: occassional  . Drug use: No  . Sexual activity: Not Currently  Other Topics Concern  . Not on file  Social History Narrative  . Not on file   Social Determinants of Health   Financial Resource Strain: Low Risk   . Difficulty of Paying Living Expenses: Not hard at all  Food Insecurity: No Food Insecurity  . Worried About Charity fundraiser in the Last Year: Never true  . Ran Out of Food in the Last Year: Never true  Transportation Needs: No Transportation Needs  . Lack of Transportation (Medical): No  . Lack of Transportation (Non-Medical): No  Physical Activity: Inactive  . Days of Exercise per Week: 0 days  . Minutes of Exercise per Session: 0 min  Stress: Stress Concern Present  . Feeling of Stress : Very much  Social Connections:   . Frequency of Communication with  Friends and Family:   . Frequency of Social Gatherings with Friends and Family:   . Attends Religious Services:   . Active Member of Clubs or Organizations:   . Attends Archivist Meetings:   Marland Kitchen Marital Status:     Tobacco Counseling Counseling given: Not Answered   Clinical Intake:  Pre-visit preparation completed: Yes  Pain : No/denies pain     Nutritional Status: BMI > 30  Obese Nutritional Risks: Nausea/ vomitting/ diarrhea (little bit of vomiting with cough due to indigestion) Diabetes: No  How often do you need to have someone help you when you read instructions, pamphlets, or other written materials from your doctor or pharmacy?: 1 - Never What is the last grade level you completed in school?: college  Diabetic? no  Interpreter Needed?: No  Information entered by :: NAllen LPN   Activities of Daily Living In your present state of health, do you have any difficulty performing the following activities: 04/01/2020 04/01/2020  Hearing? N N  Vision? N N  Difficulty concentrating or making decisions? N Y  Walking or climbing stairs? N N  Dressing or bathing?  N N  Doing errands, shopping? N N  Preparing Food and eating ? N -  Using the Toilet? N -  In the past six months, have you accidently leaked urine? Y -  Comment wears a pad, medication not working -  Do you have problems with loss of bowel control? N -  Managing your Medications? N -  Managing your Finances? N -  Housekeeping or managing your Housekeeping? N -  Some recent data might be hidden    Patient Care Team: [facility] American, PA-C as PCP - General (Family Medicine) Guadalupe Maple, MD as PCP - Family Medicine (Family Medicine)  Indicate any recent Medical Services you may have received from other than [facility] providers in the past year (date may be approximate).     Assessment:   This is a routine wellness examination for Elizabeth Malone.  Hearing/Vision screen  Hearing Screening   125Hz  250Hz  500Hz  1000Hz  2000Hz  3000Hz  4000Hz  6000Hz  8000Hz   Right ear:           Left ear:           Vision Screening Comments: Regular eye exams, Dr. Daisy Floro  Dietary issues and exercise activities discussed: Current Exercise Habits: The patient does not participate in regular exercise at present  Goals    . Patient Stated     04/01/2020, wants to start walking on treadmill      Depression Screen PHQ 2/9 Scores 04/01/2020 02/23/2019 02/09/2019 02/09/2019 02/04/2018 08/17/2017 08/05/2016  PHQ - 2 Score 6 2 5  0 0 0 0  PHQ- 9 Score 26 12 12  - - - -  Exception Documentation Other- indicate reason in comment box - - - - - -  Not completed completed this morning at physical - - - - - -    Fall Risk Fall Risk  04/01/2020 04/01/2020 02/23/2019 02/23/2019 02/09/2019  Falls in the past year? 0 0 1 1 0  Number falls in past yr: - 0 0 0 -  Injury with Fall? - 1 0 1 -  Risk Factor Category  - - - - -  Risk for fall due to : Medication side effect - - History of fall(s) -  Follow up Falls evaluation completed;Education provided;Falls prevention discussed - Falls evaluation completed Falls evaluation  completed -    Any stairs in or around  the home? Yes  If so, are there any without handrails? Yes  Home free of loose throw rugs in walkways, pet beds, electrical cords, etc? Yes  Adequate lighting in your home to reduce risk of falls? Yes   ASSISTIVE DEVICES UTILIZED TO PREVENT FALLS:  Life alert? No  Use of a cane, walker or w/c? No  Grab bars in the bathroom? Yes  Shower chair or bench in shower? Yes  Elevated toilet seat or a handicapped toilet? No   TIMED UP AND GO:  Was the test performed? No . .   Cognitive Function:     6CIT Screen 04/01/2020 02/04/2018  What Year? 0 points 0 points  What month? 0 points 0 points  What time? 0 points 0 points  Count back from 20 0 points 0 points  Months in reverse 0 points 0 points  Repeat phrase 2 points 0 points  Total Score 2 0    Immunizations Immunization History  Administered Date(s) Administered  . Fluad Quad(high Dose 65+) 06/15/2019  . Influenza, High Dose Seasonal PF 06/06/2018  . Influenza-Unspecified 06/05/2015, 06/04/2016, 06/28/2017  . PFIZER SARS-COV-2 Vaccination 10/24/2019, 11/14/2019  . Pneumococcal Conjugate-13 10/20/2016  . Pneumococcal Polysaccharide-23 03/28/2018  . Tdap 03/23/2013  . Zoster 01/13/2011  . Zoster Recombinat (Shingrix) 02/19/2017, 05/10/2017    TDAP status: Up to date Flu Vaccine status: Up to date Pneumococcal vaccine status: Up to date Covid-19 vaccine status: Completed vaccines  Qualifies for Shingles Vaccine? Yes   Zostavax completed Yes   Shingrix Completed?: Yes  Screening Tests Health Maintenance  Topic Date Due  . INFLUENZA VACCINE  04/14/2020  . MAMMOGRAM  12/26/2021  . COLONOSCOPY  10/07/2022  . TETANUS/TDAP  03/24/2023  . DEXA SCAN  Completed  . COVID-19 Vaccine  Completed  . Hepatitis C Screening  Completed  . PNA vac Low Risk Adult  Completed    Health Maintenance  There are no preventive care reminders to display for this patient.  Colorectal cancer  screening: Completed 10/07/2017. Repeat every 5 years Mammogram status: Completed 12/27/2019. Repeat every year Bone Density status: Completed 10/27/2018.   Lung Cancer Screening: (Low Dose CT Chest recommended if Age 51-80 years, 30 pack-year currently smoking OR have quit w/in 15years.) does not qualify.   Lung Cancer Screening Referral: no  Additional Screening:  Hepatitis C Screening: does qualify; Completed 02/04/2016  Vision Screening: Recommended annual ophthalmology exams for early detection of glaucoma and other disorders of the eye. Is the patient up to date with their annual eye exam?  Yes  Who is the provider or what is the name of the office in which the patient attends annual eye exams? Dr. Daisy Floro If pt is not established with a provider, would they like to be referred to a provider to establish care? No .   Dental Screening: Recommended annual dental exams for proper oral hygiene  Community Resource Referral / Chronic Care Management: CRR required this visit?  No   CCM required this visit?  No      Plan:     I have personally reviewed and noted the following in the patient's chart:   . Medical and social history . Use of alcohol, tobacco or illicit drugs  . Current medications and supplements . Functional ability and status . Nutritional status . Physical activity . Advanced directives . List of other physicians . Hospitalizations, surgeries, and ER visits in previous 12 months . Vitals . Screenings to include cognitive, depression, and falls .  Referrals and appointments  In addition, I have reviewed and discussed with patient certain preventive protocols, quality metrics, and best practice recommendations. A written personalized care plan for preventive services as well as general preventive health recommendations were provided to patient.     Kellie Simmering, LPN   [id number]   Nurse Notes: Leaking more urine than usual. Doesn't think medication  is working as well any more.

## 2020-04-01 NOTE — Assessment & Plan Note (Signed)
Mildly elevated today, but feeling very anxious. Will continue to monitor home readings and work on stress control, diet and exercise

## 2020-04-01 NOTE — Assessment & Plan Note (Signed)
Urgent referral placed to new Psychiatrist per her request. Walk in clinic information and crisis center/ED reviewed if needed in interim. Continue present medications per Psychiatry recommendations

## 2020-04-01 NOTE — Patient Instructions (Signed)
Elizabeth Malone , Thank you for taking time to come for your Medicare Wellness Visit. I appreciate your ongoing commitment to your health goals. Please review the following plan we discussed and let me know if I can assist you in the future.   Screening recommendations/referrals: Colonoscopy: completed 10/07/2017, due 10/07/2022 Mammogram: completed 12/27/2019, due 12/26/2020 Bone Density: completed 10/27/2018 Recommended yearly ophthalmology/optometry visit for glaucoma screening and checkup Recommended yearly dental visit for hygiene and checkup  Vaccinations: Influenza vaccine: completed 06/15/2019, due 04/14/2020 Pneumococcal vaccine: completed 03/28/2018 Tdap vaccine: completed 03/23/2013, due 03/24/2023 Shingles vaccine: completed   Covid-19:11/14/2019, 10/24/2019  Advanced directives: Advance directive discussed with you today.    Conditions/risks identified: none  Next appointment: Follow up in one year for your annual wellness visit    Preventive Care 65 Years and Older, Female Preventive care refers to lifestyle choices and visits with your health care provider that can promote health and wellness. What does preventive care include?  A yearly physical exam. This is also called an annual well check.  Dental exams once or twice a year.  Routine eye exams. Ask your health care provider how often you should have your eyes checked.  Personal lifestyle choices, including:  Daily care of your teeth and gums.  Regular physical activity.  Eating a healthy diet.  Avoiding tobacco and drug use.  Limiting alcohol use.  Practicing safe sex.  Taking low-dose aspirin every day.  Taking vitamin and mineral supplements as recommended by your health care provider. What happens during an annual well check? The services and screenings done by your health care provider during your annual well check will depend on your age, overall health, lifestyle risk factors, and family history of  disease. Counseling  Your health care provider may ask you questions about your:  Alcohol use.  Tobacco use.  Drug use.  Emotional well-being.  Home and relationship well-being.  Sexual activity.  Eating habits.  History of falls.  Memory and ability to understand (cognition).  Work and work Statistician.  Reproductive health. Screening  You may have the following tests or measurements:  Height, weight, and BMI.  Blood pressure.  Lipid and cholesterol levels. These may be checked every 5 years, or more frequently if you are over 38 years old.  Skin check.  Lung cancer screening. You may have this screening every year starting at age 40 if you have a 30-pack-year history of smoking and currently smoke or have quit within the past 15 years.  Fecal occult blood test (FOBT) of the stool. You may have this test every year starting at age 93.  Flexible sigmoidoscopy or colonoscopy. You may have a sigmoidoscopy every 5 years or a colonoscopy every 10 years starting at age 28.  Hepatitis C blood test.  Hepatitis B blood test.  Sexually transmitted disease (STD) testing.  Diabetes screening. This is done by checking your blood sugar (glucose) after you have not eaten for a while (fasting). You may have this done every 1-3 years.  Bone density scan. This is done to screen for osteoporosis. You may have this done starting at age 103.  Mammogram. This may be done every 1-2 years. Talk to your health care provider about how often you should have regular mammograms. Talk with your health care provider about your test results, treatment options, and if necessary, the need for more tests. Vaccines  Your health care provider may recommend certain vaccines, such as:  Influenza vaccine. This is recommended every year.  Tetanus,  diphtheria, and acellular pertussis (Tdap, Td) vaccine. You may need a Td booster every 10 years.  Zoster vaccine. You may need this after age  79.  Pneumococcal 13-valent conjugate (PCV13) vaccine. One dose is recommended after age 73.  Pneumococcal polysaccharide (PPSV23) vaccine. One dose is recommended after age 10. Talk to your health care provider about which screenings and vaccines you need and how often you need them. This information is not intended to replace advice given to you by your health care provider. Make sure you discuss any questions you have with your health care provider. Document Released: 09/27/2015 Document Revised: 05/20/2016 Document Reviewed: 07/02/2015 Elsevier Interactive Patient Education  2017 [place] Prevention in the Home Falls can cause injuries. They can happen to people of all ages. There are many things you can do to make your home safe and to help prevent falls. What can I do on the outside of my home?  Regularly fix the edges of walkways and driveways and fix any cracks.  Remove anything that might make you trip as you walk through a door, such as a raised step or threshold.  Trim any bushes or trees on the path to your home.  Use bright outdoor lighting.  Clear any walking paths of anything that might make someone trip, such as rocks or tools.  Regularly check to see if handrails are loose or broken. Make sure that both sides of any steps have handrails.  Any raised decks and porches should have guardrails on the edges.  Have any leaves, snow, or ice cleared regularly.  Use sand or salt on walking paths during winter.  Clean up any spills in your garage right away. This includes oil or grease spills. What can I do in the bathroom?  Use night lights.  Install grab bars by the toilet and in the tub and shower. Do not use towel bars as grab bars.  Use non-skid mats or decals in the tub or shower.  If you need to sit down in the shower, use a plastic, non-slip stool.  Keep the floor dry. Clean up any water that spills on the floor as soon as it happens.  Remove  soap buildup in the tub or shower regularly.  Attach bath mats securely with double-sided non-slip rug tape.  Do not have throw rugs and other things on the floor that can make you trip. What can I do in the bedroom?  Use night lights.  Make sure that you have a light by your bed that is easy to reach.  Do not use any sheets or blankets that are too big for your bed. They should not hang down onto the floor.  Have a firm chair that has side arms. You can use this for support while you get dressed.  Do not have throw rugs and other things on the floor that can make you trip. What can I do in the kitchen?  Clean up any spills right away.  Avoid walking on wet floors.  Keep items that you use a lot in easy-to-reach places.  If you need to reach something above you, use a strong step stool that has a grab bar.  Keep electrical cords out of the way.  Do not use floor polish or wax that makes floors slippery. If you must use wax, use non-skid floor wax.  Do not have throw rugs and other things on the floor that can make you trip. What can I do with  my stairs?  Do not leave any items on the stairs.  Make sure that there are handrails on both sides of the stairs and use them. Fix handrails that are broken or loose. Make sure that handrails are as long as the stairways.  Check any carpeting to make sure that it is firmly attached to the stairs. Fix any carpet that is loose or worn.  Avoid having throw rugs at the top or bottom of the stairs. If you do have throw rugs, attach them to the floor with carpet tape.  Make sure that you have a light switch at the top of the stairs and the bottom of the stairs. If you do not have them, ask someone to add them for you. What else can I do to help prevent falls?  Wear shoes that:  Do not have high heels.  Have rubber bottoms.  Are comfortable and fit you well.  Are closed at the toe. Do not wear sandals.  If you use a  stepladder:  Make sure that it is fully opened. Do not climb a closed stepladder.  Make sure that both sides of the stepladder are locked into place.  Ask someone to hold it for you, if possible.  Clearly mark and make sure that you can see:  Any grab bars or handrails.  First and last steps.  Where the edge of each step is.  Use tools that help you move around (mobility aids) if they are needed. These include:  Canes.  Walkers.  Scooters.  Crutches.  Turn on the lights when you go into a dark area. Replace any light bulbs as soon as they burn out.  Set up your furniture so you have a clear path. Avoid moving your furniture around.  If any of your floors are uneven, fix them.  If there are any pets around you, be aware of where they are.  Review your medicines with your doctor. Some medicines can make you feel dizzy. This can increase your chance of falling. Ask your doctor what other things that you can do to help prevent falls. This information is not intended to replace advice given to you by your health care provider. Make sure you discuss any questions you have with your health care provider. Document Released: 06/27/2009 Document Revised: 02/06/2016 Document Reviewed: 10/05/2014 Elsevier Interactive Patient Education  2017 [employer].

## 2020-04-01 NOTE — Patient Instructions (Signed)
Both of these clinics take walk in patients. Go to the ER/call 911 if you are worried about hurting yourself or someone else  [facility], [place], [place] [address] Hours:  [facility] [facility] ? 9AM Phone: [phone]  [facility] - [address], [place], [place] [address] Hours:  Open ? Closes 5PM Phone: [phone]

## 2020-04-01 NOTE — Assessment & Plan Note (Signed)
Recheck lipids, adjust as needed. Continue lipitor and lifestyle modifications. 

## 2020-04-01 NOTE — Assessment & Plan Note (Signed)
Urgent Psychiatry referral placed, resources given if needed in meantime. Continue present medications

## 2020-04-01 NOTE — Progress Notes (Signed)
BP (!) 146/86   Pulse 80   Temp 98.6 F (37 C) (Oral)   Ht _0  (1.651 m)   Wt 210 lb (95.3 kg)   SpO2 98%   BMI 34.95 kg/m    Subjective:    Patient ID: Elizabeth Malone, female    DOB: [birth year], 69 y.o.   MRN: [id number]  HPI: Elizabeth Malone is a 69 y.o. female presenting on 04/01/2020 for comprehensive medical examination. Current medical complaints include:see below  Worsening heartburn sxs lately, taking pepcid and TUMs with no relief. Denies melena, N/V, abdominal pain. Seems to get worse depending on what she eats but now is constant even if she doesn't eat. No PPI therapy tried.   Substernal chest pains that feel like they radiate down into nipple. This has been intermittent the past few months. Denies associated SOB, diaphoresis, syncope, dizziness, hx of MI.   Has been followed by Psychiatry for quite some time for severe bipolar disorder and anxiety. Previous provider quit and she is requesting referral somewhere else. Has had passive SI and lots of panic episodes lately, under a lot of stress. No intent to harm self or plan to do so at this time. Taking her medications faithfully per previous Psychiatrist.   She currently lives with: Menopausal Symptoms: no  Depression Screen done today and results listed below:  Depression screen [facility] 2/9 04/01/2020 02/23/2019 02/09/2019 02/09/2019 02/04/2018  Decreased Interest _1 0 0  Down, Depressed, Hopeless _2 0 0  PHQ - 2 Score _3 0 0  Altered sleeping 3 0 3 - -  Tired, decreased energy _4 - -  Change in appetite 3 2 0 - -  Feeling bad or failure about yourself  3 2 0 - -  Trouble concentrating _5 - -  Moving slowly or fidgety/restless 3 2 0 - -  Suicidal thoughts 3 0 0 - -  PHQ-9 Score _6 - -  Difficult doing work/chores - Very difficult Somewhat difficult - -    The patient does not have a history of falls. I did complete a risk assessment for falls. A plan of care for falls was documented.   Past  Medical History:  Past Medical History:  Diagnosis Date  . Anxiety   . Bipolar disorder ([place])   . CKD (chronic kidney disease) stage 3, GFR 30-59 ml/min   . Complication of anesthesia   . Depression   . GERD (gastroesophageal reflux disease)    occasionally has to use tums  . Headache   . History of diverticulosis   . Hyperlipidemia   . Overactive bladder   . PONV (postoperative nausea and vomiting)   . Substance abuse ([place])    alcohol    Surgical History:  Past Surgical History:  Procedure Laterality Date  . COLONOSCOPY WITH PROPOFOL N/A 10/07/2017   Procedure: COLONOSCOPY WITH PROPOFOL;  Surgeon: [name], MD;  Location: [place];  Service: Endoscopy;  Laterality: N/A;  . FOOT SURGERY Bilateral   . KNEE SURGERY Left   . POLYPECTOMY  10/07/2017   Procedure: POLYPECTOMY;  Surgeon: [name], MD;  Location: [place];  Service: Endoscopy;;  . TONSILLECTOMY AND ADENOIDECTOMY      Medications:  Current Outpatient Medications on File Prior to Visit  Medication Sig  . benazepril (LOTENSIN) 5 MG tablet Take 1 tablet (5 mg total) by mouth daily.  . busPIRone (BUSPAR) 15 MG tablet  Take 1 tablet (15 mg total) by mouth 3 (three) times daily.  . Calcium Citrate-Vitamin D (CALCIUM + D PO) Take 1 tablet by mouth 2 (two) times daily.   . fexofenadine (ALLEGRA ALLERGY) 180 MG tablet Take 1 tablet (180 mg total) by mouth daily.  . fluticasone (FLONASE) 50 MCG/ACT nasal spray Place 2 sprays into both nostrils daily.  Marland Kitchen gabapentin (NEURONTIN) 300 MG capsule Take 1 capsule (300 mg total) by mouth 3 (three) times daily.  Marland Kitchen lithium 300 MG tablet Take 2 tablets (600 mg total) by mouth at bedtime.  . montelukast (SINGULAIR) 10 MG tablet Take 1 tablet (10 mg total) by mouth at bedtime.  . Multiple Vitamin (MULTIVITAMIN) tablet Take 1 tablet by mouth daily.  . Omega-3 Fatty Acids (FISH OIL) 1000 MG CAPS Take 1,000 mg by mouth 2 (two) times daily.   . trazodone (DESYREL)  300 MG tablet Take 1 tablet (300 mg total) by mouth at bedtime.  . trospium (SANCTURA) 20 MG tablet Take 1 tablet (20 mg total) by mouth 2 (two) times daily.  Marland Kitchen venlafaxine XR (EFFEXOR XR) 75 MG 24 hr capsule Take 3 capsules daily   No current facility-administered medications on file prior to visit.    Allergies:  Allergies  Allergen Reactions  . Ace Inhibitors Other (See Comments)  . Fluoxetine Other (See Comments)  . Hydrocodone-Chlorpheniramine Other (See Comments)  . Paroxetine Hcl Other (See Comments)  . Penicillin G Benzathine Other (See Comments)  . Penicillin V Potassium Other (See Comments)  . Clindamycin Hcl Rash and Other (See Comments)    Katherina Right' syndrome  . Lamotrigine Rash    Social History:  Social History   Socioeconomic History  . Marital status: Married    Spouse name: Not on file  . Number of children: 2  . Years of education: Not on file  . Highest education level: Bachelor's degree (e.g., BA, AB, BS)  Occupational History  . Occupation: retired   Tobacco Use  . Smoking status: Former Smoker    Types: Cigarettes    Start date: 07/09/1967    Quit date: 07/08/1985    Years since quitting: 34.7  . Smokeless tobacco: Never Used  Vaping Use  . Vaping Use: Never used  Substance and Sexual Activity  . Alcohol use: Yes    Alcohol/week: 3.0 standard drinks    Types: 3 Shots of liquor per week    Comment: occassional  . Drug use: No  . Sexual activity: Not Currently  Other Topics Concern  . Not on file  Social History Narrative  . Not on file   Social Determinants of Health   Financial Resource Strain: Low Risk   . Difficulty of Paying Living Expenses: Not hard at all  Food Insecurity: No Food Insecurity  . Worried About Charity fundraiser in the Last Year: Never true  . Ran Out of Food in the Last Year: Never true  Transportation Needs: No Transportation Needs  . Lack of Transportation (Medical): No  . Lack of Transportation  (Non-Medical): No  Physical Activity: Inactive  . Days of Exercise per Week: 0 days  . Minutes of Exercise per Session: 0 min  Stress: Stress Concern Present  . Feeling of Stress : Very much  Social Connections:   . Frequency of Communication with Friends and Family:   . Frequency of Social Gatherings with Friends and Family:   . Attends Religious Services:   . Active Member of Clubs or Organizations:   .  Attends Archivist Meetings:   Marland Kitchen Marital Status:   Intimate Partner Violence:   . Fear of Current or Ex-Partner:   . Emotionally Abused:   Marland Kitchen Physically Abused:   . Sexually Abused:    Social History   Tobacco Use  Smoking Status Former Smoker  . Types: Cigarettes  . Start date: 07/09/1967  . Quit date: 07/08/1985  . Years since quitting: 34.7  Smokeless Tobacco Never Used   Social History   Substance and Sexual Activity  Alcohol Use Yes  . Alcohol/week: 3.0 standard drinks  . Types: 3 Shots of liquor per week   Comment: occassional    Family History:  Family History  Problem Relation Age of Onset  . Alzheimer's disease Mother   . Bipolar disorder Mother   . Depression Mother   . Heart attack Father   . Diabetes Brother   . Obesity Brother   . Depression Maternal Grandfather   . Depression Daughter   . Stroke Maternal Grandmother   . Stroke Paternal Grandmother   . Heart attack Paternal Grandfather   . Breast cancer Neg Hx     Past medical history, surgical history, medications, allergies, family history and social history reviewed with patient today and changes made to appropriate areas of the chart.   Review of Systems - General ROS: negative Psychological ROS: positive for - anxiety and depression Ophthalmic ROS: negative ENT ROS: negative Allergy and Immunology ROS: negative Hematological and Lymphatic ROS: negative Endocrine ROS: negative Breast ROS: negative for breast lumps Respiratory ROS: no cough, shortness of breath, or  wheezing Cardiovascular ROS: positive for - chest pain Gastrointestinal ROS: no abdominal pain, change in bowel habits, or black or bloody stools Genito-Urinary ROS: no dysuria, trouble voiding, or hematuria Musculoskeletal ROS: positive for - joint pain Neurological ROS: no TIA or stroke symptoms Dermatological ROS: positive for skin lesion changes All other ROS negative except what is listed above and in the HPI.      Objective:    BP (!) 146/86   Pulse 80   Temp 98.6 F (37 C) (Oral)   Ht _0  (1.651 m)   Wt 210 lb (95.3 kg)   SpO2 98%   BMI 34.95 kg/m   Wt Readings from Last 3 Encounters:  04/01/20 210 lb (95.3 kg)  04/01/20 210 lb (95.3 kg)  11/02/19 202 lb (91.6 kg)    Physical Exam Vitals and nursing note reviewed.  Constitutional:      General: She is not in acute distress.    Appearance: She is well-developed.  HENT:     Head: Atraumatic.     Right Ear: External ear normal.     Left Ear: External ear normal.     Nose: Nose normal.     Mouth/Throat:     Pharynx: No oropharyngeal exudate.  Eyes:     General: No scleral icterus.    Conjunctiva/sclera: Conjunctivae normal.     Pupils: Pupils are equal, round, and reactive to light.  Neck:     Thyroid: No thyromegaly.  Cardiovascular:     Rate and Rhythm: Normal rate and regular rhythm.     Heart sounds: Normal heart sounds.  Pulmonary:     Effort: Pulmonary effort is normal. No respiratory distress.     Breath sounds: Normal breath sounds.  Chest:     Breasts:        Right: No mass, skin change or tenderness.        Left:  No mass, skin change or tenderness.  Abdominal:     General: Bowel sounds are normal.     Palpations: Abdomen is soft. There is no mass.     Tenderness: There is no abdominal tenderness.  Genitourinary:    Comments: GU exam declined with shared decision making Musculoskeletal:        General: No tenderness. Normal range of motion.     Cervical back: Normal range of motion and neck  supple.  Lymphadenopathy:     Cervical: No cervical adenopathy.     Upper Body:     Right upper body: No axillary adenopathy.     Left upper body: No axillary adenopathy.  Skin:    General: Skin is warm and dry.     Findings: No rash.  Neurological:     Mental Status: She is alert and oriented to person, place, and time.     Cranial Nerves: No cranial nerve deficit.  Psychiatric:        Behavior: Behavior normal.     Results for orders placed or performed in visit on 04/01/20  Microscopic Examination   Urine  Result Value Ref Range   WBC, UA 6-10 (A) 0 - 5 /hpf   RBC 3-10 (A) 0 - 2 /hpf   Epithelial Cells (non renal) 0-10 0 - 10 /hpf   Renal Epithel, UA 0-10 None seen /hpf   Bacteria, UA Few (A) None seen/Few  Urine Culture, Reflex   Urine  Result Value Ref Range   Urine Culture, Routine WILL FOLLOW   UA/M w/rflx Culture, Routine   Specimen: Urine, Clean Catch   Urine  Result Value Ref Range   Specific Gravity, UA 1.020 1.005 - 1.030   pH, UA 7.5 5.0 - 7.5   Color, UA Yellow Yellow   Appearance Ur Clear Clear   Leukocytes,UA Trace (A) Negative   Protein,UA Negative Negative/Trace   Glucose, UA Negative Negative   Ketones, UA Negative Negative   RBC, UA Trace (A) Negative   Bilirubin, UA Negative Negative   Urobilinogen, Ur 0.2 0.2 - 1.0 mg/dL   Nitrite, UA Negative Negative   Microscopic Examination See below:    Urinalysis Reflex Comment       Assessment & Plan:   Problem List Items Addressed This Visit      Cardiovascular and Mediastinum   Essential hypertension - Primary    Mildly elevated today, but feeling very anxious. Will continue to monitor home readings and work on stress control, diet and exercise       Relevant Medications   atorvastatin (LIPITOR) 20 MG tablet   metoprolol succinate (TOPROL-XL) 25 MG 24 hr tablet   Other Relevant Orders   CBC with Differential/Platelet   Comprehensive metabolic panel   UA/M w/rflx Culture, Routine  (Completed)     Genitourinary   Overactive bladder    Stable, continue trospium        Other   Bipolar disorder [facility])    Urgent referral placed to new Psychiatrist per her request. Walk in clinic information and crisis center/ED reviewed if needed in interim. Continue present medications per Psychiatry recommendations      Relevant Orders   Ambulatory referral to Psychiatry   Hypercholesteremia    Recheck lipids, adjust as needed. Continue lipitor and lifestyle modifications      Relevant Medications   atorvastatin (LIPITOR) 20 MG tablet   metoprolol succinate (TOPROL-XL) 25 MG 24 hr tablet   Other Relevant Orders   Lipid  Panel w/o Chol/HDL Ratio   Anxiety    Urgent Psychiatry referral placed, resources given if needed in meantime. Continue present medications       Relevant Orders   Ambulatory referral to Psychiatry    Other Visit Diagnoses    Annual physical exam       Chest pain, unspecified type       EKG NSR without acute abnormalities, suspect GERD vs anxiety. PPI started, monitor closely for benefit and work on stress control   Relevant Orders   EKG 12-Lead (Completed)   Arthralgia, unspecified joint       mentioned during exam, requesting autoimmune labs. await results   Relevant Orders   Sed Rate (ESR)   Rheumatoid Factor   ANA w/Reflex   Gastroesophageal reflux disease without esophagitis       Start prilosec daily, pepcid and TUMs prn. Diet modifications reviewed for increased control   Relevant Medications   omeprazole (PRILOSEC) 40 MG capsule   Skin tag       mentioned during exam, requesting removal. will perform if desired at [facility]cm[facility]      60 minutes spent today in direct patient care, counseling, and coordination of care.   Follow up plan: Return for skin tag removal, f/u other concerns.   LABORATORY TESTING:  - Pap smear: not applicable  IMMUNIZATIONS:   - Tdap: Tetanus vaccination status reviewed: last tetanus booster within 10  years. - Influenza: Up to date - Pneumovax: Up to date - Prevnar: Up to date - HPV: Not applicable - Zostavax vaccine: Up to date  SCREENING: -Mammogram: Up to date  - Colonoscopy: Up to date  - Bone Density: Up to date   PATIENT COUNSELING:   Advised to take 1 mg of folate supplement per day if capable of pregnancy.   Sexuality: Discussed sexually transmitted diseases, partner selection, use of condoms, avoidance of unintended pregnancy  and contraceptive alternatives.   Advised to avoid cigarette smoking.  I discussed with the patient that most people either abstain from alcohol or drink within safe limits (<=14/week and <=4 drinks/occasion for males, <=7/weeks and <= 3 drinks/occasion for females) and that the risk for alcohol disorders and other health effects rises proportionally with the number of drinks per week and how often a drinker exceeds daily limits.  Discussed cessation/primary prevention of drug use and availability of treatment for abuse.   Diet: Encouraged to adjust caloric intake to maintain  or achieve ideal body weight, to reduce intake of dietary saturated fat and total fat, to limit sodium intake by avoiding high sodium foods and not adding table salt, and to maintain adequate dietary potassium and calcium preferably from fresh fruits, vegetables, and low-fat dairy products.    stressed the importance of regular exercise  Injury prevention: Discussed safety belts, safety helmets, smoke detector, smoking near bedding or upholstery.   Dental health: Discussed importance of regular tooth brushing, flossing, and dental visits.    NEXT PREVENTATIVE PHYSICAL DUE IN 1 YEAR. Return for skin tag removal, f/u other concerns.

## 2020-04-01 NOTE — Assessment & Plan Note (Signed)
Stable, continue trospium

## 2020-04-02 ENCOUNTER — Other Ambulatory Visit: Payer: Self-pay | Admitting: Family Medicine

## 2020-04-02 DIAGNOSIS — E875 Hyperkalemia: Secondary | ICD-10-CM

## 2020-04-02 LAB — LIPID PANEL W/O CHOL/HDL RATIO
Cholesterol, Total: 201 mg/dL — ABNORMAL HIGH (ref 100–199)
HDL: 60 mg/dL (ref >39)
LDL Chol Calc (NIH): 94 mg/dL (ref 0–99)
Triglycerides: 283 mg/dL — ABNORMAL HIGH (ref 0–149)
VLDL Cholesterol Cal: 47 mg/dL — ABNORMAL HIGH (ref 5–40)

## 2020-04-02 LAB — COMPREHENSIVE METABOLIC PANEL
ALT: 31 IU/L (ref 0–32)
AST: 17 IU/L (ref 0–40)
Albumin/Globulin Ratio: 2 (ref 1.2–2.2)
Albumin: 4.7 g/dL (ref 3.8–4.8)
Alkaline Phosphatase: 97 IU/L (ref 48–121)
BUN/Creatinine Ratio: 13 (ref 12–28)
BUN: 12 mg/dL (ref 8–27)
Bilirubin Total: 0.3 mg/dL (ref 0.0–1.2)
CO2: 27 mmol/L (ref 20–29)
Calcium: 10.1 mg/dL (ref 8.7–10.3)
Chloride: 101 mmol/L (ref 96–106)
Creatinine, Ser: 0.94 mg/dL (ref 0.57–1.00)
GFR calc Af Amer: 72 mL/min/{1.73_m2} (ref >59)
GFR calc non Af Amer: 63 mL/min/{1.73_m2} (ref >59)
Globulin, Total: 2.4 g/dL (ref 1.5–4.5)
Glucose: 92 mg/dL (ref 65–99)
Potassium: 5.3 mmol/L — ABNORMAL HIGH (ref 3.5–5.2)
Sodium: 139 mmol/L (ref 134–144)
Total Protein: 7.1 g/dL (ref 6.0–8.5)

## 2020-04-02 LAB — CBC WITH DIFFERENTIAL/PLATELET
Basophils Absolute: 0 10*3/uL (ref 0.0–0.2)
Basos: 1 %
EOS (ABSOLUTE): 0.2 10*3/uL (ref 0.0–0.4)
Eos: 3 %
Hematocrit: 43.9 % (ref 34.0–46.6)
Hemoglobin: 14.4 g/dL (ref 11.1–15.9)
Immature Grans (Abs): 0 10*3/uL (ref 0.0–0.1)
Immature Granulocytes: 0 %
Lymphocytes Absolute: 1.3 10*3/uL (ref 0.7–3.1)
Lymphs: 19 %
MCH: 31.4 pg (ref 26.6–33.0)
MCHC: 32.8 g/dL (ref 31.5–35.7)
MCV: 96 fL (ref 79–97)
Monocytes Absolute: 0.5 10*3/uL (ref 0.1–0.9)
Monocytes: 7 %
Neutrophils Absolute: 5.1 10*3/uL (ref 1.4–7.0)
Neutrophils: 70 %
Platelets: 261 10*3/uL (ref 150–450)
RBC: 4.58 x10E6/uL (ref 3.77–5.28)
RDW: 11.7 % (ref 11.7–15.4)
WBC: 7.3 10*3/uL (ref 3.4–10.8)

## 2020-04-02 LAB — SEDIMENTATION RATE: Sed Rate: 2 mm/hr (ref 0–40)

## 2020-04-02 LAB — RHEUMATOID FACTOR: Rheumatoid fact SerPl-aCnc: <10 IU/mL (ref 0.0–13.9)

## 2020-04-02 LAB — ANA W/REFLEX: Anti Nuclear Antibody (ANA): NEGATIVE

## 2020-04-03 ENCOUNTER — Other Ambulatory Visit: Payer: Self-pay

## 2020-04-03 ENCOUNTER — Telehealth (INDEPENDENT_AMBULATORY_CARE_PROVIDER_SITE_OTHER): Payer: Medicare PPO | Admitting: Psychiatry

## 2020-04-03 ENCOUNTER — Encounter (HOSPITAL_COMMUNITY): Payer: Self-pay | Admitting: Psychiatry

## 2020-04-03 DIAGNOSIS — F3160 Bipolar disorder, current episode mixed, unspecified: Secondary | ICD-10-CM

## 2020-04-03 DIAGNOSIS — F419 Anxiety disorder, unspecified: Secondary | ICD-10-CM

## 2020-04-03 DIAGNOSIS — F3132 Bipolar disorder, current episode depressed, moderate: Secondary | ICD-10-CM

## 2020-04-03 DIAGNOSIS — F3176 Bipolar disorder, in full remission, most recent episode depressed: Secondary | ICD-10-CM

## 2020-04-03 MED ORDER — GABAPENTIN 300 MG PO CAPS: 300.0000 mg | ORAL_CAPSULE | Freq: Three times a day (TID) | ORAL | 1 refills | Status: DC

## 2020-04-03 MED ORDER — BUSPIRONE HCL 15 MG PO TABS: 15.0000 mg | ORAL_TABLET | Freq: Three times a day (TID) | ORAL | 1 refills | Status: DC

## 2020-04-03 MED ORDER — LITHIUM CARBONATE 300 MG PO TABS: 600.0000 mg | ORAL_TABLET | Freq: Every day | ORAL | 1 refills | Status: DC

## 2020-04-03 MED ORDER — TRAZODONE HCL 300 MG PO TABS: 300.0000 mg | ORAL_TABLET | Freq: Every day | ORAL | 1 refills | Status: DC

## 2020-04-03 MED ORDER — VENLAFAXINE HCL ER 75 MG PO CP24: ORAL_CAPSULE | ORAL | 1 refills | Status: DC

## 2020-04-03 MED ORDER — CARIPRAZINE HCL 1.5 MG PO CAPS: 1.5000 mg | ORAL_CAPSULE | Freq: Every day | ORAL | 1 refills | Status: DC

## 2020-04-03 NOTE — Progress Notes (Signed)
[facility] MD OP Progress Note  Virtual Visit via Video Note  I connected with Elizabeth Malone on 04/03/20 at  4:00 PM EDT by a video enabled telemedicine application and verified that I am speaking with the correct person using two identifiers.  Location: Patient: Home Provider: Clinic   I discussed the limitations of evaluation and management by telemedicine and the availability of in person appointments. The patient expressed understanding and agreed to proceed.  I provided 18 minutes of non-face-to-face time during this encounter.     04/03/2020 4:23 PM Elizabeth Malone  MRN:  [id number]  Chief Complaint:  " I was worried that I will not get any refills."  HPI: Patient stated that she was not sure if the writer will be seeing her again.  She stated that there was more confusion about the appointments.  Patient was scheduled to see the writer on July 19 however she called and stated that she wanted to see a different psychiatrist located in the [place] office.  And therefore her appointment was canceled by the front desk staff.  Today she called the front desk staff frantically stating that she needs to be seen and that she wants the writer to call her back immediately. Patient was offered same-day appointment at 4 PM today by the writer via the front of staff.  Patient was noted to be extremely irritable and labile in mood.  Patient made comments about the writer's appearance.  She made remarks about the writer taking vacation and then also made remarks about the other psychiatrist in [place] also taking vacation time.  She stated that she was told by the front desk staff from [place] office that the writer would not be seeing her again virtually.  Writer informed her that was never the case as far as we know.  She stated that her mood is all over the place and that her husband has been very upset with her.  She stated that he is easily angered by her.  She reported that she has been doing  a lot of impulsive things like spending money on things that she does not need and that is really made her husband angry with her.  She stated that she has heard about a new medication called Vraylar and asked if the writer ever heard about it.  Writer told her the FDA indications and asked her concern.  She stated that she really wants to try it as her mood is all over the place.  She asked if it could be added to her current regimen.  Writer informed her that could be done and patient was pleased to know that.  She could not decide which pharmacy the prescription is to be sent to.  She denied any suicidal or homicidal ideations.  She seemed to be quite impatient and had a hard time waiting for any response.   Visit Diagnosis:    ICD-10-CM   1. Bipolar I disorder, most recent episode mixed ([place])  F31.60     Past Psychiatric History: Bipolar disorder, anxiety  Past Medical History:  Past Medical History:  Diagnosis Date  . Anxiety   . Bipolar disorder ([place])   . CKD (chronic kidney disease) stage 3, GFR 30-59 ml/min   . Complication of anesthesia   . Depression   . GERD (gastroesophageal reflux disease)    occasionally has to use tums  . Headache   . History of diverticulosis   . Hyperlipidemia   . Overactive bladder   .  PONV (postoperative nausea and vomiting)   . Substance abuse ([place])    alcohol    Past Surgical History:  Procedure Laterality Date  . COLONOSCOPY WITH PROPOFOL N/A 10/07/2017   Procedure: COLONOSCOPY WITH PROPOFOL;  Surgeon: [name], MD;  Location: [facility];  Service: Endoscopy;  Laterality: N/A;  . FOOT SURGERY Bilateral   . KNEE SURGERY Left   . POLYPECTOMY  10/07/2017   Procedure: POLYPECTOMY;  Surgeon: [name], MD;  Location: [facility];  Service: Endoscopy;;  . TONSILLECTOMY AND ADENOIDECTOMY      Family Psychiatric History: see below   Family History:  Family History  Problem Relation Age of Onset  . Alzheimer's  disease Mother   . Bipolar disorder Mother   . Depression Mother   . Heart attack Father   . Diabetes Brother   . Obesity Brother   . Depression Maternal Grandfather   . Depression Daughter   . Stroke Maternal Grandmother   . Stroke Paternal Grandmother   . Heart attack Paternal Grandfather   . Breast cancer Neg Hx     Social History:  Social History   Socioeconomic History  . Marital status: Married    Spouse name: Not on file  . Number of children: 2  . Years of education: Not on file  . Highest education level: Bachelor's degree (e.g., BA, AB, BS)  Occupational History  . Occupation: retired   Tobacco Use  . Smoking status: Former Smoker    Types: Cigarettes    Start date: 07/09/1967    Quit date: 07/08/1985    Years since quitting: 34.7  . Smokeless tobacco: Never Used  Vaping Use  . Vaping Use: Never used  Substance and Sexual Activity  . Alcohol use: Yes    Alcohol/week: 3.0 standard drinks    Types: 3 Shots of liquor per week    Comment: occassional  . Drug use: No  . Sexual activity: Not Currently  Other Topics Concern  . Not on file  Social History Narrative  . Not on file   Social Determinants of Health   Financial Resource Strain: Low Risk   . Difficulty of Paying Living Expenses: Not hard at all  Food Insecurity: No Food Insecurity  . Worried About Charity fundraiser in the Last Year: Never true  . Ran Out of Food in the Last Year: Never true  Transportation Needs: No Transportation Needs  . Lack of Transportation (Medical): No  . Lack of Transportation (Non-Medical): No  Physical Activity: Inactive  . Days of Exercise per Week: 0 days  . Minutes of Exercise per Session: 0 min  Stress: Stress Concern Present  . Feeling of Stress : Very much  Social Connections:   . Frequency of Communication with Friends and Family:   . Frequency of Social Gatherings with Friends and Family:   . Attends Religious Services:   . Active Member of Clubs or  Organizations:   . Attends Archivist Meetings:   Marland Kitchen Marital Status:     Allergies:  Allergies  Allergen Reactions  . Ace Inhibitors Other (See Comments)  . Fluoxetine Other (See Comments)  . Hydrocodone-Chlorpheniramine Other (See Comments)  . Paroxetine Hcl Other (See Comments)  . Penicillin G Benzathine Other (See Comments)  . Penicillin V Potassium Other (See Comments)  . Clindamycin Hcl Rash and Other (See Comments)    Katherina Right' syndrome  . Lamotrigine Rash    Metabolic Disorder Labs: Lab Results  Component  Value Date   HGBA1C 4.9 08/17/2017   No results found for: PROLACTIN Lab Results  Component Value Date   CHOL 201 (H) 04/01/2020   TRIG 283 (H) 04/01/2020   HDL 60 04/01/2020   CHOLHDL 3.2 02/23/2019   VLDL 39 (H) 08/17/2017   LDLCALC 94 04/01/2020   LDLCALC 102 (H) 08/25/2019   Lab Results  Component Value Date   TSH 3.080 02/23/2019   TSH 0.599 02/04/2018    Therapeutic Level Labs: Lab Results  Component Value Date   LITHIUM 0.74 11/01/2018   LITHIUM 0.4 (L) 08/16/2018   No results found for: VALPROATE No components found for:  CBMZ  Current Medications: Current Outpatient Medications  Medication Sig Dispense Refill  . atorvastatin (LIPITOR) 20 MG tablet Take 1 tablet (20 mg total) by mouth daily. 90 tablet 1  . benazepril (LOTENSIN) 5 MG tablet Take 1 tablet (5 mg total) by mouth daily. 90 tablet 1  . busPIRone (BUSPAR) 15 MG tablet Take 1 tablet (15 mg total) by mouth 3 (three) times daily. 270 tablet 1  . Calcium Citrate-Vitamin D (CALCIUM + D PO) Take 1 tablet by mouth 2 (two) times daily.     . fexofenadine (ALLEGRA ALLERGY) 180 MG tablet Take 1 tablet (180 mg total) by mouth daily. 90 tablet 1  . fluticasone (FLONASE) 50 MCG/ACT nasal spray Place 2 sprays into both nostrils daily. 16 g 6  . gabapentin (NEURONTIN) 300 MG capsule Take 1 capsule (300 mg total) by mouth 3 (three) times daily. 270 capsule 1  . lithium 300 MG tablet  Take 2 tablets (600 mg total) by mouth at bedtime. 180 tablet 1  . metoprolol succinate (TOPROL-XL) 25 MG 24 hr tablet Take 1 tablet (25 mg total) by mouth daily. 90 tablet 1  . montelukast (SINGULAIR) 10 MG tablet Take 1 tablet (10 mg total) by mouth at bedtime. 90 tablet 3  . Multiple Vitamin (MULTIVITAMIN) tablet Take 1 tablet by mouth daily.    . Omega-3 Fatty Acids (FISH OIL) 1000 MG CAPS Take 1,000 mg by mouth 2 (two) times daily.     Marland Kitchen omeprazole (PRILOSEC) 40 MG capsule Take 1 capsule (40 mg total) by mouth daily. 90 capsule 3  . trazodone (DESYREL) 300 MG tablet Take 1 tablet (300 mg total) by mouth at bedtime. 90 tablet 1  . trospium (SANCTURA) 20 MG tablet Take 1 tablet (20 mg total) by mouth 2 (two) times daily. 60 tablet 2  . venlafaxine XR (EFFEXOR XR) 75 MG 24 hr capsule Take 3 capsules daily 270 capsule 1   No current facility-administered medications for this visit.     Musculoskeletal: Strength & Muscle Tone: unable to assess due to telemed visit Gait & Station: unable to assess due to telemed visit Patient leans: unable to assess due to telemed visit  Psychiatric Specialty Exam: Review of Systems  There were no vitals taken for this visit.There is no height or weight on file to calculate BMI.  General Appearance: Fairly groomed  Eye Contact:  Good  Speech:  Clear and Coherent and Normal Rate  Volume:  Normal  Mood: Very labile, irritable  Affect:  Labile, irritable, smiling, laughing initially and then became tearful later  Thought Process:  Goal Directed and Descriptions of Associations: Circumstantial  Orientation:  Full (Time, Place, and Person)  Thought Content: Illogical and Rumination   Suicidal Thoughts:  No  Homicidal Thoughts:  No  Memory:  Recent;   Good Remote;   Good  Judgement:  Fair  Insight:  Fair  Psychomotor Activity:  Normal  Concentration:  Concentration: Good and Attention Span: Good  Recall:  Good  Fund of Knowledge: Good  Language:  Good  Akathisia:  Negative  Handed:  Right  AIMS (if indicated): not done  Assets:  Communication Skills Desire for Improvement Financial Resources/Insurance Housing Social Support  ADL's:  Intact  Cognition: WNL  Sleep:  Better with Trazodone   Screenings: GAD-7     Office Visit from 04/01/2020 in [facility] Visit from 02/23/2019 in [facility]  Total GAD-7 Score 18 14    PHQ2-9     Office Visit from 04/01/2020 in [facility] Visit from 02/23/2019 in [facility] from 02/09/2019 in [facility] from 02/04/2018 in [facility] Visit from 08/17/2017 in [facility]  PHQ-2 Total Score 6 2 5  0 0  PHQ-9 Total Score 26 12 12  -- --       Assessment and Plan: Patient was asked to be very irritable and labile during the session.  She she reported that she is spending money on things that she does not need and asked if she could be started on Vraylar as she has heard about this medication for mood stabilization.  Patient was informed that will her prescription be sent to her pharmacy and at that point she could not make a decision of which pharmacy to choose.  Writer recommended it be sent to her local pharmacy so that she can get it as soon as possible and start taking the medication for mood stabilization. Potential side effects of medication and risks vs benefits of treatment vs non-treatment were explained and discussed. All questions were answered.   1. Bipolar I disorder, most recent episode mixed ([place])  - lithium 300 MG tablet; Take 2 tablets (600 mg total) by mouth at bedtime.  Dispense: 180 tablet; Refill: 1 - trazodone (DESYREL) 300 MG tablet; Take 1 tablet (300 mg total) by mouth at bedtime.  Dispense: 90 tablet; Refill: 1 - gabapentin (NEURONTIN) 300 MG capsule; Take 1 capsule (300 mg total) by mouth 3 (three) times daily.  Dispense: 270 capsule;  Refill: 1 - venlafaxine XR (EFFEXOR XR) 75 MG 24 hr capsule; Take 3 capsules daily  Dispense: 270 capsule; Refill: 1 - Start Vraylar 1.5 mg daily   2. Anxiety  - busPIRone (BUSPAR) 15 MG tablet; Take 1 tablet (15 mg total) by mouth 3 (three) times daily.  Dispense: 270 tablet; Refill: 1 - gabapentin (NEURONTIN) 300 MG capsule; Take 1 capsule (300 mg total) by mouth 3 (three) times daily.  Dispense: 270 capsule; Refill: 1 - venlafaxine XR (EFFEXOR XR) 75 MG 24 hr capsule; Take 3 capsules daily  Dispense: 270 capsule; Refill: 1   Continue individual therapy. Follow-up in 4 weeks.  [place] Crane, MD 04/03/2020, 4:23 PM

## 2020-04-05 ENCOUNTER — Telehealth (HOSPITAL_COMMUNITY): Payer: Self-pay | Admitting: *Deleted

## 2020-04-05 LAB — UA/M W/RFLX CULTURE, ROUTINE
Bilirubin, UA: NEGATIVE
Glucose, UA: NEGATIVE
Ketones, UA: NEGATIVE
Nitrite, UA: NEGATIVE
Protein,UA: NEGATIVE
Specific Gravity, UA: 1.02 (ref 1.005–1.030)
Urobilinogen, Ur: 0.2 mg/dL (ref 0.2–1.0)
pH, UA: 7.5 (ref 5.0–7.5)

## 2020-04-05 LAB — MICROSCOPIC EXAMINATION

## 2020-04-05 LAB — URINE CULTURE, REFLEX

## 2020-04-05 NOTE — Telephone Encounter (Signed)
[employer]  Approval Prescription Drug Coverage    cariprazine (VRAYLAR) 1.5 mg capsule   Authorization Good Until 09/13/2020

## 2020-04-08 ENCOUNTER — Other Ambulatory Visit: Payer: Self-pay | Admitting: Family Medicine

## 2020-04-08 MED ORDER — SULFAMETHOXAZOLE-TRIMETHOPRIM 800-160 MG PO TABS: 1.0000 | ORAL_TABLET | Freq: Two times a day (BID) | ORAL | 0 refills | Status: DC

## 2020-04-22 ENCOUNTER — Ambulatory Visit: Payer: Self-pay | Admitting: Family Medicine

## 2020-04-22 ENCOUNTER — Emergency Department
Admission: EM | Admit: 2020-04-22 | Discharge: 2020-04-23 | Disposition: A | Discharge status: Home / Self Care | Payer: Medicare PPO | Attending: Emergency Medicine | Admitting: Emergency Medicine

## 2020-04-22 ENCOUNTER — Other Ambulatory Visit: Payer: Self-pay

## 2020-04-22 ENCOUNTER — Encounter: Payer: Self-pay | Admitting: Emergency Medicine

## 2020-04-22 ENCOUNTER — Telehealth: Payer: Self-pay | Admitting: Family Medicine

## 2020-04-22 DIAGNOSIS — Z79899 Other long term (current) drug therapy: Secondary | ICD-10-CM | POA: Insufficient documentation

## 2020-04-22 DIAGNOSIS — F4329 Adjustment disorder with other symptoms: Secondary | ICD-10-CM

## 2020-04-22 DIAGNOSIS — Z87891 Personal history of nicotine dependence: Secondary | ICD-10-CM | POA: Diagnosis not present

## 2020-04-22 DIAGNOSIS — R4182 Altered mental status, unspecified: Secondary | ICD-10-CM | POA: Diagnosis not present

## 2020-04-22 DIAGNOSIS — R251 Tremor, unspecified: Secondary | ICD-10-CM | POA: Diagnosis present

## 2020-04-22 DIAGNOSIS — I129 Hypertensive chronic kidney disease with stage 1 through stage 4 chronic kidney disease, or unspecified chronic kidney disease: Secondary | ICD-10-CM | POA: Insufficient documentation

## 2020-04-22 DIAGNOSIS — N183 Chronic kidney disease, stage 3 unspecified: Secondary | ICD-10-CM | POA: Insufficient documentation

## 2020-04-22 LAB — URINE DRUG SCREEN, QUALITATIVE (ARMC ONLY)
Amphetamines, Ur Screen: NOT DETECTED
Barbiturates, Ur Screen: NOT DETECTED
Benzodiazepine, Ur Scrn: NOT DETECTED
Cannabinoid 50 Ng, Ur ~~LOC~~: NOT DETECTED
Cocaine Metabolite,Ur ~~LOC~~: NOT DETECTED
MDMA (Ecstasy)Ur Screen: NOT DETECTED
Methadone Scn, Ur: NOT DETECTED
Opiate, Ur Screen: NOT DETECTED
Phencyclidine (PCP) Ur S: NOT DETECTED
Tricyclic, Ur Screen: NOT DETECTED

## 2020-04-22 LAB — COMPREHENSIVE METABOLIC PANEL
ALT: 44 U/L (ref 0–44)
AST: 27 U/L (ref 15–41)
Albumin: 4.4 g/dL (ref 3.5–5.0)
Alkaline Phosphatase: 93 U/L (ref 38–126)
Anion gap: 6 (ref 5–15)
BUN: 14 mg/dL (ref 8–23)
CO2: 28 mmol/L (ref 22–32)
Calcium: 9.7 mg/dL (ref 8.9–10.3)
Chloride: 104 mmol/L (ref 98–111)
Creatinine, Ser: 0.94 mg/dL (ref 0.44–1.00)
GFR calc Af Amer: >60 mL/min (ref >60)
GFR calc non Af Amer: >60 mL/min (ref >60)
Glucose, Bld: 116 mg/dL — ABNORMAL HIGH (ref 70–99)
Potassium: 4.5 mmol/L (ref 3.5–5.1)
Sodium: 138 mmol/L (ref 135–145)
Total Bilirubin: 0.6 mg/dL (ref 0.3–1.2)
Total Protein: 7.5 g/dL (ref 6.5–8.1)

## 2020-04-22 LAB — URINALYSIS, ROUTINE W REFLEX MICROSCOPIC
Bacteria, UA: NONE SEEN
Bilirubin Urine: NEGATIVE
Glucose, UA: NEGATIVE mg/dL
Hgb urine dipstick: NEGATIVE
Ketones, ur: NEGATIVE mg/dL
Nitrite: NEGATIVE
Protein, ur: NEGATIVE mg/dL
Specific Gravity, Urine: 1.017 (ref 1.005–1.030)
pH: 5 (ref 5.0–8.0)

## 2020-04-22 LAB — CBC
HCT: 41.2 % (ref 36.0–46.0)
Hemoglobin: 14.1 g/dL (ref 12.0–15.0)
MCH: 32.6 pg (ref 26.0–34.0)
MCHC: 34.2 g/dL (ref 30.0–36.0)
MCV: 95.4 fL (ref 80.0–100.0)
Platelets: 239 10*3/uL (ref 150–400)
RBC: 4.32 MIL/uL (ref 3.87–5.11)
RDW: 12.4 % (ref 11.5–15.5)
WBC: 8.9 10*3/uL (ref 4.0–10.5)
nRBC: 0 % (ref 0.0–0.2)

## 2020-04-22 LAB — ETHANOL: Alcohol, Ethyl (B): <10 mg/dL (ref <10)

## 2020-04-22 LAB — ACETAMINOPHEN LEVEL: Acetaminophen (Tylenol), Serum: <10 ug/mL — ABNORMAL LOW (ref 10–30)

## 2020-04-22 LAB — SALICYLATE LEVEL: Salicylate Lvl: <7 mg/dL — ABNORMAL LOW (ref 7.0–30.0)

## 2020-04-22 NOTE — Telephone Encounter (Signed)
Copied from [place] [id number]. Topic: Appointment Scheduling - Scheduling Inquiry for Clinic >> Apr 22, 2020  8:41 AM Elizabeth Malone wrote: Patient would like to know when her follow up is due for potassium check.Please advise.

## 2020-04-22 NOTE — ED Triage Notes (Addendum)
Patient presents to the ED with tremors and auditory and visual hallucinations that began yesterday morning around 5am.  Patient states she was very foggy headed while driving and was seeing lanes that weren't really there.  Patient states she couldn't hold food to eat yesterday.  Patient needed her husband's help getting dressed last night.  Patient states she has been feeling better since she had a shower this morning.  Patient states her doctor's office told her recently her potassium levels were too high.  Patient has history of Bipolar I.  Patient reports she was seen in the ED with tremors in Feb. Of 2020.  Patient reports feeling stressed over the past week.  Patient reports missing 1 dose of lithium on Saturday night.  Patient was put on a new medication called mylar, took it for approx. 1 week and stopped takiing it on Friday stating, "I didn't like how it was making me feel."

## 2020-04-22 NOTE — Telephone Encounter (Signed)
Pt reports went to visit daughter in [place] yesterday, while driving home," Started jerking, shaking, talking to myself, stuttering, couldn't think straight." States "Like I was under the influence but I was not." Reports therapist started her on new bipolar med , first dose last Saturday, stopped taking Friday.  Reports heart palpitations. States once home "Couldn't even feed myself shaking so hard." Presently symptoms remain, shaking not as bad. Pt hyper-verbal, speech halting, stuttering at times. Sounds very anxious. States "Still feel confused."  Advised ED, pt states she may call therapist. Advised pt that was an option, but reiterated need for ED eval, pt verbalizes understanding.  Pt also concerned about K+ level, states she was to have rechecked. Unsure if pt will follow ED disposition. CB# [place]  Reason for Disposition . Patient sounds very sick or weak to the triager  Answer Assessment - Initial Assessment Questions 1. CONCERN: "What happened that made you call today?"     Shaking, stuttering, confused 2. ANXIETY SYMPTOM SCREENING: "Can you describe how you have been feeling?"  (e.g., tense, restless, panicky, anxious, keyed up, trouble sleeping, trouble concentrating)    All 3. ONSET: "How long have you been feeling this way?" Since yesterday    4. RECURRENT: "Have you felt this way before?"  If Yes, ask: "What happened that time?" "What helped these feelings go away in the past?"      Yes, last feb.  Went to ED 5. RISK OF HARM - SUICIDAL IDEATION:  "Do you ever have thoughts of hurting or killing yourself?"  (e.g., yes, no, no but preoccupation with thoughts about death)   - INTENT:  "Do you have thoughts of hurting or killing yourself right NOW?" (e.g., yes, no, N/A)   - PLAN: "Do you have a specific plan for how you would do this?" (e.g., gun, knife, overdose, no plan, N/A)     no 6. RISK OF HARM - HOMICIDAL IDEATION:  "Do you ever have thoughts of hurting or killing  someone else?"  (e.g., yes, no, no but preoccupation with thoughts about death)   - INTENT:  "Do you have thoughts of hurting or killing someone right NOW?" (e.g., yes, no, N/A)   - PLAN: "Do you have a specific plan for how you would do this?" (e.g., gun, knife, no plan, N/A)      *No Answer* 7. FUNCTIONAL IMPAIRMENT: "How have things been going for you overall? Have you had more difficulty than usual doing your normal daily activities?"  (e.g., better, same, worse; self-care, school, work, interactions)     no 8. SUPPORT: "Who is with you now?" "Who do you live with?" "Do you have family or friends who you can talk to?"      Husband 7. THERAPIST: "Do you have a counselor or therapist? Name?"     yes 10. STRESSORS: "Has there been any new stress or recent changes in your life?"       Visited daughter 74. CAFFEINE USE: "Do you drink caffeinated beverages, and how much each day?" (e.g., coffee, tea, colas)       None today 12. ALCOHOL USE OR SUBSTANCE USE (DRUG USE): "Do you drink alcohol or use any illegal drugs?"      no 13. OTHER SYMPTOMS: "Do you have any other physical symptoms right now?" (e.g., chest pain, palpitations, difficulty breathing, fever)      Palpations yesterday, tremors "Jerking" stuttering today, "Confused."  Protocols used: ANXIETY AND PANIC ATTACK-A-AH

## 2020-04-22 NOTE — Telephone Encounter (Signed)
Called and LVM for patient to return my call. °

## 2020-04-22 NOTE — Telephone Encounter (Signed)
Noted, thank you

## 2020-04-23 ENCOUNTER — Emergency Department: Payer: Medicare PPO

## 2020-04-23 LAB — LITHIUM LEVEL: Lithium Lvl: 0.72 mmol/L (ref 0.60–1.20)

## 2020-04-23 MED ORDER — ACETAMINOPHEN 500 MG PO TABS
1000.0000 mg | ORAL_TABLET | Freq: Once | ORAL | Status: AC
  Administered 2020-04-23: 1000 mg via ORAL

## 2020-04-23 NOTE — Telephone Encounter (Signed)
Called and LVM for patient to return call to schedule a lab recheck.

## 2020-04-23 NOTE — ED Provider Notes (Signed)
[facility] Emergency Department Provider Note  ____________________________________________  Time seen: Approximately 12:09 AM  I have reviewed the triage vital signs and the nursing notes.   HISTORY  Chief Complaint Tremors and Altered Mental Status   HPI Elizabeth Malone is a 69 y.o. female with a history of anxiety, bipolar, chronic kidney disease, tremors, hypertension, hyperlipidemia who presents for evaluation of anxiety.  Patient reports that she was in [place] over the weekend taking care of 5 grandkids while the parents had a weekend off.  She reports that she spent the whole time cooking, running up and down the three-story home after the kids.  She reports that she felt extremely overwhelmed and anxious.  She returned home yesterday and while driving home she started to have hallucinations of people hiding behind trees on the road. She reports having had hallucinations in the past in the setting of feeling sad or overwhelmed. The hallucinations have stopped since she has been home.  She has been extremely fatigued.  Has been crying.  She denies feeling depressed.  She feels that is only stress and feeling overwhelmed.  She has been compliant with her medications.  She does have a psychiatrist.  She is also complaining of a mild to moderate throbbing frontal headache.  She has a history of similar headaches.  Has been taking OTC meds for it. Denies any drugs or ingestion.s Denies SI or HI. No CP, SOB, abd pain, fever, N/V/D.   Past Medical History:  Diagnosis Date  . Anxiety   . Bipolar disorder ([place])   . CKD (chronic kidney disease) stage 3, GFR 30-59 ml/min   . Complication of anesthesia   . Depression   . GERD (gastroesophageal reflux disease)    occasionally has to use tums  . Headache   . History of diverticulosis   . Hyperlipidemia   . Overactive bladder   . PONV (postoperative nausea and vomiting)   . Substance abuse ([place])    alcohol     Patient Active Problem List   Diagnosis Date Noted  . Anxiety 08/29/2019  . Allergic rhinitis 02/23/2019  . Benign neoplasm of ascending colon   . Rectal polyp   . Overactive bladder 08/17/2017  . Advanced care planning/counseling discussion 02/02/2017  . Bipolar disorder ([place]) 08/05/2015  . Essential hypertension 08/05/2015  . Hypercholesteremia 08/05/2015    Past Surgical History:  Procedure Laterality Date  . COLONOSCOPY WITH PROPOFOL N/A 10/07/2017   Procedure: COLONOSCOPY WITH PROPOFOL;  Surgeon: [name], MD;  Location: [place];  Service: Endoscopy;  Laterality: N/A;  . FOOT SURGERY Bilateral   . KNEE SURGERY Left   . POLYPECTOMY  10/07/2017   Procedure: POLYPECTOMY;  Surgeon: [name], MD;  Location: [place];  Service: Endoscopy;;  . TONSILLECTOMY AND ADENOIDECTOMY      Prior to Admission medications   Medication Sig Start Date End Date Taking? Authorizing Provider  atorvastatin (LIPITOR) 20 MG tablet Take 1 tablet (20 mg total) by mouth daily. 04/01/20   Volney American, PA-C  benazepril (LOTENSIN) 5 MG tablet Take 1 tablet (5 mg total) by mouth daily. 02/21/20   Volney American, PA-C  busPIRone (BUSPAR) 15 MG tablet Take 1 tablet (15 mg total) by mouth 3 (three) times daily. 04/03/20   Nevada Crane, MD  Calcium Citrate-Vitamin D (CALCIUM + D PO) Take 1 tablet by mouth 2 (two) times daily.     [provider]  cariprazine (VRAYLAR) capsule Take  1 capsule (1.5 mg total) by mouth daily. 04/03/20   Nevada Crane, MD  fexofenadine [facility] ALLERGY) 180 MG tablet Take 1 tablet (180 mg total) by mouth daily. 01/25/20   Volney American, PA-C  fluticasone [facility]) 50 MCG/ACT nasal spray Place 2 sprays into both nostrils daily. 08/25/19   Volney American, PA-C  gabapentin (NEURONTIN) 300 MG capsule Take 1 capsule (300 mg total) by mouth 3 (three) times daily. 04/03/20   Nevada Crane, MD  lithium 300 MG tablet Take 2  tablets (600 mg total) by mouth at bedtime. 04/03/20   Nevada Crane, MD  metoprolol succinate (TOPROL-XL) 25 MG 24 hr tablet Take 1 tablet (25 mg total) by mouth daily. 04/01/20   Volney American, PA-C  montelukast (SINGULAIR) 10 MG tablet Take 1 tablet (10 mg total) by mouth at bedtime. 12/13/19   Volney American, PA-C  Multiple Vitamin (MULTIVITAMIN) tablet Take 1 tablet by mouth daily.    [provider]  Omega-3 Fatty Acids (FISH OIL) 1000 MG CAPS Take 1,000 mg by mouth 2 (two) times daily.     [provider]  omeprazole (PRILOSEC) 40 MG capsule Take 1 capsule (40 mg total) by mouth daily. 04/01/20   Volney American, PA-C  sulfamethoxazole-trimethoprim (BACTRIM DS) 800-160 MG tablet Take 1 tablet by mouth 2 (two) times daily. 04/08/20   Volney American, PA-C  trazodone (DESYREL) 300 MG tablet Take 1 tablet (300 mg total) by mouth at bedtime. 04/03/20   Nevada Crane, MD  trospium (SANCTURA) 20 MG tablet Take 1 tablet (20 mg total) by mouth 2 (two) times daily. 04/01/20   Volney American, PA-C  venlafaxine XR (EFFEXOR XR) 75 MG 24 hr capsule Take 3 capsules daily 04/03/20   Nevada Crane, MD    Allergies Ace inhibitors, Fluoxetine, Hydrocodone-chlorpheniramine, Paroxetine hcl, Penicillin g benzathine, Penicillin v potassium, Clindamycin hcl, and Lamotrigine  Family History  Problem Relation Age of Onset  . Alzheimer's disease Mother   . Bipolar disorder Mother   . Depression Mother   . Heart attack Father   . Diabetes Brother   . Obesity Brother   . Depression Maternal Grandfather   . Depression Daughter   . Stroke Maternal Grandmother   . Stroke Paternal Grandmother   . Heart attack Paternal Grandfather   . Breast cancer Neg Hx     Social History Social History   Tobacco Use  . Smoking status: Former Smoker    Types: Cigarettes    Start date: 07/09/1967    Quit date: 07/08/1985    Years since quitting: 34.8  . Smokeless  tobacco: Never Used  Vaping Use  . Vaping Use: Never used  Substance Use Topics  . Alcohol use: Yes    Alcohol/week: 3.0 standard drinks    Types: 3 Shots of liquor per week    Comment: occassional  . Drug use: No    Review of Systems  Constitutional: Negative for fever. Eyes: Negative for visual changes. ENT: Negative for sore throat. Neck: No neck pain  Cardiovascular: Negative for chest pain. Respiratory: Negative for shortness of breath. Gastrointestinal: Negative for abdominal pain, vomiting or diarrhea. Genitourinary: Negative for dysuria. Musculoskeletal: Negative for back pain. Skin: Negative for rash. Neurological: Negative for headaches, weakness or numbness. Psych: No SI or HI. + sad and anxious, visual hallucinations  ____________________________________________   PHYSICAL EXAM:  VITAL SIGNS: ED Triage Vitals  Enc Vitals Group     BP 04/22/20 1049 140/67  Pulse Rate 04/22/20 1049 85     Resp 04/22/20 1049 18     Temp 04/22/20 1049 98.9 F (37.2 C)     Temp Source 04/22/20 1049 Oral     SpO2 04/22/20 1049 98 %     Weight 04/22/20 1054 210 lb (95.3 kg)     Height 04/22/20 1054 5\' 5"  (1.651 m)     Head Circumference --      Peak Flow --      Pain Score 04/22/20 1054 0     Pain Loc --      Pain Edu? --      Excl. in [place]? --     Constitutional: Alert and oriented, teary but in no apparent distress. HEENT:      Head: Normocephalic and atraumatic.         Eyes: Conjunctivae are normal. Sclera is non-icteric.       Mouth/Throat: Mucous membranes are moist.       Neck: Supple with no signs of meningismus. Cardiovascular: Regular rate and rhythm.  Respiratory: Normal respiratory effort. Lungs are clear to auscultation bilaterally.  Gastrointestinal: Soft, non tender. Musculoskeletal: No edema, cyanosis, or erythema of extremities. Neurologic: Normal speech and language. Face is symmetric. Moving all extremities. No gross focal neurologic deficits are  appreciated. Skin: Skin is warm, dry and intact. No rash noted. Psychiatric: Mood and affect are depressed. Speech and behavior are normal.  ____________________________________________   LABS (all labs ordered are listed, but only abnormal results are displayed)  Labs Reviewed  COMPREHENSIVE METABOLIC PANEL - Abnormal; Notable for the following components:      Result Value   Glucose, Bld 116 (*)    All other components within normal limits  SALICYLATE LEVEL - Abnormal; Notable for the following components:   Salicylate Lvl <6.4 (*)    All other components within normal limits  ACETAMINOPHEN LEVEL - Abnormal; Notable for the following components:   Acetaminophen (Tylenol), Serum <10 (*)    All other components within normal limits  URINALYSIS, ROUTINE W REFLEX MICROSCOPIC - Abnormal; Notable for the following components:   Color, Urine YELLOW (*)    APPearance HAZY (*)    Leukocytes,Ua SMALL (*)    All other components within normal limits  ETHANOL  CBC  URINE DRUG SCREEN, QUALITATIVE ([facility] ONLY)  LITHIUM LEVEL   ____________________________________________  EKG  none  ____________________________________________  RADIOLOGY  I have personally reviewed the images performed during this visit and I agree with the Radiologist's read.   Interpretation by Radiologist:  CT Head Wo Contrast  Result Date: 04/23/2020 CLINICAL DATA:  Auditory and visual hallucinations EXAM: CT HEAD WITHOUT CONTRAST TECHNIQUE: Contiguous axial images were obtained from the base of the skull through the vertex without intravenous contrast. COMPARISON:  None. FINDINGS: Brain: No evidence of acute territorial infarction, hemorrhage, hydrocephalus,extra-axial collection or mass lesion/mass effect. Normal gray-white differentiation. Ventricles are normal in size and contour. Vascular: No hyperdense vessel or unexpected calcification. Skull: The skull is intact. No fracture or focal lesion identified.  Sinuses/Orbits: The visualized paranasal sinuses and mastoid air cells are clear. The orbits and globes intact. Other: None IMPRESSION: No acute intracranial abnormality. Electronically Signed   By: Prudencio Pair M.D.   On: 04/23/2020 00:28     ____________________________________________   PROCEDURES  Procedure(s) performed: None Procedures Critical Care performed:  None ____________________________________________   INITIAL IMPRESSION / ASSESSMENT AND PLAN / ED COURSE   69 y.o. female with a history of anxiety, bipolar,  chronic kidney disease, tremors, hypertension, hyperlipidemia who presents for evaluation of anxiety, stress, feeling overwhelmed.  Patient is teary while talking to me but pleasant and cooperative.  She denies any suicidal homicidal ideations.  She feels extremely stressed and tired after spending a week in [place] with caring for 5 grandchildren ages 65-17.  She reports that since she has been home for the last 24 hours she has been feeling a lot better.  She called her doctor earlier this morning who recommended her come to the emergency room for evaluation.  She denies SI or HI or any prior history of suicide attempts.  She does have a psychiatrist and she is on lithium and Effexor and endorses compliance with it.  Her labs show no acute abnormalities.  Her UA shows no sign of UTI.  Her head CT showed no acute abnormalities, confirmed by radiology.  She feels improved.  Her exam is benign.  She was given Tylenol for her headache.  I offered for her to stay until the morning to speak with a psychiatrist but she does have a psychiatrist at home.  She says that she is about to have a Zoom visit with her psychiatrist within the next few days and she politely declined staying into the morning specially since she has been here for 14.5 hours awaiting to be evaluated.  Her lithium levels are therapeutic.  Her old medical records were reviewed.  At this time patient is stable for  discharge home with follow-up with her psychiatrist.  Discussed my return precautions.      _____________________________________________ Please note:  Patient was evaluated in Emergency Department today for the symptoms described in the history of present illness. Patient was evaluated in the context of the global COVID-19 pandemic, which necessitated consideration that the patient might be at risk for infection with the SARS-CoV-2 virus that causes COVID-19. Institutional protocols and algorithms that pertain to the evaluation of patients at risk for COVID-19 are in a state of rapid change based on information released by regulatory bodies including the CDC and federal and state organizations. These policies and algorithms were followed during the patient's care in the ED.  Some ED evaluations and interventions may be delayed as a result of limited staffing during the pandemic.   [place] Controlled Substance Database was reviewed by me. ____________________________________________   FINAL CLINICAL IMPRESSION(S) / ED DIAGNOSES   Final diagnoses:  Stress and adjustment reaction      NEW MEDICATIONS STARTED DURING THIS VISIT:  ED Discharge Orders    None       Note:  This document was prepared using Dragon voice recognition software and may include unintentional dictation errors.    Alfred Levins, [place], MD 04/23/20 [phone]

## 2020-04-23 NOTE — Discharge Instructions (Addendum)
You have been seen in the Emergency Department (ED)  today for a psychiatric complaint.  You have been evaluated by psychiatry and we believe you are safe to be discharged from the hospital.   ° °Please return to the Emergency Department (ED)  immediately if you have ANY thoughts of hurting yourself or anyone else, so that we may help you. ° °Please avoid alcohol and drug use. ° °Follow up with your doctor and/or therapist as soon as possible regarding today's ED  visit.  ° °You may call crisis hotline for [place] at [phone]. ° °

## 2020-04-24 NOTE — Telephone Encounter (Signed)
Called and spoke to patient. She states that she was recently seen at the ER and her potassium was checked then. Normal result per chart. While on the phone, patient asked if Apolonio Schneiders would start her back on oxybutnin for her bladder. She states she is leaking and really feels like she needs this again.  Patient also asked some of what has been on with her could come from a urine infection. Patient states she has the RX at home for sulfa that we gave her a few weeks ago and told her not to take. Wants to know if Apolonio Schneiders thinks she should take it now.

## 2020-04-25 MED ORDER — OXYBUTYNIN CHLORIDE ER 10 MG PO TB24: 10.0000 mg | ORAL_TABLET | Freq: Every day | ORAL | 0 refills | Status: DC

## 2020-04-25 NOTE — Telephone Encounter (Signed)
Will d/c trospium and restart oxybutynin if she'd like. Ok to take the bactrim she's got at home, follow up for recheck if not resolving

## 2020-04-25 NOTE — Telephone Encounter (Signed)
Patient notified of Rachel's message.  

## 2020-04-25 NOTE — Addendum Note (Signed)
Addended by: Meryle Ready on: 04/25/2020 04:38 AM   Modules accepted: Orders

## 2020-05-08 ENCOUNTER — Encounter (HOSPITAL_COMMUNITY): Payer: Self-pay | Admitting: Psychiatry

## 2020-05-08 ENCOUNTER — Telehealth (INDEPENDENT_AMBULATORY_CARE_PROVIDER_SITE_OTHER): Payer: Medicare PPO | Admitting: Psychiatry

## 2020-05-08 ENCOUNTER — Other Ambulatory Visit: Payer: Self-pay

## 2020-05-08 DIAGNOSIS — F3160 Bipolar disorder, current episode mixed, unspecified: Secondary | ICD-10-CM | POA: Diagnosis not present

## 2020-05-08 DIAGNOSIS — F419 Anxiety disorder, unspecified: Secondary | ICD-10-CM | POA: Diagnosis not present

## 2020-05-08 DIAGNOSIS — F3132 Bipolar disorder, current episode depressed, moderate: Secondary | ICD-10-CM | POA: Diagnosis not present

## 2020-05-08 MED ORDER — TRAZODONE HCL 300 MG PO TABS: 300.0000 mg | ORAL_TABLET | Freq: Every day | ORAL | 1 refills | Status: DC

## 2020-05-08 MED ORDER — LITHIUM CARBONATE 300 MG PO TABS: ORAL_TABLET | ORAL | 1 refills | Status: DC

## 2020-05-08 MED ORDER — VENLAFAXINE HCL ER 75 MG PO CP24: ORAL_CAPSULE | ORAL | 1 refills | Status: DC

## 2020-05-08 MED ORDER — BUSPIRONE HCL 15 MG PO TABS: 15.0000 mg | ORAL_TABLET | Freq: Three times a day (TID) | ORAL | 1 refills | Status: DC

## 2020-05-08 MED ORDER — GABAPENTIN 300 MG PO CAPS: 300.0000 mg | ORAL_CAPSULE | Freq: Three times a day (TID) | ORAL | 1 refills | Status: DC

## 2020-05-08 NOTE — Progress Notes (Signed)
[facility] MD OP Progress Note  Virtual Visit via Video Note  I connected with Elizabeth [facility] on 05/08/20 at  3:40 PM EDT by a video enabled telemedicine application and verified that I am speaking with the correct person using two identifiers.  Location: Patient: Home Provider: Clinic   I discussed the limitations of evaluation and management by telemedicine and the availability of in person appointments. The patient expressed understanding and agreed to proceed.  I provided 21 minutes of non-face-to-face time during this encounter.    05/08/2020 4:15 PM Elizabeth [facility]  MRN:  [id number]  Chief Complaint:  " I am doing better but have not gone out of my home in last 1.5 weeks."  HPI: Patient was not as irritable as she was at the time of her last visit.  She stated that she was feeling better however she feels that she is currently undergoing a depressive phase.  She stated that she did not do well on the Vraylar.  She tried it for about a week and did not feel that it helped her much.  She stated that she had a spending around $3800 in that week alone.  She has not informed her husband regarding that. She stated that lately she has been feeling sad and isolated.  She has not stepped out of her house for about 1/2 weeks now. She also informed having a ED visit a few weeks ago.  She stated that she had to sit in the ED for about 14 hours and they did not know what was going on with her either. EMR was reviewed and it was noted that she was seen in the ED on August 9.  She was diagnosed with stress and adjustment issues in the context of her recent trip to her daughter in [place].  Her lab work was unremarkable.  Her lithium level was 0.7, WNL.  Her CT brain without contrast did not reveal any abnormal findings.  Patient became tearful as the session progressed.  Patient was receptive to the feedback of increasing the dose of lithium to target her depressive symptoms and also to prevent future manic  episodes.  She denied any suicidal or homicidal ideations.     Visit Diagnosis:    ICD-10-CM   1. Bipolar 1 disorder, depressed, moderate ([place])  F31.32     Past Psychiatric History: Bipolar disorder, anxiety  Past Medical History:  Past Medical History:  Diagnosis Date  . Anxiety   . Bipolar disorder ([place])   . CKD (chronic kidney disease) stage 3, GFR 30-59 ml/min   . Complication of anesthesia   . Depression   . GERD (gastroesophageal reflux disease)    occasionally has to use tums  . Headache   . History of diverticulosis   . Hyperlipidemia   . Overactive bladder   . PONV (postoperative nausea and vomiting)   . Substance abuse ([place])    alcohol    Past Surgical History:  Procedure Laterality Date  . COLONOSCOPY WITH PROPOFOL N/A 10/07/2017   Procedure: COLONOSCOPY WITH PROPOFOL;  Surgeon: [name], MD;  Location: [facility];  Service: Endoscopy;  Laterality: N/A;  . FOOT SURGERY Bilateral   . KNEE SURGERY Left   . POLYPECTOMY  10/07/2017   Procedure: POLYPECTOMY;  Surgeon: [name], MD;  Location: [facility];  Service: Endoscopy;;  . TONSILLECTOMY AND ADENOIDECTOMY      Family Psychiatric History: see below   Family History:  Family History  Problem Relation  Age of Onset  . Alzheimer's disease Mother   . Bipolar disorder Mother   . Depression Mother   . Heart attack Father   . Diabetes Brother   . Obesity Brother   . Depression Maternal Grandfather   . Depression Daughter   . Stroke Maternal Grandmother   . Stroke Paternal Grandmother   . Heart attack Paternal Grandfather   . Breast cancer Neg Hx     Social History:  Social History   Socioeconomic History  . Marital status: Married    Spouse name: Not on file  . Number of children: 2  . Years of education: Not on file  . Highest education level: Bachelor's degree (e.g., BA, AB, BS)  Occupational History  . Occupation: retired   Tobacco Use  . Smoking status: Former  Smoker    Types: Cigarettes    Start date: 07/09/1967    Quit date: 07/08/1985    Years since quitting: 34.8  . Smokeless tobacco: Never Used  Vaping Use  . Vaping Use: Never used  Substance and Sexual Activity  . Alcohol use: Yes    Alcohol/week: 3.0 standard drinks    Types: 3 Shots of liquor per week    Comment: occassional  . Drug use: No  . Sexual activity: Not Currently  Other Topics Concern  . Not on file  Social History Narrative  . Not on file   Social Determinants of Health   Financial Resource Strain: Low Risk   . Difficulty of Paying Living Expenses: Not hard at all  Food Insecurity: No Food Insecurity  . Worried About Charity fundraiser in the Last Year: Never true  . Ran Out of Food in the Last Year: Never true  Transportation Needs: No Transportation Needs  . Lack of Transportation (Medical): No  . Lack of Transportation (Non-Medical): No  Physical Activity: Inactive  . Days of Exercise per Week: 0 days  . Minutes of Exercise per Session: 0 min  Stress: Stress Concern Present  . Feeling of Stress : Very much  Social Connections:   . Frequency of Communication with Friends and Family: Not on file  . Frequency of Social Gatherings with Friends and Family: Not on file  . Attends Religious Services: Not on file  . Active Member of Clubs or Organizations: Not on file  . Attends Archivist Meetings: Not on file  . Marital Status: Not on file    Allergies:  Allergies  Allergen Reactions  . Ace Inhibitors Other (See Comments)  . Fluoxetine Other (See Comments)  . Hydrocodone-Chlorpheniramine Other (See Comments)  . Paroxetine Hcl Other (See Comments)  . Penicillin G Benzathine Other (See Comments)  . Penicillin V Potassium Other (See Comments)  . Clindamycin Hcl Rash and Other (See Comments)    Katherina Right' syndrome  . Lamotrigine Rash    Metabolic Disorder Labs: Lab Results  Component Value Date   HGBA1C 4.9 08/17/2017   No results  found for: PROLACTIN Lab Results  Component Value Date   CHOL 201 (H) 04/01/2020   TRIG 283 (H) 04/01/2020   HDL 60 04/01/2020   CHOLHDL 3.2 02/23/2019   VLDL 39 (H) 08/17/2017   LDLCALC 94 04/01/2020   LDLCALC 102 (H) 08/25/2019   Lab Results  Component Value Date   TSH 3.080 02/23/2019   TSH 0.599 02/04/2018    Therapeutic Level Labs: Lab Results  Component Value Date   LITHIUM 0.72 04/22/2020   LITHIUM 0.74 11/01/2018  No results found for: VALPROATE No components found for:  CBMZ  Current Medications: Current Outpatient Medications  Medication Sig Dispense Refill  . atorvastatin (LIPITOR) 20 MG tablet Take 1 tablet (20 mg total) by mouth daily. 90 tablet 1  . benazepril (LOTENSIN) 5 MG tablet Take 1 tablet (5 mg total) by mouth daily. 90 tablet 1  . busPIRone (BUSPAR) 15 MG tablet Take 1 tablet (15 mg total) by mouth 3 (three) times daily. 270 tablet 1  . Calcium Citrate-Vitamin D (CALCIUM + D PO) Take 1 tablet by mouth 2 (two) times daily.     . cariprazine (VRAYLAR) capsule Take 1 capsule (1.5 mg total) by mouth daily. 30 capsule 1  . fexofenadine (ALLEGRA ALLERGY) 180 MG tablet Take 1 tablet (180 mg total) by mouth daily. 90 tablet 1  . fluticasone (FLONASE) 50 MCG/ACT nasal spray Place 2 sprays into both nostrils daily. 16 g 6  . gabapentin (NEURONTIN) 300 MG capsule Take 1 capsule (300 mg total) by mouth 3 (three) times daily. 270 capsule 1  . lithium 300 MG tablet Take 2 tablets (600 mg total) by mouth at bedtime. 180 tablet 1  . metoprolol succinate (TOPROL-XL) 25 MG 24 hr tablet Take 1 tablet (25 mg total) by mouth daily. 90 tablet 1  . montelukast (SINGULAIR) 10 MG tablet Take 1 tablet (10 mg total) by mouth at bedtime. 90 tablet 3  . Multiple Vitamin (MULTIVITAMIN) tablet Take 1 tablet by mouth daily.    . Omega-3 Fatty Acids (FISH OIL) 1000 MG CAPS Take 1,000 mg by mouth 2 (two) times daily.     Marland Kitchen omeprazole (PRILOSEC) 40 MG capsule Take 1 capsule (40 mg  total) by mouth daily. 90 capsule 3  . oxybutynin (DITROPAN-XL) 10 MG 24 hr tablet Take 1 tablet (10 mg total) by mouth at bedtime. 90 tablet 0  . sulfamethoxazole-trimethoprim (BACTRIM DS) 800-160 MG tablet Take 1 tablet by mouth 2 (two) times daily. 6 tablet 0  . trazodone (DESYREL) 300 MG tablet Take 1 tablet (300 mg total) by mouth at bedtime. 90 tablet 1  . venlafaxine XR (EFFEXOR XR) 75 MG 24 hr capsule Take 3 capsules daily 270 capsule 1   No current facility-administered medications for this visit.     Musculoskeletal: Strength & Muscle Tone: unable to assess due to telemed visit Gait & Station: unable to assess due to telemed visit Patient leans: unable to assess due to telemed visit  Psychiatric Specialty Exam: Review of Systems  There were no vitals taken for this visit.There is no height or weight on file to calculate BMI.  General Appearance: Fairly groomed  Eye Contact:  Good  Speech:  Clear and Coherent and Normal Rate  Volume:  Normal  Mood: Depressed, tearful  Affect: Congruent  Thought Process:  Goal Directed and Descriptions of Associations: Circumstantial  Orientation:  Full (Time, Place, and Person)  Thought Content: Logical , denied any paranoid delusions  Suicidal Thoughts:  No  Homicidal Thoughts:  No  Memory:  Recent;   Good Remote;   Good  Judgement:  Fair  Insight:  Fair  Psychomotor Activity:  Normal  Concentration:  Concentration: Good and Attention Span: Good  Recall:  Good  Fund of Knowledge: Good  Language: Good  Akathisia:  Negative  Handed:  Right  AIMS (if indicated): not done  Assets:  Communication Skills Desire for Improvement Financial Resources/Insurance Housing Social Support  ADL's:  Intact  Cognition: WNL  Sleep:  Better with Trazodone  Screenings: GAD-7     Office Visit from 04/01/2020 in [facility] Visit from [facility]/23/2019 in [facility]  Total GAD-7 Score 18 14    PHQ2-9     Office  Visit from 04/01/2020 in [facility] Visit from 02/23/2019 in [facility] from 02/09/2019 in [facility] from 02/04/2018 in [facility] Visit from 08/17/2017 in [facility]  PHQ-2 Total Score 6 2 5  0 0  PHQ-9 Total Score 26 12 12  -- --       Assessment and Plan: Patient appears to be cycling somewhat rapidly, she had a manic episode a few weeks ago and now she is undergoing depressive phase.  She was prescribed Vraylar a few weeks ago however it did not seem to be effective as per the patient.  She is receptive to increasing her dose of lithium for optimal effect.  1. Bipolar 1 disorder, depressed, moderate ([facility])  - trazodone (DESYREL) 300 MG tablet; Take 1 tablet (300 mg total) by mouth at bedtime.  Dispense: 90 tablet; Refill: 1 - venlafaxine XR (EFFEXOR XR) 75 MG 24 hr capsule; Take 3 capsules daily  Dispense: 270 capsule; Refill: 1 - gabapentin (NEURONTIN) 300 MG capsule; Take 1 capsule (300 mg total) by mouth 3 (three) times daily.  Dispense: 270 capsule; Refill: 1 -Increase lithium 300 MG tablet; Take 1 tablet in the morning and take 2 tablets at night.  Dispense: 270 tablet; Refill: 1  2. Anxiety  - busPIRone (BUSPAR) 15 MG tablet; Take 1 tablet (15 mg total) by mouth 3 (three) times daily.  Dispense: 270 tablet; Refill: 1 - venlafaxine XR (EFFEXOR XR) 75 MG 24 hr capsule; Take 3 capsules daily  Dispense: 270 capsule; Refill: 1 - gabapentin (NEURONTIN) 300 MG capsule; Take 1 capsule (300 mg total) by mouth 3 (three) times daily.  Dispense: 270 capsule; Refill: 1   Continue individual therapy. Follow-up in 1 week.  Writer will contact the patient by phone next week to see how she is doing.  [place] Crane, MD 05/08/2020, 4:15 PM

## 2020-05-16 ENCOUNTER — Telehealth (HOSPITAL_COMMUNITY): Payer: Self-pay | Admitting: Psychiatry

## 2020-05-16 NOTE — Telephone Encounter (Signed)
Writer contacted the patient to touch base with her to see how she was doing after the dose of lithium was adjusted.  Patient reported that she was feeling much better compared to a few weeks ago.  She stated that she is feeling flat in the sense that there is something that is holding her back and she is not feeling all over the place like she was.  She stated that her mood is more stable.  Her husband has also noticed that she is doing better.  She stated that for the past few weeks her driving skills have been little shaky.  She lost her way and had to take a different route which confused things more.  She stated that her mother had Alzheimer's dementia and she discussed with her brother and she has an appointment scheduled to see a neurologist at [facility] clinic in a few weeks from now. She denies any other concerns at this time. She was offered an appointment for follow-up on October 13 at 3:20 PM.

## 2020-05-27 ENCOUNTER — Other Ambulatory Visit: Payer: Self-pay

## 2020-05-27 MED ORDER — OXYBUTYNIN CHLORIDE ER 10 MG PO TB24: 10.0000 mg | ORAL_TABLET | Freq: Every day | ORAL | 0 refills | Status: DC

## 2020-05-28 ENCOUNTER — Other Ambulatory Visit: Payer: Self-pay | Admitting: Family Medicine

## 2020-05-28 NOTE — Telephone Encounter (Signed)
Routing to provider  

## 2020-05-28 NOTE — Telephone Encounter (Signed)
Requested medication (s) are due for refill today:yes  Requested medication (s) are on the active medication list:yes  Last refill: 01/25/20  #90  Daneil Dolin  Future visit scheduled: yes in 10 months  Notes to clinic:  Last filled Merrie Roof.  Last OV 04/01/20    Requested Prescriptions  Pending Prescriptions Disp Refills   ALLERGY RELIEF 180 MG tablet [Pharmacy Med Name: FEXOFENADINE 180MG  TABLETS (OTC)] 90 tablet 1    Sig: TAKE 1 TABLET(180 MG) BY MOUTH DAILY      Ear, Nose, and Throat:  Antihistamines Passed - 05/28/2020  4:21 PM      Passed - Valid encounter within last 12 months    Recent Outpatient Visits           1 month ago Essential hypertension   [facility] [place], [place]   6 months ago Overactive bladder   [facility] Roof [place], [place]   9 months ago Essential hypertension   [facility] [place], [place]   1 year ago Annual physical exam   [facility] [place], [place]   1 year ago [place], Jeannette How, MD       Future Appointments             In 10 months [facility], [place]

## 2020-06-26 ENCOUNTER — Telehealth (INDEPENDENT_AMBULATORY_CARE_PROVIDER_SITE_OTHER): Payer: Medicare PPO | Admitting: Psychiatry

## 2020-06-26 ENCOUNTER — Other Ambulatory Visit: Payer: Self-pay

## 2020-06-26 ENCOUNTER — Encounter (HOSPITAL_COMMUNITY): Payer: Self-pay | Admitting: Psychiatry

## 2020-06-26 DIAGNOSIS — F419 Anxiety disorder, unspecified: Secondary | ICD-10-CM

## 2020-06-26 DIAGNOSIS — F3175 Bipolar disorder, in partial remission, most recent episode depressed: Secondary | ICD-10-CM

## 2020-06-26 MED ORDER — VENLAFAXINE HCL ER 75 MG PO CP24: ORAL_CAPSULE | ORAL | 1 refills | Status: DC

## 2020-06-26 MED ORDER — LITHIUM CARBONATE 300 MG PO TABS: ORAL_TABLET | ORAL | 1 refills | Status: DC

## 2020-06-26 MED ORDER — TRAZODONE HCL 300 MG PO TABS: 300.0000 mg | ORAL_TABLET | Freq: Every day | ORAL | 1 refills | Status: DC

## 2020-06-26 NOTE — Progress Notes (Signed)
[place] MD OP Progress Note  Virtual Visit via Telephone Note  I connected with Elizabeth Malone on 06/26/20 at  3:20 PM EDT by telephone and verified that I am speaking with the correct person using two identifiers.  Location: Patient: home Provider: Clinic   I discussed the limitations, risks, security and privacy concerns of performing an evaluation and management service by telephone and the availability of in person appointments. I also discussed with the patient that there may be a patient responsible charge related to this service. The patient expressed understanding and agreed to proceed.   I provided 17 minutes of non-face-to-face time during this encounter.     06/26/2020 3:50 PM Elizabeth Malone  MRN:  [id number]  Chief Complaint:  " I am doing better now but wasn't doing so well before."  HPI: Patient reported that she had accidentally been taking 4 tablets of lithium instead of 3.  And as a result she developed a lot of tremors and significant impairment in her activities due to that.  She stated that after realizing that she had been taking extra tablets she cut down the dose to 2 and that helped her significantly. Since then she is doing better.  She informed that she is still participating in the church activities and has a few things lined up for this week. She informed that she is no longer having frequent crying spells.  She feels her mood is much more stable.  She did mention that she has been spending money more than usual and she needs to work on that. She denied any suicidal or homicidal ideations.    She informed that she and her husband are going to [place] this weekend to babysit the kids while her daughter is busy.  She has been seeing a therapist regularly.  She mentioned that she is looking for a new primary care provider as she has heard that her old primary care clinic is shutting down.    Visit Diagnosis:    ICD-10-CM   1. Bipolar 1 disorder, depressed,  partial remission (HCC)  F31.75 lithium 300 MG tablet    trazodone (DESYREL) 300 MG tablet    venlafaxine XR (EFFEXOR XR) 75 MG 24 hr capsule  2. Anxiety  F41.9 venlafaxine XR (EFFEXOR XR) 75 MG 24 hr capsule    Past Psychiatric History: Bipolar disorder, anxiety  Past Medical History:  Past Medical History:  Diagnosis Date  . Anxiety   . Bipolar disorder ([place])   . CKD (chronic kidney disease) stage 3, GFR 30-59 ml/min (HCC)   . Complication of anesthesia   . Depression   . GERD (gastroesophageal reflux disease)    occasionally has to use tums  . Headache   . History of diverticulosis   . Hyperlipidemia   . Overactive bladder   . PONV (postoperative nausea and vomiting)   . Substance abuse ([place])    alcohol    Past Surgical History:  Procedure Laterality Date  . COLONOSCOPY WITH PROPOFOL N/A 10/07/2017   Procedure: COLONOSCOPY WITH PROPOFOL;  Surgeon: [name], MD;  Location: [facility];  Service: Endoscopy;  Laterality: N/A;  . FOOT SURGERY Bilateral   . KNEE SURGERY Left   . POLYPECTOMY  10/07/2017   Procedure: POLYPECTOMY;  Surgeon: [name], MD;  Location: [facility];  Service: Endoscopy;;  . TONSILLECTOMY AND ADENOIDECTOMY      Family Psychiatric History: see below   Family History:  Family History  Problem Relation Age of  Onset  . Alzheimer's disease Mother   . Bipolar disorder Mother   . Depression Mother   . Heart attack Father   . Diabetes Brother   . Obesity Brother   . Depression Maternal Grandfather   . Depression Daughter   . Stroke Maternal Grandmother   . Stroke Paternal Grandmother   . Heart attack Paternal Grandfather   . Breast cancer Neg Hx     Social History:  Social History   Socioeconomic History  . Marital status: Married    Spouse name: Not on file  . Number of children: 2  . Years of education: Not on file  . Highest education level: Bachelor's degree (e.g., BA, AB, BS)  Occupational History  .  Occupation: retired   Tobacco Use  . Smoking status: Former Smoker    Types: Cigarettes    Start date: 07/09/1967    Quit date: 07/08/1985    Years since quitting: 34.9  . Smokeless tobacco: Never Used  Vaping Use  . Vaping Use: Never used  Substance and Sexual Activity  . Alcohol use: Yes    Alcohol/week: 3.0 standard drinks    Types: 3 Shots of liquor per week    Comment: occassional  . Drug use: No  . Sexual activity: Not Currently  Other Topics Concern  . Not on file  Social History Narrative  . Not on file   Social Determinants of Health   Financial Resource Strain: Low Risk   . Difficulty of Paying Living Expenses: Not hard at all  Food Insecurity: No Food Insecurity  . Worried About Charity fundraiser in the Last Year: Never true  . Ran Out of Food in the Last Year: Never true  Transportation Needs: No Transportation Needs  . Lack of Transportation (Medical): No  . Lack of Transportation (Non-Medical): No  Physical Activity: Inactive  . Days of Exercise per Week: 0 days  . Minutes of Exercise per Session: 0 min  Stress: Stress Concern Present  . Feeling of Stress : Very much  Social Connections:   . Frequency of Communication with Friends and Family: Not on file  . Frequency of Social Gatherings with Friends and Family: Not on file  . Attends Religious Services: Not on file  . Active Member of Clubs or Organizations: Not on file  . Attends Archivist Meetings: Not on file  . Marital Status: Not on file    Allergies:  Allergies  Allergen Reactions  . Ace Inhibitors Other (See Comments)  . Fluoxetine Other (See Comments)  . Hydrocodone-Chlorpheniramine Other (See Comments)  . Paroxetine Hcl Other (See Comments)  . Penicillin G Benzathine Other (See Comments)  . Penicillin V Potassium Other (See Comments)  . Clindamycin Hcl Rash and Other (See Comments)    Katherina Right' syndrome  . Lamotrigine Rash    Metabolic Disorder Labs: Lab Results   Component Value Date   HGBA1C 4.9 08/17/2017   No results found for: PROLACTIN Lab Results  Component Value Date   CHOL 201 (H) 04/01/2020   TRIG 283 (H) 04/01/2020   HDL 60 04/01/2020   CHOLHDL 3.2 02/23/2019   VLDL 39 (H) 08/17/2017   LDLCALC 94 04/01/2020   LDLCALC 102 (H) 08/25/2019   Lab Results  Component Value Date   TSH 3.080 02/23/2019   TSH 0.599 02/04/2018    Therapeutic Level Labs: Lab Results  Component Value Date   LITHIUM 0.72 04/22/2020   LITHIUM 0.74 11/01/2018   No results  found for: VALPROATE No components found for:  CBMZ  Current Medications: Current Outpatient Medications  Medication Sig Dispense Refill  . ALLERGY RELIEF 180 MG tablet TAKE 1 TABLET(180 MG) BY MOUTH DAILY 90 tablet 1  . atorvastatin (LIPITOR) 20 MG tablet Take 1 tablet (20 mg total) by mouth daily. 90 tablet 1  . benazepril (LOTENSIN) 5 MG tablet Take 1 tablet (5 mg total) by mouth daily. 90 tablet 1  . busPIRone (BUSPAR) 15 MG tablet Take 1 tablet (15 mg total) by mouth 3 (three) times daily. 270 tablet 1  . Calcium Citrate-Vitamin D (CALCIUM + D PO) Take 1 tablet by mouth 2 (two) times daily.     . fluticasone (FLONASE) 50 MCG/ACT nasal spray Place 2 sprays into both nostrils daily. 16 g 6  . gabapentin (NEURONTIN) 300 MG capsule Take 1 capsule (300 mg total) by mouth 3 (three) times daily. 270 capsule 1  . lithium 300 MG tablet Take 1 tablet in the morning and take 2 tablets at night. 270 tablet 1  . metoprolol succinate (TOPROL-XL) 25 MG 24 hr tablet Take 1 tablet (25 mg total) by mouth daily. 90 tablet 1  . montelukast (SINGULAIR) 10 MG tablet Take 1 tablet (10 mg total) by mouth at bedtime. 90 tablet 3  . Multiple Vitamin (MULTIVITAMIN) tablet Take 1 tablet by mouth daily.    . Omega-3 Fatty Acids (FISH OIL) 1000 MG CAPS Take 1,000 mg by mouth 2 (two) times daily.     Marland Kitchen omeprazole (PRILOSEC) 40 MG capsule Take 1 capsule (40 mg total) by mouth daily. 90 capsule 3  . oxybutynin  (DITROPAN-XL) 10 MG 24 hr tablet Take 1 tablet (10 mg total) by mouth at bedtime. Will need follow-up for further refills. 90 tablet 0  . sulfamethoxazole-trimethoprim (BACTRIM DS) 800-160 MG tablet Take 1 tablet by mouth 2 (two) times daily. 6 tablet 0  . trazodone (DESYREL) 300 MG tablet Take 1 tablet (300 mg total) by mouth at bedtime. 90 tablet 1  . venlafaxine XR (EFFEXOR XR) 75 MG 24 hr capsule Take 3 capsules daily 270 capsule 1   No current facility-administered medications for this visit.     Musculoskeletal: Strength & Muscle Tone: unable to assess due to telemed visit Gait & Station: unable to assess due to telemed visit Patient leans: unable to assess due to telemed visit  Psychiatric Specialty Exam: Review of Systems  There were no vitals taken for this visit.There is no height or weight on file to calculate BMI.  General Appearance: Fairly groomed  Eye Contact:  Good  Speech:  Clear and Coherent and Normal Rate  Volume:  Normal  Mood: Euthymic  Affect: Congruent  Thought Process:  Goal Directed and Descriptions of Associations: Circumstantial  Orientation:  Full (Time, Place, and Person)  Thought Content: Logical , denied any paranoid delusions  Suicidal Thoughts:  No  Homicidal Thoughts:  No  Memory:  Recent;   Good Remote;   Good  Judgement:  Fair  Insight:  Fair  Psychomotor Activity:  Normal  Concentration:  Concentration: Good and Attention Span: Good  Recall:  Good  Fund of Knowledge: Good  Language: Good  Akathisia:  Negative  Handed:  Right  AIMS (if indicated): not done  Assets:  Communication Skills Desire for Improvement Financial Resources/Insurance Housing Social Support  ADL's:  Intact  Cognition: WNL  Sleep:  Better with Trazodone   Screenings: GAD-7     Office Visit from 04/01/2020 in [facility]  Practice Office Visit from 02/23/2019 in [facility]  Total GAD-7 Score 18 14    PHQ2-9     Office Visit from 04/01/2020 in  [facility] [place] Visit from 02/23/2019 in [facility] from 02/09/2019 in [facility] from 02/04/2018 in [place] Visit from 08/17/2017 in [facility]  PHQ-2 Total Score 6 2 5  0 0  PHQ-9 Total Score 26 12 12  -- --       Assessment and Plan: Patient reported improvement in her mood.  She informed that she has cut down the dose of lithium back to 2 tablets daily because when she accidentally starting 4 tablets she noticed increased tremors. She feels her mood is stable for now.  1. Bipolar 1 disorder, depressed, moderate (HCC)  - trazodone (DESYREL) 300 MG tablet; Take 1 tablet (300 mg total) by mouth at bedtime.  Dispense: 90 tablet; Refill: 1 - venlafaxine XR (EFFEXOR XR) 75 MG 24 hr capsule; Take 3 capsules daily  Dispense: 270 capsule; Refill: 1 - gabapentin (NEURONTIN) 300 MG capsule; Take 1 capsule (300 mg total) by mouth 3 (three) times daily.  Dispense: 270 capsule; Refill: 1 - lithium 300 MG tablet; Take 1 tablet in the morning and take 2 tablets at night.  Dispense: 270 tablet; Refill: 1  2. Anxiety  - busPIRone (BUSPAR) 15 MG tablet; Take 1 tablet (15 mg total) by mouth 3 (three) times daily.  Dispense: 270 tablet; Refill: 1 - venlafaxine XR (EFFEXOR XR) 75 MG 24 hr capsule; Take 3 capsules daily  Dispense: 270 capsule; Refill: 1 - gabapentin (NEURONTIN) 300 MG capsule; Take 1 capsule (300 mg total) by mouth 3 (three) times daily.  Dispense: 270 capsule; Refill: 1   Continue individual therapy. Follow-up in 6 weeks.   [place] Crane, MD 06/26/2020, 3:50 PM

## 2020-07-03 ENCOUNTER — Other Ambulatory Visit: Payer: Self-pay | Admitting: Neurology

## 2020-07-03 ENCOUNTER — Other Ambulatory Visit (HOSPITAL_COMMUNITY): Payer: Self-pay | Admitting: Neurology

## 2020-07-03 DIAGNOSIS — R4189 Other symptoms and signs involving cognitive functions and awareness: Secondary | ICD-10-CM

## 2020-07-17 ENCOUNTER — Other Ambulatory Visit: Payer: Self-pay

## 2020-07-17 ENCOUNTER — Ambulatory Visit (HOSPITAL_COMMUNITY)
Admission: RE | Admit: 2020-07-17 | Discharge: 2020-07-17 | Disposition: A | Discharge status: Home / Self Care | Payer: Medicare PPO | Source: Ambulatory Visit | Attending: Neurology | Admitting: Neurology

## 2020-07-17 DIAGNOSIS — R4189 Other symptoms and signs involving cognitive functions and awareness: Secondary | ICD-10-CM | POA: Diagnosis present

## 2020-07-22 ENCOUNTER — Other Ambulatory Visit: Payer: Self-pay | Admitting: Family Medicine

## 2020-07-22 DIAGNOSIS — I10 Essential (primary) hypertension: Secondary | ICD-10-CM

## 2020-08-02 ENCOUNTER — Other Ambulatory Visit: Payer: Self-pay | Admitting: Family Medicine

## 2020-08-02 DIAGNOSIS — I10 Essential (primary) hypertension: Secondary | ICD-10-CM

## 2020-08-02 NOTE — Telephone Encounter (Signed)
Requested medication (s) are due for refill today: Yes  Requested medication (s) are on the active medication list: Yes  Last refill:  04/01/20  Future visit scheduled: Yes  Notes to clinic:  Unable to refill per protocol, last refilled by another provider     Requested Prescriptions  Pending Prescriptions Disp Refills   metoprolol succinate (TOPROL-XL) 25 MG 24 hr tablet [Pharmacy Med Name: METOPROLOL SUCCINATE ER 25 MG Tablet Extended Release 24 Hour] 90 tablet 1    Sig: TAKE 1 TABLET EVERY DAY      Cardiovascular:  Beta Blockers Failed - 08/02/2020  5:33 PM      Failed - Last BP in normal range    BP Readings from Last 1 Encounters:  04/23/20 (!) 157/98          Passed - Last Heart Rate in normal range    Pulse Readings from Last 1 Encounters:  04/23/20 76          Passed - Valid encounter within last 6 months    Recent Outpatient Visits           4 months ago Essential hypertension   [facility] [place], [place]   9 months ago Overactive bladder   [facility] Merrie Roof [place], [place]   11 months ago Essential hypertension   [facility] [place], [place]   1 year ago Annual physical exam   [facility] [place], [place]   1 year ago [place] Crissman, Jeannette How, MD       Future Appointments             In 1 month Cannady, Barbaraann Faster, NP [facility], [facility]   In 8 months  [employer] [facility], [place]

## 2020-08-14 ENCOUNTER — Telehealth: Payer: Self-pay | Admitting: Nurse Practitioner

## 2020-08-14 ENCOUNTER — Other Ambulatory Visit: Payer: Self-pay | Admitting: Nurse Practitioner

## 2020-08-14 MED ORDER — OXYBUTYNIN CHLORIDE ER 10 MG PO TB24: 10.0000 mg | ORAL_TABLET | Freq: Every day | ORAL | 0 refills | Status: DC

## 2020-08-14 NOTE — Telephone Encounter (Signed)
oxybutynin (DITROPAN-XL) 10 MG 24 hr tablet 90 tablet 0 08/14/2020    Sig - Route: Take 1 tablet (10 mg total) by mouth at bedtime. Will need follow-up for further refills. - Oral   Sent to pharmacy as: oxybutynin (DITROPAN-XL) 10 MG 24 hr tablet   E-Prescribing Status: Receipt confirmed by pharmacy (08/14/2020 1:13 PM EST)   [id number] [address], [place] [address] Phone:  [phone]  Fax:  [phone]    Pt called in agitated as states that Apolonio Schneiders had told her to take 1 tab in am and another pm. The script is wrong she states. She is also stating that [facility] will not fill this script b/c of insurance. She is very upset so think she is maybe not understanding just that she needs an appt.as upset with pharmacy too, She wants a nurse call back at [phone]

## 2020-08-14 NOTE — Telephone Encounter (Signed)
Routing to provider  

## 2020-08-14 NOTE — Telephone Encounter (Signed)
Requested medication (s) are due for refill today:   Yes  Requested medication (s) are on the active medication list:   Yes  Future visit scheduled:   Yes in 1 mo. With Cannady   Last ordered: 05/27/2020 #90, 0 Refills  Returned because she was told to increase the dose to twice a day instead of once a day.  See pt's note.   Dosage clarification needed.  Requested Prescriptions  Pending Prescriptions Disp Refills   oxybutynin (DITROPAN-XL) 10 MG 24 hr tablet 90 tablet 0    Sig: Take 1 tablet (10 mg total) by mouth at bedtime. Will need follow-up for further refills.      Urology:  Bladder Agents Passed - 08/14/2020 12:01 PM      Passed - Valid encounter within last 12 months    Recent Outpatient Visits           4 months ago Essential hypertension   [facility] Merrie Roof [place], [place]   9 months ago Overactive bladder   [facility] Mer[facility] Roof [place], [place]   11 months ago Essential hypertension   [facility] [place], [place]   1 year ago Annual physical exam   [facility] [place], [place]   1 year ago [place] Crissman, Jeannette How, MD       Future Appointments             In 1 month Cannady, Barbaraann Faster, NP [facility] [facility]   In 7 months  [facility]

## 2020-08-14 NOTE — Telephone Encounter (Signed)
Spoke to patient and advised her that insurance will not pay for her prescription yet since it is too soon. She was given a 90 day supply to take 1 tab daily, patient has been taking 2 tabs daily. Patient was given an appointment with Jolene to go over changing prescription to BID instead as QD on 12/22 at 3:20 pm.

## 2020-08-14 NOTE — Telephone Encounter (Signed)
Pt has an appt with jolene on 09-23-2020 and was told to increase oxybutynin 10 mg to twice a day instead of once a day. Pt needs new directions pt takes on tablet in morning and one at bedtime. [facility] [place] on [address]

## 2020-08-15 ENCOUNTER — Telehealth (INDEPENDENT_AMBULATORY_CARE_PROVIDER_SITE_OTHER): Payer: Medicare PPO | Admitting: Psychiatry

## 2020-08-15 ENCOUNTER — Encounter (HOSPITAL_COMMUNITY): Payer: Self-pay | Admitting: Psychiatry

## 2020-08-15 ENCOUNTER — Other Ambulatory Visit: Payer: Self-pay

## 2020-08-15 DIAGNOSIS — F419 Anxiety disorder, unspecified: Secondary | ICD-10-CM | POA: Diagnosis not present

## 2020-08-15 DIAGNOSIS — F3175 Bipolar disorder, in partial remission, most recent episode depressed: Secondary | ICD-10-CM | POA: Diagnosis not present

## 2020-08-15 MED ORDER — TRAZODONE HCL 300 MG PO TABS: 300.0000 mg | ORAL_TABLET | Freq: Every day | ORAL | 1 refills | Status: DC

## 2020-08-15 MED ORDER — LITHIUM CARBONATE 300 MG PO TABS: ORAL_TABLET | ORAL | 1 refills | Status: DC

## 2020-08-15 MED ORDER — VENLAFAXINE HCL ER 75 MG PO CP24: ORAL_CAPSULE | ORAL | 1 refills | Status: DC

## 2020-08-15 MED ORDER — BUSPIRONE HCL 15 MG PO TABS: 15.0000 mg | ORAL_TABLET | Freq: Three times a day (TID) | ORAL | 1 refills | Status: DC

## 2020-08-15 MED ORDER — GABAPENTIN 300 MG PO CAPS: 300.0000 mg | ORAL_CAPSULE | Freq: Three times a day (TID) | ORAL | 1 refills | Status: DC

## 2020-08-15 NOTE — Progress Notes (Signed)
[place] MD OP Progress Note  Virtual Visit via Telephone Note  I connected with Elizabeth Malone on 08/15/20 at  1:40 PM EST by telephone and verified that I am speaking with the correct person using two identifiers.  Location: Patient: home Provider: Clinic   I discussed the limitations, risks, security and privacy concerns of performing an evaluation and management service by telephone and the availability of in person appointments. I also discussed with the patient that there may be a patient responsible charge related to this service. The patient expressed understanding and agreed to proceed.  I provided 15 minutes of non-face-to-face time during this encounter.    08/15/2020 1:58 PM [name]  MRN:  [id number]  Chief Complaint:  " I am fine but don't go on to change my appointments."  HPI: Patient reported she is doing well.  She is seem to be in cheerful spirits initially however suddenly she exclaimed that the writer should not be changing her appointments without telling her.  She claimed that she was told her appointment was on December 1 however she was surprised to see that her appointment was actually on December 2.  Writer clarified that Probation officer had no idea who and why her appointment was switched or if there was a switch. EMR was checked and asked that her appointment was scheduled for today at 1:40 PM.  Following that patient stated that she had a good Thanksgiving, she informed that she did not go anywhere.  She informed that things are going well for now.  She stated that she has been taking care of her other health conditions with the help of her medical providers.  She denies any other concerns at this time.  Writer confirmed her appointment for her next visit with her.    Visit Diagnosis:    ICD-10-CM   1. Bipolar 1 disorder, depressed, partial remission (HCC)  F31.75 venlafaxine XR (EFFEXOR XR) 75 MG 24 hr capsule    lithium 300 MG tablet    gabapentin (NEURONTIN) 300  MG capsule    trazodone (DESYREL) 300 MG tablet  2. Anxiety  F41.9 venlafaxine XR (EFFEXOR XR) 75 MG 24 hr capsule    busPIRone (BUSPAR) 15 MG tablet    gabapentin (NEURONTIN) 300 MG capsule    Past Psychiatric History: Bipolar disorder, anxiety  Past Medical History:  Past Medical History:  Diagnosis Date  . Anxiety   . Bipolar disorder ([place])   . CKD (chronic kidney disease) stage 3, GFR 30-59 ml/min (HCC)   . Complication of anesthesia   . Depression   . GERD (gastroesophageal reflux disease)    occasionally has to use tums  . Headache   . History of diverticulosis   . Hyperlipidemia   . Overactive bladder   . PONV (postoperative nausea and vomiting)   . Substance abuse ([place])    alcohol    Past Surgical History:  Procedure Laterality Date  . COLONOSCOPY WITH PROPOFOL N/A 10/07/2017   Procedure: COLONOSCOPY WITH PROPOFOL;  Surgeon: [name], MD;  Location: [facility];  Service: Endoscopy;  Laterality: N/A;  . FOOT SURGERY Bilateral   . KNEE SURGERY Left   . POLYPECTOMY  10/07/2017   Procedure: POLYPECTOMY;  Surgeon: [name], MD;  Location: [facility];  Service: Endoscopy;;  . TONSILLECTOMY AND ADENOIDECTOMY      Family Psychiatric History: see below   Family History:  Family History  Problem Relation Age of Onset  . Alzheimer's disease Mother   .  Bipolar disorder Mother   . Depression Mother   . Heart attack Father   . Diabetes Brother   . Obesity Brother   . Depression Maternal Grandfather   . Depression Daughter   . Stroke Maternal Grandmother   . Stroke Paternal Grandmother   . Heart attack Paternal Grandfather   . Breast cancer Neg Hx     Social History:  Social History   Socioeconomic History  . Marital status: Married    Spouse name: Not on file  . Number of children: 2  . Years of education: Not on file  . Highest education level: Bachelor's degree (e.g., BA, AB, BS)  Occupational History  . Occupation: retired    Tobacco Use  . Smoking status: Former Smoker    Types: Cigarettes    Start date: 07/09/1967    Quit date: 07/08/1985    Years since quitting: 35.1  . Smokeless tobacco: Never Used  Vaping Use  . Vaping Use: Never used  Substance and Sexual Activity  . Alcohol use: Yes    Alcohol/week: 3.0 standard drinks    Types: 3 Shots of liquor per week    Comment: occassional  . Drug use: No  . Sexual activity: Not Currently  Other Topics Concern  . Not on file  Social History Narrative  . Not on file   Social Determinants of Health   Financial Resource Strain: Low Risk   . Difficulty of Paying Living Expenses: Not hard at all  Food Insecurity: No Food Insecurity  . Worried About Charity fundraiser in the Last Year: Never true  . Ran Out of Food in the Last Year: Never true  Transportation Needs: No Transportation Needs  . Lack of Transportation (Medical): No  . Lack of Transportation (Non-Medical): No  Physical Activity: Inactive  . Days of Exercise per Week: 0 days  . Minutes of Exercise per Session: 0 min  Stress: Stress Concern Present  . Feeling of Stress : Very much  Social Connections:   . Frequency of Communication with Friends and Family: Not on file  . Frequency of Social Gatherings with Friends and Family: Not on file  . Attends Religious Services: Not on file  . Active Member of Clubs or Organizations: Not on file  . Attends Archivist Meetings: Not on file  . Marital Status: Not on file    Allergies:  Allergies  Allergen Reactions  . Ace Inhibitors Other (See Comments)  . Fluoxetine Other (See Comments)  . Hydrocodone-Chlorpheniramine Other (See Comments)  . Paroxetine Hcl Other (See Comments)  . Penicillin G Benzathine Other (See Comments)  . Penicillin V Potassium Other (See Comments)  . Clindamycin Hcl Rash and Other (See Comments)    Katherina Right' syndrome  . Lamotrigine Rash    Metabolic Disorder Labs: Lab Results  Component Value Date    HGBA1C 4.9 08/17/2017   No results found for: PROLACTIN Lab Results  Component Value Date   CHOL 201 (H) 04/01/2020   TRIG 283 (H) 04/01/2020   HDL 60 04/01/2020   CHOLHDL 3.2 02/23/2019   VLDL 39 (H) 08/17/2017   LDLCALC 94 04/01/2020   LDLCALC 102 (H) 08/25/2019   Lab Results  Component Value Date   TSH 3.080 02/23/2019   TSH 0.599 02/04/2018    Therapeutic Level Labs: Lab Results  Component Value Date   LITHIUM 0.72 04/22/2020   LITHIUM 0.74 11/01/2018   No results found for: VALPROATE No components found for:  CBMZ  Current Medications: Current Outpatient Medications  Medication Sig Dispense Refill  . ALLERGY RELIEF 180 MG tablet TAKE 1 TABLET(180 MG) BY MOUTH DAILY 90 tablet 1  . atorvastatin (LIPITOR) 20 MG tablet Take 1 tablet (20 mg total) by mouth daily. 90 tablet 1  . benazepril (LOTENSIN) 5 MG tablet Take 1 tablet (5 mg total) by mouth daily. 90 tablet 1  . busPIRone (BUSPAR) 15 MG tablet Take 1 tablet (15 mg total) by mouth 3 (three) times daily. 270 tablet 1  . Calcium Citrate-Vitamin D (CALCIUM + D PO) Take 1 tablet by mouth 2 (two) times daily.     . fluticasone (FLONASE) 50 MCG/ACT nasal spray SHAKE LIQUID AND USE 2 SPRAYS IN EACH NOSTRIL DAILY 16 g 2  . gabapentin (NEURONTIN) 300 MG capsule Take 1 capsule (300 mg total) by mouth 3 (three) times daily. 270 capsule 1  . lithium 300 MG tablet Take 1 tablet in the morning and take 2 tablets at night. 270 tablet 1  . metoprolol succinate (TOPROL-XL) 25 MG 24 hr tablet TAKE 1 TABLET EVERY DAY 90 tablet 1  . montelukast (SINGULAIR) 10 MG tablet Take 1 tablet (10 mg total) by mouth at bedtime. 90 tablet 3  . Multiple Vitamin (MULTIVITAMIN) tablet Take 1 tablet by mouth daily.    . Omega-3 Fatty Acids (FISH OIL) 1000 MG CAPS Take 1,000 mg by mouth 2 (two) times daily.     Marland Kitchen omeprazole (PRILOSEC) 40 MG capsule Take 1 capsule (40 mg total) by mouth daily. 90 capsule 3  . oxybutynin (DITROPAN-XL) 10 MG 24 hr tablet  Take 1 tablet (10 mg total) by mouth at bedtime. Will need follow-up for further refills. 90 tablet 0  . sulfamethoxazole-trimethoprim (BACTRIM DS) 800-160 MG tablet Take 1 tablet by mouth 2 (two) times daily. 6 tablet 0  . trazodone (DESYREL) 300 MG tablet Take 1 tablet (300 mg total) by mouth at bedtime. 90 tablet 1  . venlafaxine XR (EFFEXOR XR) 75 MG 24 hr capsule Take 3 capsules daily 270 capsule 1   No current facility-administered medications for this visit.     Musculoskeletal: Strength & Muscle Tone: unable to assess due to telemed visit Gait & Station: unable to assess due to telemed visit Patient leans: unable to assess due to telemed visit  Psychiatric Specialty Exam: Review of Systems  There were no vitals taken for this visit.There is no height or weight on file to calculate BMI.  General Appearance: Fairly groomed  Eye Contact:  Good  Speech:  Clear and Coherent and Normal Rate  Volume:  Normal  Mood: Euthymic  Affect: Congruent  Thought Process:  Goal Directed and Descriptions of Associations: Circumstantial  Orientation:  Full (Time, Place, and Person)  Thought Content: Logical , denied any paranoid delusions  Suicidal Thoughts:  No  Homicidal Thoughts:  No  Memory:  Recent;   Good Remote;   Good  Judgement:  Fair  Insight:  Fair  Psychomotor Activity:  Normal  Concentration:  Concentration: Good and Attention Span: Good  Recall:  Good  Fund of Knowledge: Good  Language: Good  Akathisia:  Negative  Handed:  Right  AIMS (if indicated): not done  Assets:  Communication Skills Desire for Improvement Financial Resources/Insurance Housing Social Support  ADL's:  Intact  Cognition: WNL  Sleep:  Better with Trazodone   Screenings: GAD-7     Office Visit from 04/01/2020 in [facility] Visit from 02/23/2019 in [facility]  Total  GAD-7 Score 18 14    PHQ2-9     Office Visit from 04/01/2020 in [facility]  Visit from 02/23/2019 in [facility] from 02/09/2019 in [facility] from 02/04/2018 in [facility] Visit from 08/17/2017 in [facility]  PHQ-2 Total Score 6 2 5  0 0  PHQ-9 Total Score 26 12 12  -- --       Assessment and Plan: Patient seems doing fairly well at present.  We will continue the same regimen for now.  1. Bipolar 1 disorder, depressed, moderate (HCC)  - trazodone (DESYREL) 300 MG tablet; Take 1 tablet (300 mg total) by mouth at bedtime.  Dispense: 90 tablet; Refill: 1 - venlafaxine XR (EFFEXOR XR) 75 MG 24 hr capsule; Take 3 capsules daily  Dispense: 270 capsule; Refill: 1 - gabapentin (NEURONTIN) 300 MG capsule; Take 1 capsule (300 mg total) by mouth 3 (three) times daily.  Dispense: 270 capsule; Refill: 1 - lithium 300 MG tablet; Take 1 tablet in the morning and take 2 tablets at night.  Dispense: 270 tablet; Refill: 1  2. Anxiety  - busPIRone (BUSPAR) 15 MG tablet; Take 1 tablet (15 mg total) by mouth 3 (three) times daily.  Dispense: 270 tablet; Refill: 1 - venlafaxine XR (EFFEXOR XR) 75 MG 24 hr capsule; Take 3 capsules daily  Dispense: 270 capsule; Refill: 1 - gabapentin (NEURONTIN) 300 MG capsule; Take 1 capsule (300 mg total) by mouth 3 (three) times daily.  Dispense: 270 capsule; Refill: 1   Continue same regimen. Continue individual therapy. Follow-up in 2 months.   [place] Crane, MD 08/15/2020, 1:58 PM

## 2020-08-26 ENCOUNTER — Other Ambulatory Visit: Payer: Self-pay | Admitting: Nurse Practitioner

## 2020-09-04 ENCOUNTER — Ambulatory Visit: Payer: Medicare PPO | Admitting: Nurse Practitioner

## 2020-09-10 ENCOUNTER — Ambulatory Visit: Payer: Medicare PPO | Admitting: Nurse Practitioner

## 2020-09-10 ENCOUNTER — Encounter: Payer: Self-pay | Admitting: Nurse Practitioner

## 2020-09-10 ENCOUNTER — Other Ambulatory Visit: Payer: Self-pay

## 2020-09-10 VITALS — BP 132/76 | HR 71 | Temp 98.2°F | Ht 64.57 in | Wt 217.0 lb

## 2020-09-10 DIAGNOSIS — M25512 Pain in left shoulder: Secondary | ICD-10-CM

## 2020-09-10 DIAGNOSIS — I10 Essential (primary) hypertension: Secondary | ICD-10-CM | POA: Diagnosis not present

## 2020-09-10 DIAGNOSIS — N3281 Overactive bladder: Secondary | ICD-10-CM

## 2020-09-10 DIAGNOSIS — G8929 Other chronic pain: Secondary | ICD-10-CM

## 2020-09-10 DIAGNOSIS — E7800 Pure hypercholesterolemia, unspecified: Secondary | ICD-10-CM

## 2020-09-10 DIAGNOSIS — F3175 Bipolar disorder, in partial remission, most recent episode depressed: Secondary | ICD-10-CM

## 2020-09-10 DIAGNOSIS — M25511 Pain in right shoulder: Secondary | ICD-10-CM

## 2020-09-10 DIAGNOSIS — L309 Dermatitis, unspecified: Secondary | ICD-10-CM

## 2020-09-10 DIAGNOSIS — Z6836 Body mass index (BMI) 36.0-36.9, adult: Secondary | ICD-10-CM

## 2020-09-10 DIAGNOSIS — E669 Obesity, unspecified: Secondary | ICD-10-CM | POA: Insufficient documentation

## 2020-09-10 MED ORDER — MONTELUKAST SODIUM 10 MG PO TABS: 10.0000 mg | ORAL_TABLET | Freq: Every day | ORAL | 4 refills | Status: DC

## 2020-09-10 MED ORDER — ATORVASTATIN CALCIUM 20 MG PO TABS: 20.0000 mg | ORAL_TABLET | Freq: Every day | ORAL | 4 refills | Status: DC

## 2020-09-10 MED ORDER — FEXOFENADINE HCL 180 MG PO TABS: ORAL_TABLET | ORAL | 4 refills | Status: DC

## 2020-09-10 MED ORDER — OMEPRAZOLE 40 MG PO CPDR: 40.0000 mg | DELAYED_RELEASE_CAPSULE | Freq: Every day | ORAL | 4 refills | Status: DC

## 2020-09-10 MED ORDER — METOPROLOL SUCCINATE ER 25 MG PO TB24: 25.0000 mg | ORAL_TABLET | Freq: Every day | ORAL | 4 refills | Status: DC

## 2020-09-10 MED ORDER — OXYBUTYNIN CHLORIDE ER 10 MG PO TB24: 20.0000 mg | ORAL_TABLET | Freq: Every day | ORAL | 4 refills | Status: DC

## 2020-09-10 MED ORDER — TRIAMCINOLONE ACETONIDE 0.1 % EX CREA: 1.0000 "application " | TOPICAL_CREAM | Freq: Two times a day (BID) | CUTANEOUS | 0 refills | Status: DC

## 2020-09-10 MED ORDER — BENAZEPRIL HCL 5 MG PO TABS: 5.0000 mg | ORAL_TABLET | Freq: Every day | ORAL | 4 refills | Status: DC

## 2020-09-10 NOTE — Assessment & Plan Note (Signed)
BMI 36.60 with HTN and HLD.  Recommended eating smaller high protein, low fat meals more frequently and exercising 30 mins a day 5 times a week with a goal of 10-15lb weight loss in the next 3 months. Patient voiced their understanding and motivation to adhere to these recommendations.

## 2020-09-10 NOTE — Assessment & Plan Note (Signed)
Suspect arthritic changes.  At this time recommend continue OTC medications as needed -- Tylenol for arthritis pain + OTC creams like Voltaren or Icy/Hot.  Alternate heat and ice as needed.  Ensure gentle stretching daily.  For worsening or ongoing pain could consider imaging and possible PT referral.

## 2020-09-10 NOTE — Assessment & Plan Note (Signed)
Chronic, ongoing, monitored by psychiatry.  Continue this collaboration and current medication regimen as prescribed by them.  Reviewed recent notes.

## 2020-09-10 NOTE — Patient Instructions (Signed)
DASH Eating Plan DASH stands for "Dietary Approaches to Stop Hypertension." The DASH eating plan is a healthy eating plan that has been shown to reduce high blood pressure (hypertension). It may also reduce your risk for type 2 diabetes, heart disease, and stroke. The DASH eating plan may also help with weight loss. What are tips for following this plan?  General guidelines  Avoid eating more than 2,300 mg (milligrams) of salt (sodium) a day. If you have hypertension, you may need to reduce your sodium intake to 1,500 mg a day.  Limit alcohol intake to no more than 1 drink a day for nonpregnant women and 2 drinks a day for men. One drink equals 12 oz of beer, 5 oz of wine, or 1 oz of hard liquor.  Work with your health care provider to maintain a healthy body weight or to lose weight. Ask what an ideal weight is for you.  Get at least 30 minutes of exercise that causes your heart to beat faster (aerobic exercise) most days of the week. Activities may include walking, swimming, or biking.  Work with your health care provider or diet and nutrition specialist (dietitian) to adjust your eating plan to your individual calorie needs. Reading food labels   Check food labels for the amount of sodium per serving. Choose foods with less than 5 percent of the Daily Value of sodium. Generally, foods with less than 300 mg of sodium per serving fit into this eating plan.  To find whole grains, look for the word "whole" as the first word in the ingredient list. Shopping  Buy products labeled as "low-sodium" or "no salt added."  Buy fresh foods. Avoid canned foods and premade or frozen meals. Cooking  Avoid adding salt when cooking. Use salt-free seasonings or herbs instead of table salt or sea salt. Check with your health care provider or pharmacist before using salt substitutes.  Do not fry foods. Cook foods using healthy methods such as baking, boiling, grilling, and broiling instead.  Cook with  heart-healthy oils, such as olive, canola, soybean, or sunflower oil. Meal planning  Eat a balanced diet that includes: ? 5 or more servings of fruits and vegetables each day. At each meal, try to fill half of your plate with fruits and vegetables. ? Up to 6-8 servings of whole grains each day. ? Less than 6 oz of lean meat, poultry, or fish each day. A 3-oz serving of meat is about the same size as a deck of cards. One egg equals 1 oz. ? 2 servings of low-fat dairy each day. ? A serving of nuts, seeds, or beans 5 times each week. ? Heart-healthy fats. Healthy fats called Omega-3 fatty acids are found in foods such as flaxseeds and coldwater fish, like sardines, salmon, and mackerel.  Limit how much you eat of the following: ? Canned or prepackaged foods. ? Food that is high in trans fat, such as fried foods. ? Food that is high in saturated fat, such as fatty meat. ? Sweets, desserts, sugary drinks, and other foods with added sugar. ? Full-fat dairy products.  Do not salt foods before eating.  Try to eat at least 2 vegetarian meals each week.  Eat more home-cooked food and less restaurant, buffet, and fast food.  When eating at a restaurant, ask that your food be prepared with less salt or no salt, if possible. What foods are recommended? The items listed may not be a complete list. Talk with your dietitian about   what dietary choices are best for you. Grains Whole-grain or whole-wheat bread. Whole-grain or whole-wheat pasta. Brown rice. Oatmeal. Quinoa. Bulgur. Whole-grain and low-sodium cereals. Pita bread. Low-fat, low-sodium crackers. Whole-wheat flour tortillas. Vegetables Fresh or frozen vegetables (raw, steamed, roasted, or grilled). Low-sodium or reduced-sodium tomato and vegetable juice. Low-sodium or reduced-sodium tomato sauce and tomato paste. Low-sodium or reduced-sodium canned vegetables. Fruits All fresh, dried, or frozen fruit. Canned fruit in natural juice (without  added sugar). Meat and other protein foods Skinless chicken or turkey. Ground chicken or turkey. Pork with fat trimmed off. Fish and seafood. Egg whites. Dried beans, peas, or lentils. Unsalted nuts, nut butters, and seeds. Unsalted canned beans. Lean cuts of beef with fat trimmed off. Low-sodium, lean deli meat. Dairy Low-fat (1%) or fat-free (skim) milk. Fat-free, low-fat, or reduced-fat cheeses. Nonfat, low-sodium ricotta or cottage cheese. Low-fat or nonfat yogurt. Low-fat, low-sodium cheese. Fats and oils Soft margarine without trans fats. Vegetable oil. Low-fat, reduced-fat, or light mayonnaise and salad dressings (reduced-sodium). Canola, safflower, olive, soybean, and sunflower oils. Avocado. Seasoning and other foods Herbs. Spices. Seasoning mixes without salt. Unsalted popcorn and pretzels. Fat-free sweets. What foods are not recommended? The items listed may not be a complete list. Talk with your dietitian about what dietary choices are best for you. Grains Baked goods made with fat, such as croissants, muffins, or some breads. Dry pasta or rice meal packs. Vegetables Creamed or fried vegetables. Vegetables in a cheese sauce. Regular canned vegetables (not low-sodium or reduced-sodium). Regular canned tomato sauce and paste (not low-sodium or reduced-sodium). Regular tomato and vegetable juice (not low-sodium or reduced-sodium). Pickles. Olives. Fruits Canned fruit in a light or heavy syrup. Fried fruit. Fruit in cream or butter sauce. Meat and other protein foods Fatty cuts of meat. Ribs. Fried meat. Bacon. Sausage. Bologna and other processed lunch meats. Salami. Fatback. Hotdogs. Bratwurst. Salted nuts and seeds. Canned beans with added salt. Canned or smoked fish. Whole eggs or egg yolks. Chicken or turkey with skin. Dairy Whole or 2% milk, cream, and half-and-half. Whole or full-fat cream cheese. Whole-fat or sweetened yogurt. Full-fat cheese. Nondairy creamers. Whipped toppings.  Processed cheese and cheese spreads. Fats and oils Butter. Stick margarine. Lard. Shortening. Ghee. Bacon fat. Tropical oils, such as coconut, palm kernel, or palm oil. Seasoning and other foods Salted popcorn and pretzels. Onion salt, garlic salt, seasoned salt, table salt, and sea salt. Worcestershire sauce. Tartar sauce. Barbecue sauce. Teriyaki sauce. Soy sauce, including reduced-sodium. Steak sauce. Canned and packaged gravies. Fish sauce. Oyster sauce. Cocktail sauce. Horseradish that you find on the shelf. Ketchup. Mustard. Meat flavorings and tenderizers. Bouillon cubes. Hot sauce and Tabasco sauce. Premade or packaged marinades. Premade or packaged taco seasonings. Relishes. Regular salad dressings. Where to find more information:  National Heart, Lung, and Blood Institute: www.nhlbi.nih.gov  American Heart Association: www.heart.org Summary  The DASH eating plan is a healthy eating plan that has been shown to reduce high blood pressure (hypertension). It may also reduce your risk for type 2 diabetes, heart disease, and stroke.  With the DASH eating plan, you should limit salt (sodium) intake to 2,300 mg a day. If you have hypertension, you may need to reduce your sodium intake to 1,500 mg a day.  When on the DASH eating plan, aim to eat more fresh fruits and vegetables, whole grains, lean proteins, low-fat dairy, and heart-healthy fats.  Work with your health care provider or diet and nutrition specialist (dietitian) to adjust your eating plan to your   individual calorie needs. This information is not intended to replace advice given to you by your health care provider. Make sure you discuss any questions you have with your health care provider. Document Revised: 08/13/2017 Document Reviewed: 08/24/2016 Elsevier Patient Education  2020 Elsevier Inc.  

## 2020-09-10 NOTE — Assessment & Plan Note (Signed)
Chronic, ongoing.  Continue current medication regimen and adjust as needed.  Plan on lipid panel next visit. °

## 2020-09-10 NOTE — Assessment & Plan Note (Signed)
To neck area, acute.  Script for Triamcinolone cream sent.  Recommend gentle skin cleanser at home and may take a daily Claritin or Allegra to see if benefit.  If worsening or ongoing then return to office.

## 2020-09-10 NOTE — Assessment & Plan Note (Signed)
Chronic, ongoing.  Well-controlled with Ditropan 20 MG, refills sent.  Educated patient on medication and risks of this with patients over 63.  She would like to continue regimen at this time as is only one that has offered benefit.  May benefit from change to [facility] in future.  Return in 6 months to meet new PCP.

## 2020-09-10 NOTE — Assessment & Plan Note (Signed)
Chronic, ongoing with BP at goal today.  Recommend she monitor BP at least a few mornings a week at home and document.  DASH diet at home.  Continue current medication regimen and adjust as needed.  Labs today: BMP to recheck GFR.  Return in 6 months to meet new PCP.

## 2020-09-10 NOTE — Progress Notes (Signed)
BP 132/76   Pulse 71   Temp 98.2 F (36.8 C) (Oral)   Ht 5' 4.57" (1.64 m)   Wt 217 lb (98.4 kg)   SpO2 96%   BMI 36.60 kg/m    Subjective:    Patient ID: Elizabeth Malone, female    DOB: May 27, 1951, 69 y.o.   MRN: [id number]  HPI: Elizabeth Malone is a 69 y.o. female  Chief Complaint  Patient presents with  . Medication Refill   OVERACTIVE BLADDER She continues on Ditropan XL 10 MG daily at bedtime.  This is the only thing that works -- was told to take two a day during summer.  She was told years ago she should get bladder fixed, but never did. Dysuria: no Urinary frequency: no Urgency: no Small volume voids: no Symptom severity: no Urinary incontinence: no Foul odor: no Hematuria: no Abdominal pain: no Back pain: no Suprapubic pain/pressure: no Flank pain: no Treatments attempted: Ditropan   HYPERTENSION / HYPERLIPIDEMIA Continues on Metoprolol and Atorvastatin daily. Satisfied with current treatment? yes Duration of hypertension: chronic BP monitoring frequency: not checking BP range:  BP medication side effects: no Duration of hyperlipidemia: chronic Cholesterol medication side effects: no Cholesterol supplements: none Medication compliance: good compliance Aspirin: no Recent stressors: no Recurrent headaches: no Visual changes: no Palpitations: no Dyspnea: no Chest pain: no Lower extremity edema: no Dizzy/lightheaded: no   RASH Started about one week ago to around neck Duration:  days  Location: neck  Itching: occasional Burning: no Redness: yes Oozing: no Scaling: no Blisters: no Painful: no Fevers: no Change in detergents/soaps/personal care products: no Recent illness: no Recent travel:no History of same: no Context: stable Alleviating factors: nothing Treatments attempted:nothing Shortness of breath: no  Throat/tongue swelling: no Myalgias/arthralgias: no   SHOULDER PAIN Reports this as an ongoing, chronic issue bilaterally to  shoulders and collar bone with occasional flares dependent on weather. Duration: chronic Involved shoulder: bilateral Mechanism of injury: unknown Location: diffuse Onset:gradual Severity: mild  Quality:  dull, aching and throbbing Frequency: intermittent Radiation: no Aggravating factors: movement and sleep  Alleviating factors: heat, APAP, NSAIDs and rest  Status: stable Treatments attempted: heat, APAP and ibuprofen  Relief with NSAIDs?:  moderate Weakness: no Numbness: no Decreased grip strength: no Redness: no Swelling: no Bruising: no Fevers: no  BIPOLAR DISORDER Followed by psychiatry with last visit 08/15/20 with Dr. Toy Care.  She had recent visit with neurology on 07/03/20 for mild cognitive impairment -- had MRI 07/05/20 of brain with normal findings.  Labs all unremarkable with neurology -- with exception of mild reduction in GFR at 55. Mood status: stable Satisfied with current treatment?: yes Symptom severity: moderate  Duration of current treatment : chronic Side effects: no Medication compliance: good compliance Psychotherapy/counseling: yes in the past Previous psychiatric medications: multiple meds Depressed mood: no Anxious mood: no Anhedonia: no Significant weight loss or gain: no Insomnia: none Fatigue: no Feelings of worthlessness or guilt: no Impaired concentration/indecisiveness: no Suicidal ideations: no Hopelessness: no Crying spells: no Depression screen [facility] 2/9 09/10/2020 04/01/2020 02/23/2019 02/09/2019 02/09/2019  Decreased Interest 0 3 1 2  0  Down, Depressed, Hopeless 3 3 1 3  0  PHQ - 2 Score 3 6 2 5  0  Altered sleeping 1 3 0 3 -  Tired, decreased energy 3 3 2 3  -  Change in appetite 3 3 2  0 -  Feeling bad or failure about yourself  0 3 2 0 -  Trouble concentrating 1 2 2  1 -  Moving slowly or fidgety/restless 0 3 2 0 -  Suicidal thoughts 0 3 0 0 -  PHQ-9 Score 11 26 12 12  -  Difficult doing work/chores - - Very difficult Somewhat difficult  -    Relevant past medical, surgical, family and social history reviewed and updated as indicated. Interim medical history since our last visit reviewed. Allergies and medications reviewed and updated.  Review of Systems  Constitutional: Negative for activity change, appetite change, diaphoresis, fatigue and fever.  Respiratory: Negative for cough, chest tightness and shortness of breath.   Cardiovascular: Negative for chest pain, palpitations and leg swelling.  Gastrointestinal: Negative.   Musculoskeletal: Positive for arthralgias.  Skin: Positive for rash.  Neurological: Negative.   Psychiatric/Behavioral: Negative.     Per HPI unless specifically indicated above     Objective:    BP 132/76   Pulse 71   Temp 98.2 F (36.8 C) (Oral)   Ht 5' 4.57" (1.64 m)   Wt 217 lb (98.4 kg)   SpO2 96%   BMI 36.60 kg/m   Wt Readings from Last 3 Encounters:  09/10/20 217 lb (98.4 kg)  04/22/20 210 lb (95.3 kg)  04/01/20 210 lb (95.3 kg)    Physical Exam Vitals and nursing note reviewed.  Constitutional:      General: She is awake. She is not in acute distress.    Appearance: She is well-developed and well-groomed. She is obese. She is not ill-appearing or toxic-appearing.  HENT:     Head: Normocephalic.     Right Ear: Hearing normal.     Left Ear: Hearing normal.  Eyes:     General: Lids are normal.        Right eye: No discharge.        Left eye: No discharge.     Conjunctiva/sclera: Conjunctivae normal.     Pupils: Pupils are equal, round, and reactive to light.  Neck:     Thyroid: No thyromegaly.     Vascular: No carotid bruit.  Cardiovascular:     Rate and Rhythm: Normal rate and regular rhythm.     Heart sounds: Normal heart sounds. No murmur heard. No gallop.   Pulmonary:     Effort: Pulmonary effort is normal. No accessory muscle usage or respiratory distress.     Breath sounds: Normal breath sounds.  Abdominal:     General: Bowel sounds are normal.      Palpations: Abdomen is soft. There is no hepatomegaly or splenomegaly.  Musculoskeletal:     Right shoulder: No swelling, laceration, tenderness, bony tenderness or crepitus. Normal range of motion. Normal strength. Normal pulse.     Left shoulder: No swelling, laceration, tenderness, bony tenderness or crepitus. Normal range of motion. Normal strength. Normal pulse.     Cervical back: Normal range of motion and neck supple.     Right lower leg: No edema.     Left lower leg: No edema.  Lymphadenopathy:     Cervical: No cervical adenopathy.  Skin:    General: Skin is warm and dry.     Findings: Rash present. Rash is macular.     Comments: Macular rash noted around posterior neck bilaterally and extending around to anterior neck.  No vesicles or open sores.  Skin intact.  No scaling.  Mild erythema.  Neurological:     Mental Status: She is alert and oriented to person, place, and time.     Deep Tendon Reflexes: Reflexes are normal and  symmetric.     Reflex Scores:      Brachioradialis reflexes are 2+ on the right side.      Patellar reflexes are 2+ on the right side and 2+ on the left side. Psychiatric:        Attention and Perception: Attention normal.        Mood and Affect: Mood normal.        Speech: Speech normal.        Behavior: Behavior normal. Behavior is cooperative.        Thought Content: Thought content normal.     Results for orders placed or performed during the hospital encounter of 04/22/20  Comprehensive metabolic panel  Result Value Ref Range   Sodium 138 135 - 145 mmol/L   Potassium 4.5 3.5 - 5.1 mmol/L   Chloride 104 98 - 111 mmol/L   CO2 28 22 - 32 mmol/L   Glucose, Bld 116 (H) 70 - 99 mg/dL   BUN 14 8 - 23 mg/dL   Creatinine, Ser 0.94 0.44 - 1.00 mg/dL   Calcium 9.7 8.9 - 10.3 mg/dL   Total Protein 7.5 6.5 - 8.1 g/dL   Albumin 4.4 3.5 - 5.0 g/dL   AST 27 15 - 41 U/L   ALT 44 0 - 44 U/L   Alkaline Phosphatase 93 38 - 126 U/L   Total Bilirubin 0.6 0.3 -  1.2 mg/dL   GFR calc non Af Amer >60 >60 mL/min   GFR calc Af Amer >60 >60 mL/min   Anion gap 6 5 - 15  Ethanol  Result Value Ref Range   Alcohol, Ethyl (B) Q000111Q Q000111Q mg/dL  Salicylate level  Result Value Ref Range   Salicylate Lvl Q000111Q (L) 7.0 - 30.0 mg/dL  Acetaminophen level  Result Value Ref Range   Acetaminophen (Tylenol), Serum <10 (L) 10 - 30 ug/mL  cbc  Result Value Ref Range   WBC 8.9 4.0 - 10.5 K/uL   RBC 4.32 3.87 - 5.11 MIL/uL   Hemoglobin 14.1 12.0 - 15.0 g/dL   HCT 41.2 36.0 - 46.0 %   MCV 95.4 80.0 - 100.0 fL   MCH 32.6 26.0 - 34.0 pg   MCHC 34.2 30.0 - 36.0 g/dL   RDW 12.4 11.5 - 15.5 %   Platelets 239 150 - 400 K/uL   nRBC 0.0 0.0 - 0.2 %  Urine Drug Screen, Qualitative  Result Value Ref Range   Tricyclic, Ur Screen NONE DETECTED NONE DETECTED   Amphetamines, Ur Screen NONE DETECTED NONE DETECTED   MDMA (Ecstasy)Ur Screen NONE DETECTED NONE DETECTED   Cocaine Metabolite,Ur [place] NONE DETECTED NONE DETECTED   Opiate, Ur Screen NONE DETECTED NONE DETECTED   Phencyclidine (PCP) Ur S NONE DETECTED NONE DETECTED   Cannabinoid 50 Ng, Ur  NONE DETECTED NONE DETECTED   Barbiturates, Ur Screen NONE DETECTED NONE DETECTED   Benzodiazepine, Ur Scrn NONE DETECTED NONE DETECTED   Methadone Scn, Ur NONE DETECTED NONE DETECTED  Urinalysis, Routine w reflex microscopic  Result Value Ref Range   Color, Urine YELLOW (A) YELLOW   APPearance HAZY (A) CLEAR   Specific Gravity, Urine 1.017 1.005 - 1.030   pH 5.0 5.0 - 8.0   Glucose, UA NEGATIVE NEGATIVE mg/dL   Hgb urine dipstick NEGATIVE NEGATIVE   Bilirubin Urine NEGATIVE NEGATIVE   Ketones, ur NEGATIVE NEGATIVE mg/dL   Protein, ur NEGATIVE NEGATIVE mg/dL   Nitrite NEGATIVE NEGATIVE   Leukocytes,Ua SMALL (A) NEGATIVE   RBC /  HPF 0-5 0 - 5 RBC/hpf   WBC, UA 0-5 0 - 5 WBC/hpf   Bacteria, UA NONE SEEN NONE SEEN   Squamous Epithelial / LPF 0-5 0 - 5   Mucus PRESENT    Hyaline Casts, UA PRESENT    Ca Oxalate Crys, UA  PRESENT   Lithium level  Result Value Ref Range   Lithium Lvl 0.72 0.60 - 1.20 mmol/L      Assessment & Plan:   Problem List Items Addressed This Visit      Cardiovascular and Mediastinum   Essential hypertension    Chronic, ongoing with BP at goal today.  Recommend she monitor BP at least a few mornings a week at home and document.  DASH diet at home.  Continue current medication regimen and adjust as needed.  Labs today: BMP to recheck GFR.  Return in 6 months to meet new PCP.       Relevant Medications   metoprolol succinate (TOPROL-XL) 25 MG 24 hr tablet   benazepril (LOTENSIN) 5 MG tablet   atorvastatin (LIPITOR) 20 MG tablet   Other Relevant Orders   Basic metabolic panel     Musculoskeletal and Integument   Dermatitis    To neck area, acute.  Script for Triamcinolone cream sent.  Recommend gentle skin cleanser at home and may take a daily Claritin or Allegra to see if benefit.  If worsening or ongoing then return to office.        Genitourinary   Overactive bladder    Chronic, ongoing.  Well-controlled with Ditropan 20 MG, refills sent.  Educated patient on medication and risks of this with patients over 26.  She would like to continue regimen at this time as is only one that has offered benefit.  May benefit from change to [facility] in future.  Return in 6 months to meet new PCP.        Other   Hypercholesteremia    Chronic, ongoing.  Continue current medication regimen and adjust as needed.  Plan on lipid panel next visit.         Relevant Medications   metoprolol succinate (TOPROL-XL) 25 MG 24 hr tablet   benazepril (LOTENSIN) 5 MG tablet   atorvastatin (LIPITOR) 20 MG tablet   Bipolar 1 disorder, depressed, partial remission (HCC) - Primary    Chronic, ongoing, monitored by psychiatry.  Continue this collaboration and current medication regimen as prescribed by them.  Reviewed recent notes.      Obesity    BMI 36.60 with HTN and HLD.  Recommended eating  smaller high protein, low fat meals more frequently and exercising 30 mins a day 5 times a week with a goal of 10-15lb weight loss in the next 3 months. Patient voiced their understanding and motivation to adhere to these recommendations.       Chronic pain of both shoulders    Suspect arthritic changes.  At this time recommend continue OTC medications as needed -- Tylenol for arthritis pain + OTC creams like Voltaren or Icy/Hot.  Alternate heat and ice as needed.  Ensure gentle stretching daily.  For worsening or ongoing pain could consider imaging and possible PT referral.          Follow up plan: Return in about 6 months (around 03/11/2021) for HTN/HLD, MOOD, OVERACTIVE BLADDER -- with new PCP.

## 2020-09-11 LAB — BASIC METABOLIC PANEL
BUN/Creatinine Ratio: 16 (ref 12–28)
BUN: 13 mg/dL (ref 8–27)
CO2: 24 mmol/L (ref 20–29)
Calcium: 9.8 mg/dL (ref 8.7–10.3)
Chloride: 101 mmol/L (ref 96–106)
Creatinine, Ser: 0.83 mg/dL (ref 0.57–1.00)
GFR calc Af Amer: 83 mL/min/{1.73_m2} (ref >59)
GFR calc non Af Amer: 72 mL/min/{1.73_m2} (ref >59)
Glucose: 97 mg/dL (ref 65–99)
Potassium: 5 mmol/L (ref 3.5–5.2)
Sodium: 138 mmol/L (ref 134–144)

## 2020-09-11 NOTE — Progress Notes (Signed)
Contacted via MyChart   Good afternoon Elizabeth Malone, your labs have returned and kidney function remains in stable range, along with electrolytes.  Have a wonderful day!! Keep being awesome!!  Thank you for allowing me to participate in your care. Kindest regards, Jerrik Housholder

## 2020-09-23 ENCOUNTER — Ambulatory Visit: Payer: Medicare PPO | Admitting: Nurse Practitioner

## 2020-09-26 ENCOUNTER — Telehealth (HOSPITAL_COMMUNITY): Payer: Self-pay | Admitting: *Deleted

## 2020-09-26 NOTE — Telephone Encounter (Signed)
PA obtained for Lithium Carbonate 300 mg good until 09/12/21 PA #[id number].

## 2020-10-09 ENCOUNTER — Telehealth (INDEPENDENT_AMBULATORY_CARE_PROVIDER_SITE_OTHER): Payer: Medicare PPO | Admitting: Psychiatry

## 2020-10-09 ENCOUNTER — Encounter (HOSPITAL_COMMUNITY): Payer: Self-pay | Admitting: Psychiatry

## 2020-10-09 ENCOUNTER — Other Ambulatory Visit: Payer: Self-pay

## 2020-10-09 DIAGNOSIS — F3175 Bipolar disorder, in partial remission, most recent episode depressed: Secondary | ICD-10-CM

## 2020-10-09 DIAGNOSIS — F419 Anxiety disorder, unspecified: Secondary | ICD-10-CM | POA: Diagnosis not present

## 2020-10-09 NOTE — Progress Notes (Signed)
[place] MD OP Progress Note  Virtual Visit via Telephone Note  I connected with Elizabeth Malone on 10/09/20 at  1:30 PM EST by telephone and verified that I am speaking with the correct person using two identifiers.  Location: Patient: home Provider: Clinic   I discussed the limitations, risks, security and privacy concerns of performing an evaluation and management service by telephone and the availability of in person appointments. I also discussed with the patient that there may be a patient responsible charge related to this service. The patient expressed understanding and agreed to proceed.  I provided 16 minutes of non-face-to-face time during this encounter.    10/09/2020 2:52 PM Elizabeth Malone  MRN:  [id number]  Chief Complaint:  " I am really depressed."  HPI: Patient reported that she has been feeling very depressed for the past few weeks.  She stated that the family did not go anywhere for Christmas holidays.  She stated that her mother contracted Covid in [place] and is currently on hospice care.  She is 70 years old and also has dementia. Mother is also stressed because her husband is being worked up for some cardiological issues, he has investigative procedure coming up in the near future.  Patient expressed her frustration with her husband's behaviors as he has very difficult to manage sometimes.  She stated that he is rude to the nursing staff whenever he goes in for any medical appointment or procedure.  She stated that she just saw her therapist yesterday and her therapist also suggested that maybe she should go up on her dose of lithium as that helped her in the past.  Patient stated that she would like to go up on her lithium dose she is currently taking 1 tablet in the morning and 1 in the evening and asked if she can take 2 tablets in the evening instead of 1.  Writer recommended going up on the dose of lithium for optimal control of depressive symptoms.  She recently had her  BMP done and the results were within normal limits.    Visit Diagnosis:    ICD-10-CM   1. Bipolar 1 disorder, depressed, partial remission ([place])  F31.75   2. Anxiety  F41.9     Past Psychiatric History: Bipolar disorder, anxiety  Past Medical History:  Past Medical History:  Diagnosis Date  . Anxiety   . Bipolar disorder ([place])   . CKD (chronic kidney disease) stage 3, GFR 30-59 ml/min (HCC)   . Complication of anesthesia   . Depression   . GERD (gastroesophageal reflux disease)    occasionally has to use tums  . Headache   . History of diverticulosis   . Hyperlipidemia   . Overactive bladder   . PONV (postoperative nausea and vomiting)   . Substance abuse ([place])    alcohol    Past Surgical History:  Procedure Laterality Date  . COLONOSCOPY WITH PROPOFOL N/A 10/07/2017   Procedure: COLONOSCOPY WITH PROPOFOL;  Surgeon: [name], MD;  Location: [facility];  Service: Endoscopy;  Laterality: N/A;  . FOOT SURGERY Bilateral   . KNEE SURGERY Left   . POLYPECTOMY  10/07/2017   Procedure: POLYPECTOMY;  Surgeon: [name], MD;  Location: [facility];  Service: Endoscopy;;  . TONSILLECTOMY AND ADENOIDECTOMY      Family Psychiatric History: see below   Family History:  Family History  Problem Relation Age of Onset  . Alzheimer's disease Mother   . Bipolar disorder Mother   .  Depression Mother   . Heart attack Father   . Diabetes Brother   . Obesity Brother   . Depression Maternal Grandfather   . Depression Daughter   . Stroke Maternal Grandmother   . Stroke Paternal Grandmother   . Heart attack Paternal Grandfather   . Breast cancer Neg Hx     Social History:  Social History   Socioeconomic History  . Marital status: Married    Spouse name: Not on file  . Number of children: 2  . Years of education: Not on file  . Highest education level: Bachelor's degree (e.g., BA, AB, BS)  Occupational History  . Occupation: retired   Tobacco Use  .  Smoking status: Former Smoker    Types: Cigarettes    Start date: 07/09/1967    Quit date: 07/08/1985    Years since quitting: 35.2  . Smokeless tobacco: Never Used  Vaping Use  . Vaping Use: Never used  Substance and Sexual Activity  . Alcohol use: Yes    Alcohol/week: 3.0 standard drinks    Types: 3 Shots of liquor per week    Comment: occassional  . Drug use: No  . Sexual activity: Not Currently  Other Topics Concern  . Not on file  Social History Narrative  . Not on file   Social Determinants of Health   Financial Resource Strain: Low Risk   . Difficulty of Paying Living Expenses: Not hard at all  Food Insecurity: No Food Insecurity  . Worried About Charity fundraiser in the Last Year: Never true  . Ran Out of Food in the Last Year: Never true  Transportation Needs: No Transportation Needs  . Lack of Transportation (Medical): No  . Lack of Transportation (Non-Medical): No  Physical Activity: Inactive  . Days of Exercise per Week: 0 days  . Minutes of Exercise per Session: 0 min  Stress: Stress Concern Present  . Feeling of Stress : Very much  Social Connections: Not on file    Allergies:  Allergies  Allergen Reactions  . Ace Inhibitors Other (See Comments)  . Fluoxetine Other (See Comments)  . Hydrocodone-Chlorpheniramine Other (See Comments)  . Paroxetine Hcl Other (See Comments)  . Penicillin G Benzathine Other (See Comments)  . Penicillin V Potassium Other (See Comments)  . Clindamycin Hcl Rash and Other (See Comments)    Katherina Right' syndrome  . Lamotrigine Rash    Metabolic Disorder Labs: Lab Results  Component Value Date   HGBA1C 4.9 08/17/2017   No results found for: PROLACTIN Lab Results  Component Value Date   CHOL 201 (H) 04/01/2020   TRIG 283 (H) 04/01/2020   HDL 60 04/01/2020   CHOLHDL 3.2 02/23/2019   VLDL 39 (H) 08/17/2017   LDLCALC 94 04/01/2020   LDLCALC 102 (H) 08/25/2019   Lab Results  Component Value Date   TSH 3.080  02/23/2019   TSH 0.599 02/04/2018    Therapeutic Level Labs: Lab Results  Component Value Date   LITHIUM 0.72 04/22/2020   LITHIUM 0.74 11/01/2018   No results found for: VALPROATE No components found for:  CBMZ  Current Medications: Current Outpatient Medications  Medication Sig Dispense Refill  . atorvastatin (LIPITOR) 20 MG tablet Take 1 tablet (20 mg total) by mouth daily. 90 tablet 4  . benazepril (LOTENSIN) 5 MG tablet Take 1 tablet (5 mg total) by mouth daily. 90 tablet 4  . busPIRone (BUSPAR) 15 MG tablet Take 1 tablet (15 mg total) by mouth 3 (  three) times daily. 270 tablet 1  . Calcium Citrate-Vitamin D (CALCIUM + D PO) Take 1 tablet by mouth 2 (two) times daily.     . fexofenadine (ALLERGY RELIEF) 180 MG tablet TAKE 1 TABLET(180 MG) BY MOUTH DAILY 90 tablet 4  . fluticasone (FLONASE) 50 MCG/ACT nasal spray SHAKE LIQUID AND USE 2 SPRAYS IN EACH NOSTRIL DAILY 16 g 2  . gabapentin (NEURONTIN) 300 MG capsule Take 1 capsule (300 mg total) by mouth 3 (three) times daily. 270 capsule 1  . lithium 300 MG tablet Take 1 tablet in the morning and take 2 tablets at night. 270 tablet 1  . metoprolol succinate (TOPROL-XL) 25 MG 24 hr tablet Take 1 tablet (25 mg total) by mouth daily. 90 tablet 4  . montelukast (SINGULAIR) 10 MG tablet Take 1 tablet (10 mg total) by mouth at bedtime. 90 tablet 4  . Multiple Vitamin (MULTIVITAMIN) tablet Take 1 tablet by mouth daily.    . Omega-3 Fatty Acids (FISH OIL) 1000 MG CAPS Take 1,000 mg by mouth 2 (two) times daily.     Marland Kitchen omeprazole (PRILOSEC) 40 MG capsule Take 1 capsule (40 mg total) by mouth daily. 90 capsule 4  . oxybutynin (DITROPAN-XL) 10 MG 24 hr tablet Take 2 tablets (20 mg total) by mouth at bedtime. 180 tablet 4  . trazodone (DESYREL) 300 MG tablet Take 1 tablet (300 mg total) by mouth at bedtime. 90 tablet 1  . triamcinolone (KENALOG) 0.1 % Apply 1 application topically 2 (two) times daily. 45 each 0  . trospium (SANCTURA) 20 MG tablet      . venlafaxine XR (EFFEXOR XR) 75 MG 24 hr capsule Take 3 capsules daily 270 capsule 1   No current facility-administered medications for this visit.     Musculoskeletal: Strength & Muscle Tone: unable to assess due to telemed visit Gait & Station: unable to assess due to telemed visit Patient leans: unable to assess due to telemed visit  Psychiatric Specialty Exam: Review of Systems  There were no vitals taken for this visit.There is no height or weight on file to calculate BMI.  General Appearance: Fairly groomed  Eye Contact:  Good  Speech:  Clear and Coherent and Normal Rate  Volume:  Normal  Mood: Depressed, tearful  Affect: Congruent  Thought Process:  Goal Directed and Descriptions of Associations: Circumstantial  Orientation:  Full (Time, Place, and Person)  Thought Content: Logical , denied any paranoid delusions  Suicidal Thoughts:  No  Homicidal Thoughts:  No  Memory:  Recent;   Good Remote;   Good  Judgement:  Fair  Insight:  Fair  Psychomotor Activity:  Normal  Concentration:  Concentration: Good and Attention Span: Good  Recall:  Good  Fund of Knowledge: Good  Language: Good  Akathisia:  Negative  Handed:  Right  AIMS (if indicated): not done  Assets:  Communication Skills Desire for Improvement Financial Resources/Insurance Housing Social Support  ADL's:  Intact  Cognition: WNL  Sleep:  Better with Trazodone   Screenings: GAD-7   Flowsheet Row Office Visit from 04/01/2020 in [facility] Visit from 02/23/2019 in [facility] [place]  Total GAD-7 Score 18 14    PHQ2-9   [place] Visit from 09/10/2020 in [facility] Visit from 04/01/2020 in [facility] Visit from 02/23/2019 in [facility] from 02/09/2019 in [facility] from 02/04/2018 in [facility]  PHQ-2 Total Score 3  6 2 5  0  PHQ-9 Total Score 11 26 12 12   -       Assessment and Plan: Patient appears to be depressed currently because of several stressors.  She was instructed to start taking 2 tablets up with him at night instead of 1.  She informed that this has helped her with her depressive symptoms in the past.  She is continuing to see her therapist every month.   1. Bipolar 1 disorder, depressed, moderate (HCC)  - trazodone (DESYREL) 300 MG tablet; Take 1 tablet (300 mg total) by mouth at bedtime.  Dispense: 90 tablet; Refill: 1 - venlafaxine XR (EFFEXOR XR) 75 MG 24 hr capsule; Take 3 capsules daily  Dispense: 270 capsule; Refill: 1 - gabapentin (NEURONTIN) 300 MG capsule; Take 1 capsule (300 mg total) by mouth 3 (three) times daily.  Dispense: 270 capsule; Refill: 1 - lithium 300 MG tablet; Take 1 tablet in the morning and take 2 tablets at night.  Dispense: 270 tablet; Refill: 1  2. Anxiety  - busPIRone (BUSPAR) 15 MG tablet; Take 1 tablet (15 mg total) by mouth 3 (three) times daily.  Dispense: 270 tablet; Refill: 1 - venlafaxine XR (EFFEXOR XR) 75 MG 24 hr capsule; Take 3 capsules daily  Dispense: 270 capsule; Refill: 1 - gabapentin (NEURONTIN) 300 MG capsule; Take 1 capsule (300 mg total) by mouth 3 (three) times daily.  Dispense: 270 capsule; Refill: 1   Continue individual therapy. Follow-up in 6 weeks.   [place] Crane, MD 10/09/2020, 2:52 PM

## 2020-10-22 ENCOUNTER — Telehealth (HOSPITAL_COMMUNITY): Payer: Self-pay | Admitting: *Deleted

## 2020-10-22 NOTE — Telephone Encounter (Signed)
I called and spoke with the patient.  Patient informed that she saw her therapist today and her therapist told her that she needs to contact the writer as soon as possible. Patient reported that a few days ago ever since when she increased her lithium dose to 1 in the morning and 2 at night she has noticed that she is not able to recall things as well as she used to.  She also has been having a lot of tremors and shakiness.  She is having a hard time getting her thoughts together.  She is also having some trouble while talking. Patient was noted to have somewhat pressured speech during the phone call.  Writer reviewed all her medications with her and after reviewing all her records we decided to lower the dose of lithium to 300 mg twice daily and add low-dose Seroquel at bedtime to see if that would help her. Seroquel was chosen as it has low propensity to cause extrapyramidal side effects.  Of note, patient has history of taking very high dose of Seroquel at 400 mg back in 2017.  Around 2016 she was evaluated by Dr. Weber Cooks for possibility of ECT treatment however patient changed her mind and never received any ECT sessions.  Patient informed that she does have some Seroquel tablets left from her old bottles.  Writer suggested that she restart the Seroquel at either 50 mg or 100 mg depending on the bottle that she can find.  Patient stated that she is agreeable to this plan.   Writer advised her to keep her scheduled appointment in March and to contact the writer if the need arises again.

## 2020-10-22 NOTE — Telephone Encounter (Signed)
Patient Elizabeth Malone stating that Lithium has been increased still has depression & that she is having the following side effects: can't get thoughts together, stuttering, shaking & jittery> And after appointment with her Therapist Marjie Skiff mentioned it could be maybe TD. Patient requested call back

## 2020-11-15 ENCOUNTER — Ambulatory Visit: Payer: Medicare PPO | Admitting: Internal Medicine

## 2020-11-15 ENCOUNTER — Encounter: Payer: Self-pay | Admitting: Internal Medicine

## 2020-11-15 ENCOUNTER — Other Ambulatory Visit: Payer: Self-pay

## 2020-11-15 DIAGNOSIS — K121 Other forms of stomatitis: Secondary | ICD-10-CM | POA: Diagnosis not present

## 2020-11-15 DIAGNOSIS — M2560 Stiffness of unspecified joint, not elsewhere classified: Secondary | ICD-10-CM

## 2020-11-15 MED ORDER — ACYCLOVIR 5 % EX OINT: TOPICAL_OINTMENT | Freq: Three times a day (TID) | CUTANEOUS | Status: DC

## 2020-11-15 NOTE — Progress Notes (Signed)
There were no vitals taken for this visit.   Subjective:    Patient ID: Elizabeth Malone, female    DOB: Dec 06, 1950, 70 y.o.   MRN: [id number]  Elizabeth Malone is a 70 y.o. female  depresseion since christmas time - says she has been staying home , moms had Alzheimer and is on hospice for her. Affecting her much more per pt. Has been more depressed,  Husbands not feeling well has had seizures   Shoulder Pain   Knee Pain  The incident occurred more than 1 week ago (4-5 days ago has had stiffness knees lock on her shuffling due to such). Injury mechanism: had an injury playing volley ball in her 20';s.  Mouth Lesions  Associated symptoms include mouth sores.  Depression        This is a new problem.  The current episode started more than 1 month ago.   The onset quality is gradual.   Associated symptoms include fatigue, helplessness, hopelessness, decreased interest and body aches.  Associated symptoms include no decreased concentration, does not have insomnia, not irritable, no appetite change, no indigestion and no suicidal ideas.   Chief Complaint  Patient presents with  . Shoulder Pain    Shoulders have been painful since before December, is stating that she is losing ROM  . Knee Pain    Knees have been feeling as if they are locking up at night  . Mouth Lesions    Relevant past medical, surgical, family and social history reviewed and updated as indicated. Interim medical history since our last visit reviewed. Allergies and medications reviewed and updated.  Review of Systems  Constitutional: Positive for fatigue. Negative for appetite change.  HENT: Positive for mouth sores.   Psychiatric/Behavioral: Positive for depression. Negative for decreased concentration and suicidal ideas. The patient does not have insomnia.     Per HPI unless specifically indicated above     Objective:    There were no vitals taken for this visit.  Wt Readings from Last 3 Encounters:   09/10/20 217 lb (98.4 kg)  04/22/20 210 lb (95.3 kg)  04/01/20 210 lb (95.3 kg)    Physical Exam Constitutional:      General: She is not irritable.    Appearance: Normal appearance. She is normal weight.  Musculoskeletal:        General: Tenderness present. No swelling, deformity or signs of injury.     Right shoulder: No swelling, deformity or bony tenderness. Decreased range of motion.     Left shoulder: No swelling, deformity or bony tenderness. Decreased range of motion.     Left lower leg: No edema.     Comments: Decreased ROM bil upper shoulders Stiffness noted in bil knees.   Neurological:     Mental Status: She is alert.     Results for orders placed or performed in visit on 01/77/93  Basic metabolic panel  Result Value Ref Range   Glucose 97 65 - 99 mg/dL   BUN 13 8 - 27 mg/dL   Creatinine, Ser 0.83 0.57 - 1.00 mg/dL   GFR calc non Af Amer 72 >59 mL/min/1.73   GFR calc Af Amer 83 >59 mL/min/1.73   BUN/Creatinine Ratio 16 12 - 28   Sodium 138 134 - 144 mmol/L   Potassium 5.0 3.5 - 5.2 mmol/L   Chloride 101 96 - 106 mmol/L   CO2 24 20 - 29 mmol/L   Calcium 9.8 8.7 - 10.3 mg/dL  Assessment & Plan:  1. Knee pain / shoulder pain :  Will check xrays  Unable ec to arthritis ?  RA vs Gout CHeck RF/ ANA / uric acid. check xrays of swollen joints. pt takes pain meds for such.  2. Depression : / BPD to fu with Dr. Toy Care. Is on lithium and trazodone.  buspirone and seroquel sees psychiatrist      Problem List Items Addressed This Visit   None   Visit Diagnoses    Stiffness in joint    -  Primary   Relevant Orders   DG KNEE 3 VIEW RIGHT   DG KNEE 3 VIEW LEFT   DG Shoulder Left   DG Shoulder Right   Lyme disease, western blot   Ambulatory referral to Physical Therapy   Rheumatoid factor   Uric acid   ANA w/Reflex if Positive   Comprehensive metabolic panel   Mouth ulcer       Relevant Medications   acyclovir ointment (ZOVIRAX) 5 %

## 2020-11-20 ENCOUNTER — Telehealth (HOSPITAL_COMMUNITY): Payer: Medicare PPO | Admitting: Psychiatry

## 2020-11-21 ENCOUNTER — Telehealth (INDEPENDENT_AMBULATORY_CARE_PROVIDER_SITE_OTHER): Payer: Medicare PPO | Admitting: Psychiatry

## 2020-11-21 ENCOUNTER — Other Ambulatory Visit: Payer: Self-pay

## 2020-11-21 ENCOUNTER — Encounter (HOSPITAL_COMMUNITY): Payer: Self-pay | Admitting: Psychiatry

## 2020-11-21 DIAGNOSIS — F419 Anxiety disorder, unspecified: Secondary | ICD-10-CM | POA: Diagnosis not present

## 2020-11-21 DIAGNOSIS — F3175 Bipolar disorder, in partial remission, most recent episode depressed: Secondary | ICD-10-CM | POA: Diagnosis not present

## 2020-11-21 MED ORDER — BUSPIRONE HCL 15 MG PO TABS: 15.0000 mg | ORAL_TABLET | Freq: Three times a day (TID) | ORAL | 0 refills | Status: DC

## 2020-11-21 MED ORDER — VENLAFAXINE HCL ER 75 MG PO CP24: ORAL_CAPSULE | ORAL | 0 refills | Status: DC

## 2020-11-21 MED ORDER — QUETIAPINE FUMARATE 100 MG PO TABS: 100.0000 mg | ORAL_TABLET | Freq: Every day | ORAL | 0 refills | Status: DC

## 2020-11-21 MED ORDER — LITHIUM CARBONATE 300 MG PO TABS
300.0000 mg | ORAL_TABLET | Freq: Two times a day (BID) | ORAL | 0 refills | Status: DC
Start: 2020-11-21 — End: 2021-03-25

## 2020-11-21 MED ORDER — TRAZODONE HCL 300 MG PO TABS: 300.0000 mg | ORAL_TABLET | Freq: Every day | ORAL | 0 refills | Status: DC

## 2020-11-21 MED ORDER — GABAPENTIN 300 MG PO CAPS: 300.0000 mg | ORAL_CAPSULE | Freq: Three times a day (TID) | ORAL | 0 refills | Status: DC

## 2020-11-21 NOTE — Progress Notes (Signed)
[facility] MD OP Progress Note  Virtual Visit via Video Note  I connected with Elizabeth Malone on 11/21/20 at 11:00 AM EST by a video enabled telemedicine application and verified that I am speaking with the correct person using two identifiers.  Location: Patient: Home Provider: Clinic   I discussed the limitations of evaluation and management by telemedicine and the availability of in person appointments. The patient expressed understanding and agreed to proceed.  I provided 17 minutes of non-face-to-face time during this encounter.    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 On interview, she reports she is doing much better after increasing Seroquel to 100 mg at night, which has allowed her to sleep. Her tremors have improved since reducing the dose of Lithium, she takes 2 tabs of 300 mg at night. 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 Neurocognitive testing was also recommended for her, but she states that her husband is against it and she "does not want to fight that battle."  She was also recently seen by her PCP for joint stiffness above her shoulder. Labs were ordered but she said she had trouble interpreting the results. Informed patient that her ANA, rheumatoid factor, and uric acid labs were all negative/wnl. Her PCP also ordered PT and x-rays, and she is currently waiting on confirmation that PT will be covered by insurance.  She reports that she plans to resume attending church soon and has an upcoming speaking event with the [employer]. 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    Visit Diagnosis:    ICD-10-CM   1. Bipolar 1 disorder, depressed, partial remission (HCC)  F31.75 lithium 300 MG tablet    QUEtiapine (SEROQUEL) 100 MG tablet    gabapentin (NEURONTIN) 300 MG capsule    venlafaxine XR (EFFEXOR XR) 75 MG 24 hr capsule    trazodone (DESYREL) 300 MG tablet  2. Anxiety  F41.9 gabapentin (NEURONTIN) 300 MG capsule    venlafaxine XR (EFFEXOR XR) 75 MG 24 hr capsule    busPIRone (BUSPAR) 15 MG tablet    Past Psychiatric History: Bipolar disorder, anxiety  Past Medical History:  Past Medical History:  Diagnosis Date  . Anxiety   . Bipolar disorder ([place])   . CKD (chronic kidney disease) stage 3, GFR 30-59 ml/min (HCC)   . Complication of anesthesia   . Depression   . GERD (gastroesophageal reflux disease)    occasionally has to use tums  . Headache   . History of diverticulosis   . Hyperlipidemia   . Overactive bladder   . PONV (postoperative nausea and vomiting)   . Substance abuse ([place])    alcohol    Past Surgical History:  Procedure Laterality Date  . COLONOSCOPY WITH PROPOFOL N/A 10/07/2017   Procedure: COLONOSCOPY WITH PROPOFOL;  Surgeon: [name], MD;  Location: [facility];  Service: Endoscopy;  Laterality: N/A;  . FOOT SURGERY Bilateral   . KNEE SURGERY Left   .  POLYPECTOMY  10/07/2017   Procedure: POLYPECTOMY;  Surgeon: [name], MD;  Location: [facility];  Service: Endoscopy;;  . TONSILLECTOMY AND ADENOIDECTOMY      Family Psychiatric History: see below   Family History:  Family History  Problem Relation Age of Onset  . Alzheimer's disease Mother   . Bipolar disorder Mother   . Depression Mother   . Heart attack Father   . Diabetes Brother   . Obesity Brother   . Depression Maternal Grandfather   . Depression Daughter   . Stroke Maternal Grandmother   . Stroke Paternal Grandmother   .  Heart attack Paternal Grandfather   . Breast cancer Neg Hx     Social History:  Social History   Socioeconomic History  . Marital status: Married    Spouse name: Not on file  . Number of children: 2  . Years of education: Not on file  . Highest education level: Bachelor's degree (e.g., BA, AB, BS)  Occupational History  . Occupation: retired   Tobacco Use  . Smoking status: Former Smoker    Types: Cigarettes    Start date: 07/09/1967    Quit date: 07/08/1985    Years since quitting: 35.3  . Smokeless tobacco: Never Used  Vaping Use  . Vaping Use: Never used  Substance and Sexual Activity  . Alcohol use: Yes    Alcohol/week: 3.0 standard drinks    Types: 3 Shots of liquor per week    Comment: occassional  . Drug use: No  . Sexual activity: Not Currently  Other Topics Concern  . Not on file  Social History Narrative  . Not on file   Social Determinants of Health   Financial Resource Strain: Low Risk   . Difficulty of Paying Living Expenses: Not hard at all  Food Insecurity: No Food Insecurity  . Worried About Charity fundraiser in the Last Year: Never true  . Ran Out of Food in the Last Year: Never true  Transportation Needs: No Transportation Needs  . Lack of Transportation (Medical): No  . Lack of Transportation (Non-Medical): No  Physical Activity: Inactive  . Days of Exercise per Week: 0 days  . Minutes of Exercise per Session: 0 min  Stress: Stress Concern Present  . Feeling of Stress : Very much  Social Connections: Not on file    Allergies:  Allergies  Allergen Reactions  . Ace Inhibitors Other (See Comments)  . Fluoxetine Other (See Comments)  . Hydrocodone-Chlorpheniramine Other (See Comments)  . Paroxetine Hcl Other (See Comments)  . Penicillin G Benzathine Other (See Comments)  . Penicillin V Potassium Other (See Comments)  . Clindamycin Hcl Rash and Other (See Comments)    Katherina Right' syndrome  . Lamotrigine Rash    Metabolic Disorder  Labs: Lab Results  Component Value Date   HGBA1C 4.9 08/17/2017   No results found for: PROLACTIN Lab Results  Component Value Date   CHOL 201 (H) 04/01/2020   TRIG 283 (H) 04/01/2020   HDL 60 04/01/2020   CHOLHDL 3.2 02/23/2019   VLDL 39 (H) 08/17/2017   LDLCALC 94 04/01/2020   LDLCALC 102 (H) 08/25/2019   Lab Results  Component Value Date   TSH 3.080 02/23/2019   TSH 0.599 02/04/2018    Therapeutic Level Labs: Lab Results  Component Value Date   LITHIUM 0.72 04/22/2020   LITHIUM 0.74 11/01/2018   No results found for: VALPROATE No components found for:  CBMZ  Current Medications:  Current Outpatient Medications  Medication Sig Dispense Refill  . atorvastatin (LIPITOR) 20 MG tablet Take 1 tablet (20 mg total) by mouth daily. 90 tablet 4  . benazepril (LOTENSIN) 5 MG tablet Take 1 tablet (5 mg total) by mouth daily. 90 tablet 4  . busPIRone (BUSPAR) 15 MG tablet Take 1 tablet (15 mg total) by mouth 3 (three) times daily. 270 tablet 0  . Calcium Citrate-Vitamin D (CALCIUM + D PO) Take 1 tablet by mouth 2 (two) times daily.     . fexofenadine (ALLERGY RELIEF) 180 MG tablet TAKE 1 TABLET(180 MG) BY MOUTH DAILY 90 tablet 4  . fluticasone (FLONASE) 50 MCG/ACT nasal spray SHAKE LIQUID AND USE 2 SPRAYS IN EACH NOSTRIL DAILY 16 g 2  . gabapentin (NEURONTIN) 300 MG capsule Take 1 capsule (300 mg total) by mouth 3 (three) times daily. 270 capsule 0  . lithium 300 MG tablet Take 1 tablet (300 mg total) by mouth in the morning and at bedtime. 180 tablet 0  . metoprolol succinate (TOPROL-XL) 25 MG 24 hr tablet Take 1 tablet (25 mg total) by mouth daily. 90 tablet 4  . montelukast (SINGULAIR) 10 MG tablet Take 1 tablet (10 mg total) by mouth at bedtime. 90 tablet 4  . Multiple Vitamin (MULTIVITAMIN) tablet Take 1 tablet by mouth daily.    . Omega-3 Fatty Acids (FISH OIL) 1000 MG CAPS Take 1,000 mg by mouth 2 (two) times daily.     Marland Kitchen omeprazole (PRILOSEC) 40 MG capsule Take 1 capsule  (40 mg total) by mouth daily. 90 capsule 4  . oxybutynin (DITROPAN-XL) 10 MG 24 hr tablet Take 2 tablets (20 mg total) by mouth at bedtime. 180 tablet 4  . QUEtiapine (SEROQUEL) 100 MG tablet Take 1 tablet (100 mg total) by mouth at bedtime. 90 tablet 0  . trazodone (DESYREL) 300 MG tablet Take 1 tablet (300 mg total) by mouth at bedtime. 90 tablet 0  . triamcinolone (KENALOG) 0.1 % Apply 1 application topically 2 (two) times daily. (Patient not taking: Reported on 11/15/2020) 45 each 0  . trospium (SANCTURA) 20 MG tablet  (Patient not taking: Reported on 11/15/2020)    . venlafaxine XR (EFFEXOR XR) 75 MG 24 hr capsule Take 3 capsules daily 270 capsule 0   Current Facility-Administered Medications  Medication Dose Route Frequency Provider Last Rate Last Admin  . acyclovir ointment (ZOVIRAX) 5 %   Topical TID Vigg, Avanti, MD         Musculoskeletal: Strength & Muscle Tone: unable to assess due to telemed visit Gait & Station: unable to assess due to telemed visit Patient leans: unable to assess due to telemed visit  Psychiatric Specialty Exam: Review of Systems  There were no vitals taken for this visit.There is no height or weight on file to calculate BMI.  General Appearance: Fairly groomed  Eye Contact:  Good  Speech:  Clear and Coherent and Normal Rate  Volume:  Normal  Mood: Better  Affect: Congruent  Thought Process:  Coherent, Goal Directed and Linear  Orientation:  Full (Time, Place, and Person)  Thought Content: Logical , denied any paranoid delusions  Suicidal Thoughts:  No  Homicidal Thoughts:  No  Memory:  Recent;   Good Remote;   Good  Judgement:  Fair  Insight:  Fair  Psychomotor Activity:  Normal  Concentration:  Concentration: Good and Attention Span: Good  Recall:  Good  Fund of Knowledge: Good  Language: Good  Akathisia:  Negative  Handed:  Right  AIMS (if indicated): not done  Assets:  Communication Skills Desire for Improvement Financial  Resources/Insurance Housing Social Support  ADL's:  Intact  Cognition: WNL  Sleep:  Improved with Seroquel   Screenings: GAD-7   Flowsheet Row Office Visit from 11/15/2020 in [facility] Visit from 04/01/2020 in [facility] Visit from 02/23/2019 in [facility]  Total GAD-7 Score 15 18 14     PHQ2-9   [place] Visit from 11/15/2020 in [facility] Visit from 09/10/2020 in [facility] Visit from 04/01/2020 in [facility] Visit from 02/23/2019 in [facility] from 02/09/2019 in [facility]  PHQ-2 Total Score 6 3 6 2 5   PHQ-9 Total Score 23 11 26 12 12        Assessment and Plan: Patient is a 70 year old female with bipolar disorder, 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 Her depression appears to be improving as evidenced by her improved sleep and increased interest in social activities. She was instructed to continue her current medication regimen as it is helping and to continue to meet with her therapist regularly.   1. Bipolar 1 disorder, depressed, moderate (HCC)  - trazodone (DESYREL) 300 MG tablet; Take 1 tablet (300 mg total) by mouth at bedtime.  Dispense: 90 tablet; Refill: 1 - venlafaxine XR (EFFEXOR XR) 75 MG 24 hr capsule; Take 3 capsules daily  Dispense: 270 capsule; Refill: 1 - gabapentin (NEURONTIN) 300 MG capsule; Take 1 capsule (300 mg total) by mouth 3 (three) times daily.  Dispense: 270 capsule; Refill: 1 - lithium 300 MG tablet; taking 2 tablets at night.  Dispense: 270 tablet; Refill: 1  2. Anxiety  - busPIRone (BUSPAR) 15 MG tablet; Take 1 tablet (15 mg total) by mouth 3 (three) times daily.  Dispense: 270 tablet; Refill: 1 - venlafaxine XR (EFFEXOR XR) 75 MG 24 hr capsule; Take 3 capsules daily  Dispense: 270 capsule; Refill: 1 - gabapentin  (NEURONTIN) 300 MG capsule; Take 1 capsule (300 mg total) by mouth 3 (three) times daily.  Dispense: 270 capsule; Refill: 1   Continue individual therapy. Follow-up in 6 weeks.   [place] Crane, MD 11/21/2020, 11:18 AM

## 2020-11-22 ENCOUNTER — Telehealth: Payer: Self-pay

## 2020-11-22 ENCOUNTER — Other Ambulatory Visit: Payer: Self-pay

## 2020-11-22 DIAGNOSIS — K121 Other forms of stomatitis: Secondary | ICD-10-CM

## 2020-11-22 MED ORDER — ACYCLOVIR 5 % EX OINT: 1.0000 "application " | TOPICAL_OINTMENT | CUTANEOUS | 1 refills | Status: DC

## 2020-11-22 NOTE — Telephone Encounter (Signed)
Routing to provider. Dr. Neomia Dear, can we send in the Acyclovir for the patient again? It was put in as a clinic administered med and not a prescription.

## 2020-11-22 NOTE — Telephone Encounter (Signed)
Copied from [employer] [id number]. Topic: General - Inquiry >> Nov 21, 2020  4:53 PM Greggory Keen D wrote: Reason for CRM: Pt called saying she never recd the treatment for her mouth ulcer/ fever blister.  She would like it sent in asap.  She also ask about the xrays that they talked about scheduling.  She uses [facility].  CB#  [phone]

## 2020-11-25 ENCOUNTER — Telehealth: Payer: Self-pay

## 2020-11-25 NOTE — Telephone Encounter (Signed)
PA started for acyclovir ointment through cover my meds.

## 2020-11-26 ENCOUNTER — Other Ambulatory Visit: Payer: Self-pay

## 2020-11-26 ENCOUNTER — Ambulatory Visit
Admission: RE | Admit: 2020-11-26 | Discharge: 2020-11-26 | Disposition: A | Discharge status: Home / Self Care | Payer: Medicare PPO | Source: Ambulatory Visit | Attending: Internal Medicine | Admitting: Internal Medicine

## 2020-11-26 ENCOUNTER — Telehealth: Payer: Self-pay

## 2020-11-26 DIAGNOSIS — M2560 Stiffness of unspecified joint, not elsewhere classified: Secondary | ICD-10-CM | POA: Insufficient documentation

## 2020-11-26 LAB — LYME DISEASE, WESTERN BLOT
IgG P18 Ab.: ABSENT
IgG P23 Ab.: ABSENT
IgG P28 Ab.: ABSENT
IgG P30 Ab.: ABSENT
IgG P39 Ab.: ABSENT
IgG P41 Ab.: ABSENT
IgG P45 Ab.: ABSENT
IgG P58 Ab.: ABSENT
IgG P66 Ab.: ABSENT
IgG P93 Ab.: ABSENT
IgM P23 Ab.: ABSENT
IgM P39 Ab.: ABSENT
IgM P41 Ab.: ABSENT
Lyme IgG Wb: NEGATIVE
Lyme IgM Wb: NEGATIVE

## 2020-11-26 LAB — ANA W/REFLEX IF POSITIVE: Anti Nuclear Antibody (ANA): NEGATIVE

## 2020-11-26 LAB — URIC ACID: Uric Acid: 5.8 mg/dL (ref 3.0–7.2)

## 2020-11-26 LAB — RHEUMATOID FACTOR: Rheumatoid fact SerPl-aCnc: 12.5 IU/mL (ref <14.0)

## 2020-11-26 NOTE — Telephone Encounter (Signed)
Called to inform patient that the acyclovir ointment was denied by insurance. Patient did state that she would be willing to take Acyclovir tablets instead.  Patient was also informed that all she has to do for her Hoover Brunette is to go to [facility] and have images done.

## 2020-11-26 NOTE — Progress Notes (Signed)
Please let pt know this was normal.

## 2020-11-27 ENCOUNTER — Ambulatory Visit (INDEPENDENT_AMBULATORY_CARE_PROVIDER_SITE_OTHER): Payer: Medicare PPO | Admitting: Internal Medicine

## 2020-11-27 ENCOUNTER — Encounter: Payer: Self-pay | Admitting: Internal Medicine

## 2020-11-27 VITALS — BP 112/73 | HR 76 | Temp 98.9°F | Ht 65.0 in | Wt 217.0 lb

## 2020-11-27 DIAGNOSIS — G8929 Other chronic pain: Secondary | ICD-10-CM

## 2020-11-27 DIAGNOSIS — M25562 Pain in left knee: Secondary | ICD-10-CM | POA: Diagnosis not present

## 2020-11-27 MED ORDER — TRIAMCINOLONE ACETONIDE 40 MG/ML IJ SUSP
40.0000 mg | Freq: Once | INTRAMUSCULAR | Status: AC
  Administered 2020-11-27: 40 mg via INTRAMUSCULAR

## 2020-11-27 NOTE — Progress Notes (Unsigned)
BP 112/73   Pulse 76   Temp 98.9 F (37.2 C) (Oral)   Ht 5\' 5"  (1.651 m)   Wt 217 lb (98.4 kg)   SpO2 98%   BMI 36.11 kg/m    Subjective:    Patient ID: Elizabeth Malone, female    DOB: 07/24/1951, 70 y.o.   MRN: [id number]  HPI: Elizabeth Malone is a 70 y.o. female  Pt I shere for a fu on her arthritis and c.o pain aobout 8 /10 in her knees worse on left than the right.   Arthritis Presents for follow-up visit. She complains of pain and stiffness. The symptoms have been worsening. Affected locations include the left knee and right knee. Associated symptoms include fatigue. Pertinent negatives include no diarrhea, dry eyes, dry mouth, dysuria, fever, pain at night, pain while resting, rash, Raynaud's syndrome, uveitis or weight loss.    No chief complaint on file.   Relevant past medical, surgical, family and social history reviewed and updated as indicated. Interim medical history since our last visit reviewed. Allergies and medications reviewed and updated.  Review of Systems  Constitutional: Positive for fatigue. Negative for fever and weight loss.  Gastrointestinal: Negative for diarrhea.  Genitourinary: Negative for dysuria.  Musculoskeletal: Positive for arthritis and stiffness.  Skin: Negative for rash.    Per HPI unless specifically indicated above     Objective:    BP 112/73   Pulse 76   Temp 98.9 F (37.2 C) (Oral)   Ht 5\' 5"  (1.651 m)   Wt 217 lb (98.4 kg)   SpO2 98%   BMI 36.11 kg/m   Wt Readings from Last 3 Encounters:  11/27/20 217 lb (98.4 kg)  09/10/20 217 lb (98.4 kg)  04/22/20 210 lb (95.3 kg)    Physical Exam Constitutional:      Appearance: Normal appearance. She is obese.  Musculoskeletal:        General: Swelling and tenderness present. No signs of injury.     Right lower leg: No edema.     Left lower leg: No edema.     Comments: decreased ROM of the left knee   Neurological:     Mental Status: She is alert.     Results for orders  placed or performed in visit on 11/15/20  Lyme disease, western blot  Result Value Ref Range     IgG P93 Ab. Absent      IgG P66 Ab. Absent      IgG P58 Ab. Absent      IgG P45 Ab. Absent      IgG P41 Ab. Absent      IgG P39 Ab. Absent      IgG P30 Ab. Absent      IgG P28 Ab. Absent      IgG P23 Ab. Absent      IgG P18 Ab. Absent    Lyme IgG Wb Negative      IgM P41 Ab. Absent      IgM P39 Ab. Absent      IgM P23 Ab. Absent    Lyme IgM Wb Negative   Rheumatoid factor  Result Value Ref Range   Rhuematoid fact SerPl-aCnc 12.5 <14.0 IU/mL  Uric acid  Result Value Ref Range   Uric Acid 5.8 3.0 - 7.2 mg/dL  ANA w/Reflex if Positive  Result Value Ref Range   Anti Nuclear Antibody (ANA) Negative Negative    Left knee xray   FINDINGS: No evidence  of acute fracture or dislocation. Mild medial and lateral tibiofemoral compartment space narrowing is seen. A small joint effusion is noted.  IMPRESSION: Mild degenerative changes with a small joint effusion.     Rt knee injeciton  FINDINGS: No evidence of acute fracture or dislocation. No evidence of arthropathy or other focal bone abnormality. A small joint effusion is seen.  IMPRESSION: 1. No acute osseous abnormality. 2. Small joint effusion.       Left shoulder xrya There is no evidence of acute fracture or dislocation. Degenerative changes are seen along the inferior medial aspect of the left humeral head. Degenerative changes seen involving the left acromioclavicular joint and left glenohumeral articulation. Soft tissues are unremarkable.  IMPRESSION: Degenerative changes without evidence of acute fracture or Dislocation.    Right shoulder xray  Degenerative changes are seen along the inferior medial aspect of the left humeral head. Degenerative changes seen involving the left acromioclavicular joint and left glenohumeral articulation. Soft tissues are unremarkable.  IMPRESSION: Degenerative  changes without evidence of acute fracture or dislocation. Assessment & Plan:  1. OA:  Arthritis  ? sec RA vs Gout CHeck RF/ ANA / uric acid-ve  xrays of swollen joints as above.  Will refer to PT - to see PT pt takes pain meds for such.  Problem List Items Addressed This Visit   None   Visit Diagnoses    Chronic pain of left knee    -  Primary   Relevant Medications   triamcinolone acetonide (KENALOG-40) injection 40 mg (Start on 11/27/2020 12:00 PM)         KNEE INJECTION  The risks, benefits and side effects of treatment were discussed with the patient. After consent was obtained, using sterile technique the left knee was prepped Pt's left knee joint prepped and cleaned with alcohol wipes.   patella was palpated and  pt injected with 4 ml of kenalog and 1 ml of lidocaine under aseptic conditions. no bleeding or post procedural pain noted. Pt felt better after procedure, tolerated procedure well.  The patient was advised to call the office if symptoms worsen or do not improve. pt to fu next week for an injection of her shoulder. The patient is asked to continue to rest the knee for a few more days before resuming regular activities .Call or return to clinic prn if such symptoms occur or the knee fails to improve as anticipated.

## 2020-11-29 ENCOUNTER — Ambulatory Visit: Payer: Medicare PPO | Admitting: Internal Medicine

## 2020-12-03 ENCOUNTER — Ambulatory Visit: Payer: Medicare PPO | Admitting: Physical Therapy

## 2020-12-04 ENCOUNTER — Ambulatory Visit: Payer: Medicare PPO | Admitting: Internal Medicine

## 2020-12-04 ENCOUNTER — Other Ambulatory Visit: Payer: Self-pay

## 2020-12-04 ENCOUNTER — Encounter: Payer: Self-pay | Admitting: Internal Medicine

## 2020-12-04 VITALS — BP 140/84 | HR 82 | Temp 98.4°F | Ht 65.0 in | Wt 217.0 lb

## 2020-12-04 DIAGNOSIS — G8929 Other chronic pain: Secondary | ICD-10-CM

## 2020-12-04 DIAGNOSIS — M25512 Pain in left shoulder: Secondary | ICD-10-CM

## 2020-12-04 MED ORDER — VALACYCLOVIR HCL 1 G PO TABS: 1000.0000 mg | ORAL_TABLET | Freq: Two times a day (BID) | ORAL | 0 refills | Status: DC

## 2020-12-04 MED ORDER — TRIAMCINOLONE ACETONIDE 40 MG/ML IJ SUSP
40.0000 mg | Freq: Once | INTRAMUSCULAR | Status: AC
  Administered 2020-12-04: 40 mg via INTRAMUSCULAR

## 2020-12-04 NOTE — Progress Notes (Signed)
Ht 5\' 5"  (1.651 m)   Wt 217 lb (98.4 kg)   BMI 36.11 kg/m    Subjective:    Patient ID: Elizabeth Malone, female    DOB: [birth year], 70 y.o.   MRN: [id number]  HPI: Elizabeth Malone is a 70 y.o. female  Shoulder Pain  This is a chronic (X-ray of her left shoulder she has degenerative changes) problem. The current episode started more than 1 year ago (Not able to raise her left shoulder above 45 degrees without pain.  No history of muscle weakness no history of a tear.). Pertinent negatives include no fever or numbness.    Chief Complaint  Patient presents with  . Shoulder Pain  . Fever blister    Relevant past medical, surgical, family and social history reviewed and updated as indicated. Interim medical history since our last visit reviewed. Allergies and medications reviewed and updated.  Review of Systems  Constitutional: Negative for activity change, appetite change, chills, fatigue and fever.  HENT: Negative for congestion, ear discharge, ear pain and facial swelling.   Eyes: Negative for pain and itching.  Respiratory: Negative for cough, chest tightness, shortness of breath and wheezing.   Cardiovascular: Negative for chest pain, palpitations and leg swelling.  Gastrointestinal: Negative for abdominal distention, abdominal pain, blood in stool, constipation, diarrhea, nausea and vomiting.  Endocrine: Negative for cold intolerance, heat intolerance, polydipsia, polyphagia and polyuria.  Genitourinary: Negative for difficulty urinating, dysuria, flank pain, frequency, hematuria and urgency.  Musculoskeletal: Negative for arthralgias, gait problem, joint swelling and myalgias.  Skin: Negative for color change, rash and wound.  Neurological: Negative for dizziness, tremors, speech difficulty, weakness, light-headedness, numbness and headaches.  Hematological: Does not bruise/bleed easily.  Psychiatric/Behavioral: Negative for agitation, confusion, decreased concentration, sleep  disturbance and suicidal ideas.    Per HPI unless specifically indicated above     Objective:    Ht 5\' 5"  (1.651 m)   Wt 217 lb (98.4 kg)   BMI 36.11 kg/m   Wt Readings from Last 3 Encounters:  12/04/20 217 lb (98.4 kg)  11/27/20 217 lb (98.4 kg)  09/10/20 217 lb (98.4 kg)    Physical Exam Vitals and nursing note reviewed.  Constitutional:      General: She is not in acute distress.    Appearance: Normal appearance. She is not ill-appearing or diaphoretic.  HENT:     Head: Normocephalic and atraumatic.     Right Ear: Tympanic membrane and external ear normal. There is no impacted cerumen.     Left Ear: External ear normal.     Nose: No congestion or rhinorrhea.     Mouth/Throat:     Pharynx: No oropharyngeal exudate or posterior oropharyngeal erythema.  Eyes:     Conjunctiva/sclera: Conjunctivae normal.     Pupils: Pupils are equal, round, and reactive to light.  Cardiovascular:     Rate and Rhythm: Normal rate and regular rhythm.     Heart sounds: No murmur heard. No friction rub. No gallop.   Pulmonary:     Effort: No respiratory distress.     Breath sounds: No stridor. No wheezing or rhonchi.  Chest:     Chest wall: No tenderness.  Abdominal:     General: Abdomen is flat. Bowel sounds are normal. There is no distension.     Palpations: Abdomen is soft. There is no mass.     Tenderness: There is no abdominal tenderness. There is no guarding.  Musculoskeletal:  General: No swelling or deformity.     Cervical back: Normal range of motion and neck supple. No rigidity or tenderness.     Right lower leg: No edema.     Left lower leg: No edema.  Skin:    General: Skin is warm and dry.     Coloration: Skin is not jaundiced.     Findings: No erythema.  Neurological:     Mental Status: She is alert and oriented to person, place, and time. Mental status is at baseline.  Psychiatric:        Judgment: Judgment normal.     Results for orders placed or performed  in visit on 11/15/20  Lyme disease, western blot  Result Value Ref Range     IgG P93 Ab. Absent      IgG P66 Ab. Absent      IgG P58 Ab. Absent      IgG P45 Ab. Absent      IgG P41 Ab. Absent      IgG P39 Ab. Absent      IgG P30 Ab. Absent      IgG P28 Ab. Absent      IgG P23 Ab. Absent      IgG P18 Ab. Absent    Lyme IgG Wb Negative      IgM P41 Ab. Absent      IgM P39 Ab. Absent      IgM P23 Ab. Absent    Lyme IgM Wb Negative   Rheumatoid factor  Result Value Ref Range   Rhuematoid fact SerPl-aCnc 12.5 <14.0 IU/mL  Uric acid  Result Value Ref Range   Uric Acid 5.8 3.0 - 7.2 mg/dL  ANA w/Reflex if Positive  Result Value Ref Range   Anti Nuclear Antibody (ANA) Negative Negative      Assessment & Plan:  .  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 Consider MRI of shoulders if pain worsens  Patient to undergo physical therapy she verbalized understanding of such  The risks, benefits and side effects of treatment were discussed with the patient. After consent was obtained, using sterile technique the left SHOULDER was prepped Pt's left SHOULDER  joint prepped and cleaned with alcohol wipes. ACROMIOCLAVICULAR joint was palpated and  pt injected with 4 ml of kenalog and 1 ml of lidocaine under aseptic conditions. no bleeding or post procedural pain noted. Pt felt better after procedure, tolerated procedure well.  The patient was advised to call the office if symptoms worsen or do not improve. The patient is asked to continue to rest the knee for a few more days before resuming regular activities .Call or return to clinic prn if such symptoms occur or the knee fails to improve as anticipated.    Problem List Items Addressed This Visit   None   Visit Diagnoses    Chronic left shoulder pain    -  Primary   Relevant Medications   triamcinolone acetonide (KENALOG-40) injection 40 mg (Completed)       Follow up plan: No follow-ups on  file.

## 2020-12-04 NOTE — Patient Instructions (Signed)
Patient is here for injection of her left shoulder

## 2020-12-05 ENCOUNTER — Ambulatory Visit: Payer: Medicare PPO | Admitting: Physical Therapy

## 2020-12-10 ENCOUNTER — Encounter: Payer: Medicare PPO | Admitting: Physical Therapy

## 2020-12-11 ENCOUNTER — Ambulatory Visit: Payer: Medicare PPO | Admitting: Internal Medicine

## 2020-12-11 ENCOUNTER — Other Ambulatory Visit: Payer: Self-pay

## 2020-12-11 ENCOUNTER — Encounter: Payer: Self-pay | Admitting: Internal Medicine

## 2020-12-11 VITALS — BP 136/84 | HR 55 | Temp 98.5°F | Ht 65.0 in | Wt 217.0 lb

## 2020-12-11 DIAGNOSIS — M25561 Pain in right knee: Secondary | ICD-10-CM | POA: Diagnosis not present

## 2020-12-11 MED ORDER — TRIAMCINOLONE ACETONIDE 40 MG/ML IJ SUSP
40.0000 mg | Freq: Once | INTRAMUSCULAR | Status: AC
  Administered 2020-12-11: 40 mg via INTRAMUSCULAR

## 2020-12-12 ENCOUNTER — Encounter: Payer: Medicare PPO | Admitting: Physical Therapy

## 2020-12-12 NOTE — Progress Notes (Signed)
BP 136/84   Pulse (!) 55   Temp 98.5 F (36.9 C) (Oral)   Ht 5\' 5"  (1.651 m)   Wt 217 lb (98.4 kg)   SpO2 98%   BMI 36.11 kg/m    Subjective:    Patient ID: Elizabeth Malone, female    DOB: 1950/09/20, 70 y.o.   MRN: [id number]  HPI: Elizabeth Malone is a 70 y.o. female  HPI  Chief Complaint  Patient presents with  . Joint injection    Right Knee, Patient states that Left shoulder is still a little painful. Patient states that she purchased a collagen supplement    Relevant past medical, surgical, family and social history reviewed and updated as indicated. Interim medical history since our last visit reviewed. Allergies and medications reviewed and updated.  Review of Systems  Constitutional: Negative for activity change, appetite change, chills, fatigue and fever.  HENT: Negative for congestion.   Eyes: Negative for visual disturbance.  Respiratory: Negative for apnea, cough, chest tightness, shortness of breath and wheezing.   Cardiovascular: Negative for chest pain, palpitations and leg swelling.  Gastrointestinal: Negative for abdominal distention, abdominal pain, diarrhea and nausea.  Endocrine: Negative for cold intolerance, heat intolerance, polydipsia, polyphagia and polyuria.  Genitourinary: Negative for difficulty urinating, frequency, hematuria and urgency.  Skin: Negative for color change and rash.  Neurological: Negative for dizziness, speech difficulty, weakness, light-headedness, numbness and headaches.  Psychiatric/Behavioral: Negative for behavioral problems and confusion. The patient is not nervous/anxious.     Per HPI unless specifically indicated above     Objective:    BP 136/84   Pulse (!) 55   Temp 98.5 F (36.9 C) (Oral)   Ht 5\' 5"  (1.651 m)   Wt 217 lb (98.4 kg)   SpO2 98%   BMI 36.11 kg/m   Wt Readings from Last 3 Encounters:  12/11/20 217 lb (98.4 kg)  12/04/20 217 lb (98.4 kg)  11/27/20 217 lb (98.4 kg)    Physical  Exam Constitutional:      Appearance: Normal appearance. She is obese.  Musculoskeletal:        General: Swelling and tenderness present. No signs of injury.     Right lower leg: No edema.     Left lower leg: No edema.  Skin:    General: Skin is warm.     Coloration: Skin is not pale.     Findings: No erythema or rash.  Neurological:     Mental Status: She is alert.  Psychiatric:        Mood and Affect: Mood normal.        Behavior: Behavior normal.     Results for orders placed or performed in visit on 11/15/20  Lyme disease, western blot  Result Value Ref Range     IgG P93 Ab. Absent      IgG P66 Ab. Absent      IgG P58 Ab. Absent      IgG P45 Ab. Absent      IgG P41 Ab. Absent      IgG P39 Ab. Absent      IgG P30 Ab. Absent      IgG P28 Ab. Absent      IgG P23 Ab. Absent      IgG P18 Ab. Absent    Lyme IgG Wb Negative      IgM P41 Ab. Absent      IgM P39 Ab. Absent      IgM P23  Ab. Absent    Lyme IgM Wb Negative   Rheumatoid factor  Result Value Ref Range   Rhuematoid fact SerPl-aCnc 12.5 <14.0 IU/mL  Uric acid  Result Value Ref Range   Uric Acid 5.8 3.0 - 7.2 mg/dL  ANA w/Reflex if Positive  Result Value Ref Range   Anti Nuclear Antibody (ANA) Negative Negative        Current Outpatient Medications:  .  atorvastatin (LIPITOR) 20 MG tablet, Take 1 tablet (20 mg total) by mouth daily., Disp: 90 tablet, Rfl: 4 .  benazepril (LOTENSIN) 5 MG tablet, Take 1 tablet (5 mg total) by mouth daily., Disp: 90 tablet, Rfl: 4 .  busPIRone (BUSPAR) 15 MG tablet, Take 1 tablet (15 mg total) by mouth 3 (three) times daily., Disp: 270 tablet, Rfl: 0 .  Calcium Citrate-Vitamin D (CALCIUM + D PO), Take 1 tablet by mouth 2 (two) times daily. , Disp: , Rfl:  .  fexofenadine (ALLERGY RELIEF) 180 MG tablet, TAKE 1 TABLET(180 MG) BY MOUTH DAILY, Disp: 90 tablet, Rfl: 4 .  fluticasone (FLONASE) 50 MCG/ACT nasal spray, SHAKE LIQUID AND USE 2 SPRAYS IN EACH NOSTRIL DAILY, Disp: 16 g,  Rfl: 2 .  gabapentin (NEURONTIN) 300 MG capsule, Take 1 capsule (300 mg total) by mouth 3 (three) times daily., Disp: 270 capsule, Rfl: 0 .  lithium 300 MG tablet, Take 1 tablet (300 mg total) by mouth in the morning and at bedtime., Disp: 180 tablet, Rfl: 0 .  metoprolol succinate (TOPROL-XL) 25 MG 24 hr tablet, Take 1 tablet (25 mg total) by mouth daily., Disp: 90 tablet, Rfl: 4 .  montelukast (SINGULAIR) 10 MG tablet, Take 1 tablet (10 mg total) by mouth at bedtime., Disp: 90 tablet, Rfl: 4 .  Multiple Vitamin (MULTIVITAMIN) tablet, Take 1 tablet by mouth daily., Disp: , Rfl:  .  Omega-3 Fatty Acids (FISH OIL) 1000 MG CAPS, Take 1,000 mg by mouth 2 (two) times daily. , Disp: , Rfl:  .  omeprazole (PRILOSEC) 40 MG capsule, Take 1 capsule (40 mg total) by mouth daily., Disp: 90 capsule, Rfl: 4 .  oxybutynin (DITROPAN-XL) 10 MG 24 hr tablet, Take 2 tablets (20 mg total) by mouth at bedtime., Disp: 180 tablet, Rfl: 4 .  QUEtiapine (SEROQUEL) 100 MG tablet, Take 1 tablet (100 mg total) by mouth at bedtime., Disp: 90 tablet, Rfl: 0 .  trazodone (DESYREL) 300 MG tablet, Take 1 tablet (300 mg total) by mouth at bedtime., Disp: 90 tablet, Rfl: 0 .  triamcinolone (KENALOG) 0.1 %, Apply 1 application topically 2 (two) times daily., Disp: 45 each, Rfl: 0 .  trospium (SANCTURA) 20 MG tablet, , Disp: , Rfl:  .  valACYclovir (VALTREX) 1000 MG tablet, Take 1 tablet (1,000 mg total) by mouth 2 (two) times daily., Disp: 20 tablet, Rfl: 0 .  venlafaxine XR (EFFEXOR XR) 75 MG 24 hr capsule, Take 3 capsules daily, Disp: 270 capsule, Rfl: 0    Assessment & Plan:   Knee injection   The risks, benefits and side effects of treatment were discussed with the patient. After consent was obtained, using sterile technique the right  knee was prepped Pt's right knee joint prepped and cleaned with alcohol wipes.   patella was palpated and  pt injected with 4 ml of kenalog and 1 ml of lidocaine under aseptic conditions. no  bleeding or post procedural pain noted. Pt felt better after procedure, tolerated procedure well.  The patient was advised to call the office if  symptoms worsen or do not improve.  The patient is asked to continue to rest the knee for a few more days before resuming regular activities .Call or return to clinic prn if such symptoms occur or the knee fails to improve as anticipated.     Problem List Items Addressed This Visit   None   Visit Diagnoses    Acute pain of right knee    -  Primary   Relevant Medications   triamcinolone acetonide (KENALOG-40) injection 40 mg (Completed)       Follow up plan: No follow-ups on file.

## 2020-12-13 ENCOUNTER — Other Ambulatory Visit: Payer: Self-pay

## 2020-12-13 ENCOUNTER — Ambulatory Visit: Payer: Medicare PPO | Attending: Internal Medicine

## 2020-12-13 DIAGNOSIS — R2689 Other abnormalities of gait and mobility: Secondary | ICD-10-CM | POA: Diagnosis present

## 2020-12-13 DIAGNOSIS — M6281 Muscle weakness (generalized): Secondary | ICD-10-CM | POA: Insufficient documentation

## 2020-12-13 DIAGNOSIS — R2681 Unsteadiness on feet: Secondary | ICD-10-CM | POA: Diagnosis present

## 2020-12-13 DIAGNOSIS — M25511 Pain in right shoulder: Secondary | ICD-10-CM | POA: Diagnosis present

## 2020-12-13 DIAGNOSIS — G8929 Other chronic pain: Secondary | ICD-10-CM | POA: Insufficient documentation

## 2020-12-13 DIAGNOSIS — M25512 Pain in left shoulder: Secondary | ICD-10-CM | POA: Diagnosis present

## 2020-12-13 NOTE — Therapy (Signed)
[place] [facility] SERVICES [address] [place], [place], [address] Phone: [phone]   Fax:  [phone]  Physical Therapy Evaluation  Patient Details  Name: Elizabeth Malone MRN: [id number] Date of Birth: 10/06/1950 Referring Provider (PT): Charlynne Cousins, MD   Encounter Date: 12/13/2020   PT End of Session - 12/13/20 1053    Visit Number 1    Number of Visits 9    Date for PT Re-Evaluation 02/07/21    Authorization Type eval performed 01/17/3874 (recert for 6/43/3295)    PT Start Time 1016    PT Stop Time 1118    PT Time Calculation (min) 62 min    Equipment Utilized During Treatment Gait belt    Activity Tolerance Patient tolerated treatment well    Behavior During Therapy WFL for tasks assessed/performed           Past Medical History:  Diagnosis Date  . Anxiety   . Bipolar disorder ([place])   . CKD (chronic kidney disease) stage 3, GFR 30-59 ml/min (HCC)   . Complication of anesthesia   . Depression   . GERD (gastroesophageal reflux disease)    occasionally has to use tums  . Headache   . History of diverticulosis   . Hyperlipidemia   . Macular degeneration   . Overactive bladder   . PONV (postoperative nausea and vomiting)   . Substance abuse ([place])    alcohol    Past Surgical History:  Procedure Laterality Date  . COLONOSCOPY WITH PROPOFOL N/A 10/07/2017   Procedure: COLONOSCOPY WITH PROPOFOL;  Surgeon: [name], MD;  Location: [facility];  Service: Endoscopy;  Laterality: N/A;  . FOOT SURGERY Bilateral   . KNEE SURGERY Left   . POLYPECTOMY  10/07/2017   Procedure: POLYPECTOMY;  Surgeon: [name], MD;  Location: [facility];  Service: Endoscopy;;  . TONSILLECTOMY AND ADENOIDECTOMY      There were no vitals filed for this visit.    Subjective Assessment - 12/13/20 1033    Subjective Pt reports she currently has B shoulder pain but no LE pain currently.    Pertinent History Pt is a 70 y/o  female presenting to therapy with c/o joint pain and stiffness. 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 Pt reports she had cortisone shot in R knee this past Wednesday, and the Wednesday prior had cortisone shot in L shoulder and L knee. Pt reports she thinks in approx. three weeks she will have shot in R shoulder. Pt states cortisone shots have "made a world of difference." Pt says this improvement has resolved her depression and she has now gone back to church for the first time. Pt has follow-up in three weeks with her doctor. Pt reports she still has difficulty with steps, and that she can only navigate steps/curbs by leading with her RLE. Pt reports surgeries on her feet back in 2009 for bunion surgery. Other pertinent PMH per chart includes essential HTN, hypercholesteremia, overactive bladder, anxiety, bipolar 1 disorder, depressed, partial remission, obesity, chronic pain of both shoulders, hyperlipidemia, GERD, macular degeneration, substance abuse, and CKD.    Limitations Sitting;Standing;Walking;Lifting;House hold activities;Writing;Reading   pt reports UE pain affects her ability to do meal prep in the kitchen   How long can you sit comfortably? 45 min to an hour; pt reports "it's hard to  get up" due to stiffness    How long can you stand comfortably? 15 minutes    How long can you walk comfortably? 5-10 minutes    Diagnostic tests Imaging per chart from 11/26/2020 of B shoulders: R UE "Degenerative changes without acute findings," and L UE "Degenerative changes without evidence of acute fracture or  dislocation."    Imaging of LES performed on 11/26/2020, DG knee complete 4 views R: "No evidence of acute fracture or dislocation. No evidence of arthropathy or other focal bone abnormality. A small joint effusionis seen."; DG knee complete 4 views L: "No  evidence of acute fracture or dislocation. Mild medial and lateral tibiofemoral compartment space narrowing is seen. A small joint effusion is noted."    Patient Stated Goals Ease with reaching into kitchen cabinent, walking comfortably    Currently in Pain? Yes    Pain Score 6     Pain Location Shoulder    Pain Orientation Right;Left    Pain Type Chronic pain    Pain Onset More than a month ago    Aggravating Factors  laying on left side    Pain Relieving Factors cortisone shot    Effect of Pain on Daily Activities limiting ADLs, limiting sleep            Subjective: Pt is a 70 y/o female presenting to therapy with c/o joint pain and stiffness. 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 Pt reports she had cortisone shot in R knee this past Wednesday, and the Wednesday prior had cortisone shot in L shoulder and L knee. Pt reports she thinks in approx. three weeks she will have shot in R shoulder. Pt states cortisone shots have "made a world of difference." Pt says this improvement has resolved her depression and she has now gone back to church for the first time. Pt has follow-up in three weeks with her doctor. Pt reports she still has difficulty with steps, and that she can only navigate steps/curbs by leading with her RLE. Pt reports surgeries on her feet back in 2009 for bunion surgery.     PAIN:  LE pain currently 0/10; worst knee pain 8.5/10  UE pain: pt rates L shoulder pain 5-6/10, R shoulder pain is 4.5/10. Pt rates worst pain in shoulders as 8/10.    Aggravating factors for shoulder pain: laying on left side  Relieving factors for pain: cortisone shot  ROM assessment: UE: L shoulder flexion: 100 deg R shoulder flexion 130 deg Pt shoulder abduction WFL B Pt pt reports she is unable to perform B UE extension and B UE IR necessary to  "take off my bra" and says she has to have her spouse help. Pt demonstrates impaired shoulder IR and ext. B.  LE ROM WFL for knee flex/ext. B  MMT UE: grossly 5/5 slight pain with L shoulder flexion and abduction  MMT LEs Graded on a 0-5 scale Muscle Group Left Right  Hip Flex 4/5 4+/5  Hip Abd 4+/5 4+/5  Hip Add 4+/5 4+/5  Knee ext 5/5 5/5*  Knee flex 4+/5 5/5  Ankle DF 5/5 5/5  Ankle PF 5/5 5/5   *painful   POSTURE: Elevated scapula B with increased IR of shoulders  SENSATION: WNL to light touch B Pt denies N/T  Coordination WNL  FUNCTIONAL MOBILITY: Difficulty performing transfers/decreased speed with pain (  see 5xSTS)   BALANCE:  SLS: <3 sec B LEs  GAIT: Decreased gait speed (see 10MWT), antalgic gait, decreased stance time on LLE   OUTCOME MEASURES:  TEST Outcome Interpretation  5 times sit<>stand 28.7 sec >60 yo, >15 sec indicates increased risk for falls  10 meter walk test  0.79     m/s <1.0 m/s indicates increased risk for falls; limited community ambulator   FOTO: 39%  LEFS: 26/80   QuickDash Disability/Symptome Score: 56.8   Assessment: Pt is a pleasant 70 y/o female referred to PT for joint stiffness where pt reports joint stiffness and pain in BUEs and BLEs is affecting her ADLs and ability to ambulate. Upon examination, the pt presents with impairments in UE ROM, pain, decreased LE strength and power, impaired gait speed/mechanics, and increased risk for falls. Pt FOTO score (39%), and LEFS (26/80), and QuickDash (56.8) scores all indicate gross impairment in functional mobility of UEs, LEs, and QOL. The pt also presents with decreased ability to perform transfers due these deficits. Last, the pt with impaired balance as indicated by inability to maintain SLB for more than 3 sec on B LEs. The pt will benefit from further skilled PT to improve mobility, LE strength and power, and gait speed and mechanics in order to decrease fall risk and improve ease with  ADLs.   HEP provided:  Access Code: [email or web address] URL: [email or web address] Date: 12/13/2020 Prepared by: Ricard Dillon  Exercises Seated March - 1 x daily - 4 x weekly - 3 sets - 10 reps Seated Hip Abduction with Resistance - 1 x daily - 4 x weekly - 3 sets - 10 reps  Objective measurements completed on examination: See above findings.    Patient will benefit from skilled therapeutic intervention in order to improve the following deficits and impairments:  Abnormal gait,Decreased activity tolerance,Decreased endurance,Decreased range of motion,Decreased strength,Hypomobility,Increased fascial restricitons,Impaired UE functional use,Improper body mechanics,Pain,Decreased balance,Decreased mobility,Difficulty walking,Increased muscle spasms,Impaired flexibility,Obesity  Visit Diagnosis: Other abnormalities of gait and mobility  Muscle weakness (generalized)  Unsteadiness on feet  Chronic left shoulder pain  Chronic right shoulder pain     Problem List Patient Active Problem List   Diagnosis Date Noted  . Obesity 09/10/2020  . Dermatitis 09/10/2020  . Chronic pain of both shoulders 09/10/2020  . Bipolar 1 disorder, depressed, partial remission ([place]) 08/15/2020  . Anxiety 08/29/2019  . Allergic rhinitis 02/23/2019  . Overactive bladder 08/17/2017  . Advanced care planning/counseling discussion 02/02/2017  . Essential hypertension 08/05/2015  . Hypercholesteremia 08/05/2015   Ricard Dillon PT, DPT  Zollie Pee 12/13/2020, 12:22 PM  [place] [facility] SERVICES [address] [place], [place], [address] Phone: [phone]   Fax:  [phone]  Name: Elizabeth Malone MRN: [id number] Date of Birth: June 25, 1951

## 2020-12-25 ENCOUNTER — Ambulatory Visit: Payer: Medicare PPO

## 2020-12-25 ENCOUNTER — Other Ambulatory Visit: Payer: Self-pay

## 2020-12-25 DIAGNOSIS — R2689 Other abnormalities of gait and mobility: Secondary | ICD-10-CM | POA: Diagnosis not present

## 2020-12-25 DIAGNOSIS — G8929 Other chronic pain: Secondary | ICD-10-CM

## 2020-12-25 DIAGNOSIS — M6281 Muscle weakness (generalized): Secondary | ICD-10-CM

## 2020-12-25 NOTE — Therapy (Signed)
[place] MAIN [facility] SERVICES [address] [place], [place], [address] Phone: [phone]   Fax:  [phone]  Physical Therapy Treatment  Patient Details  Name: Elizabeth Malone MRN: [id number] Date of Birth: October 30, 1950 Referring Provider (PT): Charlynne Cousins, MD   Encounter Date: 12/25/2020    Past Medical History:  Diagnosis Date  . Anxiety   . Bipolar disorder ([place])   . CKD (chronic kidney disease) stage 3, GFR 30-59 ml/min (HCC)   . Complication of anesthesia   . Depression   . GERD (gastroesophageal reflux disease)    occasionally has to use tums  . Headache   . History of diverticulosis   . Hyperlipidemia   . Macular degeneration   . Overactive bladder   . PONV (postoperative nausea and vomiting)   . Substance abuse ([place])    alcohol    Past Surgical History:  Procedure Laterality Date  . COLONOSCOPY WITH PROPOFOL N/A 10/07/2017   Procedure: COLONOSCOPY WITH PROPOFOL;  Surgeon: [name], MD;  Location: [place];  Service: Endoscopy;  Laterality: N/A;  . FOOT SURGERY Bilateral   . KNEE SURGERY Left   . POLYPECTOMY  10/07/2017   Procedure: POLYPECTOMY;  Surgeon: [name], MD;  Location: [place];  Service: Endoscopy;;  . TONSILLECTOMY AND ADENOIDECTOMY      There were no vitals filed for this visit.   TREATMENT  THEREX: Pt supine with hot pack applied to R and L shoulder for approx 10 min. Pt reports heat feels good. Pt skin checked at 5 min and after hot pack removed with no adverse reaction observed or reported to treatment. Pt educated on safe use of heat at home for approx. 15-20 minutes. Pt verbalized understanding.  PROM shoulder flexion and abduction, BUEs 2x20 for each movement PROM shoulder ER/IR 2x10 to each UE PVC pipe assisted shoulder abduction 15x each UE; VC/TC and demo for technique PVC pipe assisted shoulder flexion 3x10 BUEs; VC/TC and demo for technique Seated marches with  2.5# AW 2x20; VC/demo for technique Standing hip abduction with 2.5# AW 2x10 BLEs; VC/demo for technique  Education provided to pt throughout session in the form of VC/TC and demonstration to facilitate correct muscle activation and movement at target joints. Pt exhibits good carryover within session after cuing.     PT Short Term Goals - 12/13/20 1110      PT SHORT TERM GOAL #1   Title Patient will be independent in home exercise program to improve strength/mobility for better functional independence with ADLs.    Baseline 4/1: Issued seated marches, and seated hip abduction with RTB (see note for access code)    Time 4    Period Weeks    Status New    Target Date 01/10/21      PT SHORT TERM GOAL #2   Title Pt will improve SLB on BLEs to at least 6 seconds or greater to increase safety with navigating steps.    Baseline 4/1: unable to maintain SLB for >3 sec BLEs    Time 4    Status New    Target Date 01/10/21             PT Long Term Goals - 12/13/20 1110      PT LONG TERM GOAL #1   Title Patient will increase FOTO score to equal to or greater than 46 to demonstrate statistically significant improvement in mobility and quality of life.    Baseline 4/1: 39%  Time 8    Period Weeks    Status New    Target Date 02/07/21      PT LONG TERM GOAL #2   Title Patient (> 70 years old) will complete five times sit to stand test in < 15 seconds indicating an increased LE strength and improved balance.    Baseline 4/1 28.7 sec    Time 8    Status New    Target Date 02/07/21      PT LONG TERM GOAL #3   Title Patient will increase 10 meter walk test to >1.77m/s as to improve gait speed for better community ambulation and to reduce fall risk.    Baseline 4/1 0.79 m/s    Time 8    Period Weeks    Status New    Target Date 02/07/21      PT LONG TERM GOAL #4   Title Patient will increase BLE strength by 1/2 a point on MMT as to improve functional strength for independent gait,  increased standing tolerance and increased ADL ability.    Baseline 4/1: areas of impairmenet: Hip flexion L/R 4/5, 4+/5; Hip abduction 4+/5 B; hip adduction 4+/5 B; knee flexion 4+/5, 5/5    Time 8    Period Weeks    Status New    Target Date 02/07/21      PT LONG TERM GOAL #5   Title Patient will increase lower extremity functional scale to >60/80 to demonstrate improved functional mobility and increased tolerance with ADLs.    Baseline 4/1: 26/80    Time 8    Period Weeks    Status New    Target Date 02/07/21      Additional Long Term Goals   Additional Long Term Goals Yes      PT LONG TERM GOAL #6   Title Patient will decrease Quick DASH score by > 8 points demonstrating reduced self-reported upper extremity disability.    Baseline 4/1: 56.8    Time 8    Period Weeks    Status New    Target Date 02/07/21              Patient will benefit from skilled therapeutic intervention in order to improve the following deficits and impairments:     Visit Diagnosis: No diagnosis found.     Problem List Patient Active Problem List   Diagnosis Date Noted  . Obesity 09/10/2020  . Dermatitis 09/10/2020  . Chronic pain of both shoulders 09/10/2020  . Bipolar 1 disorder, depressed, partial remission ([place]) 08/15/2020  . Anxiety 08/29/2019  . Allergic rhinitis 02/23/2019  . Overactive bladder 08/17/2017  . Advanced care planning/counseling discussion 02/02/2017  . Essential hypertension 08/05/2015  . Hypercholesteremia 08/05/2015   Ricard Dillon PT, DPT 12/25/2020, 10:21 AM  [place] MAIN [facility] SERVICES [address] [place], [place], [address] Phone: [phone]   Fax:  [phone]  Name: SHALANDRIA ELSBERND MRN: [id number] Date of Birth: 12-23-1950

## 2021-01-06 ENCOUNTER — Telehealth (HOSPITAL_COMMUNITY): Payer: Medicare PPO | Admitting: Psychiatry

## 2021-01-07 ENCOUNTER — Ambulatory Visit: Payer: Medicare PPO | Admitting: Internal Medicine

## 2021-01-08 ENCOUNTER — Other Ambulatory Visit: Payer: Self-pay

## 2021-01-08 ENCOUNTER — Encounter: Payer: Self-pay | Admitting: Internal Medicine

## 2021-01-08 ENCOUNTER — Ambulatory Visit: Payer: Medicare PPO | Admitting: Internal Medicine

## 2021-01-08 VITALS — BP 132/83 | HR 89 | Temp 98.6°F | Ht 64.84 in | Wt 210.6 lb

## 2021-01-08 DIAGNOSIS — M25511 Pain in right shoulder: Secondary | ICD-10-CM

## 2021-01-08 DIAGNOSIS — I10 Essential (primary) hypertension: Secondary | ICD-10-CM

## 2021-01-08 DIAGNOSIS — M25512 Pain in left shoulder: Secondary | ICD-10-CM

## 2021-01-08 DIAGNOSIS — E7800 Pure hypercholesterolemia, unspecified: Secondary | ICD-10-CM | POA: Diagnosis not present

## 2021-01-08 DIAGNOSIS — G8929 Other chronic pain: Secondary | ICD-10-CM | POA: Diagnosis not present

## 2021-01-08 MED ORDER — METHYLPREDNISOLONE 4 MG PO TBPK: ORAL_TABLET | ORAL | 0 refills | Status: DC

## 2021-01-08 NOTE — Progress Notes (Signed)
BP 132/83   Pulse 89   Temp 98.6 F (37 C) (Oral)   Ht 5' 4.84" (1.647 m)   Wt 210 lb 9.6 oz (95.5 kg)   SpO2 99%   BMI 35.22 kg/m    Subjective:    Patient ID: Elizabeth Malone, female    DOB: 1951/02/20, 70 y.o.   MRN: [id number]  HPI: Elizabeth Malone is a 70 y.o. 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 Pertinent negatives include no inability to bear weight, numbness or tingling.  Knee Pain  The incident occurred more than 1 week ago. There was no injury mechanism. The pain is at a severity of 3/10. The pain has been improving since onset. Pertinent negatives include no inability to bear weight, loss of motion, loss of sensation, muscle weakness, numbness or tingling. The treatment provided moderate relief.    Chief Complaint  Patient presents with  . Shoulder Pain    Left shoulder still painful  . Knee Pain    Left knee starting to become painful again.    Relevant past medical, surgical, family and social history reviewed and updated as indicated. Interim medical history since our last visit reviewed. Allergies and medications reviewed and updated.  Review of Systems  Neurological: Negative for tingling and numbness.    Per HPI unless specifically indicated above     Objective:    BP 132/83   Pulse 89   Temp 98.6 F (37 C) (Oral)   Ht 5' 4.84" (1.647 m)   Wt 210 lb 9.6 oz (95.5 kg)   SpO2 99%   BMI 35.22 kg/m   Wt Readings from Last 3 Encounters:  01/08/21 210 lb 9.6 oz (95.5 kg)  12/11/20 217 lb (98.4 kg)  12/04/20 217 lb (98.4 kg)    Physical  Exam Vitals and nursing note reviewed.  Constitutional:      General: She is not in acute distress.    Appearance: Normal appearance. She is not ill-appearing or diaphoretic.  HENT:     Head: Normocephalic and atraumatic.     Right Ear: Tympanic membrane and external ear normal. There is no impacted cerumen.     Left Ear: External ear normal.     Nose: No congestion or rhinorrhea.     Mouth/Throat:     Pharynx: No oropharyngeal exudate or posterior oropharyngeal erythema.  Eyes:     Conjunctiva/sclera: Conjunctivae normal.     Pupils: Pupils are equal, round, and reactive to light.  Cardiovascular:     Rate and Rhythm: Normal rate and regular rhythm.     Heart sounds: No murmur heard. No friction rub. No gallop.   Pulmonary:     Effort: No respiratory distress.     Breath sounds: No stridor. No wheezing or rhonchi.  Chest:     Chest wall: No tenderness.  Abdominal:     General: Abdomen is flat. Bowel sounds are normal. There is no distension.     Palpations: Abdomen is soft. There is no mass.     Tenderness: There is no abdominal tenderness. There is  no guarding.  Musculoskeletal:        General: No swelling or deformity.     Cervical back: Normal range of motion and neck supple. No rigidity or tenderness.     Right lower leg: No edema.     Left lower leg: No edema.  Skin:    General: Skin is warm and dry.     Coloration: Skin is not jaundiced.     Findings: No erythema.  Neurological:     Mental Status: She is alert and oriented to person, place, and time. Mental status is at baseline.  Psychiatric:        Mood and Affect: Mood normal.        Behavior: Behavior normal.        Thought Content: Thought content normal.        Judgment: Judgment normal.     Results for orders placed or performed in visit on 11/15/20  Lyme disease, western blot  Result Value Ref Range     IgG P93 Ab. Absent      IgG P66 Ab. Absent      IgG P58 Ab. Absent      IgG P45 Ab. Absent       IgG P41 Ab. Absent      IgG P39 Ab. Absent      IgG P30 Ab. Absent      IgG P28 Ab. Absent      IgG P23 Ab. Absent      IgG P18 Ab. Absent    Lyme IgG Wb Negative      IgM P41 Ab. Absent      IgM P39 Ab. Absent      IgM P23 Ab. Absent    Lyme IgM Wb Negative   Rheumatoid factor  Result Value Ref Range   Rhuematoid fact SerPl-aCnc 12.5 <14.0 IU/mL  Uric acid  Result Value Ref Range   Uric Acid 5.8 3.0 - 7.2 mg/dL  ANA w/Reflex if Positive  Result Value Ref Range   Anti Nuclear Antibody (ANA) Negative Negative        Current Outpatient Medications:  .  atorvastatin (LIPITOR) 20 MG tablet, Take 1 tablet (20 mg total) by mouth daily., Disp: 90 tablet, Rfl: 4 .  benazepril (LOTENSIN) 5 MG tablet, Take 1 tablet (5 mg total) by mouth daily., Disp: 90 tablet, Rfl: 4 .  busPIRone (BUSPAR) 15 MG tablet, Take 1 tablet (15 mg total) by mouth 3 (three) times daily., Disp: 270 tablet, Rfl: 0 .  Calcium Citrate-Vitamin D (CALCIUM + D PO), Take 1 tablet by mouth 2 (two) times daily. , Disp: , Rfl:  .  fexofenadine (ALLERGY RELIEF) 180 MG tablet, TAKE 1 TABLET(180 MG) BY MOUTH DAILY, Disp: 90 tablet, Rfl: 4 .  fluticasone (FLONASE) 50 MCG/ACT nasal spray, SHAKE LIQUID AND USE 2 SPRAYS IN EACH NOSTRIL DAILY, Disp: 16 g, Rfl: 2 .  gabapentin (NEURONTIN) 300 MG capsule, Take 1 capsule (300 mg total) by mouth 3 (three) times daily., Disp: 270 capsule, Rfl: 0 .  lithium 300 MG tablet, Take 1 tablet (300 mg total) by mouth in the morning and at bedtime., Disp: 180 tablet, Rfl: 0 .  metoprolol succinate (TOPROL-XL) 25 MG 24 hr tablet, Take 1 tablet (25 mg total) by mouth daily., Disp: 90 tablet, Rfl: 4 .  montelukast (SINGULAIR) 10 MG tablet, Take 1 tablet (10 mg total) by mouth at bedtime., Disp: 90 tablet, Rfl: 4 .  Multiple Vitamin (MULTIVITAMIN) tablet, Take 1  tablet by mouth daily., Disp: , Rfl:  .  Omega-3 Fatty Acids (FISH OIL) 1000 MG CAPS, Take 1,000 mg by mouth 2 (two) times daily. , Disp: ,  Rfl:  .  omeprazole (PRILOSEC) 40 MG capsule, Take 1 capsule (40 mg total) by mouth daily., Disp: 90 capsule, Rfl: 4 .  oxybutynin (DITROPAN-XL) 10 MG 24 hr tablet, Take 2 tablets (20 mg total) by mouth at bedtime., Disp: 180 tablet, Rfl: 4 .  QUEtiapine (SEROQUEL) 100 MG tablet, Take 1 tablet (100 mg total) by mouth at bedtime., Disp: 90 tablet, Rfl: 0 .  trazodone (DESYREL) 300 MG tablet, Take 1 tablet (300 mg total) by mouth at bedtime., Disp: 90 tablet, Rfl: 0 .  triamcinolone (KENALOG) 0.1 %, Apply 1 application topically 2 (two) times daily., Disp: 45 each, Rfl: 0 .  trospium (SANCTURA) 20 MG tablet, , Disp: , Rfl:  .  valACYclovir (VALTREX) 1000 MG tablet, Take 1 tablet (1,000 mg total) by mouth 2 (two) times daily., Disp: 20 tablet, Rfl: 0 .  venlafaxine XR (EFFEXOR XR) 75 MG 24 hr capsule, Take 3 capsules daily, Disp: 270 capsule, Rfl: 0    Assessment & Plan:  1. OA :  Stable much improved   2. Right shoulder pain worse than every other joint Will send off medrol dose pak for such  Consider rt shoudle rinjecitons.  3. BPD is on effexor seroquel and neurontin  Sees Dr. Toy Care @ [place] [facility]  4, HTn  Is on benzapril for such metoprolol  HTN :  Continue current meds.  Medication compliance emphasised. pt advised to keep Bp logs. Pt verbalised understanding of the same. Pt to have a low salt diet . Exercise to reach a goal of at least 150 mins a week.  lifestyle modifications explained and pt understands importance of the above.    5. HLD is on atorvastatin  recheck FLP, check LFT's work on diet, SE of meds explained to pt. low fat and high fiber diet explained to pt.   Problem List Items Addressed This Visit   None      Follow up plan: Return in about 4 weeks (around 02/05/2021).

## 2021-01-10 ENCOUNTER — Ambulatory Visit: Payer: Medicare PPO

## 2021-01-17 ENCOUNTER — Ambulatory Visit: Payer: Medicare PPO

## 2021-01-20 ENCOUNTER — Telehealth (INDEPENDENT_AMBULATORY_CARE_PROVIDER_SITE_OTHER): Payer: Medicare PPO | Admitting: Psychiatry

## 2021-01-20 ENCOUNTER — Other Ambulatory Visit: Payer: Self-pay

## 2021-01-20 ENCOUNTER — Encounter (HOSPITAL_COMMUNITY): Payer: Self-pay | Admitting: Psychiatry

## 2021-01-20 DIAGNOSIS — F3176 Bipolar disorder, in full remission, most recent episode depressed: Secondary | ICD-10-CM | POA: Insufficient documentation

## 2021-01-20 DIAGNOSIS — F419 Anxiety disorder, unspecified: Secondary | ICD-10-CM | POA: Diagnosis not present

## 2021-01-20 NOTE — Progress Notes (Signed)
[place] MD OP Progress Note  Virtual Visit via Video Note  I connected with Elizabeth Malone on 01/20/21 at  2:50 PM EDT by a video enabled telemedicine application and verified that I am speaking with the correct person using two identifiers.  Location: Patient: Home Provider: Clinic   I discussed the limitations of evaluation and management by telemedicine and the availability of in person appointments. The patient expressed understanding and agreed to proceed.  I provided 15 minutes of non-face-to-face time during this encounter.      01/20/2021 2:58 PM Elizabeth Malone  MRN:  [id number]  Chief Complaint:  " I feel okay."  HPI: Patient reported that she feels okay.  She stated that her mood has been stable and she has not had frequent ups and downs in the past few weeks.  She is getting out of the house more and spending time with her friends outside. She informed that she recently was [facility] [employer].  She has gone back to church and has been engaging in church activities. She has been sleeping well at night. She has not had any significant anxiety issues lately. She informed that she was in [place] for a few weeks taking care of her grandchild because her daughter needed her help.  In a few weeks her daughter needs her back in [place] again and at the same time her son wants her to join him for a festival on [employer].  She is thinking about what should she needs to prioritize.   She stated that she has plenty of bottles of all the medications and she does not need any refills to be sent for now.  She also Probation officer to put a note on the prescriptions to the pharmacy in the future stating that patient needs to be contacted first before refills are sent.  Visit Diagnosis:    ICD-10-CM   1. Bipolar 1 disorder, depressed, full remission ([place])  F31.76   2. Anxiety  F41.9     Past Psychiatric History: Bipolar disorder, anxiety  Past Medical History:  Past Medical History:   Diagnosis Date  . Anxiety   . Bipolar disorder ([place])   . CKD (chronic kidney disease) stage 3, GFR 30-59 ml/min (HCC)   . Complication of anesthesia   . Depression   . GERD (gastroesophageal reflux disease)    occasionally has to use tums  . Headache   . History of diverticulosis   . Hyperlipidemia   . Macular degeneration   . Overactive bladder   . PONV (postoperative nausea and vomiting)   . Substance abuse ([place])    alcohol    Past Surgical History:  Procedure Laterality Date  . COLONOSCOPY WITH PROPOFOL N/A 10/07/2017   Procedure: COLONOSCOPY WITH PROPOFOL;  Surgeon: [name], MD;  Location: [place];  Service: Endoscopy;  Laterality: N/A;  . FOOT SURGERY Bilateral   . KNEE SURGERY Left   . POLYPECTOMY  10/07/2017   Procedure: POLYPECTOMY;  Surgeon: [name], MD;  Location: [place];  Service: Endoscopy;;  . TONSILLECTOMY AND ADENOIDECTOMY      Family Psychiatric History: see below   Family History:  Family History  Problem Relation Age of Onset  . Alzheimer's disease Mother   . Bipolar disorder Mother   . Depression Mother   . Heart attack Father   . Diabetes Brother   . Obesity Brother   . Depression Maternal Grandfather   . Depression Daughter   .  Stroke Maternal Grandmother   . Stroke Paternal Grandmother   . Heart attack Paternal Grandfather   . Breast cancer Neg Hx     Social History:  Social History   Socioeconomic History  . Marital status: Married    Spouse name: Not on file  . Number of children: 2  . Years of education: Not on file  . Highest education level: Bachelor's degree (e.g., BA, AB, BS)  Occupational History  . Occupation: retired   Tobacco Use  . Smoking status: Former Smoker    Types: Cigarettes    Start date: 07/09/1967    Quit date: 07/08/1985    Years since quitting: 35.5  . Smokeless tobacco: Never Used  Vaping Use  . Vaping Use: Never used  Substance and Sexual Activity  . Alcohol use: Yes     Alcohol/week: 3.0 standard drinks    Types: 3 Shots of liquor per week    Comment: occassional  . Drug use: No  . Sexual activity: Not Currently  Other Topics Concern  . Not on file  Social History Narrative  . Not on file   Social Determinants of Health   Financial Resource Strain: Low Risk   . Difficulty of Paying Living Expenses: Not hard at all  Food Insecurity: No Food Insecurity  . Worried About Charity fundraiser in the Last Year: Never true  . Ran Out of Food in the Last Year: Never true  Transportation Needs: No Transportation Needs  . Lack of Transportation (Medical): No  . Lack of Transportation (Non-Medical): No  Physical Activity: Inactive  . Days of Exercise per Week: 0 days  . Minutes of Exercise per Session: 0 min  Stress: Stress Concern Present  . Feeling of Stress : Very much  Social Connections: Not on file    Allergies:  Allergies  Allergen Reactions  . Ace Inhibitors Other (See Comments)  . Fluoxetine Other (See Comments)  . Hydrocodone-Chlorpheniramine Other (See Comments)  . Paroxetine Hcl Other (See Comments)  . Penicillin G Benzathine Other (See Comments)  . Penicillin V Potassium Other (See Comments)  . Clindamycin Hcl Rash and Other (See Comments)    Katherina Right' syndrome  . Lamotrigine Rash    Metabolic Disorder Labs: Lab Results  Component Value Date   HGBA1C 4.9 08/17/2017   No results found for: PROLACTIN Lab Results  Component Value Date   CHOL 201 (H) 04/01/2020   TRIG 283 (H) 04/01/2020   HDL 60 04/01/2020   CHOLHDL 3.2 02/23/2019   VLDL 39 (H) 08/17/2017   LDLCALC 94 04/01/2020   LDLCALC 102 (H) 08/25/2019   Lab Results  Component Value Date   TSH 3.080 02/23/2019   TSH 0.599 02/04/2018    Therapeutic Level Labs: Lab Results  Component Value Date   LITHIUM 0.72 04/22/2020   LITHIUM 0.74 11/01/2018   No results found for: VALPROATE No components found for:  CBMZ  Current Medications: Current Outpatient  Medications  Medication Sig Dispense Refill  . atorvastatin (LIPITOR) 20 MG tablet Take 1 tablet (20 mg total) by mouth daily. 90 tablet 4  . benazepril (LOTENSIN) 5 MG tablet Take 1 tablet (5 mg total) by mouth daily. 90 tablet 4  . busPIRone (BUSPAR) 15 MG tablet Take 1 tablet (15 mg total) by mouth 3 (three) times daily. 270 tablet 0  . Calcium Citrate-Vitamin D (CALCIUM + D PO) Take 1 tablet by mouth 2 (two) times daily.     . fexofenadine (ALLERGY RELIEF)  180 MG tablet TAKE 1 TABLET(180 MG) BY MOUTH DAILY 90 tablet 4  . fluticasone (FLONASE) 50 MCG/ACT nasal spray SHAKE LIQUID AND USE 2 SPRAYS IN EACH NOSTRIL DAILY 16 g 2  . gabapentin (NEURONTIN) 300 MG capsule Take 1 capsule (300 mg total) by mouth 3 (three) times daily. 270 capsule 0  . lithium 300 MG tablet Take 1 tablet (300 mg total) by mouth in the morning and at bedtime. 180 tablet 0  . methylPREDNISolone (MEDROL DOSEPAK) 4 MG TBPK tablet Use as directed 1 each 0  . metoprolol succinate (TOPROL-XL) 25 MG 24 hr tablet Take 1 tablet (25 mg total) by mouth daily. 90 tablet 4  . montelukast (SINGULAIR) 10 MG tablet Take 1 tablet (10 mg total) by mouth at bedtime. 90 tablet 4  . Multiple Vitamin (MULTIVITAMIN) tablet Take 1 tablet by mouth daily.    . Omega-3 Fatty Acids (FISH OIL) 1000 MG CAPS Take 1,000 mg by mouth 2 (two) times daily.     Marland Kitchen omeprazole (PRILOSEC) 40 MG capsule Take 1 capsule (40 mg total) by mouth daily. 90 capsule 4  . oxybutynin (DITROPAN-XL) 10 MG 24 hr tablet Take 2 tablets (20 mg total) by mouth at bedtime. 180 tablet 4  . QUEtiapine (SEROQUEL) 100 MG tablet Take 1 tablet (100 mg total) by mouth at bedtime. 90 tablet 0  . trazodone (DESYREL) 300 MG tablet Take 1 tablet (300 mg total) by mouth at bedtime. 90 tablet 0  . triamcinolone (KENALOG) 0.1 % Apply 1 application topically 2 (two) times daily. 45 each 0  . trospium (SANCTURA) 20 MG tablet     . valACYclovir (VALTREX) 1000 MG tablet Take 1 tablet (1,000 mg  total) by mouth 2 (two) times daily. 20 tablet 0  . venlafaxine XR (EFFEXOR XR) 75 MG 24 hr capsule Take 3 capsules daily 270 capsule 0   No current facility-administered medications for this visit.     Musculoskeletal: Strength & Muscle Tone: unable to assess due to telemed visit Gait & Station: unable to assess due to telemed visit Patient leans: unable to assess due to telemed visit  Psychiatric Specialty Exam: Review of Systems  There were no vitals taken for this visit.There is no height or weight on file to calculate BMI.  General Appearance: Fairly groomed  Eye Contact:  Good  Speech:  Clear and Coherent and Normal Rate  Volume:  Normal  Mood: Euthymic  Affect: Congruent  Thought Process:  Coherent, Goal Directed and Linear  Orientation:  Full (Time, Place, and Person)  Thought Content: Logical   Suicidal Thoughts:  No  Homicidal Thoughts:  No  Memory:  Recent;   Good Remote;   Good  Judgement:  Fair  Insight:  Fair  Psychomotor Activity:  Normal  Concentration:  Concentration: Good and Attention Span: Good  Recall:  Good  Fund of Knowledge: Good  Language: Good  Akathisia:  Negative  Handed:  Right  AIMS (if indicated): not done  Assets:  Communication Skills Desire for Improvement Financial Resources/Insurance Housing Social Support  ADL's:  Intact  Cognition: WNL  Sleep:  Improved with Seroquel   Screenings: GAD-7   Flowsheet Row Office Visit from 11/15/2020 in [facility] Visit from 04/01/2020 in [facility] Visit from 02/23/2019 in [facility]  Total GAD-7 Score 15 18 14     PHQ2-9   [place] Office Visit from 01/08/2021 in [facility] Visit from 12/11/2020 in [facility]  Office Visit from 11/27/2020 in [facility] Visit from 11/15/2020 in [facility] Visit from 09/10/2020 in [facility] Total Score 0 0 0 6 3   PHQ-9 Total Score -- -- 0 23 11       Assessment and Plan: Patient reported that she is doing better and is also involved in several activities outside the house.  She is sleeping well at night.  She will continue the same regimen.  1. Bipolar 1 disorder, depressed, moderate (HCC)  - trazodone (DESYREL) 300 MG tablet; Take 1 tablet (300 mg total) by mouth at bedtime.  Dispense: 90 tablet; Refill: 1 - venlafaxine XR (EFFEXOR XR) 75 MG 24 hr capsule; Take 3 capsules daily  Dispense: 270 capsule; Refill: 1 - gabapentin (NEURONTIN) 300 MG capsule; Take 1 capsule (300 mg total) by mouth 3 (three) times daily.  Dispense: 270 capsule; Refill: 1 - lithium 300 MG tablet; taking 2 tablets at night.  Dispense: 270 tablet; Refill: 1  2. Anxiety  - busPIRone (BUSPAR) 15 MG tablet; Take 1 tablet (15 mg total) by mouth 3 (three) times daily.  Dispense: 270 tablet; Refill: 1 - venlafaxine XR (EFFEXOR XR) 75 MG 24 hr capsule; Take 3 capsules daily  Dispense: 270 capsule; Refill: 1 - gabapentin (NEURONTIN) 300 MG capsule; Take 1 capsule (300 mg total) by mouth 3 (three) times daily.  Dispense: 270 capsule; Refill: 1   Continue individual therapy. Continue same regimen. Follow-up in 10 weeks. Patient was informed that her care is being transferred to a psychiatry provider at [facility] psychiatry clinic.  Patient verbalized her understanding.  [place] Crane, MD 01/20/2021, 2:58 PM

## 2021-01-22 ENCOUNTER — Ambulatory Visit: Payer: Medicare PPO | Attending: Internal Medicine

## 2021-01-28 ENCOUNTER — Ambulatory Visit: Payer: Medicare PPO

## 2021-01-28 ENCOUNTER — Telehealth (HOSPITAL_COMMUNITY): Payer: Self-pay | Admitting: *Deleted

## 2021-01-28 DIAGNOSIS — F3175 Bipolar disorder, in partial remission, most recent episode depressed: Secondary | ICD-10-CM

## 2021-01-28 MED ORDER — QUETIAPINE FUMARATE 100 MG PO TABS: 100.0000 mg | ORAL_TABLET | Freq: Every day | ORAL | 0 refills | Status: DC

## 2021-01-28 NOTE — Addendum Note (Signed)
Addended by: [place] Crane on: 01/28/2021 03:44 PM   Modules accepted: Orders

## 2021-01-28 NOTE — Telephone Encounter (Signed)
RX sent to [facility]

## 2021-01-28 NOTE — Telephone Encounter (Signed)
Called to state Elizabeth Malone contacted her and she needs a new rx for her Quetiapine called in. Reviewed chart and looks like she should have some till 02/20/21 but even if she does, her next appt is not till 7/12 with Dr Modesta Messing at the [place] office. She made the appt herself when no one called her to schedule it. Will reach out to Dr [facility] for rx for her Quetiapine. Asked patient about other meds and she said Quetiapine only one her pharmacy alerted her to, and she was fine for now with the others.

## 2021-02-04 ENCOUNTER — Ambulatory Visit: Payer: Medicare PPO | Admitting: Internal Medicine

## 2021-02-04 ENCOUNTER — Other Ambulatory Visit: Payer: Self-pay

## 2021-02-04 ENCOUNTER — Encounter: Payer: Self-pay | Admitting: Internal Medicine

## 2021-02-04 VITALS — BP 132/74 | HR 63 | Temp 98.8°F | Ht 64.84 in | Wt 212.2 lb

## 2021-02-04 DIAGNOSIS — I10 Essential (primary) hypertension: Secondary | ICD-10-CM | POA: Diagnosis not present

## 2021-02-04 DIAGNOSIS — E7800 Pure hypercholesterolemia, unspecified: Secondary | ICD-10-CM

## 2021-02-04 DIAGNOSIS — Z6836 Body mass index (BMI) 36.0-36.9, adult: Secondary | ICD-10-CM

## 2021-02-04 NOTE — Progress Notes (Signed)
BP 132/74   Pulse 63   Temp 98.8 F (37.1 C) (Oral)   Ht 5' 4.84" (1.647 m)   Wt 212 lb 3.2 oz (96.3 kg)   SpO2 97%   BMI 35.48 kg/m    Subjective:    Patient ID: Elizabeth Malone, female    DOB: Jul 04, 1951, 70 y.o.   MRN: [id number]  HPI: Elizabeth Malone is a 70 y.o. female  Pt is here for a follow up, was on medrol dose pak and felt better.   Hyperlipidemia This is a chronic problem.  Hypertension This is a chronic problem.  Arthritis Presents for follow-up visit. She reports no pain, stiffness, joint swelling or joint warmth.    Chief Complaint  Patient presents with  . Hyperlipidemia  . Hypertension  . B/L shoulder pain    Relevant past medical, surgical, family and social history reviewed and updated as indicated. Interim medical history since our last visit reviewed. Allergies and medications reviewed and updated.  Review of Systems  Musculoskeletal: Positive for arthritis. Negative for joint swelling and stiffness.    Per HPI unless specifically indicated above     Objective:    BP 132/74   Pulse 63   Temp 98.8 F (37.1 C) (Oral)   Ht 5' 4.84" (1.647 m)   Wt 212 lb 3.2 oz (96.3 kg)   SpO2 97%   BMI 35.48 kg/m   Wt Readings from Last 3 Encounters:  02/04/21 212 lb 3.2 oz (96.3 kg)  01/08/21 210 lb 9.6 oz (95.5 kg)  12/11/20 217 lb (98.4 kg)    Physical Exam Vitals and nursing note reviewed.  Constitutional:      General: She is not in acute distress.    Appearance: Normal appearance. She is not ill-appearing or diaphoretic.  HENT:     Head: Normocephalic and atraumatic.     Right Ear: Tympanic membrane and external ear normal. There is no impacted cerumen.     Left Ear: External ear normal.     Nose: No congestion or rhinorrhea.     Mouth/Throat:     Pharynx: No oropharyngeal exudate or posterior oropharyngeal erythema.  Eyes:     Conjunctiva/sclera: Conjunctivae normal.     Pupils: Pupils are equal, round, and reactive to light.   Cardiovascular:     Rate and Rhythm: Normal rate and regular rhythm.     Heart sounds: No murmur heard. No friction rub. No gallop.   Pulmonary:     Effort: No respiratory distress.     Breath sounds: No stridor. No wheezing or rhonchi.  Chest:     Chest wall: No tenderness.  Abdominal:     General: Abdomen is flat. Bowel sounds are normal. There is no distension.     Palpations: Abdomen is soft. There is no mass.     Tenderness: There is no abdominal tenderness. There is no guarding.  Musculoskeletal:        General: No swelling or deformity.     Cervical back: Normal range of motion and neck supple. No rigidity or tenderness.     Right lower leg: No edema.     Left lower leg: No edema.  Skin:    General: Skin is warm and dry.     Coloration: Skin is not jaundiced.     Findings: No erythema.  Neurological:     Mental Status: She is alert and oriented to person, place, and time. Mental status is at baseline.  Psychiatric:  Mood and Affect: Mood normal.        Behavior: Behavior normal.        Thought Content: Thought content normal.        Judgment: Judgment normal.     Results for orders placed or performed in visit on 11/15/20  Lyme disease, western blot  Result Value Ref Range     IgG P93 Ab. Absent      IgG P66 Ab. Absent      IgG P58 Ab. Absent      IgG P45 Ab. Absent      IgG P41 Ab. Absent      IgG P39 Ab. Absent      IgG P30 Ab. Absent      IgG P28 Ab. Absent      IgG P23 Ab. Absent      IgG P18 Ab. Absent    Lyme IgG Wb Negative      IgM P41 Ab. Absent      IgM P39 Ab. Absent      IgM P23 Ab. Absent    Lyme IgM Wb Negative   Rheumatoid factor  Result Value Ref Range   Rhuematoid fact SerPl-aCnc 12.5 <14.0 IU/mL  Uric acid  Result Value Ref Range   Uric Acid 5.8 3.0 - 7.2 mg/dL  ANA w/Reflex if Positive  Result Value Ref Range   Anti Nuclear Antibody (ANA) Negative Negative        Current Outpatient Medications:  .  atorvastatin  (LIPITOR) 20 MG tablet, Take 1 tablet (20 mg total) by mouth daily., Disp: 90 tablet, Rfl: 4 .  benazepril (LOTENSIN) 5 MG tablet, Take 1 tablet (5 mg total) by mouth daily., Disp: 90 tablet, Rfl: 4 .  busPIRone (BUSPAR) 15 MG tablet, Take 1 tablet (15 mg total) by mouth 3 (three) times daily., Disp: 270 tablet, Rfl: 0 .  Calcium Citrate-Vitamin D (CALCIUM + D PO), Take 1 tablet by mouth 2 (two) times daily. , Disp: , Rfl:  .  fexofenadine (ALLERGY RELIEF) 180 MG tablet, TAKE 1 TABLET(180 MG) BY MOUTH DAILY, Disp: 90 tablet, Rfl: 4 .  fluticasone (FLONASE) 50 MCG/ACT nasal spray, SHAKE LIQUID AND USE 2 SPRAYS IN EACH NOSTRIL DAILY, Disp: 16 g, Rfl: 2 .  gabapentin (NEURONTIN) 300 MG capsule, Take 1 capsule (300 mg total) by mouth 3 (three) times daily., Disp: 270 capsule, Rfl: 0 .  lithium 300 MG tablet, Take 1 tablet (300 mg total) by mouth in the morning and at bedtime., Disp: 180 tablet, Rfl: 0 .  methylPREDNISolone (MEDROL DOSEPAK) 4 MG TBPK tablet, Use as directed, Disp: 1 each, Rfl: 0 .  metoprolol succinate (TOPROL-XL) 25 MG 24 hr tablet, Take 1 tablet (25 mg total) by mouth daily., Disp: 90 tablet, Rfl: 4 .  montelukast (SINGULAIR) 10 MG tablet, Take 1 tablet (10 mg total) by mouth at bedtime., Disp: 90 tablet, Rfl: 4 .  Multiple Vitamin (MULTIVITAMIN) tablet, Take 1 tablet by mouth daily., Disp: , Rfl:  .  Omega-3 Fatty Acids (FISH OIL) 1000 MG CAPS, Take 1,000 mg by mouth 2 (two) times daily. , Disp: , Rfl:  .  omeprazole (PRILOSEC) 40 MG capsule, Take 1 capsule (40 mg total) by mouth daily., Disp: 90 capsule, Rfl: 4 .  oxybutynin (DITROPAN-XL) 10 MG 24 hr tablet, Take 2 tablets (20 mg total) by mouth at bedtime., Disp: 180 tablet, Rfl: 4 .  QUEtiapine (SEROQUEL) 100 MG tablet, Take 1 tablet (100 mg total) by mouth at  bedtime., Disp: 90 tablet, Rfl: 0 .  trazodone (DESYREL) 300 MG tablet, Take 1 tablet (300 mg total) by mouth at bedtime., Disp: 90 tablet, Rfl: 0 .  triamcinolone (KENALOG)  0.1 %, Apply 1 application topically 2 (two) times daily., Disp: 45 each, Rfl: 0 .  trospium (SANCTURA) 20 MG tablet, , Disp: , Rfl:  .  valACYclovir (VALTREX) 1000 MG tablet, Take 1 tablet (1,000 mg total) by mouth 2 (two) times daily., Disp: 20 tablet, Rfl: 0 .  venlafaxine XR (EFFEXOR XR) 75 MG 24 hr capsule, Take 3 capsules daily, Disp: 270 capsule, Rfl: 0    Assessment & Plan:  OA/ Bil shoulder pain : Stable.continue PT please x 2 sessions done already.  HTN : is on metoprolol  benzapril for such  Continue current meds.  Medication compliance emphasised. pt advised to keep Bp logs. Pt verbalised understanding of the same. Pt to have a low salt diet . Exercise to reach a goal of at least 150 mins a week.  lifestyle modifications explained and pt understands importance of the above.  BPD :  Psychiatrist fu with pt.  Is on lithium, effexor, buspar, gabapentin ,trazadone ,seroquel.   Problem List Items Addressed This Visit   None      Follow up plan: No follow-ups on file.

## 2021-02-05 LAB — CBC WITH DIFFERENTIAL/PLATELET
Basophils Absolute: 0 10*3/uL (ref 0.0–0.2)
Basos: 1 %
EOS (ABSOLUTE): 0.2 10*3/uL (ref 0.0–0.4)
Eos: 3 %
Hematocrit: 39.9 % (ref 34.0–46.6)
Hemoglobin: 12.9 g/dL (ref 11.1–15.9)
Immature Grans (Abs): 0 10*3/uL (ref 0.0–0.1)
Immature Granulocytes: 1 %
Lymphocytes Absolute: 1.5 10*3/uL (ref 0.7–3.1)
Lymphs: 20 %
MCH: 29.6 pg (ref 26.6–33.0)
MCHC: 32.3 g/dL (ref 31.5–35.7)
MCV: 92 fL (ref 79–97)
Monocytes Absolute: 0.6 10*3/uL (ref 0.1–0.9)
Monocytes: 8 %
Neutrophils Absolute: 5.4 10*3/uL (ref 1.4–7.0)
Neutrophils: 67 %
Platelets: 284 10*3/uL (ref 150–450)
RBC: 4.36 x10E6/uL (ref 3.77–5.28)
RDW: 14.5 % (ref 11.7–15.4)
WBC: 7.8 10*3/uL (ref 3.4–10.8)

## 2021-02-05 LAB — COMPREHENSIVE METABOLIC PANEL
ALT: 26 IU/L (ref 0–32)
AST: 15 IU/L (ref 0–40)
Albumin/Globulin Ratio: 2 (ref 1.2–2.2)
Albumin: 4.5 g/dL (ref 3.8–4.8)
Alkaline Phosphatase: 114 IU/L (ref 44–121)
BUN/Creatinine Ratio: 10 — ABNORMAL LOW (ref 12–28)
BUN: 8 mg/dL (ref 8–27)
Bilirubin Total: 0.4 mg/dL (ref 0.0–1.2)
CO2: 22 mmol/L (ref 20–29)
Calcium: 10.1 mg/dL (ref 8.7–10.3)
Chloride: 102 mmol/L (ref 96–106)
Creatinine, Ser: 0.81 mg/dL (ref 0.57–1.00)
Globulin, Total: 2.3 g/dL (ref 1.5–4.5)
Glucose: 98 mg/dL (ref 65–99)
Potassium: 4.9 mmol/L (ref 3.5–5.2)
Sodium: 139 mmol/L (ref 134–144)
Total Protein: 6.8 g/dL (ref 6.0–8.5)
eGFR: 79 mL/min/{1.73_m2} (ref >59)

## 2021-02-05 LAB — URINALYSIS, ROUTINE W REFLEX MICROSCOPIC
Bilirubin, UA: NEGATIVE
Glucose, UA: NEGATIVE
Ketones, UA: NEGATIVE
Nitrite, UA: NEGATIVE
Protein,UA: NEGATIVE
Specific Gravity, UA: 1.015 (ref 1.005–1.030)
Urobilinogen, Ur: 0.2 mg/dL (ref 0.2–1.0)
pH, UA: 7 (ref 5.0–7.5)

## 2021-02-05 LAB — LIPID PANEL
Chol/HDL Ratio: 3 ratio (ref 0.0–4.4)
Cholesterol, Total: 191 mg/dL (ref 100–199)
HDL: 64 mg/dL (ref >39)
LDL Chol Calc (NIH): 94 mg/dL (ref 0–99)
Triglycerides: 193 mg/dL — ABNORMAL HIGH (ref 0–149)
VLDL Cholesterol Cal: 33 mg/dL (ref 5–40)

## 2021-02-05 LAB — MICROSCOPIC EXAMINATION: Bacteria, UA: NONE SEEN

## 2021-02-05 LAB — TSH: TSH: 3.03 u[IU]/mL (ref 0.450–4.500)

## 2021-02-07 ENCOUNTER — Ambulatory Visit: Payer: Medicare PPO

## 2021-02-11 NOTE — Therapy (Signed)
[place] [facility] [address] [place], [place], [address] Phone: [phone]   Fax:  [phone]  Feb 11, 2021   No Recipients  Physical Therapy Discharge Summary  Patient: Elizabeth Malone  MRN: [id number]  Date of Birth: [birth year]   Diagnosis: No diagnosis found. Referring Provider (PT): Vigg, Avanti, MD   The above patient had been seen in Physical Therapy 2 times of 9 treatments scheduled with Multiple no shows and cancellations.  The treatment consisted of strengthening and ROM exercises for LE and UE The patient is: unable to determine as pt has not been to clinic since 4/13 and attended only initial evaluation and one treatment  Subjective: Pt has not attended PT since 4/13 and has called PT to self-discharge and has cancelled all remaining appointments. PT and pt communicated over phone today where pt reports her spouse no longer wants her to attend PT so they are cancelling her appointments. PT asked pt if she is safe at home to which pt replied yes, and said they determined to cancel appointments due to cost of PT.   Discharge Findings: unable to determine as pt has not been to clinic since 12/25/2020, attending only initial eval and one treatment.   Functional Status at Discharge:  unable to determine as pt has not been to clinic since 12/25/2020, attending only initial eval and one treatmen    Ricard Dillon PT, DPT  CC No Recipients  [place], [place], [address] Phone: [phone]   Fax:  [phone]  Patient: Elizabeth Malone  MRN: [id number]  Date of Birth: [birth year]

## 2021-02-17 ENCOUNTER — Ambulatory Visit: Payer: Medicare PPO

## 2021-02-18 ENCOUNTER — Other Ambulatory Visit: Payer: Self-pay

## 2021-02-18 ENCOUNTER — Ambulatory Visit: Payer: Medicare PPO | Admitting: Internal Medicine

## 2021-02-18 ENCOUNTER — Encounter: Payer: Self-pay | Admitting: Internal Medicine

## 2021-02-18 VITALS — BP 132/80 | HR 65 | Temp 99.0°F | Ht 64.84 in | Wt 210.0 lb

## 2021-02-18 DIAGNOSIS — E7800 Pure hypercholesterolemia, unspecified: Secondary | ICD-10-CM | POA: Diagnosis not present

## 2021-02-18 DIAGNOSIS — R350 Frequency of micturition: Secondary | ICD-10-CM

## 2021-02-18 LAB — URINALYSIS, ROUTINE W REFLEX MICROSCOPIC
Bilirubin, UA: NEGATIVE
Glucose, UA: NEGATIVE
Ketones, UA: NEGATIVE
Nitrite, UA: NEGATIVE
Protein,UA: NEGATIVE
RBC, UA: NEGATIVE
Specific Gravity, UA: 1.02 (ref 1.005–1.030)
Urobilinogen, Ur: 0.2 mg/dL (ref 0.2–1.0)
pH, UA: 7 (ref 5.0–7.5)

## 2021-02-18 LAB — MICROSCOPIC EXAMINATION: RBC, Urine: NONE SEEN /hpf (ref 0–2)

## 2021-02-18 NOTE — Progress Notes (Signed)
BP 132/80   Pulse 65   Temp 99 F (37.2 C) (Oral)   Ht 5' 4.84" (1.647 m)   Wt 210 lb (95.3 kg)   SpO2 97%   BMI 35.12 kg/m    Subjective:    Patient ID: Elizabeth Malone, female    DOB: 1951/08/01, 70 y.o.   MRN: [id number]  Chief Complaint  Patient presents with  . Hypertension    HPI: Elizabeth Malone is a 70 y.o. female  Hypertension This is a chronic (stable, bp - is on ) problem.  Arthritis Presents for follow-up visit. She reports no pain, stiffness, joint swelling or joint warmth. The symptoms have been improving. Pertinent negatives include no diarrhea, dry mouth, dysuria, fatigue, fever, pain at night, pain while resting, rash, Raynaud's syndrome, uveitis or weight loss.    Chief Complaint  Patient presents with  . Hypertension    Relevant past medical, surgical, family and social history reviewed and updated as indicated. Interim medical history since our last visit reviewed. Allergies and medications reviewed and updated.  Review of Systems  Constitutional: Negative for fatigue, fever and weight loss.  Gastrointestinal: Negative for diarrhea.  Genitourinary: Negative for dysuria.  Musculoskeletal: Positive for arthritis. Negative for joint swelling and stiffness.  Skin: Negative for rash.    Per HPI unless specifically indicated above     Objective:    BP 132/80   Pulse 65   Temp 99 F (37.2 C) (Oral)   Ht 5' 4.84" (1.647 m)   Wt 210 lb (95.3 kg)   SpO2 97%   BMI 35.12 kg/m   Wt Readings from Last 3 Encounters:  02/18/21 210 lb (95.3 kg)  02/04/21 212 lb 3.2 oz (96.3 kg)  01/08/21 210 lb 9.6 oz (95.5 kg)    Physical Exam Vitals and nursing note reviewed.  Constitutional:      General: She is not in acute distress.    Appearance: Normal appearance. She is not ill-appearing or diaphoretic.  HENT:     Head: Normocephalic and atraumatic.     Right Ear: Tympanic membrane and external ear normal. There is no impacted cerumen.     Left Ear:  External ear normal.     Nose: No congestion or rhinorrhea.     Mouth/Throat:     Pharynx: No oropharyngeal exudate or posterior oropharyngeal erythema.  Eyes:     Conjunctiva/sclera: Conjunctivae normal.     Pupils: Pupils are equal, round, and reactive to light.  Cardiovascular:     Rate and Rhythm: Normal rate and regular rhythm.     Heart sounds: No murmur heard. No friction rub. No gallop.   Pulmonary:     Effort: No respiratory distress.     Breath sounds: No stridor. No wheezing or rhonchi.  Chest:     Chest wall: No tenderness.  Abdominal:     General: Abdomen is flat. Bowel sounds are normal. There is no distension.     Palpations: Abdomen is soft. There is no mass.     Tenderness: There is no abdominal tenderness. There is no guarding.  Musculoskeletal:        General: No swelling or deformity.     Cervical back: Normal range of motion and neck supple. No rigidity or tenderness.     Right lower leg: No edema.     Left lower leg: No edema.  Skin:    General: Skin is warm and dry.     Coloration: Skin is not  jaundiced.     Findings: No erythema.  Neurological:     Mental Status: She is alert and oriented to person, place, and time. Mental status is at baseline.  Psychiatric:        Mood and Affect: Mood normal.        Behavior: Behavior normal.        Thought Content: Thought content normal.        Judgment: Judgment normal.     Results for orders placed or performed in visit on 02/04/21  Microscopic Examination   Urine  Result Value Ref Range   WBC, UA 6-10 (A) 0 - 5 /hpf   RBC 0-2 0 - 2 /hpf   Epithelial Cells (non renal) 0-10 0 - 10 /hpf   Bacteria, UA None seen None seen/Few  CBC with Differential/Platelet  Result Value Ref Range   WBC 7.8 3.4 - 10.8 x10E3/uL   RBC 4.36 3.77 - 5.28 x10E6/uL   Hemoglobin 12.9 11.1 - 15.9 g/dL   Hematocrit 39.9 34.0 - 46.6 %   MCV 92 79 - 97 fL   MCH 29.6 26.6 - 33.0 pg   MCHC 32.3 31.5 - 35.7 g/dL   RDW 14.5 11.7 -  15.4 %   Platelets 284 150 - 450 x10E3/uL   Neutrophils 67 Not Estab. %   Lymphs 20 Not Estab. %   Monocytes 8 Not Estab. %   Eos 3 Not Estab. %   Basos 1 Not Estab. %   Neutrophils Absolute 5.4 1.4 - 7.0 x10E3/uL   Lymphocytes Absolute 1.5 0.7 - 3.1 x10E3/uL   Monocytes Absolute 0.6 0.1 - 0.9 x10E3/uL   EOS (ABSOLUTE) 0.2 0.0 - 0.4 x10E3/uL   Basophils Absolute 0.0 0.0 - 0.2 x10E3/uL   Immature Granulocytes 1 Not Estab. %   Immature Grans (Abs) 0.0 0.0 - 0.1 x10E3/uL  Comprehensive metabolic panel  Result Value Ref Range   Glucose 98 65 - 99 mg/dL   BUN 8 8 - 27 mg/dL   Creatinine, Ser 0.81 0.57 - 1.00 mg/dL   eGFR 79 >59 mL/min/1.73   BUN/Creatinine Ratio 10 (L) 12 - 28   Sodium 139 134 - 144 mmol/L   Potassium 4.9 3.5 - 5.2 mmol/L   Chloride 102 96 - 106 mmol/L   CO2 22 20 - 29 mmol/L   Calcium 10.1 8.7 - 10.3 mg/dL   Total Protein 6.8 6.0 - 8.5 g/dL   Albumin 4.5 3.8 - 4.8 g/dL   Globulin, Total 2.3 1.5 - 4.5 g/dL   Albumin/Globulin Ratio 2.0 1.2 - 2.2   Bilirubin Total 0.4 0.0 - 1.2 mg/dL   Alkaline Phosphatase 114 44 - 121 IU/L   AST 15 0 - 40 IU/L   ALT 26 0 - 32 IU/L  Lipid panel  Result Value Ref Range   Cholesterol, Total 191 100 - 199 mg/dL   Triglycerides 193 (H) 0 - 149 mg/dL   HDL 64 >39 mg/dL   VLDL Cholesterol Cal 33 5 - 40 mg/dL   LDL Chol Calc (NIH) 94 0 - 99 mg/dL   Chol/HDL Ratio 3.0 0.0 - 4.4 ratio  TSH  Result Value Ref Range   TSH 3.030 0.450 - 4.500 uIU/mL  Urinalysis, Routine w reflex microscopic  Result Value Ref Range   Specific Gravity, UA 1.015 1.005 - 1.030   pH, UA 7.0 5.0 - 7.5   Color, UA Yellow Yellow   Appearance Ur Cloudy (A) Clear   Leukocytes,UA 2+ (A) Negative  Protein,UA Negative Negative/Trace   Glucose, UA Negative Negative   Ketones, UA Negative Negative   RBC, UA Trace (A) Negative   Bilirubin, UA Negative Negative   Urobilinogen, Ur 0.2 0.2 - 1.0 mg/dL   Nitrite, UA Negative Negative   Microscopic Examination See  below:         Current Outpatient Medications:  .  atorvastatin (LIPITOR) 20 MG tablet, Take 1 tablet (20 mg total) by mouth daily., Disp: 90 tablet, Rfl: 4 .  busPIRone (BUSPAR) 15 MG tablet, Take 1 tablet (15 mg total) by mouth 3 (three) times daily., Disp: 270 tablet, Rfl: 0 .  Calcium Citrate-Vitamin D (CALCIUM + D PO), Take 1 tablet by mouth 2 (two) times daily. , Disp: , Rfl:  .  fexofenadine (ALLERGY RELIEF) 180 MG tablet, TAKE 1 TABLET(180 MG) BY MOUTH DAILY, Disp: 90 tablet, Rfl: 4 .  fluticasone (FLONASE) 50 MCG/ACT nasal spray, SHAKE LIQUID AND USE 2 SPRAYS IN EACH NOSTRIL DAILY, Disp: 16 g, Rfl: 2 .  gabapentin (NEURONTIN) 300 MG capsule, Take 1 capsule (300 mg total) by mouth 3 (three) times daily., Disp: 270 capsule, Rfl: 0 .  lithium 300 MG tablet, Take 1 tablet (300 mg total) by mouth in the morning and at bedtime., Disp: 180 tablet, Rfl: 0 .  metoprolol succinate (TOPROL-XL) 25 MG 24 hr tablet, Take 1 tablet (25 mg total) by mouth daily., Disp: 90 tablet, Rfl: 4 .  montelukast (SINGULAIR) 10 MG tablet, Take 1 tablet (10 mg total) by mouth at bedtime., Disp: 90 tablet, Rfl: 4 .  Multiple Vitamin (MULTIVITAMIN) tablet, Take 1 tablet by mouth daily., Disp: , Rfl:  .  Omega-3 Fatty Acids (FISH OIL) 1000 MG CAPS, Take 1,000 mg by mouth 2 (two) times daily. , Disp: , Rfl:  .  omeprazole (PRILOSEC) 40 MG capsule, Take 1 capsule (40 mg total) by mouth daily., Disp: 90 capsule, Rfl: 4 .  oxybutynin (DITROPAN-XL) 10 MG 24 hr tablet, Take 2 tablets (20 mg total) by mouth at bedtime., Disp: 180 tablet, Rfl: 4 .  QUEtiapine (SEROQUEL) 100 MG tablet, Take 1 tablet (100 mg total) by mouth at bedtime., Disp: 90 tablet, Rfl: 0 .  trazodone (DESYREL) 300 MG tablet, Take 1 tablet (300 mg total) by mouth at bedtime., Disp: 90 tablet, Rfl: 0 .  triamcinolone (KENALOG) 0.1 %, Apply 1 application topically 2 (two) times daily., Disp: 45 each, Rfl: 0 .  trospium (SANCTURA) 20 MG tablet, , Disp: , Rfl:   .  valACYclovir (VALTREX) 1000 MG tablet, Take 1 tablet (1,000 mg total) by mouth 2 (two) times daily., Disp: 20 tablet, Rfl: 0 .  venlafaxine XR (EFFEXOR XR) 75 MG 24 hr capsule, Take 3 capsules daily, Disp: 270 capsule, Rfl: 0    Assessment & Plan:  1. HLD increase Lipitor to 40 mg daily.  Pt has a 90 days supply will send in more once out. To fu with me for knee injections x July.  recheck FLP, check LFT's work on diet, SE of meds explained to pt. low fat and high fiber diet explained to pt.   2. Urinary pressure / urinary freq : Took azo not too long ago.  check UA.  pt is currently symptomatic for an Urinary tract infection(abd pain, burning etc), will cover with emperic abx, see med module for details.  encouraged to increase water/fluid intake.Signs and symptoms of emergency were discussed with the patient. The risks, benefits and side effects of treatment were discussed with  the patient. The patient verbalized an understanding of plan, and was told to call the clinic/go to the ED if symptoms worsen at any point of time.  3. Knee injection next visit : Last done on 3/16  Problem List Items Addressed This Visit      Other   Hypercholesteremia    Other Visit Diagnoses    Urinary frequency    -  Primary   Relevant Orders   Urinalysis, dipstick only       Follow up plan: No follow-ups on file.

## 2021-02-19 ENCOUNTER — Encounter: Payer: Self-pay | Admitting: Internal Medicine

## 2021-02-25 ENCOUNTER — Other Ambulatory Visit: Payer: Self-pay | Admitting: Family Medicine

## 2021-02-25 ENCOUNTER — Other Ambulatory Visit (HOSPITAL_COMMUNITY): Payer: Self-pay | Admitting: Psychiatry

## 2021-02-25 DIAGNOSIS — F3175 Bipolar disorder, in partial remission, most recent episode depressed: Secondary | ICD-10-CM

## 2021-03-19 ENCOUNTER — Telehealth: Payer: Self-pay | Admitting: Internal Medicine

## 2021-03-19 NOTE — Telephone Encounter (Signed)
Copied from [place] [id number]. Topic: Medicare AWV >> Mar 19, 2021  9:48 AM Cher Nakai R wrote: Reason for CRM:  LM re: N[facility] scheduled changed AWVS rescheduled to April 04, 2021 at 10:30 to be done by phone not in the office-srs

## 2021-03-20 NOTE — Progress Notes (Signed)
Virtual Visit via Video Note  I connected with Elizabeth Malone on 03/25/21 at  9:00 AM EDT by a video enabled telemedicine application and verified that I am speaking with the correct person using two identifiers.  Location: Patient: home Provider: office Persons participated in the visit- patient, provider    I discussed the limitations of evaluation and management by telemedicine and the availability of in person appointments. The patient expressed understanding and agreed to proceed.   I discussed the assessment and treatment plan with the patient. The patient was provided an opportunity to ask questions and all were answered. The patient agreed with the plan and demonstrated an understanding of the instructions.   The patient was advised to call back or seek an in-person evaluation if the symptoms worsen or if the condition fails to improve as anticipated.  I provided 35 minutes of non-face-to-face time during this encounter.   [name], MD    [facility] MD/PA/NP OP Progress Note  03/25/2021 9:52 AM Elizabeth Malone  MRN:  [id number]  Chief Complaint:  Chief Complaint   Follow-up; Other    HPI: Elizabeth Malone is a 70 y.o. year old female with a history of bipolar I disorder, alcohol abuse, stage III CKD, hypertension, GERD, PONV, hyperlipidemia, who is transferred from [facility].   She states that her mother is dying.  She is in [place], being taken care of by her brother.  She suffers from [place], and she occasionally is not able to recognize the patient when she talks on the phone.  She also talks about her husband, who is under cardiac evaluation and memory issues for the past few months.  She states that it has been hard to see him this way, stating that he used to play a football.  She has been very active.  She is joining daughter of the [place] revolution, and other organization for people who has ancestor, who fought in civil war.  She is also heavily involved in church, and  has a Estate agent later today.  She enjoys collecting dolls, and is looking forward to the readings now that she has Kindle.   Depression-  She is concerned about weight gain; she believes she has been doing emotional eating due to the stress related to her husband's medical condition.  She feels down at times.  She denies anhedonia.  She sleeps 9 hours.  She denies SI.  Last time she had depressive episode was in December last year.  It occurred in the context of attending funeral of her friend who was 41 yo.   Mania-she reports she was "supersexed ", had 2.5 days of decreased need for sleep, $3800 of impulsive shopping in the past.  She denies any decreased need for sleep or euphonia lately.  Although she denies AH, she reports VH of seeing something in the corner.   PTSD-she reports lack of nurturing as a child.  She has intrusive thoughts about this.  She denies nightmares or flashback.   Substance-she denies any alcohol use or drug use.   Medication- lithium 300 mg daily, venlafaxine 225 mg daily, quetiapine 100 mg at night, trazodone 300 mg at night, Buspar 15 mg three times a day, gabapentin 300 mg three times a day,   Daily routine: sit on the porch with her husband Exercise: Employment: retired in 2008, used to work as Administrator Support: Household: husband Marital status: married for 50 years in 2022 Number of children: 2 (1 son and  1 daughter in [place]) She grew up in [place].  She reports lack of nurturing as a child.  Her father died from MI when young. Her mother was driving with her girlfriend, being around with married man while Tiffiney was sitting in the back of the car. Had good relationship with her grandmother  Visit Diagnosis:    ICD-10-CM   1. Bipolar 1 disorder, depressed, partial remission (HCC)  F31.75 gabapentin (NEURONTIN) 300 MG capsule    lithium 300 MG tablet    venlafaxine XR (EFFEXOR XR) 75 MG 24 hr capsule    2. Anxiety  F41.9 busPIRone  (BUSPAR) 15 MG tablet    gabapentin (NEURONTIN) 300 MG capsule    venlafaxine XR (EFFEXOR XR) 75 MG 24 hr capsule    3. Insomnia, unspecified type  G47.00       Past Psychiatric History:  Outpatient:  Psychiatry admission: [facility] for SI in her 47's,  Previous suicide attempt: overdosed aspirin, cut wrist in teenager, put cords around shower while in the hospital, last attempt ten years ago Past trials of medication: Abilify,  History of violence:       Past Medical History:  Past Medical History:  Diagnosis Date   Anxiety    Bipolar disorder ([place])    CKD (chronic kidney disease) stage 3, GFR 30-59 ml/min (HCC)    Complication of anesthesia    Depression    GERD (gastroesophageal reflux disease)    occasionally has to use tums   Headache    History of diverticulosis    Hyperlipidemia    Macular degeneration    Overactive bladder    PONV (postoperative nausea and vomiting)    Substance abuse ([place])    alcohol    Past Surgical History:  Procedure Laterality Date   COLONOSCOPY WITH PROPOFOL N/A 10/07/2017   Procedure: COLONOSCOPY WITH PROPOFOL;  Surgeon: [name], MD;  Location: [facility];  Service: Endoscopy;  Laterality: N/A;   FOOT SURGERY Bilateral    KNEE SURGERY Left    POLYPECTOMY  10/07/2017   Procedure: POLYPECTOMY;  Surgeon: [name], MD;  Location: [facility];  Service: Endoscopy;;   TONSILLECTOMY AND ADENOIDECTOMY      Family Psychiatric History: as below  Family History:  Family History  Problem Relation Age of Onset   Alzheimer's disease Mother    Bipolar disorder Mother    Depression Mother    Heart attack Father    Diabetes Brother    Obesity Brother    Depression Maternal Grandfather    Depression Daughter    Stroke Maternal Grandmother    Stroke Paternal Grandmother    Heart attack Paternal Grandfather    Breast cancer Neg Hx     Social History:  Social History   Socioeconomic History   Marital  status: Married    Spouse name: Not on file   Number of children: 2   Years of education: Not on file   Highest education level: Bachelor's degree (e.g., BA, AB, BS)  Occupational History   Occupation: retired   Tobacco Use   Smoking status: Former    Pack years: 0.00    Types: Cigarettes    Start date: 07/09/1967    Quit date: 07/08/1985    Years since quitting: 35.7   Smokeless tobacco: Never  Vaping Use   Vaping Use: Never used  Substance and Sexual Activity   Alcohol use: Yes    Alcohol/week: 3.0 standard drinks    Types: 3 Shots  of liquor per week    Comment: occassional   Drug use: No   Sexual activity: Not Currently  Other Topics Concern   Not on file  Social History Narrative   Not on file   Social Determinants of Health   Financial Resource Strain: Low Risk    Difficulty of Paying Living Expenses: Not hard at all  Food Insecurity: No Food Insecurity   Worried About Charity fundraiser in the Last Year: Never true   [place] in the Last Year: Never true  Transportation Needs: No Transportation Needs   Lack of Transportation (Medical): No   Lack of Transportation (Non-Medical): No  Physical Activity: Inactive   Days of Exercise per Week: 0 days   Minutes of Exercise per Session: 0 min  Stress: Stress Concern Present   Feeling of Stress : Very much  Social Connections: Not on file    Allergies:  Allergies  Allergen Reactions   Ace Inhibitors Other (See Comments)   Fluoxetine Other (See Comments)   Hydrocodone-Chlorpheniramine Other (See Comments)   Paroxetine Hcl Other (See Comments)   Penicillin G Benzathine Other (See Comments)   Penicillin V Potassium Other (See Comments)   Clindamycin Hcl Rash and Other (See Comments)    Katherina Right' syndrome   Lamotrigine Rash    Metabolic Disorder Labs: Lab Results  Component Value Date   HGBA1C 4.9 08/17/2017   No results found for: PROLACTIN Lab Results  Component Value Date   CHOL 191  02/04/2021   TRIG 193 (H) 02/04/2021   HDL 64 02/04/2021   CHOLHDL 3.0 02/04/2021   VLDL 39 (H) 08/17/2017   LDLCALC 94 02/04/2021   LDLCALC 94 04/01/2020   Lab Results  Component Value Date   TSH 3.030 02/04/2021   TSH 3.080 02/23/2019    Therapeutic Level Labs: Lab Results  Component Value Date   LITHIUM 0.72 04/22/2020   LITHIUM 0.74 11/01/2018   No results found for: VALPROATE No components found for:  CBMZ  Current Medications: Current Outpatient Medications  Medication Sig Dispense Refill   lurasidone (LATUDA) 20 MG TABS tablet Take 1 tablet (20 mg total) by mouth daily with breakfast. 30 tablet 0   atorvastatin (LIPITOR) 20 MG tablet Take 1 tablet (20 mg total) by mouth daily. 90 tablet 4   busPIRone (BUSPAR) 15 MG tablet Take 1 tablet (15 mg total) by mouth 3 (three) times daily. 270 tablet 0   Calcium Citrate-Vitamin D (CALCIUM + D PO) Take 1 tablet by mouth 2 (two) times daily.      fexofenadine (ALLERGY RELIEF) 180 MG tablet TAKE 1 TABLET(180 MG) BY MOUTH DAILY 90 tablet 4   fluticasone (FLONASE) 50 MCG/ACT nasal spray SHAKE LIQUID AND USE 2 SPRAYS IN EACH NOSTRIL DAILY 16 g 2   gabapentin (NEURONTIN) 300 MG capsule Take 1 capsule (300 mg total) by mouth 3 (three) times daily. 270 capsule 0   lithium 300 MG tablet Take 1 tablet (300 mg total) by mouth in the morning and at bedtime. 180 tablet 0   metoprolol succinate (TOPROL-XL) 25 MG 24 hr tablet Take 1 tablet (25 mg total) by mouth daily. 90 tablet 4   montelukast (SINGULAIR) 10 MG tablet TAKE 1 TABLET(10 MG) BY MOUTH AT BEDTIME 90 tablet 1   Multiple Vitamin (MULTIVITAMIN) tablet Take 1 tablet by mouth daily.     Omega-3 Fatty Acids (FISH OIL) 1000 MG CAPS Take 1,000 mg by mouth 2 (two) times daily.  omeprazole (PRILOSEC) 40 MG capsule Take 1 capsule (40 mg total) by mouth daily. 90 capsule 4   oxybutynin (DITROPAN-XL) 10 MG 24 hr tablet Take 2 tablets (20 mg total) by mouth at bedtime. 180 tablet 4    trazodone (DESYREL) 300 MG tablet TAKE 1 TABLET (300 MG TOTAL) BY MOUTH AT BEDTIME. 90 tablet 0   triamcinolone (KENALOG) 0.1 % Apply 1 application topically 2 (two) times daily. 45 each 0   trospium (SANCTURA) 20 MG tablet      valACYclovir (VALTREX) 1000 MG tablet Take 1 tablet (1,000 mg total) by mouth 2 (two) times daily. 20 tablet 0   venlafaxine XR (EFFEXOR XR) 75 MG 24 hr capsule Take 3 capsules daily 270 capsule 0   No current facility-administered medications for this visit.     Musculoskeletal: Strength & Muscle Tone:  N/A Gait & Station:  N/A Patient leans: N/A  Psychiatric Specialty Exam: Review of Systems  Psychiatric/Behavioral:  Positive for dysphoric mood, hallucinations and sleep disturbance. Negative for agitation, behavioral problems, confusion, decreased concentration, self-injury and suicidal ideas. The patient is not nervous/anxious and is not hyperactive.   All other systems reviewed and are negative.  There were no vitals taken for this visit.There is no height or weight on file to calculate BMI.  General Appearance: Fairly Groomed  Eye Contact:  Good  Speech:  Clear and Coherent  Volume:  Normal  Mood:   fine  Affect:  Appropriate, Congruent, Tearful, and reactive  Thought Process:  Coherent  Orientation:  Full (Time, Place, and Person)  Thought Content: WDL   Suicidal Thoughts:  No  Homicidal Thoughts:  No  Memory:  Immediate;   Good  Judgement:  Good  Insight:  Good  Psychomotor Activity:  Normal  Concentration:  Concentration: Good and Attention Span: Good  Recall:  Good  Fund of Knowledge: Good  Language: Good  Akathisia:  No  Handed:  Right  AIMS (if indicated): not done  Assets:  Communication Skills Desire for Improvement  ADL's:  Intact  Cognition: WNL  Sleep:  Poor   Screenings: GAD-7    Flowsheet Row Office Visit from 11/15/2020 in [facility] Visit from 04/01/2020 in [facility] Visit from  02/23/2019 in [facility]  Total GAD-7 Score 15 18 14       PHQ2-9    Flowsheet Row Video Visit from 03/25/2021 in [facility] Office Visit from 02/18/2021 in [facility] Visit from 02/04/2021 in [facility] Visit from 01/08/2021 in [facility] Visit from 12/11/2020 in [facility]  PHQ-2 Total Score 1 0 0 0 0  PHQ-9 Total Score -- 0 -- -- --      Flowsheet Row Video Visit from 03/25/2021 in [facility] No Risk        Assessment and Plan:  Elizabeth Malone is a 70 y.o. year old female with a history of bipolar I disorder, history of stage III CKD, hypertension, GERD, PONV, hyperlipidemia, who is transferred from [facility].    2. Bipolar 1 disorder, depressed, partial remission ([place]) 1. Anxiety She denies significant mood symptoms except occasional depression in the context of her mother suffering from dementia, and her husband going through cardiac evaluation.  She is heavily involved with many activities, which includes church, and reports good relationship with her husband.  Will switch from quetiapine to [facility] given her history of binge  eating/weight gain.  Will continue lithium to target bipolar disorder.  Will obtain labs given it has not been done for the past several months to monitor any side effect and adjust the dose as needed.  Will continue venlafaxine to target depression and anxiety.  Will continue BuSpar and gabapentin to target anxiety.   # Insomnia She reports fair benefit from trazodone.  Will continue the current dose at this time for insomnia.   # Chart diagnoses of substance use According to the chart review, she has a diagnosis of substance/alcohol use.  When she was explored on this, she adamantly denies any alcohol abuse or drug use, and she states that information may have been mixed with other patient.    #Stage III CKD It was reportedly resolved since discontinuation of ACI. Will plan to obtain record from her PCP at the next visit.   Plan Continue lithium 300 mg twice a day Continue venlafaxine 225 mg daily Discontinue quetiapine/was on 100 mg at night Start Latuda 20 mg daily Continue Buspar 15 mg three times a day, Continue gabapentin 300 mg three times a day Continue trazodone 300 mg at night as needed for sleep Next appointment- 8/10 at 8 AM for 30 mins video - She declined this clinician to obtain blood test, but is agreeable to do it with her PCP at the visit tomorrow.  She is advised to check lithium, TSH, BMP given it has not been done for the past several months.    The patient demonstrates the following risk factors for suicide: Chronic risk factors for suicide include: psychiatric disorder of bipolar disorder and previous suicide attempts of cutting, putting cord around her neck, overdose on medication . Acute risk factors for suicide include: unemployment. Protective factors for this patient include: positive social support, coping skills, and hope for the future. Considering these factors, the overall suicide risk at this point appears to be low. Patient is appropriate for outpatient follow up.        [name], MD 03/25/2021, 9:52 AM

## 2021-03-24 ENCOUNTER — Telehealth: Payer: Medicare PPO | Admitting: Psychiatry

## 2021-03-25 ENCOUNTER — Telehealth (INDEPENDENT_AMBULATORY_CARE_PROVIDER_SITE_OTHER): Payer: Medicare PPO | Admitting: Psychiatry

## 2021-03-25 ENCOUNTER — Encounter: Payer: Self-pay | Admitting: Psychiatry

## 2021-03-25 ENCOUNTER — Other Ambulatory Visit: Payer: Self-pay

## 2021-03-25 DIAGNOSIS — F3175 Bipolar disorder, in partial remission, most recent episode depressed: Secondary | ICD-10-CM | POA: Diagnosis not present

## 2021-03-25 DIAGNOSIS — G4700 Insomnia, unspecified: Secondary | ICD-10-CM

## 2021-03-25 DIAGNOSIS — F419 Anxiety disorder, unspecified: Secondary | ICD-10-CM

## 2021-03-25 MED ORDER — LITHIUM CARBONATE 300 MG PO TABS: 300.0000 mg | ORAL_TABLET | Freq: Two times a day (BID) | ORAL | 0 refills | Status: DC

## 2021-03-25 MED ORDER — GABAPENTIN 300 MG PO CAPS: 300.0000 mg | ORAL_CAPSULE | Freq: Three times a day (TID) | ORAL | 0 refills | Status: DC

## 2021-03-25 MED ORDER — BUSPIRONE HCL 15 MG PO TABS: 15.0000 mg | ORAL_TABLET | Freq: Three times a day (TID) | ORAL | 0 refills | Status: DC

## 2021-03-25 MED ORDER — LURASIDONE HCL 20 MG PO TABS: 20.0000 mg | ORAL_TABLET | Freq: Every day | ORAL | 0 refills | Status: DC

## 2021-03-25 MED ORDER — VENLAFAXINE HCL ER 75 MG PO CP24: ORAL_CAPSULE | ORAL | 0 refills | Status: DC

## 2021-03-25 NOTE — Patient Instructions (Signed)
Continue lithium 300 mg twice a day Continue venlafaxine 225 mg daily Discontinue quetiapine Start Latuda 20 mg daily Continue buspar 15 mg three times a day, Continue gabapentin 300 mg three times a day Continue trazodone 300 mg at night as needed for sleep Next appointment- 8/10 at 8 AM Please obtain labs- lithium, BMP, TSH at your primary care visit

## 2021-03-26 ENCOUNTER — Telehealth: Payer: Self-pay

## 2021-03-26 ENCOUNTER — Encounter: Payer: Self-pay | Admitting: Internal Medicine

## 2021-03-26 ENCOUNTER — Ambulatory Visit: Payer: Medicare PPO | Admitting: Internal Medicine

## 2021-03-26 VITALS — BP 140/81 | HR 69 | Temp 99.4°F | Ht 64.72 in | Wt 210.0 lb

## 2021-03-26 DIAGNOSIS — M25562 Pain in left knee: Secondary | ICD-10-CM

## 2021-03-26 DIAGNOSIS — G8929 Other chronic pain: Secondary | ICD-10-CM | POA: Diagnosis not present

## 2021-03-26 MED ORDER — TRIAMCINOLONE ACETONIDE 40 MG/ML IJ SUSP
40.0000 mg | Freq: Once | INTRAMUSCULAR | Status: AC
  Administered 2021-03-26: 40 mg via INTRAMUSCULAR

## 2021-03-26 NOTE — Telephone Encounter (Signed)
received fax requesting a prior auth on the latuda 

## 2021-03-26 NOTE — Telephone Encounter (Signed)
submitted prior auth for latuda on [email or web address]-  pending

## 2021-03-27 ENCOUNTER — Telehealth: Payer: Self-pay

## 2021-03-27 ENCOUNTER — Encounter: Payer: Self-pay | Admitting: Internal Medicine

## 2021-03-27 NOTE — Telephone Encounter (Signed)
the latuda 20mg  has been approved until 09-13-21

## 2021-03-27 NOTE — Telephone Encounter (Signed)
Pt was called and given instructions

## 2021-03-27 NOTE — Telephone Encounter (Signed)
pt left a message that she needed to speak with you about her medications.  she states that she thought you told her to stop the lithium and start the latuda. but the lithium was sent into the [facility] and she states she wanted to know if you made any more changes. She very confused about the medications.

## 2021-03-27 NOTE — Telephone Encounter (Signed)
The plan we discussed was to discontinue quetiapine, NOT lithium. Please advise her to continue lithium, and start latuda instead of quetiapine. Let me know if any more questions.

## 2021-03-27 NOTE — Progress Notes (Signed)
BP 140/81   Pulse 69   Temp 99.4 F (37.4 C) (Oral)   Ht 5' 4.72" (1.644 m)   Wt 210 lb (95.3 kg)   SpO2 96%   BMI 35.24 kg/m    Subjective:    Patient ID: Elizabeth Malone, female    DOB: January 09, 1951, 70 y.o.   MRN: [id number]  Chief Complaint  Patient presents with  . Knee Pain    Left knee pain    HPI: Elizabeth Malone is a 70 y.o. female  Knee Pain  The incident occurred more than 1 week ago. There was no injury mechanism. The pain is present in the left knee. The pain is at a severity of 5/10. The pain is moderate. The pain has been Intermittent since onset. Pertinent negatives include no inability to bear weight, loss of motion, loss of sensation, muscle weakness, numbness or tingling. Associated symptoms comments: Decreased ROM + . The treatment provided moderate relief.   Chief Complaint  Patient presents with  . Knee Pain    Left knee pain    Relevant past medical, surgical, family and social history reviewed and updated as indicated. Interim medical history since our last visit reviewed. Allergies and medications reviewed and updated.  Review of Systems  Neurological:  Negative for tingling and numbness.   Per HPI unless specifically indicated above     Objective:    BP 140/81   Pulse 69   Temp 99.4 F (37.4 C) (Oral)   Ht 5' 4.72" (1.644 m)   Wt 210 lb (95.3 kg)   SpO2 96%   BMI 35.24 kg/m   Wt Readings from Last 3 Encounters:  03/26/21 210 lb (95.3 kg)  02/18/21 210 lb (95.3 kg)  02/04/21 212 lb 3.2 oz (96.3 kg)    Physical Exam Constitutional:      Appearance: Normal appearance. She is obese.  Musculoskeletal:        General: Swelling and tenderness present. No signs of injury.     Right lower leg: No edema.     Left lower leg: No edema.  Skin:    General: Skin is warm.     Coloration: Skin is not pale.     Findings: No erythema or rash.  Neurological:     Mental Status: She is alert.  Psychiatric:        Mood and Affect: Mood normal.         Behavior: Behavior normal.   Results for orders placed or performed in visit on 02/18/21  Microscopic Examination   Urine  Result Value Ref Range   WBC, UA 0-5 0 - 5 /hpf   RBC None seen 0 - 2 /hpf   Epithelial Cells (non renal) 0-10 0 - 10 /hpf   Renal Epithel, UA 0-10 (A) None seen /hpf   Bacteria, UA Moderate (A) None seen/Few  Urinalysis, Routine w reflex microscopic  Result Value Ref Range   Specific Gravity, UA 1.020 1.005 - 1.030   pH, UA 7.0 5.0 - 7.5   Color, UA Yellow Yellow   Appearance Ur Cloudy (A) Clear   Leukocytes,UA 3+ (A) Negative   Protein,UA Negative Negative/Trace   Glucose, UA Negative Negative   Ketones, UA Negative Negative   RBC, UA Negative Negative   Bilirubin, UA Negative Negative   Urobilinogen, Ur 0.2 0.2 - 1.0 mg/dL   Nitrite, UA Negative Negative   Microscopic Examination See below:         Current Outpatient  Medications:  .  atorvastatin (LIPITOR) 20 MG tablet, Take 1 tablet (20 mg total) by mouth daily., Disp: 90 tablet, Rfl: 4 .  busPIRone (BUSPAR) 15 MG tablet, Take 1 tablet (15 mg total) by mouth 3 (three) times daily., Disp: 270 tablet, Rfl: 0 .  Calcium Citrate-Vitamin D (CALCIUM + D PO), Take 1 tablet by mouth 2 (two) times daily. , Disp: , Rfl:  .  fexofenadine (ALLERGY RELIEF) 180 MG tablet, TAKE 1 TABLET(180 MG) BY MOUTH DAILY, Disp: 90 tablet, Rfl: 4 .  fluticasone (FLONASE) 50 MCG/ACT nasal spray, SHAKE LIQUID AND USE 2 SPRAYS IN EACH NOSTRIL DAILY, Disp: 16 g, Rfl: 2 .  gabapentin (NEURONTIN) 300 MG capsule, Take 1 capsule (300 mg total) by mouth 3 (three) times daily., Disp: 270 capsule, Rfl: 0 .  lurasidone (LATUDA) 20 MG TABS tablet, Take 1 tablet (20 mg total) by mouth daily with breakfast., Disp: 30 tablet, Rfl: 0 .  metoprolol succinate (TOPROL-XL) 25 MG 24 hr tablet, Take 1 tablet (25 mg total) by mouth daily., Disp: 90 tablet, Rfl: 4 .  montelukast (SINGULAIR) 10 MG tablet, TAKE 1 TABLET(10 MG) BY MOUTH AT BEDTIME,  Disp: 90 tablet, Rfl: 1 .  Multiple Vitamin (MULTIVITAMIN) tablet, Take 1 tablet by mouth daily., Disp: , Rfl:  .  Omega-3 Fatty Acids (FISH OIL) 1000 MG CAPS, Take 1,000 mg by mouth 2 (two) times daily. , Disp: , Rfl:  .  omeprazole (PRILOSEC) 40 MG capsule, Take 1 capsule (40 mg total) by mouth daily., Disp: 90 capsule, Rfl: 4 .  oxybutynin (DITROPAN-XL) 10 MG 24 hr tablet, Take 2 tablets (20 mg total) by mouth at bedtime., Disp: 180 tablet, Rfl: 4 .  trazodone (DESYREL) 300 MG tablet, TAKE 1 TABLET (300 MG TOTAL) BY MOUTH AT BEDTIME., Disp: 90 tablet, Rfl: 0 .  triamcinolone (KENALOG) 0.1 %, Apply 1 application topically 2 (two) times daily., Disp: 45 each, Rfl: 0 .  trospium (SANCTURA) 20 MG tablet, , Disp: , Rfl:  .  valACYclovir (VALTREX) 1000 MG tablet, Take 1 tablet (1,000 mg total) by mouth 2 (two) times daily., Disp: 20 tablet, Rfl: 0 .  venlafaxine XR (EFFEXOR XR) 75 MG 24 hr capsule, Take 3 capsules daily, Disp: 270 capsule, Rfl: 0 .  lithium 300 MG tablet, Take 1 tablet (300 mg total) by mouth in the morning and at bedtime. (Patient not taking: Reported on 03/26/2021), Disp: 180 tablet, Rfl: 0    Assessment & Plan:  Knee injection   The risks, benefits and side effects of treatment were discussed with the patient. After consent was obtained, using sterile technique the left knee was prepped Pt's left knee joint prepped and cleaned with alcohol wipes.   patella was palpated and  pt injected with 1 ml of kenalog and 4 ml of lidocaine under aseptic conditions. no bleeding or post procedural pain noted. Pt felt better after procedure, tolerated procedure well.  The patient was advised to call the office if symptoms worsen or do not improve. The patient is asked to continue to rest the knee for a few more days before resuming regular activities .Call or return to clinic prn if such symptoms occur or the knee fails to improve as anticipated.  Problem List Items Addressed This Visit    None Visit Diagnoses     Chronic pain of left knee    -  Primary   Relevant Medications   triamcinolone acetonide (KENALOG-40) injection 40 mg (Completed)  No orders of the defined types were placed in this encounter.    Meds ordered this encounter  Medications  . triamcinolone acetonide (KENALOG-40) injection 40 mg     Follow up plan: No follow-ups on file.

## 2021-04-01 ENCOUNTER — Other Ambulatory Visit: Payer: Medicare PPO

## 2021-04-03 ENCOUNTER — Ambulatory Visit: Payer: Medicare PPO

## 2021-04-03 ENCOUNTER — Ambulatory Visit: Payer: Medicare PPO | Admitting: Internal Medicine

## 2021-04-03 ENCOUNTER — Telehealth: Payer: Self-pay | Admitting: Psychiatry

## 2021-04-03 DIAGNOSIS — F3176 Bipolar disorder, in full remission, most recent episode depressed: Secondary | ICD-10-CM

## 2021-04-03 NOTE — Telephone Encounter (Signed)
Please contact the patient and advise to obtain blood test for lithium to safely continue this medication. It was not included in the blood test she has done with her PCP. I ordered lab in case she needs the order (she preferred it to be done by her PCP).   Reviewed blood.urine test done on 5/24. CBC wnl, BUN 8, Cre 0.81, TSH 3.03, LFT wnl. UA no protein

## 2021-04-04 ENCOUNTER — Ambulatory Visit: Payer: Medicare PPO

## 2021-04-07 ENCOUNTER — Ambulatory Visit (INDEPENDENT_AMBULATORY_CARE_PROVIDER_SITE_OTHER): Payer: Medicare PPO

## 2021-04-07 VITALS — Ht 64.5 in | Wt 219.0 lb

## 2021-04-07 DIAGNOSIS — Z0000 Encounter for general adult medical examination without abnormal findings: Secondary | ICD-10-CM

## 2021-04-07 NOTE — Progress Notes (Signed)
I connected with Elizabeth Malone today by telephone and verified that I am speaking with the correct person using two identifiers. Location patient: home Location provider: work Persons participating in the virtual visit: Lorey, Benesch LPN.   I discussed the limitations, risks, security and privacy concerns of performing an evaluation and management service by telephone and the availability of in person appointments. I also discussed with the patient that there may be a patient responsible charge related to this service. The patient expressed understanding and verbally consented to this telephonic visit.    Interactive audio and video telecommunications were attempted between this provider and patient, however failed, due to patient having technical difficulties OR patient did not have access to video capability.  We continued and completed visit with audio only.     Vital signs may be patient reported or missing.  Subjective:   Elizabeth Malone is a 70 y.o. female who presents for Medicare Annual (Subsequent) preventive examination.  Review of Systems     Cardiac Risk Factors include: advanced age (>79mn, >>35women);hypertension;obesity (BMI >30kg/m2);sedentary lifestyle     Objective:    Today's Vitals   04/07/21 1026 04/07/21 1028  Weight: 219 lb (99.3 kg)   Height: 5' 4.5" (1.638 m)   PainSc:  5    Body mass index is 37.01 kg/m.  Advanced Directives 04/07/2021 12/13/2020 04/22/2020 04/01/2020 02/09/2019 11/01/2018 02/04/2018  Does Patient Have a Medical Advance Directive? No No No No No No No  Type of Advance Directive - - - - - - -  Does patient want to make changes to medical advance directive? - - - - - - -  Would patient like information on creating a medical advance directive? - Yes (MAU/Ambulatory/Procedural Areas - Information given) No - Patient declined - - No - Patient declined Yes (MAU/Ambulatory/Procedural Areas - Information given)  Some encounter information is  confidential and restricted. Go to Review Flowsheets activity to see all data.    Current Medications (verified) Outpatient Encounter Medications as of 04/07/2021  Medication Sig   atorvastatin (LIPITOR) 20 MG tablet Take 1 tablet (20 mg total) by mouth daily.   busPIRone (BUSPAR) 15 MG tablet Take 1 tablet (15 mg total) by mouth 3 (three) times daily.   fexofenadine (ALLERGY RELIEF) 180 MG tablet TAKE 1 TABLET(180 MG) BY MOUTH DAILY   fluticasone (FLONASE) 50 MCG/ACT nasal spray SHAKE LIQUID AND USE 2 SPRAYS IN EACH NOSTRIL DAILY   gabapentin (NEURONTIN) 300 MG capsule Take 1 capsule (300 mg total) by mouth 3 (three) times daily.   lithium 300 MG tablet Take 1 tablet (300 mg total) by mouth in the morning and at bedtime.   metoprolol succinate (TOPROL-XL) 25 MG 24 hr tablet Take 1 tablet (25 mg total) by mouth daily.   montelukast (SINGULAIR) 10 MG tablet TAKE 1 TABLET(10 MG) BY MOUTH AT BEDTIME   omeprazole (PRILOSEC) 40 MG capsule Take 1 capsule (40 mg total) by mouth daily.   oxybutynin (DITROPAN-XL) 10 MG 24 hr tablet Take 2 tablets (20 mg total) by mouth at bedtime.   QUEtiapine (SEROQUEL) 100 MG tablet Take 100 mg by mouth at bedtime.   trazodone (DESYREL) 300 MG tablet TAKE 1 TABLET (300 MG TOTAL) BY MOUTH AT BEDTIME.   triamcinolone (KENALOG) 0.1 % Apply 1 application topically 2 (two) times daily.   venlafaxine XR (EFFEXOR XR) 75 MG 24 hr capsule Take 3 capsules daily   Calcium Citrate-Vitamin D (CALCIUM + D PO) Take 1 tablet  by mouth 2 (two) times daily.  (Patient not taking: Reported on 04/07/2021)   lurasidone (LATUDA) 20 MG TABS tablet Take 1 tablet (20 mg total) by mouth daily with breakfast. (Patient not taking: Reported on 04/07/2021)   Multiple Vitamin (MULTIVITAMIN) tablet Take 1 tablet by mouth daily. (Patient not taking: Reported on 04/07/2021)   Omega-3 Fatty Acids (FISH OIL) 1000 MG CAPS Take 1,000 mg by mouth 2 (two) times daily.  (Patient not taking: Reported on 04/07/2021)    trospium (SANCTURA) 20 MG tablet  (Patient not taking: Reported on 04/07/2021)   valACYclovir (VALTREX) 1000 MG tablet Take 1 tablet (1,000 mg total) by mouth 2 (two) times daily. (Patient not taking: Reported on 04/07/2021)   No facility-administered encounter medications on file as of 04/07/2021.    Allergies (verified) Ace inhibitors, Fluoxetine, Hydrocodone-chlorpheniramine, Paroxetine hcl, Penicillin g benzathine, Penicillin v potassium, Clindamycin hcl, and Lamotrigine   History: Past Medical History:  Diagnosis Date   Anxiety    Bipolar disorder (HCC)    CKD (chronic kidney disease) stage 3, GFR 30-59 ml/min (HCC)    Complication of anesthesia    Depression    GERD (gastroesophageal reflux disease)    occasionally has to use tums   Headache    History of diverticulosis    Hyperlipidemia    Macular degeneration    Overactive bladder    PONV (postoperative nausea and vomiting)    Past Surgical History:  Procedure Laterality Date   COLONOSCOPY WITH PROPOFOL N/A 10/07/2017   Procedure: COLONOSCOPY WITH PROPOFOL;  Surgeon: [name], MD;  Location: [place];  Service: Endoscopy;  Laterality: N/A;   FOOT SURGERY Bilateral    KNEE SURGERY Left    POLYPECTOMY  10/07/2017   Procedure: POLYPECTOMY;  Surgeon: [name], MD;  Location: [place];  Service: Endoscopy;;   TONSILLECTOMY AND ADENOIDECTOMY     Family History  Problem Relation Age of Onset   Alzheimer's disease Mother    Bipolar disorder Mother    Depression Mother    Heart attack Father    Diabetes Brother    Obesity Brother    Depression Maternal Grandfather    Depression Daughter    Stroke Maternal Grandmother    Stroke Paternal Grandmother    Heart attack Paternal Grandfather    Breast cancer Neg Hx    Social History   Socioeconomic History   Marital status: Married    Spouse name: Not on file   Number of children: 2   Years of education: Not on file   Highest education  level: Bachelor's degree (e.g., BA, AB, BS)  Occupational History   Occupation: retired   Tobacco Use   Smoking status: Former    Types: Cigarettes    Start date: 07/09/1967    Quit date: 07/08/1985    Years since quitting: 35.7   Smokeless tobacco: Never  Vaping Use   Vaping Use: Never used  Substance and Sexual Activity   Alcohol use: Yes    Alcohol/week: 3.0 standard drinks    Types: 3 Shots of liquor per week    Comment: occassional   Drug use: No   Sexual activity: Not Currently  Other Topics Concern   Not on file  Social History Narrative   Not on file   Social Determinants of Health   Financial Resource Strain: Low Risk    Difficulty of Paying Living Expenses: Not hard at all  Food Insecurity: No Food Insecurity   Worried About Running Out  of Food in the Last Year: Never true   [place] in the Last Year: Never true  Transportation Needs: No Transportation Needs   Lack of Transportation (Medical): No   Lack of Transportation (Non-Medical): No  Physical Activity: Inactive   Days of Exercise per Week: 0 days   Minutes of Exercise per Session: 0 min  Stress: No Stress Concern Present   Feeling of Stress : Not at all  Social Connections: Not on file    Tobacco Counseling Counseling given: Not Answered   Clinical Intake:  Pre-visit preparation completed: Yes  Pain : 0-10 Pain Score: 5  Pain Type: Chronic pain Pain Location: Shoulder Pain Orientation: Right, Left Pain Descriptors / Indicators: Aching Pain Onset: More than a month ago Pain Frequency: Constant     Nutritional Status: BMI > 30  Obese Nutritional Risks: Nausea/ vomitting/ diarrhea (diarrhea and nausea when at beach, resolved) Diabetes: No  How often do you need to have someone help you when you read instructions, pamphlets, or other written materials from your doctor or pharmacy?: 1 - Never What is the last grade level you completed in school?: college  Diabetic?  no  Interpreter Needed?: No  Information entered by :: NAllen LPN   Activities of Daily Living In your present state of health, do you have any difficulty performing the following activities: 04/07/2021  Hearing? N  Vision? N  Difficulty concentrating or making decisions? N  Walking or climbing stairs? Y  Doing errands, shopping? N  Preparing Food and eating ? N  Using the Toilet? N  In the past six months, have you accidently leaked urine? Y  Do you have problems with loss of bowel control? Y  Managing your Medications? N  Managing your Finances? N  Housekeeping or managing your Housekeeping? N  Some recent data might be hidden    Patient Care Team: Charlynne Cousins, MD as PCP - General  Indicate any recent Medical Services you may have received from other than [facility] providers in the past year (date may be approximate).     Assessment:   This is a routine wellness examination for Klara.  Hearing/Vision screen Vision Screening - Comments:: Regular eye exams, [facility]  Dietary issues and exercise activities discussed: Current Exercise Habits: The patient does not participate in regular exercise at present   Goals Addressed             This Visit's Progress    Patient Stated       04/07/2021, start exercising       Depression Screen PHQ 2/9 Scores 04/07/2021 02/18/2021 02/18/2021 02/04/2021 01/08/2021 12/11/2020 11/27/2020  PHQ - 2 Score 3 0 0 0 0 0 0  PHQ- 9 Score 4 0 - - - - 0  Exception Documentation - - - - - - -  Not completed - - - - - - -  Some encounter information is confidential and restricted. Go to Review Flowsheets activity to see all data.    Fall Risk Fall Risk  04/07/2021 02/18/2021 02/04/2021 01/08/2021 12/11/2020  Falls in the past year? 0 0 0 0 0  Number falls in past yr: - 0 0 - -  Injury with Fall? - 0 0 - 0  Risk Factor Category  - - - - -  Risk for fall due to : Medication side effect No Fall Risks - - No Fall Risks  Follow up Falls  evaluation completed;Education provided;Falls prevention discussed Falls evaluation completed Falls  evaluation completed - -    FALL RISK PREVENTION PERTAINING TO THE HOME:  Any stairs in or around the home? Yes  If so, are there any without handrails? No  Home free of loose throw rugs in walkways, pet beds, electrical cords, etc? Yes  Adequate lighting in your home to reduce risk of falls? No   ASSISTIVE DEVICES UTILIZED TO PREVENT FALLS:  Life alert? No  Use of a cane, walker or w/c? No  Grab bars in the bathroom? Yes  Shower chair or bench in shower? Yes  Elevated toilet seat or a handicapped toilet? No   TIMED UP AND GO:  Was the test performed? No .       Cognitive Function:     6CIT Screen 04/07/2021 04/01/2020 02/04/2018  What Year? 0 points 0 points 0 points  What month? 0 points 0 points 0 points  What time? 0 points 0 points 0 points  Count back from 20 0 points 0 points 0 points  Months in reverse 0 points 0 points 0 points  Repeat phrase 2 points 2 points 0 points  Total Score 2 2 0    Immunizations Immunization History  Administered Date(s) Administered   Fluad Quad(high Dose 65+) 06/15/2019   Influenza, High Dose Seasonal PF 06/06/2018   Influenza-Unspecified 06/05/2015, 06/04/2016, 06/28/2017, 06/11/2020   PFIZER(Purple Top)SARS-COV-2 Vaccination 10/24/2019, 11/14/2019, 07/04/2020   Pneumococcal Conjugate-13 10/20/2016   Pneumococcal Polysaccharide-23 03/28/2018   Tdap 03/23/2013   Zoster Recombinat (Shingrix) 02/19/2017, 05/10/2017   Zoster, Live 01/13/2011    TDAP status: Up to date  Flu Vaccine status: Up to date  Pneumococcal vaccine status: Up to date  Covid-19 vaccine status: Completed vaccines  Qualifies for Shingles Vaccine? Yes   Zostavax completed Yes   Shingrix Completed?: Yes  Screening Tests Health Maintenance  Topic Date Due   COVID-19 Vaccine (4 - Booster for [place] series) 10/04/2020   INFLUENZA VACCINE  04/14/2021    MAMMOGRAM  12/26/2021   COLONOSCOPY (Pts 45-56yr Insurance coverage will need to be confirmed)  10/07/2022   TETANUS/TDAP  03/24/2023   DEXA SCAN  Completed   Hepatitis C Screening  Completed   PNA vac Low Risk Adult  Completed   Zoster Vaccines- Shingrix  Completed   HPV VACCINES  Aged Out    Health Maintenance  Health Maintenance Due  Topic Date Due   COVID-19 Vaccine (4 - Booster for [place]) 10/04/2020    Colorectal cancer screening: Type of screening: Colonoscopy. Completed 10/07/2017. Repeat every 5 years  Mammogram status: Completed 12/27/2019. Repeat every 2 years  Bone Density status: Completed 10/27/2018.  Lung Cancer Screening: (Low Dose CT Chest recommended if Age 70-80years, 30 pack-year currently smoking OR have quit w/in 15years.) does not qualify.   Lung Cancer Screening Referral: no  Additional Screening:  Hepatitis C Screening: does qualify; Completed 02/04/2016  Vision Screening: Recommended annual ophthalmology exams for early detection of glaucoma and other disorders of the eye. Is the patient up to date with their annual eye exam?  Yes  Who is the provider or what is the name of the [facility] which the patient attends annual [facility] exams[facility] is not [facility] with a provider, would they like to be referred to a provider to establish care? No .   Dental Screening: Recommended annual dental exams for proper oral hygiene  Community Resource Referral / Chronic Care Management: CRR required this visit?  No   CCM required this  visit?  No      Plan:     I have personally reviewed and noted the following in the patient's chart:   Medical and social history Use of alcohol, tobacco or illicit drugs  Current medications and supplements including opioid prescriptions.  Functional ability and status Nutritional status Physical activity Advanced directives List of other physicians Hospitalizations, surgeries, and ER visits in  previous 12 months Vitals Screenings to include cognitive, depression, and falls Referrals and appointments  In addition, I have reviewed and discussed with patient certain preventive protocols, quality metrics, and best practice recommendations. A written personalized care plan for preventive services as well as general preventive health recommendations were provided to patient.     Kellie Simmering, LPN   [id number]   Nurse Notes:

## 2021-04-07 NOTE — Patient Instructions (Signed)
Ms. Elizabeth Malone , Thank you for taking time to come for your Medicare Wellness Visit. I appreciate your ongoing commitment to your health goals. Please review the following plan we discussed and let me know if I can assist you in the future.   Screening recommendations/referrals: Colonoscopy: completed 10/07/2017, due 10/07/2022 Mammogram: completed 12/27/2019 Bone Density: completed 10/27/2018 Recommended yearly ophthalmology/optometry visit for glaucoma screening and checkup Recommended yearly dental visit for hygiene and checkup  Vaccinations: Influenza vaccine: completed 06/11/2020, due 04/14/2021 Pneumococcal vaccine: completed 03/28/2018 Tdap vaccine: completed 03/23/2013, due 03/24/2023 Shingles vaccine: completed   Covid-19: 07/04/2020, 11/14/2019, 10/24/2019  Advanced directives: Advance directive discussed with you today.   Conditions/risks identified: noone  Next appointment: Follow up in one year for your annual wellness visit    Preventive Care 65 Years and Older, Female Preventive care refers to lifestyle choices and visits with your health care provider that can promote health and wellness. What does preventive care include? A yearly physical exam. This is also called an annual well check. Dental exams once or twice a year. Routine eye exams. Ask your health care provider how often you should have your eyes checked. Personal lifestyle choices, including: Daily care of your teeth and gums. Regular physical activity. Eating a healthy diet. Avoiding tobacco and drug use. Limiting alcohol use. Practicing safe sex. Taking low-dose aspirin every day. Taking vitamin and mineral supplements as recommended by your health care provider. What happens during an annual well check? The services and screenings done by your health care provider during your annual well check will depend on your age, overall health, lifestyle risk factors, and family history of disease. Counseling  Your health care  provider may ask you questions about your: Alcohol use. Tobacco use. Drug use. Emotional well-being. Home and relationship well-being. Sexual activity. Eating habits. History of falls. Memory and ability to understand (cognition). Work and work Statistician. Reproductive health. Screening  You may have the following tests or measurements: Height, weight, and BMI. Blood pressure. Lipid and cholesterol levels. These may be checked every 5 years, or more frequently if you are over 37 years old. Skin check. Lung cancer screening. You may have this screening every year starting at age 50 if you have a 30-pack-year history of smoking and currently smoke or have quit within the past 15 years. Fecal occult blood test (FOBT) of the stool. You may have this test every year starting at age 77. Flexible sigmoidoscopy or colonoscopy. You may have a sigmoidoscopy every 5 years or a colonoscopy every 10 years starting at age 68. Hepatitis C blood test. Hepatitis B blood test. Sexually transmitted disease (STD) testing. Diabetes screening. This is done by checking your blood sugar (glucose) after you have not eaten for a while (fasting). You may have this done every 1-3 years. Bone density scan. This is done to screen for osteoporosis. You may have this done starting at age 4. Mammogram. This may be done every 1-2 years. Talk to your health care provider about how often you should have regular mammograms. Talk with your health care provider about your test results, treatment options, and if necessary, the need for more tests. Vaccines  Your health care provider may recommend certain vaccines, such as: Influenza vaccine. This is recommended every year. Tetanus, diphtheria, and acellular pertussis (Tdap, Td) vaccine. You may need a Td booster every 10 years. Zoster vaccine. You may need this after age 39. Pneumococcal 13-valent conjugate (PCV13) vaccine. One dose is recommended after age  2. Pneumococcal  polysaccharide (PPSV23) vaccine. One dose is recommended after age 58. Talk to your health care provider about which screenings and vaccines you need and how often you need them. This information is not intended to replace advice given to you by your health care provider. Make sure you discuss any questions you have with your health care provider. Document Released: 09/27/2015 Document Revised: 05/20/2016 Document Reviewed: 07/02/2015 Elsevier Interactive Patient Education  2017 [place] Prevention in the Home Falls can cause injuries. They can happen to people of all ages. There are many things you can do to make your home safe and to help prevent falls. What can I do on the outside of my home? Regularly fix the edges of walkways and driveways and fix any cracks. Remove anything that might make you trip as you walk through a door, such as a raised step or threshold. Trim any bushes or trees on the path to your home. Use bright outdoor lighting. Clear any walking paths of anything that might make someone trip, such as rocks or tools. Regularly check to see if handrails are loose or broken. Make sure that both sides of any steps have handrails. Any raised decks and porches should have guardrails on the edges. Have any leaves, snow, or ice cleared regularly. Use sand or salt on walking paths during winter. Clean up any spills in your garage right away. This includes oil or grease spills. What can I do in the bathroom? Use night lights. Install grab bars by the toilet and in the tub and shower. Do not use towel bars as grab bars. Use non-skid mats or decals in the tub or shower. If you need to sit down in the shower, use a plastic, non-slip stool. Keep the floor dry. Clean up any water that spills on the floor as soon as it happens. Remove soap buildup in the tub or shower regularly. Attach bath mats securely with double-sided non-slip rug tape. Do not have throw  rugs and other things on the floor that can make you trip. What can I do in the bedroom? Use night lights. Make sure that you have a light by your bed that is easy to reach. Do not use any sheets or blankets that are too big for your bed. They should not hang down onto the floor. Have a firm chair that has side arms. You can use this for support while you get dressed. Do not have throw rugs and other things on the floor that can make you trip. What can I do in the kitchen? Clean up any spills right away. Avoid walking on wet floors. Keep items that you use a lot in easy-to-reach places. If you need to reach something above you, use a strong step stool that has a grab bar. Keep electrical cords out of the way. Do not use floor polish or wax that makes floors slippery. If you must use wax, use non-skid floor wax. Do not have throw rugs and other things on the floor that can make you trip. What can I do with my stairs? Do not leave any items on the stairs. Make sure that there are handrails on both sides of the stairs and use them. Fix handrails that are broken or loose. Make sure that handrails are as long as the stairways. Check any carpeting to make sure that it is firmly attached to the stairs. Fix any carpet that is loose or worn. Avoid having throw rugs at the top or  bottom of the stairs. If you do have throw rugs, attach them to the floor with carpet tape. Make sure that you have a light switch at the top of the stairs and the bottom of the stairs. If you do not have them, ask someone to add them for you. What else can I do to help prevent falls? Wear shoes that: Do not have high heels. Have rubber bottoms. Are comfortable and fit you well. Are closed at the toe. Do not wear sandals. If you use a stepladder: Make sure that it is fully opened. Do not climb a closed stepladder. Make sure that both sides of the stepladder are locked into place. Ask someone to hold it for you, if  possible. Clearly mark and make sure that you can see: Any grab bars or handrails. First and last steps. Where the edge of each step is. Use tools that help you move around (mobility aids) if they are needed. These include: Canes. Walkers. Scooters. Crutches. Turn on the lights when you go into a dark area. Replace any light bulbs as soon as they burn out. Set up your furniture so you have a clear path. Avoid moving your furniture around. If any of your floors are uneven, fix them. If there are any pets around you, be aware of where they are. Review your medicines with your doctor. Some medicines can make you feel dizzy. This can increase your chance of falling. Ask your doctor what other things that you can do to help prevent falls. This information is not intended to replace advice given to you by your health care provider. Make sure you discuss any questions you have with your health care provider. Document Released: 06/27/2009 Document Revised: 02/06/2016 Document Reviewed: 10/05/2014 Elsevier Interactive Patient Education  2017 [employer].

## 2021-04-08 ENCOUNTER — Other Ambulatory Visit (HOSPITAL_COMMUNITY): Payer: Self-pay | Admitting: Psychiatry

## 2021-04-08 ENCOUNTER — Ambulatory Visit: Payer: Medicare PPO | Admitting: Internal Medicine

## 2021-04-08 NOTE — Telephone Encounter (Signed)
Called and left another message for patient to call office back that she still needed to get her lithium levels done.

## 2021-04-09 ENCOUNTER — Other Ambulatory Visit: Payer: Self-pay

## 2021-04-09 DIAGNOSIS — E7800 Pure hypercholesterolemia, unspecified: Secondary | ICD-10-CM

## 2021-04-09 MED ORDER — ATORVASTATIN CALCIUM 20 MG PO TABS: 20.0000 mg | ORAL_TABLET | Freq: Every day | ORAL | 4 refills | Status: DC

## 2021-04-11 ENCOUNTER — Other Ambulatory Visit: Payer: Self-pay

## 2021-04-11 ENCOUNTER — Ambulatory Visit: Payer: Medicare PPO | Admitting: Internal Medicine

## 2021-04-11 NOTE — Telephone Encounter (Signed)
  Error   i

## 2021-04-16 ENCOUNTER — Encounter: Payer: Self-pay | Admitting: Internal Medicine

## 2021-04-16 ENCOUNTER — Other Ambulatory Visit: Payer: Self-pay

## 2021-04-16 ENCOUNTER — Ambulatory Visit: Payer: Medicare PPO | Admitting: Internal Medicine

## 2021-04-16 VITALS — BP 131/80 | HR 65 | Temp 98.8°F | Ht 64.57 in | Wt 215.2 lb

## 2021-04-16 DIAGNOSIS — Z6836 Body mass index (BMI) 36.0-36.9, adult: Secondary | ICD-10-CM | POA: Diagnosis not present

## 2021-04-16 DIAGNOSIS — M25512 Pain in left shoulder: Secondary | ICD-10-CM | POA: Diagnosis not present

## 2021-04-16 DIAGNOSIS — I10 Essential (primary) hypertension: Secondary | ICD-10-CM | POA: Diagnosis not present

## 2021-04-16 DIAGNOSIS — G8929 Other chronic pain: Secondary | ICD-10-CM | POA: Diagnosis not present

## 2021-04-16 DIAGNOSIS — E7800 Pure hypercholesterolemia, unspecified: Secondary | ICD-10-CM

## 2021-04-16 MED ORDER — ATORVASTATIN CALCIUM 40 MG PO TABS: 40.0000 mg | ORAL_TABLET | Freq: Every day | ORAL | 3 refills | Status: DC

## 2021-04-16 MED ORDER — TRIAMCINOLONE ACETONIDE 40 MG/ML IJ SUSP
40.0000 mg | Freq: Once | INTRAMUSCULAR | Status: AC
  Administered 2021-04-16: 40 mg via INTRAMUSCULAR

## 2021-04-16 NOTE — Progress Notes (Signed)
BP 131/80   Pulse 65   Temp 98.8 F (37.1 C) (Oral)   Ht 5' 4.57" (1.64 m)   Wt 215 lb 3.2 oz (97.6 kg)   SpO2 96%   BMI 36.29 kg/m    Subjective:    Patient ID: Elizabeth Malone, female    DOB: Jan 23, 1951, 70 y.o.   MRN: [id number]  Chief Complaint  Patient presents with  . Lesf shoulder pain    Patient would like to have injection today.     HPI: Elizabeth Malone is a 70 y.o. female  Shoulder Pain  The pain is present in the left shoulder. This is a chronic problem. The current episode started more than 1 year ago. The problem occurs intermittently. The problem has been gradually worsening. The quality of the pain is described as sharp. The pain is at a severity of 8/10. Associated symptoms include a limited range of motion and stiffness. Pertinent negatives include no fever, inability to bear weight, itching, joint locking, joint swelling, numbness or tingling.   Chief Complaint  Patient presents with  . Lesf shoulder pain    Patient would like to have injection today.     Relevant past medical, surgical, family and social history reviewed and updated as indicated. Interim medical history since our last visit reviewed. Allergies and medications reviewed and updated.  Review of Systems  Constitutional:  Negative for fever.  Musculoskeletal:  Positive for stiffness.  Skin:  Negative for itching.  Neurological:  Negative for tingling and numbness.   Per HPI unless specifically indicated above     Objective:    BP 131/80   Pulse 65   Temp 98.8 F (37.1 C) (Oral)   Ht 5' 4.57" (1.64 m)   Wt 215 lb 3.2 oz (97.6 kg)   SpO2 96%   BMI 36.29 kg/m   Wt Readings from Last 3 Encounters:  04/16/21 215 lb 3.2 oz (97.6 kg)  04/07/21 219 lb (99.3 kg)  03/26/21 210 lb (95.3 kg)    Physical Exam Vitals and nursing note reviewed.  Constitutional:      General: She is not in acute distress.    Appearance: Normal appearance. She is not ill-appearing or diaphoretic.  Eyes:      Conjunctiva/sclera: Conjunctivae normal.  Pulmonary:     Breath sounds: No rhonchi.  Abdominal:     General: Abdomen is flat. Bowel sounds are normal. There is no distension.     Palpations: Abdomen is soft. There is no mass.     Tenderness: There is no abdominal tenderness. There is no guarding.  Musculoskeletal:        General: Tenderness present.  Skin:    General: Skin is warm and dry.     Coloration: Skin is not jaundiced.     Findings: No erythema.  Neurological:     Mental Status: She is alert.    Results for orders placed or performed in visit on 02/18/21  Microscopic Examination   Urine  Result Value Ref Range   WBC, UA 0-5 0 - 5 /hpf   RBC None seen 0 - 2 /hpf   Epithelial Cells (non renal) 0-10 0 - 10 /hpf   Renal Epithel, UA 0-10 (A) None seen /hpf   Bacteria, UA Moderate (A) None seen/Few  Urinalysis, Routine w reflex microscopic  Result Value Ref Range   Specific Gravity, UA 1.020 1.005 - 1.030   pH, UA 7.0 5.0 - 7.5   Color, UA Yellow  Yellow   Appearance Ur Cloudy (A) Clear   Leukocytes,UA 3+ (A) Negative   Protein,UA Negative Negative/Trace   Glucose, UA Negative Negative   Ketones, UA Negative Negative   RBC, UA Negative Negative   Bilirubin, UA Negative Negative   Urobilinogen, Ur 0.2 0.2 - 1.0 mg/dL   Nitrite, UA Negative Negative   Microscopic Examination See below:         Current Outpatient Medications:  .  atorvastatin (LIPITOR) 40 MG tablet, Take 1 tablet (40 mg total) by mouth daily., Disp: 90 tablet, Rfl: 3 .  busPIRone (BUSPAR) 15 MG tablet, Take 1 tablet (15 mg total) by mouth 3 (three) times daily., Disp: 270 tablet, Rfl: 0 .  Calcium Citrate-Vitamin D (CALCIUM + D PO), Take 1 tablet by mouth 2 (two) times daily., Disp: , Rfl:  .  fexofenadine (ALLERGY RELIEF) 180 MG tablet, TAKE 1 TABLET(180 MG) BY MOUTH DAILY, Disp: 90 tablet, Rfl: 4 .  fluticasone (FLONASE) 50 MCG/ACT nasal spray, SHAKE LIQUID AND USE 2 SPRAYS IN EACH NOSTRIL DAILY,  Disp: 16 g, Rfl: 2 .  gabapentin (NEURONTIN) 300 MG capsule, Take 1 capsule (300 mg total) by mouth 3 (three) times daily., Disp: 270 capsule, Rfl: 0 .  lithium 300 MG tablet, Take 1 tablet (300 mg total) by mouth in the morning and at bedtime., Disp: 180 tablet, Rfl: 0 .  lurasidone (LATUDA) 20 MG TABS tablet, Take 1 tablet (20 mg total) by mouth daily with breakfast., Disp: 30 tablet, Rfl: 0 .  metoprolol succinate (TOPROL-XL) 25 MG 24 hr tablet, Take 1 tablet (25 mg total) by mouth daily., Disp: 90 tablet, Rfl: 4 .  montelukast (SINGULAIR) 10 MG tablet, TAKE 1 TABLET(10 MG) BY MOUTH AT BEDTIME, Disp: 90 tablet, Rfl: 1 .  Multiple Vitamin (MULTIVITAMIN) tablet, Take 1 tablet by mouth daily., Disp: , Rfl:  .  Omega-3 Fatty Acids (FISH OIL) 1000 MG CAPS, Take 1,000 mg by mouth 2 (two) times daily., Disp: , Rfl:  .  omeprazole (PRILOSEC) 40 MG capsule, Take 1 capsule (40 mg total) by mouth daily., Disp: 90 capsule, Rfl: 4 .  oxybutynin (DITROPAN-XL) 10 MG 24 hr tablet, Take 2 tablets (20 mg total) by mouth at bedtime., Disp: 180 tablet, Rfl: 4 .  QUEtiapine (SEROQUEL) 100 MG tablet, Take 100 mg by mouth at bedtime., Disp: , Rfl:  .  trazodone (DESYREL) 300 MG tablet, TAKE 1 TABLET (300 MG TOTAL) BY MOUTH AT BEDTIME., Disp: 90 tablet, Rfl: 0 .  triamcinolone (KENALOG) 0.1 %, Apply 1 application topically 2 (two) times daily., Disp: 45 each, Rfl: 0 .  trospium (SANCTURA) 20 MG tablet, , Disp: , Rfl:  .  valACYclovir (VALTREX) 1000 MG tablet, Take 1 tablet (1,000 mg total) by mouth 2 (two) times daily., Disp: 20 tablet, Rfl: 0 .  venlafaxine XR (EFFEXOR XR) 75 MG 24 hr capsule, Take 3 capsules daily, Disp: 270 capsule, Rfl: 0    Assessment & Plan:   The risks, benefits and side effects of treatment were discussed with the patient. After consent was obtained, using sterile technique the left Shoulder was prepped Pt's left Shoulder  joint prepped and cleaned with alcohol wipes. The Acromioclavicular  joint was palpated and  pt injected with 1 ml of kenalog and 4 ml of lidocaine under aseptic conditions. no bleeding or post procedural pain noted. Pt felt better after procedure, tolerated procedure well.  The patient was advised to call the office if symptoms worsen or do not  improve. The patient is asked to continue to rest the shoulder for a few more days before resuming regular activities .Call or return to clinic prn if such symptoms occur.     Problem List Items Addressed This Visit       Cardiovascular and Mediastinum   Essential hypertension   Relevant Medications   atorvastatin (LIPITOR) 40 MG tablet     Other   Hypercholesteremia - Primary   Relevant Medications   atorvastatin (LIPITOR) 40 MG tablet   Obesity   Other Visit Diagnoses     Hypertension, unspecified type       Relevant Medications   atorvastatin (LIPITOR) 40 MG tablet        No orders of the defined types were placed in this encounter.    Meds ordered this encounter  Medications  . atorvastatin (LIPITOR) 40 MG tablet    Sig: Take 1 tablet (40 mg total) by mouth daily.    Dispense:  90 tablet    Refill:  3  . triamcinolone acetonide (KENALOG-40) injection 40 mg     Follow up plan: No follow-ups on file.

## 2021-04-17 LAB — CBC WITH DIFFERENTIAL/PLATELET
Basophils Absolute: 0 10*3/uL (ref 0.0–0.2)
Basos: 0 %
EOS (ABSOLUTE): 0.2 10*3/uL (ref 0.0–0.4)
Eos: 2 %
Hematocrit: 43.5 % (ref 34.0–46.6)
Hemoglobin: 13.5 g/dL (ref 11.1–15.9)
Immature Grans (Abs): 0 10*3/uL (ref 0.0–0.1)
Immature Granulocytes: 0 %
Lymphocytes Absolute: 1.6 10*3/uL (ref 0.7–3.1)
Lymphs: 19 %
MCH: 29 pg (ref 26.6–33.0)
MCHC: 31 g/dL — ABNORMAL LOW (ref 31.5–35.7)
MCV: 94 fL (ref 79–97)
Monocytes Absolute: 0.4 10*3/uL (ref 0.1–0.9)
Monocytes: 5 %
Neutrophils Absolute: 6.2 10*3/uL (ref 1.4–7.0)
Neutrophils: 74 %
Platelets: 286 10*3/uL (ref 150–450)
RBC: 4.65 x10E6/uL (ref 3.77–5.28)
RDW: 11.8 % (ref 11.7–15.4)
WBC: 8.4 10*3/uL (ref 3.4–10.8)

## 2021-04-17 LAB — COMPREHENSIVE METABOLIC PANEL
ALT: 18 IU/L (ref 0–32)
AST: 16 IU/L (ref 0–40)
Albumin/Globulin Ratio: 2 (ref 1.2–2.2)
Albumin: 4.3 g/dL (ref 3.8–4.8)
Alkaline Phosphatase: 130 IU/L — ABNORMAL HIGH (ref 44–121)
BUN/Creatinine Ratio: 11 — ABNORMAL LOW (ref 12–28)
BUN: 9 mg/dL (ref 8–27)
Bilirubin Total: 0.4 mg/dL (ref 0.0–1.2)
CO2: 25 mmol/L (ref 20–29)
Calcium: 9.8 mg/dL (ref 8.7–10.3)
Chloride: 102 mmol/L (ref 96–106)
Creatinine, Ser: 0.8 mg/dL (ref 0.57–1.00)
Globulin, Total: 2.2 g/dL (ref 1.5–4.5)
Glucose: 105 mg/dL — ABNORMAL HIGH (ref 65–99)
Potassium: 5.1 mmol/L (ref 3.5–5.2)
Sodium: 140 mmol/L (ref 134–144)
Total Protein: 6.5 g/dL (ref 6.0–8.5)
eGFR: 80 mL/min/{1.73_m2} (ref >59)

## 2021-04-17 LAB — LIPID PANEL
Chol/HDL Ratio: 3.5 ratio (ref 0.0–4.4)
Cholesterol, Total: 190 mg/dL (ref 100–199)
HDL: 54 mg/dL (ref >39)
LDL Chol Calc (NIH): 100 mg/dL — ABNORMAL HIGH (ref 0–99)
Triglycerides: 212 mg/dL — ABNORMAL HIGH (ref 0–149)
VLDL Cholesterol Cal: 36 mg/dL (ref 5–40)

## 2021-04-17 LAB — THYROID PANEL WITH TSH
Free Thyroxine Index: 1.8 (ref 1.2–4.9)
T3 Uptake Ratio: 24 % (ref 24–39)
T4, Total: 7.3 ug/dL (ref 4.5–12.0)
TSH: 2.49 u[IU]/mL (ref 0.450–4.500)

## 2021-04-17 NOTE — Progress Notes (Signed)
Please let pt know triglycerides still high he needs to work on low-fat diet and exercise regimen

## 2021-04-28 ENCOUNTER — Ambulatory Visit: Payer: Medicare PPO | Admitting: Internal Medicine

## 2021-04-30 ENCOUNTER — Other Ambulatory Visit (HOSPITAL_COMMUNITY): Payer: Self-pay | Admitting: Psychiatry

## 2021-05-01 ENCOUNTER — Telehealth: Payer: Self-pay

## 2021-05-01 ENCOUNTER — Other Ambulatory Visit: Payer: Self-pay | Admitting: Psychiatry

## 2021-05-01 MED ORDER — QUETIAPINE FUMARATE 100 MG PO TABS: 100.0000 mg | ORAL_TABLET | Freq: Every day | ORAL | 0 refills | Status: DC

## 2021-05-01 MED ORDER — LURASIDONE HCL 20 MG PO TABS: 20.0000 mg | ORAL_TABLET | Freq: Every day | ORAL | 0 refills | Status: DC

## 2021-05-01 NOTE — Telephone Encounter (Signed)
Decline. This medication was disconitnued

## 2021-05-01 NOTE — Telephone Encounter (Signed)
Ordered latuda. Please advise the patient to discontinue seroquel (which was discussed at her last visit) to avoid polypharmacy.

## 2021-05-01 NOTE — Telephone Encounter (Signed)
pt called back states that the latuda is too high and she wants to stay on the seroquel.  Pt states that when the pharmacy called and told her the cost she told them not to fill that she couldn't afford. Please send the seroquel

## 2021-05-01 NOTE — Telephone Encounter (Signed)
pt called left a message that she needs refills on latuda and seroquel enogh to get to next appt.

## 2021-05-01 NOTE — Telephone Encounter (Signed)
Ordered

## 2021-05-02 NOTE — Progress Notes (Signed)
Virtual Visit via Video Note  I connected with Elizabeth Malone on 05/06/21 at  8:00 AM EDT by a video enabled telemedicine application and verified that I am speaking with the correct person using two identifiers.  Location: Patient: home Provider: office Persons participated in the visit- patient, provider    I discussed the limitations of evaluation and management by telemedicine and the availability of in person appointments. The patient expressed understanding and agreed to proceed.   I discussed the assessment and treatment plan with the patient. The patient was provided an opportunity to ask questions and all were answered. The patient agreed with the plan and demonstrated an understanding of the instructions.   The patient was advised to call back or seek an in-person evaluation if the symptoms worsen or if the condition fails to improve as anticipated.  I provided 20 minutes of non-face-to-face time during this encounter.   [name], MD    [facility] MD/PA/NP OP Progress Note  05/06/2021 8:34 AM Elizabeth Malone  MRN:  [id number]  Chief Complaint:  Chief Complaint   Follow-up; Other    HPI:  This is a follow-up appointment for bipolar disorder and insomnia.  She states that her husband had 2 cardiac procedures.  She also lost her mother a few weeks ago.  It was expected.  She thinks she has been handling things well despite these things.  Although she cried for the loss of her mother, she also had a good lunch with her family, who she has not met for a while.  She reports good support from church.  She has been able to feel rested and less anxiety now that her husband's procedure is over.  She sleeps well.  She denies feeling depressed or anhedonia.  She feels good about recent weight loss since working on her diet.  She denies SI.  She denies decreased need for sleep or euphonia.  She denies increased goal-directed activity.  She denies VH anymore.  She denies AH.  She states that  she has been doing very well since there was some medication change last December when she was struggling with depression.  She does not see the need to change her medication.  However, after provided psychoeducation, she may be interested in tapering down lithium in the future as she experiences dry mouth.    Wt Readings from Last 3 Encounters:  04/16/21 215 lb 3.2 oz (97.6 kg)  04/07/21 219 lb (99.3 kg)  03/26/21 210 lb (95.3 kg)      Daily routine: sit on the porch with her husband Exercise: Employment: retired in 2008, used to work as Administrator Support: Household: husband Marital status: married for 50 years in 2022 Number of children: 2 (1 son and 1 daughter in [place]) She grew up in [place].  She reports lack of nurturing as a child.  Her father died from MI when young. Her mother was driving with her girlfriend, being around with married man while Grier was sitting in the back of the car. Had good relationship with her grandmother  Visit Diagnosis:    ICD-10-CM   1. Insomnia, unspecified type  G47.00     2. Bipolar 1 disorder, depressed, partial remission (HCC)  F31.75 trazodone (DESYREL) 300 MG tablet    venlafaxine XR (EFFEXOR XR) 75 MG 24 hr capsule    gabapentin (NEURONTIN) 300 MG capsule    3. Anxiety  F41.9 venlafaxine XR (EFFEXOR XR) 75 MG 24 hr capsule  gabapentin (NEURONTIN) 300 MG capsule    busPIRone (BUSPAR) 15 MG tablet      Past Psychiatric History: Please see initial evaluation for full details. I have reviewed the history. No updates at this time.     Past Medical History:  Past Medical History:  Diagnosis Date   Anxiety    Bipolar disorder (HCC)    CKD (chronic kidney disease) stage 3, GFR 30-59 ml/min (HCC)    Complication of anesthesia    Depression    GERD (gastroesophageal reflux disease)    occasionally has to use tums   Headache    History of diverticulosis    Hyperlipidemia    Macular degeneration    Overactive  bladder    PONV (postoperative nausea and vomiting)     Past Surgical History:  Procedure Laterality Date   COLONOSCOPY WITH PROPOFOL N/A 10/07/2017   Procedure: COLONOSCOPY WITH PROPOFOL;  Surgeon: Wohl, Darren, MD;  Location: [facility];  Service: Endoscopy;  Laterality: N/A;   FOOT SURGERY Bilateral    KNEE SURGERY Left    POLYPECTOMY  10/07/2017   Procedure: POLYPECTOMY;  Surgeon: Wohl, Darren, MD;  Location: [facility];  Service: Endoscopy;;   TONSILLECTOMY AND ADENOIDECTOMY      Family Psychiatric History: Please see initial evaluation for full details. I have reviewed the history. No updates at this time.     Family History:  Family History  Problem Relation Age of Onset   Alzheimer's disease Mother    Bipolar disorder Mother    Depression Mother    Heart attack Father    Diabetes Brother    Obesity Brother    Depression Maternal Grandfather    Depression Daughter    Stroke Maternal Grandmother    Stroke Paternal Grandmother    Heart attack Paternal Grandfather    Breast cancer Neg Hx     Social History:  Social History   Socioeconomic History   Marital status: Married    Spouse name: Not on file   Number of children: 2   Years of education: Not on file   Highest education level: Bachelor's degree (e.g., BA, AB, BS)  Occupational History   Occupation: retired   Tobacco Use   Smoking status: Former    Types: Cigarettes    Start date: 07/09/1967    Quit date: 07/08/1985    Years since quitting: 35.8   Smokeless tobacco: Never  Vaping Use   Vaping Use: Never used  Substance and Sexual Activity   Alcohol use: Yes    Alcohol/week: 3.0 standard drinks    Types: 3 Shots of liquor per week    Comment: occassional   Drug use: No   Sexual activity: Not Currently  Other Topics Concern   Not on file  Social History Narrative   Not on file   Social Determinants of Health   Financial Resource Strain: Low Risk    Difficulty of Paying Living  Expenses: Not hard at all  Food Insecurity: No Food Insecurity   Worried About Running Out of Food in the Last Year: Never true   Ran Out of Food in the Last Year: Never true  Transportation Needs: No Transportation Needs   Lack of Transportation (Medical): No   Lack of Transportation (Non-Medical): No  Physical Activity: Inactive   Days of Exercise per Week: 0 days   Minutes of Exercise per Session: 0 min  Stress: No Stress Concern Present   Feeling of Stress : Not at all    Social Connections: Not on file    Allergies:  Allergies  Allergen Reactions   Ace Inhibitors Other (See Comments)   Fluoxetine Other (See Comments)   Hydrocodone-Chlorpheniramine Other (See Comments)   Paroxetine Hcl Other (See Comments)   Penicillin G Benzathine Other (See Comments)   Penicillin V Potassium Other (See Comments)   Clindamycin Hcl Rash and Other (See Comments)    Elizabeth Right' syndrome   Lamotrigine Rash    Metabolic Disorder Labs: Lab Results  Component Value Date   HGBA1C 4.9 08/17/2017   No results found for: PROLACTIN Lab Results  Component Value Date   CHOL 190 04/16/2021   TRIG 212 (H) 04/16/2021   HDL 54 04/16/2021   CHOLHDL 3.5 04/16/2021   VLDL 39 (H) 08/17/2017   LDLCALC 100 (H) 04/16/2021   LDLCALC 94 02/04/2021   Lab Results  Component Value Date   TSH 2.490 04/16/2021   TSH 3.030 02/04/2021    Therapeutic Level Labs: Lab Results  Component Value Date   LITHIUM 0.72 04/22/2020   LITHIUM 0.74 11/01/2018   No results found for: VALPROATE No components found for:  CBMZ  Current Medications: Current Outpatient Medications  Medication Sig Dispense Refill   atorvastatin (LIPITOR) 40 MG tablet Take 1 tablet (40 mg total) by mouth daily. 90 tablet 3   [START ON 06/23/2021] busPIRone (BUSPAR) 15 MG tablet Take 1 tablet (15 mg total) by mouth 3 (three) times daily. 270 tablet 1   Calcium Citrate-Vitamin D (CALCIUM + D PO) Take 1 tablet by mouth 2 (two) times  daily.     fexofenadine (ALLERGY RELIEF) 180 MG tablet TAKE 1 TABLET(180 MG) BY MOUTH DAILY 90 tablet 4   fluticasone (FLONASE) 50 MCG/ACT nasal spray SHAKE LIQUID AND USE 2 SPRAYS IN EACH NOSTRIL DAILY 16 g 2   [START ON 06/23/2021] gabapentin (NEURONTIN) 300 MG capsule Take 1 capsule (300 mg total) by mouth 3 (three) times daily. 270 capsule 1   lithium 300 MG tablet Take 1 tablet (300 mg total) by mouth in the morning and at bedtime. 180 tablet 0   metoprolol succinate (TOPROL-XL) 25 MG 24 hr tablet Take 1 tablet (25 mg total) by mouth daily. 90 tablet 4   montelukast (SINGULAIR) 10 MG tablet TAKE 1 TABLET(10 MG) BY MOUTH AT BEDTIME 90 tablet 1   Multiple Vitamin (MULTIVITAMIN) tablet Take 1 tablet by mouth daily.     Omega-3 Fatty Acids (FISH OIL) 1000 MG CAPS Take 1,000 mg by mouth 2 (two) times daily.     omeprazole (PRILOSEC) 40 MG capsule Take 1 capsule (40 mg total) by mouth daily. 90 capsule 4   oxybutynin (DITROPAN-XL) 10 MG 24 hr tablet Take 2 tablets (20 mg total) by mouth at bedtime. 180 tablet 4   QUEtiapine (SEROQUEL) 100 MG tablet Take 1 tablet (100 mg total) by mouth at bedtime. 90 tablet 0   [START ON 05/27/2021] trazodone (DESYREL) 300 MG tablet Take 1 tablet (300 mg total) by mouth at bedtime. 90 tablet 1   triamcinolone (KENALOG) 0.1 % Apply 1 application topically 2 (two) times daily. 45 each 0   trospium (SANCTURA) 20 MG tablet      valACYclovir (VALTREX) 1000 MG tablet Take 1 tablet (1,000 mg total) by mouth 2 (two) times daily. 20 tablet 0   [START ON 06/23/2021] venlafaxine XR (EFFEXOR XR) 75 MG 24 hr capsule Take 3 capsules (225 mg total) by mouth daily. 270 capsule 1   No current facility-administered medications for  this visit.     Musculoskeletal: Strength & Muscle Tone:  N/A Gait & Station:  N/A Patient leans: N/A  Psychiatric Specialty Exam: Review of Systems  Psychiatric/Behavioral:  Negative for agitation, behavioral problems, confusion, decreased  concentration, dysphoric mood, hallucinations, self-injury, sleep disturbance and suicidal ideas. The patient is not nervous/anxious and is not hyperactive.   All other systems reviewed and are negative.  There were no vitals taken for this visit.There is no height or weight on file to calculate BMI.  General Appearance: Fairly Groomed  Eye Contact:  Good  Speech:  Clear and Coherent  Volume:  Normal  Mood:   good  Affect:  Appropriate, Congruent, and euthymic  Thought Process:  Coherent  Orientation:  Full (Time, Place, and Person)  Thought Content: Logical   Suicidal Thoughts:  No  Homicidal Thoughts:  No  Memory:  Immediate;   Good  Judgement:  Good  Insight:  Good  Psychomotor Activity:  Normal  Concentration:  Concentration: Good and Attention Span: Good  Recall:  Good  Fund of Knowledge: Good  Language: Good  Akathisia:  No  Handed:  Right  AIMS (if indicated): not done  Assets:  Communication Skills Desire for Improvement  ADL's:  Intact  Cognition: WNL  Sleep:  Good   Screenings: GAD-7    Flowsheet Row Office Visit from 11/15/2020 in [facility] Office Visit from 04/01/2020 in [facility] Office Visit from 02/23/2019 in [facility]  Total GAD-7 Score 15 18 14      PHQ2-9    Flowsheet Row Video Visit from 05/06/2021 in [facility] Office Visit from 04/16/2021 in [facility] Clinical Support from 04/07/2021 in [facility] Video Visit from 03/25/2021 in [facility] Office Visit from 02/18/2021 in [facility]  PHQ-2 Total Score 0 2 3 1 0  PHQ-9 Total Score -- 6 4 -- 0      Flowsheet Row Video Visit from 05/06/2021 in [facility] Video Visit from 03/25/2021 in [facility]  C-SSRS RISK CATEGORY No Risk No Risk        Assessment and Plan:  Denia S Fetch is a 69 y.o. year old female  with a history of bipolar I disorder, history of stage III CKD (resolved after ACI was discontinued per patient), hypertension, GERD, PONV, hyperlipidemia, who presents for follow up appointment for below.    1. Bipolar 1 disorder, depressed, partial remission (HCC) 2. Anxiety She denies significant mood symptoms despite loss of her mother, and her husband, who has been undergoing cardiac procedure.  She continues to engage well with church activities, and reports good relationship with her husband.  Although he was attempted to switch from quetiapine to Latuda given her history of binge eating/weight gain, she could not afford this medication.  She also has strong preference to stay on the current medication regimen at this time, although she has some interest in tapering down lithium in the future given her dry mouth.  Will continue lithium and quetiapine to target bipolar disorder.  Will continue venlafaxine to target depression and anxiety.  Will continue BuSpar and gabapentin to target anxiety.   3. Insomnia, unspecified type She reports good benefit from trazodone.  Will continue current dose to target insomnia.    Plan Continue lithium 300 mg twice a day- monitor dry mouth Continue venlafaxine 225 mg daily Continue quetiapine 100 mg at night Continue Buspar 15 mg three times   a day, Continue gabapentin 300 mg three times a day Continue trazodone 300 mg at night as needed for sleep Next appointment- 11/14 at 2:30, in person Obtain labs (TSH, lithium)    The patient demonstrates the following risk factors for suicide: Chronic risk factors for suicide include: psychiatric disorder of bipolar disorder and previous suicide attempts of cutting, putting cord around her neck, overdose on medication . Acute risk factors for suicide include: unemployment. Protective factors for this patient include: positive social support, coping skills, and hope for the future. Considering these factors, the  overall suicide risk at this point appears to be low. Patient is appropriate for outpatient follow up.            [name], MD 05/06/2021, 8:34 AM

## 2021-05-06 ENCOUNTER — Encounter: Payer: Self-pay | Admitting: Psychiatry

## 2021-05-06 ENCOUNTER — Telehealth (INDEPENDENT_AMBULATORY_CARE_PROVIDER_SITE_OTHER): Payer: Medicare PPO | Admitting: Psychiatry

## 2021-05-06 ENCOUNTER — Other Ambulatory Visit: Payer: Self-pay

## 2021-05-06 DIAGNOSIS — F419 Anxiety disorder, unspecified: Secondary | ICD-10-CM

## 2021-05-06 DIAGNOSIS — F3175 Bipolar disorder, in partial remission, most recent episode depressed: Secondary | ICD-10-CM | POA: Diagnosis not present

## 2021-05-06 DIAGNOSIS — G4700 Insomnia, unspecified: Secondary | ICD-10-CM

## 2021-05-06 MED ORDER — BUSPIRONE HCL 15 MG PO TABS: 15.0000 mg | ORAL_TABLET | Freq: Three times a day (TID) | ORAL | 1 refills | Status: AC

## 2021-05-06 MED ORDER — TRAZODONE HCL 300 MG PO TABS: 300.0000 mg | ORAL_TABLET | Freq: Every day | ORAL | 1 refills | Status: DC

## 2021-05-06 MED ORDER — GABAPENTIN 300 MG PO CAPS: 300.0000 mg | ORAL_CAPSULE | Freq: Three times a day (TID) | ORAL | 1 refills | Status: DC

## 2021-05-06 MED ORDER — VENLAFAXINE HCL ER 75 MG PO CP24: 225.0000 mg | ORAL_CAPSULE | Freq: Every day | ORAL | 1 refills | Status: DC

## 2021-05-07 ENCOUNTER — Encounter: Payer: Self-pay | Admitting: Psychiatry

## 2021-05-07 ENCOUNTER — Other Ambulatory Visit
Admission: RE | Admit: 2021-05-07 | Discharge: 2021-05-07 | Disposition: A | Discharge status: Home / Self Care | Payer: Medicare PPO | Attending: Psychiatry | Admitting: Psychiatry

## 2021-05-07 DIAGNOSIS — F3176 Bipolar disorder, in full remission, most recent episode depressed: Secondary | ICD-10-CM | POA: Diagnosis not present

## 2021-05-07 LAB — LITHIUM LEVEL: Lithium Lvl: 0.66 mmol/L (ref 0.60–1.20)

## 2021-05-07 LAB — TSH: TSH: 1.538 u[IU]/mL (ref 0.350–4.500)

## 2021-06-11 ENCOUNTER — Ambulatory Visit: Payer: Medicare PPO | Admitting: Internal Medicine

## 2021-06-11 ENCOUNTER — Other Ambulatory Visit: Payer: Self-pay

## 2021-06-11 ENCOUNTER — Encounter: Payer: Self-pay | Admitting: Internal Medicine

## 2021-06-11 VITALS — BP 120/80 | HR 57 | Temp 99.0°F | Ht 64.57 in | Wt 214.4 lb

## 2021-06-11 DIAGNOSIS — Z23 Encounter for immunization: Secondary | ICD-10-CM | POA: Insufficient documentation

## 2021-06-11 DIAGNOSIS — M25512 Pain in left shoulder: Secondary | ICD-10-CM | POA: Diagnosis not present

## 2021-06-11 DIAGNOSIS — E7800 Pure hypercholesterolemia, unspecified: Secondary | ICD-10-CM

## 2021-06-11 DIAGNOSIS — G8929 Other chronic pain: Secondary | ICD-10-CM | POA: Diagnosis not present

## 2021-06-11 DIAGNOSIS — M25511 Pain in right shoulder: Secondary | ICD-10-CM | POA: Diagnosis not present

## 2021-06-11 MED ORDER — TRIAMCINOLONE ACETONIDE 40 MG/ML IJ SUSP
40.0000 mg | Freq: Once | INTRAMUSCULAR | Status: AC
  Administered 2021-06-11: 40 mg via INTRAMUSCULAR

## 2021-06-11 NOTE — Progress Notes (Signed)
BP 120/80   Pulse (!) 57   Temp 99 F (37.2 C) (Oral)   Ht 5' 4.57" (1.64 m)   Wt 214 lb 6.4 oz (97.3 kg)   SpO2 99%   BMI 36.16 kg/m    Subjective:    Patient ID: Elizabeth Malone, female    DOB: 1950-11-02, 70 y.o.   MRN: [id number]  No chief complaint on file.   HPI: Elizabeth Malone is a 70 y.o. female  Patient presents with: Shoulder Pain: Right shoulder pain Pt c/o right shoulder pain worsening.     Shoulder Pain  The pain is present in the right shoulder. This is a recurrent problem. The current episode started more than 1 month ago. The pain is at a severity of 6/10. The pain is moderate. Associated symptoms include a limited range of motion and stiffness. Pertinent negatives include no fever, inability to bear weight, itching, joint locking, joint swelling, numbness or tingling. The symptoms are aggravated by activity. She has tried heat, NSAIDS and OTC ointments for the symptoms. The treatment provided moderate relief. Her past medical history is significant for osteoarthritis.   No chief complaint on file.   Relevant past medical, surgical, family and social history reviewed and updated as indicated. Interim medical history since our last visit reviewed. Allergies and medications reviewed and updated.  Review of Systems  Constitutional:  Negative for fever.  Musculoskeletal:  Positive for stiffness.  Skin:  Negative for itching.  Neurological:  Negative for tingling and numbness.   Per HPI unless specifically indicated above     Objective:    BP 120/80   Pulse (!) 57   Temp 99 F (37.2 C) (Oral)   Ht 5' 4.57" (1.64 m)   Wt 214 lb 6.4 oz (97.3 kg)   SpO2 99%   BMI 36.16 kg/m   Wt Readings from Last 3 Encounters:  06/11/21 214 lb 6.4 oz (97.3 kg)  04/16/21 215 lb 3.2 oz (97.6 kg)  04/07/21 219 lb (99.3 kg)    Physical Exam Vitals and nursing note reviewed.  Constitutional:      General: She is not in acute distress.    Appearance: Normal  appearance. She is obese. She is not ill-appearing or diaphoretic.  Cardiovascular:     Rate and Rhythm: Normal rate and regular rhythm.  Pulmonary:     Effort: Pulmonary effort is normal.     Breath sounds: Normal breath sounds. No rhonchi.  Abdominal:     General: There is no distension.     Tenderness: There is no abdominal tenderness. There is no guarding.  Musculoskeletal:        General: Tenderness present. No swelling or signs of injury.     Right lower leg: No edema.     Left lower leg: No edema.  Skin:    General: Skin is warm and dry.     Coloration: Skin is not jaundiced or pale.     Findings: No erythema or rash.  Neurological:     Mental Status: She is alert.  Psychiatric:        Mood and Affect: Mood normal.        Behavior: Behavior normal.   Results for orders placed or performed during the hospital encounter of 05/07/21  Lithium level  Result Value Ref Range   Lithium Lvl 0.66 0.60 - 1.20 mmol/L  TSH  Result Value Ref Range   TSH 1.538 0.350 - 4.500 uIU/mL  Current Outpatient Medications:  .  atorvastatin (LIPITOR) 40 MG tablet, Take 1 tablet (40 mg total) by mouth daily., Disp: 90 tablet, Rfl: 3 .  [START ON 06/23/2021] busPIRone (BUSPAR) 15 MG tablet, Take 1 tablet (15 mg total) by mouth 3 (three) times daily., Disp: 270 tablet, Rfl: 1 .  Calcium Citrate-Vitamin D (CALCIUM + D PO), Take 1 tablet by mouth 2 (two) times daily., Disp: , Rfl:  .  fexofenadine (ALLERGY RELIEF) 180 MG tablet, TAKE 1 TABLET(180 MG) BY MOUTH DAILY, Disp: 90 tablet, Rfl: 4 .  fluticasone (FLONASE) 50 MCG/ACT nasal spray, SHAKE LIQUID AND USE 2 SPRAYS IN EACH NOSTRIL DAILY, Disp: 16 g, Rfl: 2 .  [START ON 06/23/2021] gabapentin (NEURONTIN) 300 MG capsule, Take 1 capsule (300 mg total) by mouth 3 (three) times daily., Disp: 270 capsule, Rfl: 1 .  lithium 300 MG tablet, Take 1 tablet (300 mg total) by mouth in the morning and at bedtime., Disp: 180 tablet, Rfl: 0 .  metoprolol  succinate (TOPROL-XL) 25 MG 24 hr tablet, Take 1 tablet (25 mg total) by mouth daily., Disp: 90 tablet, Rfl: 4 .  montelukast (SINGULAIR) 10 MG tablet, TAKE 1 TABLET(10 MG) BY MOUTH AT BEDTIME, Disp: 90 tablet, Rfl: 1 .  Multiple Vitamin (MULTIVITAMIN) tablet, Take 1 tablet by mouth daily., Disp: , Rfl:  .  Omega-3 Fatty Acids (FISH OIL) 1000 MG CAPS, Take 1,000 mg by mouth 2 (two) times daily., Disp: , Rfl:  .  omeprazole (PRILOSEC) 40 MG capsule, Take 1 capsule (40 mg total) by mouth daily., Disp: 90 capsule, Rfl: 4 .  oxybutynin (DITROPAN-XL) 10 MG 24 hr tablet, Take 2 tablets (20 mg total) by mouth at bedtime., Disp: 180 tablet, Rfl: 4 .  QUEtiapine (SEROQUEL) 100 MG tablet, Take 1 tablet (100 mg total) by mouth at bedtime., Disp: 90 tablet, Rfl: 0 .  trazodone (DESYREL) 300 MG tablet, Take 1 tablet (300 mg total) by mouth at bedtime., Disp: 90 tablet, Rfl: 1 .  triamcinolone (KENALOG) 0.1 %, Apply 1 application topically 2 (two) times daily., Disp: 45 each, Rfl: 0 .  trospium (SANCTURA) 20 MG tablet, , Disp: , Rfl:  .  valACYclovir (VALTREX) 1000 MG tablet, Take 1 tablet (1,000 mg total) by mouth 2 (two) times daily., Disp: 20 tablet, Rfl: 0 .  [START ON 06/23/2021] venlafaxine XR (EFFEXOR XR) 75 MG 24 hr capsule, Take 3 capsules (225 mg total) by mouth daily., Disp: 270 capsule, Rfl: 1    Assessment & Plan:   Right shoudler pain : sec to OA , chronic , Worsening Will inject shoulder joint with kenalog as below   Right shoulder injection  The risks, benefits and side effects of treatment were discussed with the patient. After consent was obtained, using sterile technique the right Shoulder was prepped and cleaned with betadine. The Acromioclavicular joint was palpated and  pt injected with 1 ml of kenalog and 4 ml of lidocaine under aseptic conditions. no bleeding or post procedural pain noted. Pt felt better after procedure, tolerated procedure well.  The patient was advised to call the  office if symptoms worsen or do not improve. The patient is asked to continue to rest the shoulder for a few more days before resuming regular activities .Call or return to clinic prn if such symptoms occur or the knee fails to improve as anticipated.     HLD is on atorvastatin 40 mg increase to 80 mg  recheck FLP, check LFT's work on diet,  SE of meds explained to pt. low fat and high fiber diet explained to pt.  Problem List Items Addressed This Visit       Other   Hypercholesteremia   Chronic pain of both shoulders   Need for influenza vaccination - Primary   Relevant Orders   Flu Vaccine QUAD High Dose(Fluad)     Orders Placed This Encounter  Procedures  . Flu Vaccine QUAD High Dose(Fluad)     No orders of the defined types were placed in this encounter.    Follow up plan: Return in about 3 weeks (around 07/02/2021).

## 2021-07-02 ENCOUNTER — Ambulatory Visit: Payer: Medicare PPO | Admitting: Internal Medicine

## 2021-07-23 NOTE — Progress Notes (Addendum)
Elizabeth Malone  07/28/2021 3:05 PM Elizabeth Malone  MRN:  [id number]  Chief Complaint:  Chief Complaint   Follow-up    HPI:  This is a follow-up appointment for bipolar disorder and anxiety.  She states that she lost her mother in September.  Her mother was suffering from Alzheimer's disease.  Her brother was taking care of her.  She had a nice service for her mother.  She states that she had "very mixed "relationship with her mother, stating that her mother may have been suffering from bipolar disorder.  Her husband has been doing better since he had cardiac procedure.  Although he used to be hateful, she reports better relationship with him.  She enjoys going out with her friends.  She is planning to start a new group for Bible.  Although she tends to over promised to others, she denies any increased goal-directed activities.  She has been taking lower dose of lithium with the thought that it was discussed.  She has not noticed any difference since lowering the dose.  She sleeps well.  She denies feeling depressed or anhedonia.  She has fair energy and an appetite.  She denies SI.  She denies decreased need for sleep or euphonia.  She feels comfortable to stay on her medication.    Wt Readings from Last 3 Encounters:  07/28/21 214 lb 12.8 oz (97.4 kg)  06/11/21 214 lb 6.4 oz (97.3 kg)  04/16/21 215 lb 3.2 oz (97.6 kg)     Daily routine: sit on the porch with her husband Exercise: Employment: retired in 2008, used to work as Administrator Support: Household: husband Marital status: married for 50 years in 2022 Number of children: 2 (1 son and 1 daughter in [place]) She grew up in [place].  She reports lack of nurturing as a child.  Her father died from MI when young. Her mother was driving with her girlfriend, being around with married man while Elizabeth Malone was sitting in the back of the car. Had good relationship with her grandmother  Visit Diagnosis:     ICD-10-CM   1. Bipolar 1 disorder, depressed, partial remission ([place])  F31.75     2. Anxiety  F41.9     3. Insomnia, unspecified type  G47.00       Past Psychiatric History: Please see initial evaluation for full details. I have reviewed the history. No updates at this time.     Past Medical History:  Past Medical History:  Diagnosis Date   Anxiety    Bipolar disorder ([place])    CKD (chronic kidney disease) stage 3, GFR 30-59 ml/min (H[facility])    Complication of anesthesia    Depression    GERD (gastroesophageal reflux disease)    occasionally has to use tums   Headache    History of diverticulosis    Hyperlipidemia    Macular degeneration    Overactive bladder    PONV (postoperative nausea and vomiting)     Past Surgical History:  Procedure Laterality Date   COLONOSCOPY WITH PROPOFOL N/A 10/07/2017   Procedure: COLONOSCOPY WITH PROPOFOL;  Surgeon: [name], MD;  Location: [facility];  Service: Endoscopy;  Laterality: N/A;   FOOT SURGERY Bilateral    KNEE SURGERY Left    POLYPECTOMY  10/07/2017   Procedure: POLYPECTOMY;  Surgeon: [name], MD;  Location: [facility];  Service: Endoscopy;;   TONSILLECTOMY AND ADENOIDECTOMY      Family Psychiatric History: Please see  initial evaluation for full details. I have reviewed the history. No updates at this time.     Family History:  Family History  Problem Relation Age of Onset   Alzheimer's disease Mother    Bipolar disorder Mother    Depression Mother    Heart attack Father    Diabetes Brother    Obesity Brother    Depression Daughter    Stroke Maternal Grandmother    Depression Maternal Grandfather    Stroke Paternal Grandmother    Heart attack Paternal Grandfather    Breast cancer Neg Hx     Social History:  Social History   Socioeconomic History   Marital status: Married    Spouse name: Not on file   Number of children: 2   Years of education: Not on file   Highest education level:  Bachelor's degree (e.g., BA, AB, BS)  Occupational History   Occupation: retired   Tobacco Use   Smoking status: Former    Types: Cigarettes    Start date: 07/09/1967    Quit date: 07/08/1985    Years since quitting: 36.0   Smokeless tobacco: Never  Vaping Use   Vaping Use: Never used  Substance and Sexual Activity   Alcohol use: Yes    Alcohol/week: 3.0 standard drinks    Types: 3 Shots of liquor per week    Comment: occassional   Drug use: No   Sexual activity: Not Currently  Other Topics Concern   Not on file  Social History Narrative   Not on file   Social Determinants of Health   Financial Resource Strain: Low Risk    Difficulty of Paying Living Expenses: Not hard at all  Food Insecurity: No Food Insecurity   Worried About Charity fundraiser in the Last Year: Never true   [place] in the Last Year: Never true  Transportation Needs: No Transportation Needs   Lack of Transportation (Medical): No   Lack of Transportation (Non-Medical): No  Physical Activity: Inactive   Days of Exercise per Week: 0 days   Minutes of Exercise per Session: 0 min  Stress: No Stress Concern Present   Feeling of Stress : Not at all  Social Connections: Not on file    Allergies:  Allergies  Allergen Reactions   Ace Inhibitors Other (See Comments)   Fluoxetine Other (See Comments)   Hydrocodone-Chlorpheniramine Other (See Comments)   Paroxetine Hcl Other (See Comments)   Penicillin G Benzathine Other (See Comments)   Penicillin V Potassium Other (See Comments)   Clindamycin Hcl Rash and Other (See Comments)    Katherina Right' syndrome   Lamotrigine Rash    Metabolic Disorder Labs: Lab Results  Component Value Date   HGBA1C 4.9 08/17/2017   No results found for: PROLACTIN Lab Results  Component Value Date   CHOL 190 04/16/2021   TRIG 212 (H) 04/16/2021   HDL 54 04/16/2021   CHOLHDL 3.5 04/16/2021   VLDL 39 (H) 08/17/2017   LDLCALC 100 (H) 04/16/2021   LDLCALC 94  02/04/2021   Lab Results  Component Value Date   TSH 1.538 05/07/2021   TSH 2.490 04/16/2021    Therapeutic Level Labs: Lab Results  Component Value Date   LITHIUM 0.66 05/07/2021   LITHIUM 0.72 04/22/2020   No results found for: VALPROATE No components found for:  CBMZ  Current Medications: Current Outpatient Medications  Medication Sig Dispense Refill   atorvastatin (LIPITOR) 40 MG tablet Take 1 tablet (40  mg total) by mouth daily. 90 tablet 3   busPIRone (BUSPAR) 15 MG tablet Take 1 tablet (15 mg total) by mouth 3 (three) times daily. 270 tablet 1   Calcium Citrate-Vitamin D (CALCIUM + D PO) Take 1 tablet by mouth 2 (two) times daily.     fexofenadine (ALLERGY RELIEF) 180 MG tablet TAKE 1 TABLET(180 MG) BY MOUTH DAILY 90 tablet 4   fluticasone (FLONASE) 50 MCG/ACT nasal spray SHAKE LIQUID AND USE 2 SPRAYS IN EACH NOSTRIL DAILY 16 g 2   gabapentin (NEURONTIN) 300 MG capsule Take 1 capsule (300 mg total) by mouth 3 (three) times daily. 270 capsule 1   lithium 300 MG tablet Take 1 tablet (300 mg total) by mouth in the morning and at bedtime. 180 tablet 0   metoprolol succinate (TOPROL-XL) 25 MG 24 hr tablet Take 1 tablet (25 mg total) by mouth daily. 90 tablet 4   montelukast (SINGULAIR) 10 MG tablet TAKE 1 TABLET(10 MG) BY MOUTH AT BEDTIME 90 tablet 1   Multiple Vitamin (MULTIVITAMIN) tablet Take 1 tablet by mouth daily.     Omega-3 Fatty Acids (FISH OIL) 1000 MG CAPS Take 1,000 mg by mouth 2 (two) times daily.     omeprazole (PRILOSEC) 40 MG capsule Take 1 capsule (40 mg total) by mouth daily. 90 capsule 4   oxybutynin (DITROPAN-XL) 10 MG 24 hr tablet Take 2 tablets (20 mg total) by mouth at bedtime. 180 tablet 4   QUEtiapine (SEROQUEL) 100 MG tablet Take 1 tablet (100 mg total) by mouth at bedtime. 90 tablet 0   trazodone (DESYREL) 300 MG tablet Take 1 tablet (300 mg total) by mouth at bedtime. 90 tablet 1   triamcinolone (KENALOG) 0.1 % Apply 1 application topically 2 (two)  times daily. 45 each 0   trospium (SANCTURA) 20 MG tablet      valACYclovir (VALTREX) 1000 MG tablet Take 1 tablet (1,000 mg total) by mouth 2 (two) times daily. 20 tablet 0   venlafaxine XR (EFFEXOR XR) 75 MG 24 hr capsule Take 3 capsules (225 mg total) by mouth daily. 270 capsule 1   No current facility-administered medications for this visit.     Musculoskeletal: Strength & Muscle Tone: within normal limits Gait & Station: normal Patient leans: N/A  Psychiatric Specialty Exam: Review of Systems  Psychiatric/Behavioral:  Negative for agitation, behavioral problems, confusion, decreased concentration, dysphoric mood, hallucinations, self-injury, sleep disturbance and suicidal ideas. The patient is not nervous/anxious and is not hyperactive.   All other systems reviewed and are negative.  Blood pressure (!) 168/90, pulse 79, temperature 97.6 F (36.4 C), temperature source Temporal, weight 214 lb 12.8 oz (97.4 kg).Body mass index is 36.23 kg/m.  General Appearance: Fairly Groomed  Eye Contact:  Good  Speech:  Clear and Coherent  Volume:  Normal  Mood:   good  Affect:  Appropriate, Congruent, and euthymic  Thought Process:  Coherent  Orientation:  Full (Time, Place, and Person)  Thought Content: Logical   Suicidal Thoughts:  No  Homicidal Thoughts:  No  Memory:  Immediate;   Good  Judgement:  Good  Insight:  Good  Psychomotor Activity:  Normal, no rigidity  Concentration:  Concentration: Good and Attention Span: Good  Recall:  Good  Fund of Knowledge: Good  Language: Good  Akathisia:  No  Handed:  Right  AIMS (if indicated): 0  Assets:  Communication Skills Desire for Improvement  ADL's:  Intact  Cognition: WNL  Sleep:  Good  Screenings: GAD-7    Flowsheet Row Office Visit from 11/15/2020 in [facility] Visit from 04/01/2020 in [facility] Visit from 02/23/2019 in [facility]  Total GAD-7 Score 15 18 14        PHQ2-9    [place] Office Visit from 07/28/2021 in [facility] Office Visit from 06/11/2021 in [facility] Video Visit from 05/06/2021 in [facility] Office Visit from 04/16/2021 in [facility] from 04/07/2021 in [facility]  PHQ-2 Total Score 0 0 0 2 3  PHQ-9 Total Score -- 0 -- 6 4      Flowsheet Row Video Visit from 05/06/2021 in [facility] Video Visit from 03/25/2021 in [facility] No Risk No Risk        Assessment and Plan:  CITLALLY CAPTAIN is a 70 y.o. year old female with a history of bipolar I disorder, history of stage III CKD (resolved after ACI was discontinued per patient), hypertension, GERD, PONV, hyperlipidemia , who presents for follow up appointment for below.   1. Bipolar 1 disorder, depressed, partial remission ([place]) 2. Anxiety She denies significant mood symptoms despite loss of her mother in September.  She reports good relationship with her husband, engages well with church activities, and enjoys meeting with her friends.  She lowered lithium with the thought that it was discussed at the previous visit.  Will continue current dose of lithium and quetiapine to target bipolar disorder.  Will continue venlafaxine to target depression and anxiety.  Will continue BuSpar and gabapentin to target anxiety.   3. Insomnia, unspecified type She reports good benefit from trazodone.  Will continue current dose to target insomnia.   # HTN She was advised to contact her PCP for further evaluation/treatment.    Plan Continue lithium 300 mg once a day- monitor dry mouth Continue venlafaxine 225 mg daily Continue quetiapine 100 mg at night Continue Buspar 15 mg three times a day, Continue gabapentin 300 mg three times a day Continue trazodone 300 mg at night as needed for sleep Next  appointment- 2/7 at 2 PM for 30 mins, video Obtain labs (TSH, lithium)     The patient demonstrates the following risk factors for suicide: Chronic risk factors for suicide include: psychiatric disorder of bipolar disorder and previous suicide attempts of cutting, putting cord around her neck, overdose on medication . Acute risk factors for suicide include: unemployment. Protective factors for this patient include: positive social support, coping skills, and hope for the future. Considering these factors, the overall suicide risk at this point appears to be low. Patient is appropriate for outpatient follow up.       [name], MD 07/28/2021, 3:05 PM

## 2021-07-28 ENCOUNTER — Ambulatory Visit (INDEPENDENT_AMBULATORY_CARE_PROVIDER_SITE_OTHER): Payer: Medicare PPO | Admitting: Psychiatry

## 2021-07-28 ENCOUNTER — Other Ambulatory Visit: Payer: Self-pay

## 2021-07-28 ENCOUNTER — Encounter: Payer: Self-pay | Admitting: Psychiatry

## 2021-07-28 VITALS — BP 168/90 | HR 79 | Temp 97.6°F | Wt 214.8 lb

## 2021-07-28 DIAGNOSIS — F419 Anxiety disorder, unspecified: Secondary | ICD-10-CM

## 2021-07-28 DIAGNOSIS — F3175 Bipolar disorder, in partial remission, most recent episode depressed: Secondary | ICD-10-CM

## 2021-07-28 DIAGNOSIS — G4700 Insomnia, unspecified: Secondary | ICD-10-CM

## 2021-07-28 MED ORDER — LITHIUM CARBONATE 300 MG PO TABS: 300.0000 mg | ORAL_TABLET | Freq: Every day | ORAL | 0 refills | Status: DC

## 2021-07-28 MED ORDER — QUETIAPINE FUMARATE 100 MG PO TABS: 100.0000 mg | ORAL_TABLET | Freq: Every day | ORAL | 1 refills | Status: DC

## 2021-07-28 NOTE — Patient Instructions (Addendum)
Continue lithium 300 mg once a day Continue venlafaxine 225 mg daily Continue quetiapine 100 mg at night Continue Buspar 15 mg three times a da Continue gabapentin 300 mg three times a day Continue trazodone 300 mg at night as needed for sleep Next appointment- 2/7 at 2 PM

## 2021-07-30 ENCOUNTER — Ambulatory Visit: Payer: Medicare PPO | Admitting: Internal Medicine

## 2021-08-18 ENCOUNTER — Other Ambulatory Visit: Payer: Self-pay | Admitting: Internal Medicine

## 2021-08-18 NOTE — Telephone Encounter (Signed)
Requested Prescriptions  Pending Prescriptions Disp Refills  . montelukast (SINGULAIR) 10 MG tablet [Pharmacy Med Name: MONTELUKAST 10MG  TABLETS] 90 tablet 0    Sig: TAKE 1 TABLET(10 MG) BY MOUTH AT BEDTIME     Pulmonology:  Leukotriene Inhibitors Passed - 08/18/2021  7:15 AM      Passed - Valid encounter within last 12 months    Recent Outpatient Visits          2 months ago Need for influenza vaccination   [place] Vigg, Avanti, MD   4 months ago [place] Vigg, Avanti, MD   4 months ago Chronic pain of left knee   [facility] Vigg, Avanti, MD   6 months ago Urinary frequency   [facility] Vigg, Avanti, MD   6 months ago Hypertension, unspecified type   [place], MD      Future Appointments            In 3 weeks Vigg, Avanti, MD [facility]   In 7 months  [employer], [place]

## 2021-08-19 DIAGNOSIS — D2261 Melanocytic nevi of right upper limb, including shoulder: Secondary | ICD-10-CM | POA: Diagnosis not present

## 2021-08-19 DIAGNOSIS — L570 Actinic keratosis: Secondary | ICD-10-CM | POA: Diagnosis not present

## 2021-08-19 DIAGNOSIS — X32XXXA Exposure to sunlight, initial encounter: Secondary | ICD-10-CM | POA: Diagnosis not present

## 2021-08-19 DIAGNOSIS — D2271 Melanocytic nevi of right lower limb, including hip: Secondary | ICD-10-CM | POA: Diagnosis not present

## 2021-08-19 DIAGNOSIS — D2262 Melanocytic nevi of left upper limb, including shoulder: Secondary | ICD-10-CM | POA: Diagnosis not present

## 2021-08-19 DIAGNOSIS — L820 Inflamed seborrheic keratosis: Secondary | ICD-10-CM | POA: Diagnosis not present

## 2021-08-19 DIAGNOSIS — L439 Lichen planus, unspecified: Secondary | ICD-10-CM | POA: Diagnosis not present

## 2021-08-19 DIAGNOSIS — L538 Other specified erythematous conditions: Secondary | ICD-10-CM | POA: Diagnosis not present

## 2021-08-19 DIAGNOSIS — D225 Melanocytic nevi of trunk: Secondary | ICD-10-CM | POA: Diagnosis not present

## 2021-08-19 DIAGNOSIS — D485 Neoplasm of uncertain behavior of skin: Secondary | ICD-10-CM | POA: Diagnosis not present

## 2021-09-09 ENCOUNTER — Other Ambulatory Visit: Payer: Self-pay

## 2021-09-09 ENCOUNTER — Ambulatory Visit: Payer: Medicare PPO | Admitting: Internal Medicine

## 2021-09-09 ENCOUNTER — Encounter: Payer: Self-pay | Admitting: Internal Medicine

## 2021-09-09 VITALS — BP 152/69 | HR 65 | Temp 98.1°F | Ht 64.6 in | Wt 222.0 lb

## 2021-09-09 DIAGNOSIS — N760 Acute vaginitis: Secondary | ICD-10-CM | POA: Insufficient documentation

## 2021-09-09 DIAGNOSIS — N3941 Urge incontinence: Secondary | ICD-10-CM | POA: Diagnosis not present

## 2021-09-09 DIAGNOSIS — B9689 Other specified bacterial agents as the cause of diseases classified elsewhere: Secondary | ICD-10-CM | POA: Insufficient documentation

## 2021-09-09 DIAGNOSIS — E785 Hyperlipidemia, unspecified: Secondary | ICD-10-CM | POA: Diagnosis not present

## 2021-09-09 LAB — URINALYSIS, ROUTINE W REFLEX MICROSCOPIC
Bilirubin, UA: NEGATIVE
Glucose, UA: NEGATIVE
Ketones, UA: NEGATIVE
Nitrite, UA: NEGATIVE
Protein,UA: NEGATIVE
Specific Gravity, UA: 1.015 (ref 1.005–1.030)
Urobilinogen, Ur: 1 mg/dL (ref 0.2–1.0)
pH, UA: 6 (ref 5.0–7.5)

## 2021-09-09 LAB — MICROSCOPIC EXAMINATION

## 2021-09-09 LAB — WET PREP FOR TRICH, YEAST, CLUE
Clue Cell Exam: POSITIVE — AB
Trichomonas Exam: NEGATIVE
Yeast Exam: NEGATIVE

## 2021-09-09 MED ORDER — BENAZEPRIL HCL 5 MG PO TABS: 5.0000 mg | ORAL_TABLET | Freq: Every day | ORAL | 5 refills | Status: DC

## 2021-09-09 MED ORDER — METRONIDAZOLE 500 MG PO TABS: 500.0000 mg | ORAL_TABLET | Freq: Two times a day (BID) | ORAL | 0 refills | Status: AC

## 2021-09-09 NOTE — Patient Instructions (Signed)

## 2021-09-09 NOTE — Progress Notes (Signed)
BP (!) 152/69    Pulse 65    Temp 98.1 F (36.7 C) (Oral)    Ht 5' 4.6" (1.641 m)    Wt 222 lb (100.7 kg)    SpO2 98%    BMI 37.40 kg/m    Subjective:    Patient ID: Elizabeth Malone, female    DOB: 04/30/1951, 70 y.o.   MRN: [id number]  Chief Complaint  Patient presents with   Hyperlipidemia    HPI: Elizabeth Malone is a 70 y.o. female  Hyperlipidemia This is a chronic problem. The problem is uncontrolled.  Urinary Frequency  This is a chronic (urge incontinence is on oxybutynin for such) problem. The problem has been gradually improving. The quality of the pain is described as burning. The pain is at a severity of 4/10. Associated symptoms include a discharge, frequency and urgency. Pertinent negatives include no chills, flank pain, hematuria, hesitancy, nausea, possible pregnancy, sweats or vomiting.  Vaginal Discharge The patient's primary symptoms include genital itching and vaginal discharge. The patient's pertinent negatives include no genital lesions, genital odor, genital rash, missed menses, pelvic pain or vaginal bleeding. This is a new problem. The current episode started 1 to 4 weeks ago. Associated symptoms include frequency and urgency. Pertinent negatives include no abdominal pain, anorexia, back pain, chills, dysuria, fever, flank pain, headaches, hematuria, joint pain, joint swelling, nausea, painful intercourse, rash, sore throat or vomiting. The vaginal discharge was green.   Chief Complaint  Patient presents with   Hyperlipidemia    Relevant past medical, surgical, family and social history reviewed and updated as indicated. Interim medical history since our last visit reviewed. Allergies and medications reviewed and updated.  Review of Systems  Constitutional:  Negative for chills and fever.  HENT:  Negative for sore throat.   Gastrointestinal:  Negative for abdominal pain, anorexia, nausea and vomiting.  Genitourinary:  Positive for frequency, urgency and  vaginal discharge. Negative for dysuria, flank pain, hematuria, hesitancy, missed menses and pelvic pain.  Musculoskeletal:  Negative for back pain and joint pain.  Skin:  Negative for rash.  Neurological:  Negative for headaches.   Per HPI unless specifically indicated above     Objective:    BP (!) 152/69    Pulse 65    Temp 98.1 F (36.7 C) (Oral)    Ht 5' 4.6" (1.641 m)    Wt 222 lb (100.7 kg)    SpO2 98%    BMI 37.40 kg/m   Wt Readings from Last 3 Encounters:  09/09/21 222 lb (100.7 kg)  06/11/21 214 lb 6.4 oz (97.3 kg)  04/16/21 215 lb 3.2 oz (97.6 kg)    Physical Exam Vitals and nursing note reviewed.  Constitutional:      General: She is not in acute distress.    Appearance: Normal appearance. She is not ill-appearing or diaphoretic.  Eyes:     Conjunctiva/sclera: Conjunctivae normal.  Cardiovascular:     Rate and Rhythm: Normal rate and regular rhythm.     Heart sounds: No murmur heard. Pulmonary:     Breath sounds: No rhonchi.  Abdominal:     General: Abdomen is flat. Bowel sounds are normal. There is no distension.     Palpations: Abdomen is soft. There is no mass.     Tenderness: There is no abdominal tenderness. There is no guarding.  Skin:    General: Skin is warm and dry.     Coloration: Skin is not jaundiced.  Findings: No erythema.  Neurological:     Mental Status: She is alert.    Results for orders placed or performed during the hospital encounter of 05/07/21  Lithium level  Result Value Ref Range   Lithium Lvl 0.66 0.60 - 1.20 mmol/L  TSH  Result Value Ref Range   TSH 1.538 0.350 - 4.500 uIU/mL        Current Outpatient Medications:    atorvastatin (LIPITOR) 40 MG tablet, Take 1 tablet (40 mg total) by mouth daily., Disp: 90 tablet, Rfl: 3   busPIRone (BUSPAR) 15 MG tablet, Take 1 tablet (15 mg total) by mouth 3 (three) times daily., Disp: 270 tablet, Rfl: 1   Calcium Citrate-Vitamin D (CALCIUM + D PO), Take 1 tablet by mouth 2 (two)  times daily., Disp: , Rfl:    fexofenadine (ALLERGY RELIEF) 180 MG tablet, TAKE 1 TABLET(180 MG) BY MOUTH DAILY, Disp: 90 tablet, Rfl: 4   fluticasone (FLONASE) 50 MCG/ACT nasal spray, SHAKE LIQUID AND USE 2 SPRAYS IN EACH NOSTRIL DAILY, Disp: 16 g, Rfl: 2   gabapentin (NEURONTIN) 300 MG capsule, Take 1 capsule (300 mg total) by mouth 3 (three) times daily., Disp: 270 capsule, Rfl: 1   lithium 300 MG tablet, Take 1 tablet (300 mg total) by mouth at bedtime., Disp: 90 tablet, Rfl: 0   metoprolol succinate (TOPROL-XL) 25 MG 24 hr tablet, Take 1 tablet (25 mg total) by mouth daily., Disp: 90 tablet, Rfl: 4   montelukast (SINGULAIR) 10 MG tablet, TAKE 1 TABLET(10 MG) BY MOUTH AT BEDTIME, Disp: 90 tablet, Rfl: 0   Omega-3 Fatty Acids (FISH OIL) 1000 MG CAPS, Take 1,000 mg by mouth 2 (two) times daily., Disp: , Rfl:    omeprazole (PRILOSEC) 40 MG capsule, Take 1 capsule (40 mg total) by mouth daily., Disp: 90 capsule, Rfl: 4   oxybutynin (DITROPAN-XL) 10 MG 24 hr tablet, Take 2 tablets (20 mg total) by mouth at bedtime., Disp: 180 tablet, Rfl: 4   QUEtiapine (SEROQUEL) 100 MG tablet, Take 1 tablet (100 mg total) by mouth at bedtime., Disp: 90 tablet, Rfl: 1   trazodone (DESYREL) 300 MG tablet, Take 1 tablet (300 mg total) by mouth at bedtime., Disp: 90 tablet, Rfl: 1   valACYclovir (VALTREX) 1000 MG tablet, Take 1 tablet (1,000 mg total) by mouth 2 (two) times daily., Disp: 20 tablet, Rfl: 0   venlafaxine XR (EFFEXOR XR) 75 MG 24 hr capsule, Take 3 capsules (225 mg total) by mouth daily., Disp: 270 capsule, Rfl: 1   Multiple Vitamin (MULTIVITAMIN) tablet, Take 1 tablet by mouth daily. (Patient not taking: Reported on 09/09/2021), Disp: , Rfl:    trospium (SANCTURA) 20 MG tablet, , Disp: , Rfl:     Assessment & Plan:    HLD : is on lipitor 80 mg.  recheck FLP, check LFT's work on diet, SE of meds explained to pt. low fat and high fiber diet explained to pt.   Rt shoulder pain better  now has had injections in sept last for such   Urinary incontinence/ vaginal odor with discharge  Is milky white. Check wet prep/ UA  HTN was on lisinopril  Continue current meds.  Medication compliance emphasised. pt advised to keep Bp logs. Pt verbalised understanding of the same. Pt to have a low salt diet . Exercise to reach a goal of at least 150 mins a week.  lifestyle modifications explained and pt understands importance of the above.    Problem List Items  Addressed This Visit   None Visit Diagnoses     Hyperlipidemia, unspecified hyperlipidemia type    -  Primary   Relevant Orders   CBC with Differential/Platelet   Comprehensive metabolic panel   Lipid panel   Urinalysis, Routine w reflex microscopic        Orders Placed This Encounter  Procedures   CBC with Differential/Platelet   Comprehensive metabolic panel   Lipid panel   Urinalysis, Routine w reflex microscopic     No orders of the defined types were placed in this encounter.    Follow up plan: No follow-ups on file.

## 2021-09-18 ENCOUNTER — Other Ambulatory Visit: Payer: Medicare PPO

## 2021-09-18 ENCOUNTER — Other Ambulatory Visit: Payer: Self-pay

## 2021-09-18 DIAGNOSIS — E785 Hyperlipidemia, unspecified: Secondary | ICD-10-CM | POA: Diagnosis not present

## 2021-09-18 DIAGNOSIS — M2560 Stiffness of unspecified joint, not elsewhere classified: Secondary | ICD-10-CM

## 2021-09-19 LAB — CBC WITH DIFFERENTIAL/PLATELET
Basophils Absolute: 0.1 10*3/uL (ref 0.0–0.2)
Basos: 1 %
EOS (ABSOLUTE): 0.2 10*3/uL (ref 0.0–0.4)
Eos: 2 %
Hematocrit: 43 % (ref 34.0–46.6)
Hemoglobin: 14.5 g/dL (ref 11.1–15.9)
Immature Grans (Abs): 0 10*3/uL (ref 0.0–0.1)
Immature Granulocytes: 0 %
Lymphocytes Absolute: 1.7 10*3/uL (ref 0.7–3.1)
Lymphs: 24 %
MCH: 30.9 pg (ref 26.6–33.0)
MCHC: 33.7 g/dL (ref 31.5–35.7)
MCV: 92 fL (ref 79–97)
Monocytes Absolute: 0.5 10*3/uL (ref 0.1–0.9)
Monocytes: 8 %
Neutrophils Absolute: 4.7 10*3/uL (ref 1.4–7.0)
Neutrophils: 65 %
Platelets: 286 10*3/uL (ref 150–450)
RBC: 4.7 x10E6/uL (ref 3.77–5.28)
RDW: 12.3 % (ref 11.7–15.4)
WBC: 7.2 10*3/uL (ref 3.4–10.8)

## 2021-09-19 LAB — LIPID PANEL
Chol/HDL Ratio: 3.8 ratio (ref 0.0–4.4)
Cholesterol, Total: 166 mg/dL (ref 100–199)
HDL: 44 mg/dL (ref >39)
LDL Chol Calc (NIH): 88 mg/dL (ref 0–99)
Triglycerides: 203 mg/dL — ABNORMAL HIGH (ref 0–149)
VLDL Cholesterol Cal: 34 mg/dL (ref 5–40)

## 2021-09-19 LAB — COMPREHENSIVE METABOLIC PANEL
ALT: 35 IU/L — ABNORMAL HIGH (ref 0–32)
AST: 22 IU/L (ref 0–40)
Albumin/Globulin Ratio: 1.6 (ref 1.2–2.2)
Albumin: 4.4 g/dL (ref 3.8–4.8)
Alkaline Phosphatase: 124 IU/L — ABNORMAL HIGH (ref 44–121)
BUN/Creatinine Ratio: 11 — ABNORMAL LOW (ref 12–28)
BUN: 9 mg/dL (ref 8–27)
Bilirubin Total: 0.3 mg/dL (ref 0.0–1.2)
CO2: 27 mmol/L (ref 20–29)
Calcium: 10.1 mg/dL (ref 8.7–10.3)
Chloride: 102 mmol/L (ref 96–106)
Creatinine, Ser: 0.82 mg/dL (ref 0.57–1.00)
Globulin, Total: 2.8 g/dL (ref 1.5–4.5)
Glucose: 104 mg/dL — ABNORMAL HIGH (ref 70–99)
Potassium: 4.7 mmol/L (ref 3.5–5.2)
Sodium: 142 mmol/L (ref 134–144)
Total Protein: 7.2 g/dL (ref 6.0–8.5)
eGFR: 77 mL/min/{1.73_m2} (ref >59)

## 2021-09-23 ENCOUNTER — Other Ambulatory Visit: Payer: Self-pay | Admitting: Internal Medicine

## 2021-09-23 ENCOUNTER — Ambulatory Visit: Payer: Medicare PPO | Admitting: Internal Medicine

## 2021-09-23 ENCOUNTER — Encounter: Payer: Self-pay | Admitting: Internal Medicine

## 2021-09-23 ENCOUNTER — Other Ambulatory Visit: Payer: Self-pay

## 2021-09-23 VITALS — BP 127/69 | HR 89 | Temp 98.4°F | Ht 64.57 in | Wt 215.8 lb

## 2021-09-23 DIAGNOSIS — I10 Essential (primary) hypertension: Secondary | ICD-10-CM

## 2021-09-23 DIAGNOSIS — E7800 Pure hypercholesterolemia, unspecified: Secondary | ICD-10-CM | POA: Diagnosis not present

## 2021-09-23 DIAGNOSIS — Z1239 Encounter for other screening for malignant neoplasm of breast: Secondary | ICD-10-CM | POA: Diagnosis not present

## 2021-09-23 DIAGNOSIS — Z1231 Encounter for screening mammogram for malignant neoplasm of breast: Secondary | ICD-10-CM

## 2021-09-23 NOTE — Patient Instructions (Addendum)
Latest Reference Range & Units 09/18/21 10:27  COMPREHENSIVE METABOLIC PANEL  Rpt !  Sodium 134 - 144 mmol/L 142  Potassium 3.5 - 5.2 mmol/L 4.7  Chloride 96 - 106 mmol/L 102  CO2 20 - 29 mmol/L 27  Glucose 70 - 99 mg/dL 104 (H)  BUN 8 - 27 mg/dL 9  Creatinine 0.57 - 1.00 mg/dL 0.82  Calcium 8.7 - 10.3 mg/dL 10.1  BUN/Creatinine Ratio 12 - 28  11 (L)  eGFR >59 mL/min/1.73 77  Alkaline Phosphatase 44 - 121 IU/L 124 (H)  Albumin 3.8 - 4.8 g/dL 4.4  Albumin/Globulin Ratio 1.2 - 2.2  1.6  AST 0 - 40 IU/L 22  ALT 0 - 32 IU/L 35 (H)  Total Protein 6.0 - 8.5 g/dL 7.2  Total Bilirubin 0.0 - 1.2 mg/dL 0.3  Total CHOL/HDL Ratio 0.0 - 4.4 ratio 3.8  Cholesterol, Total 100 - 199 mg/dL 166  HDL Cholesterol >39 mg/dL 44  Triglycerides 0 - 149 mg/dL 203 (H)  VLDL Cholesterol Cal 5 - 40 mg/dL 34  LDL Chol Calc (NIH) 0 - 99 mg/dL 88  Globulin, Total 1.5 - 4.5 g/dL 2.8  WBC 3.4 - 10.8 x10E3/uL 7.2  RBC 3.77 - 5.28 x10E6/uL 4.70  Hemoglobin 11.1 - 15.9 g/dL 14.5  HCT 34.0 - 46.6 % 43.0  MCV 79 - 97 fL 92  MCH 26.6 - 33.0 pg 30.9  MCHC 31.5 - 35.7 g/dL 33.7  RDW 11.7 - 15.4 % 12.3  Platelets 150 - 450 x10E3/uL 286  Neutrophils Not Estab. % 65  Immature Granulocytes Not Estab. % 0  NEUT# 1.4 - 7.0 x10E3/uL 4.7  Lymphocyte # 0.7 - 3.1 x10E3/uL 1.7  Monocytes Absolute 0.1 - 0.9 x10E3/uL 0.5  Basophils Absolute 0.0 - 0.2 x10E3/uL 0.1  Immature Grans (Abs) 0.0 - 0.1 x10E3/uL 0.0  Lymphs Not Estab. % 24  Monocytes Not Estab. % 8  Basos Not Estab. % 1  Eos Not Estab. % 2  EOS (ABSOLUTE) 0.0 - 0.4 x10E3/uL 0.2  !: Data is abnormal (H): Data is abnormally high (L): Data is abnormally low Rpt: View report in Results Review for more information   Please call to schedule your mammogram and/or bone density: [facility]  Address: [address], [place], [place] [address]  Phone: [phone]

## 2021-09-23 NOTE — Progress Notes (Signed)
BP 127/69    Pulse 89    Temp 98.4 F (36.9 C) (Oral)    Ht 5' 4.57" (1.64 m)    Wt 215 lb 12.8 oz (97.9 kg)    SpO2 98%    BMI 36.39 kg/m    Subjective:    Patient ID: Elizabeth Malone, female    DOB: 11-Jun-1951, 71 y.o.   MRN: [id number]  Chief Complaint  Patient presents with   Results    HPI: Elizabeth Malone is a 71 y.o. female  ALT  HIGH  Hypertension This is a chronic problem. The problem is uncontrolled. Pertinent negatives include no anxiety, blurred vision, chest pain, headaches, malaise/fatigue, neck pain, orthopnea, palpitations, peripheral edema, PND, shortness of breath or sweats.  Hyperlipidemia This is a chronic problem. Pertinent negatives include no chest pain or shortness of breath.   Chief Complaint  Patient presents with   Results    Relevant past medical, surgical, family and social history reviewed and updated as indicated. Interim medical history since our last visit reviewed. Allergies and medications reviewed and updated.  Review of Systems  Constitutional:  Negative for malaise/fatigue.  Eyes:  Negative for blurred vision.  Respiratory:  Negative for shortness of breath.   Cardiovascular:  Negative for chest pain, palpitations, orthopnea and PND.  Musculoskeletal:  Negative for neck pain.  Neurological:  Negative for headaches.   Per HPI unless specifically indicated above     Objective:    BP 127/69    Pulse 89    Temp 98.4 F (36.9 C) (Oral)    Ht 5' 4.57" (1.64 m)    Wt 215 lb 12.8 oz (97.9 kg)    SpO2 98%    BMI 36.39 kg/m   Wt Readings from Last 3 Encounters:  09/23/21 215 lb 12.8 oz (97.9 kg)  09/09/21 222 lb (100.7 kg)  06/11/21 214 lb 6.4 oz (97.3 kg)    Physical Exam Vitals and nursing note reviewed.  Constitutional:      General: She is not in acute distress.    Appearance: Normal appearance. She is not ill-appearing or diaphoretic.  Eyes:     Conjunctiva/sclera: Conjunctivae normal.  Cardiovascular:     Rate and Rhythm:  Normal rate.  Pulmonary:     Breath sounds: No rhonchi.  Abdominal:     General: Abdomen is flat. Bowel sounds are normal. There is no distension.     Palpations: Abdomen is soft. There is no mass.     Tenderness: There is no abdominal tenderness. There is no guarding.  Skin:    General: Skin is warm and dry.     Coloration: Skin is not jaundiced.     Findings: No erythema.  Neurological:     Mental Status: She is alert.    Results for orders placed or performed in visit on 09/18/21  Lipid panel  Result Value Ref Range   Cholesterol, Total 166 100 - 199 mg/dL   Triglycerides 203 (H) 0 - 149 mg/dL   HDL 44 >39 mg/dL   VLDL Cholesterol Cal 34 5 - 40 mg/dL   LDL Chol Calc (NIH) 88 0 - 99 mg/dL   Chol/HDL Ratio 3.8 0.0 - 4.4 ratio  Comprehensive metabolic panel  Result Value Ref Range   Glucose 104 (H) 70 - 99 mg/dL   BUN 9 8 - 27 mg/dL   Creatinine, Ser 0.82 0.57 - 1.00 mg/dL   eGFR 77 >59 mL/min/1.73   BUN/Creatinine Ratio 11 (L)  12 - 28   Sodium 142 134 - 144 mmol/L   Potassium 4.7 3.5 - 5.2 mmol/L   Chloride 102 96 - 106 mmol/L   CO2 27 20 - 29 mmol/L   Calcium 10.1 8.7 - 10.3 mg/dL   Total Protein 7.2 6.0 - 8.5 g/dL   Albumin 4.4 3.8 - 4.8 g/dL   Globulin, Total 2.8 1.5 - 4.5 g/dL   Albumin/Globulin Ratio 1.6 1.2 - 2.2   Bilirubin Total 0.3 0.0 - 1.2 mg/dL   Alkaline Phosphatase 124 (H) 44 - 121 IU/L   AST 22 0 - 40 IU/L   ALT 35 (H) 0 - 32 IU/L  CBC with Differential/Platelet  Result Value Ref Range   WBC 7.2 3.4 - 10.8 x10E3/uL   RBC 4.70 3.77 - 5.28 x10E6/uL   Hemoglobin 14.5 11.1 - 15.9 g/dL   Hematocrit 43.0 34.0 - 46.6 %   MCV 92 79 - 97 fL   MCH 30.9 26.6 - 33.0 pg   MCHC 33.7 31.5 - 35.7 g/dL   RDW 12.3 11.7 - 15.4 %   Platelets 286 150 - 450 x10E3/uL   Neutrophils 65 Not Estab. %   Lymphs 24 Not Estab. %   Monocytes 8 Not Estab. %   Eos 2 Not Estab. %   Basos 1 Not Estab. %   Neutrophils Absolute 4.7 1.4 - 7.0 x10E3/uL   Lymphocytes Absolute 1.7  0.7 - 3.1 x10E3/uL   Monocytes Absolute 0.5 0.1 - 0.9 x10E3/uL   EOS (ABSOLUTE) 0.2 0.0 - 0.4 x10E3/uL   Basophils Absolute 0.1 0.0 - 0.2 x10E3/uL   Immature Granulocytes 0 Not Estab. %   Immature Grans (Abs) 0.0 0.0 - 0.1 x10E3/uL        Current Outpatient Medications:    atorvastatin (LIPITOR) 40 MG tablet, Take 1 tablet (40 mg total) by mouth daily., Disp: 90 tablet, Rfl: 3   benazepril (LOTENSIN) 5 MG tablet, Take 1 tablet (5 mg total) by mouth daily., Disp: 90 tablet, Rfl: 5   busPIRone (BUSPAR) 15 MG tablet, Take 1 tablet (15 mg total) by mouth 3 (three) times daily., Disp: 270 tablet, Rfl: 1   Calcium Citrate-Vitamin D (CALCIUM + D PO), Take 1 tablet by mouth 2 (two) times daily., Disp: , Rfl:    fexofenadine (ALLERGY RELIEF) 180 MG tablet, TAKE 1 TABLET(180 MG) BY MOUTH DAILY, Disp: 90 tablet, Rfl: 4   fluticasone (FLONASE) 50 MCG/ACT nasal spray, SHAKE LIQUID AND USE 2 SPRAYS IN EACH NOSTRIL DAILY, Disp: 16 g, Rfl: 2   gabapentin (NEURONTIN) 300 MG capsule, Take 1 capsule (300 mg total) by mouth 3 (three) times daily., Disp: 270 capsule, Rfl: 1   lithium 300 MG tablet, Take 1 tablet (300 mg total) by mouth at bedtime., Disp: 90 tablet, Rfl: 0   metoprolol succinate (TOPROL-XL) 25 MG 24 hr tablet, Take 1 tablet (25 mg total) by mouth daily., Disp: 90 tablet, Rfl: 4   montelukast (SINGULAIR) 10 MG tablet, TAKE 1 TABLET(10 MG) BY MOUTH AT BEDTIME, Disp: 90 tablet, Rfl: 0   Multiple Vitamin (MULTIVITAMIN) tablet, Take 1 tablet by mouth daily., Disp: , Rfl:    Omega-3 Fatty Acids (FISH OIL) 1000 MG CAPS, Take 1,000 mg by mouth 2 (two) times daily., Disp: , Rfl:    omeprazole (PRILOSEC) 40 MG capsule, Take 1 capsule (40 mg total) by mouth daily., Disp: 90 capsule, Rfl: 4   oxybutynin (DITROPAN-XL) 10 MG 24 hr tablet, Take 2 tablets (20 mg total) by  mouth at bedtime., Disp: 180 tablet, Rfl: 4   QUEtiapine (SEROQUEL) 100 MG tablet, Take 1 tablet (100 mg total) by mouth at  bedtime., Disp: 90 tablet, Rfl: 1   trazodone (DESYREL) 300 MG tablet, Take 1 tablet (300 mg total) by mouth at bedtime., Disp: 90 tablet, Rfl: 1   trospium (SANCTURA) 20 MG tablet, , Disp: , Rfl:    valACYclovir (VALTREX) 1000 MG tablet, Take 1 tablet (1,000 mg total) by mouth 2 (two) times daily., Disp: 20 tablet, Rfl: 0   venlafaxine XR (EFFEXOR XR) 75 MG 24 hr capsule, Take 3 capsules (225 mg total) by mouth daily., Disp: 270 capsule, Rfl: 1    Assessment & Plan:  bipolar disorder and anxiety.  Stable, Is on lithium 300 mg daily/ venlafaxine / quetiapine and Buspar  Insomnia is on trazadone as needed. Cut back on this sec to elevated  ALT - HIGH  Will need to d/w psychiatrist and see if she can cut back on some of her Psych meds   HLD :  recheck FLP, check LFT's work on diet, SE of meds explained to pt. low fat and high fiber diet explained to pt.  HTN is on metoprolol xl / lotensin restarted last visit has had some high numbers to call office if continues to have elevated bp's Continue current meds.  Medication compliance emphasised. pt advised to keep Bp logs. Pt verbalised understanding of the same. Pt to have a low salt diet . Exercise to reach a goal of at least 150 mins a week.  lifestyle modifications explained and pt understands importance of the above.   Problem List Items Addressed This Visit   None    No orders of the defined types were placed in this encounter.    No orders of the defined types were placed in this encounter.    Follow up plan: No follow-ups on file.

## 2021-09-29 ENCOUNTER — Ambulatory Visit: Payer: Self-pay | Admitting: *Deleted

## 2021-09-29 ENCOUNTER — Encounter: Payer: Self-pay | Admitting: Internal Medicine

## 2021-09-29 ENCOUNTER — Telehealth (INDEPENDENT_AMBULATORY_CARE_PROVIDER_SITE_OTHER): Payer: Medicare PPO | Admitting: Internal Medicine

## 2021-09-29 DIAGNOSIS — I10 Essential (primary) hypertension: Secondary | ICD-10-CM

## 2021-09-29 MED ORDER — METOPROLOL SUCCINATE ER 50 MG PO TB24: 50.0000 mg | ORAL_TABLET | Freq: Every day | ORAL | 4 refills | Status: DC

## 2021-09-29 MED ORDER — BENAZEPRIL HCL 10 MG PO TABS: 10.0000 mg | ORAL_TABLET | Freq: Every day | ORAL | 3 refills | Status: DC

## 2021-09-29 NOTE — Telephone Encounter (Signed)
°  Chief Complaint: call back to report elevated BP Symptoms: BP today 1050 167/98 now 174/94 P 73. Headache, not sleeping well, ear pain, possible abscess tooth.  Frequency: since yesterday  Pertinent Negatives: Patient denies blurred vision, no chest pain, difficulty breathing, no weakness on either side  Disposition: [] ED /[] Urgent Care (no appt availability in office) / [] Appointment(In office/virtual)/ []  [facility]/ [] Home Care/ [] Refused Recommended Disposition /[]  Mobile Bus/ [x]  Follow-up with PCP Additional Notes:  Patient last seen by PCP 09/23/21 and told to call back regarding BP. Please advise if medication to be adjusted.  Recommended to increase water intake      Reason for Disposition  [0] Systolic BP  >= 076 OR Diastolic >= 80 AND [2] taking BP medications  Answer Assessment - Initial Assessment Questions 1. BLOOD PRESSURE: "What is the blood pressure?" "Did you take at least two measurements 5 minutes apart?"     1050-167/98  now  174/94 P 73 2. ONSET: "When did you take your blood pressure?"     Now  3. HOW: "How did you obtain the blood pressure?" (e.g., visiting nurse, automatic home BP monitor)     Automatic home BP Monitor OMORI  4. HISTORY: "Do you have a history of high blood pressure?"     Yes 5. MEDICATIONS: "Are you taking any medications for blood pressure?" "Have you missed any doses recently?"     No started back on lotensin 6. OTHER SYMPTOMS: "Do you have any symptoms?" (e.g., headache, chest pain, blurred vision, difficulty breathing, weakness)     Headache , ear pain low back pain.  7. PREGNANCY: "Is there any chance you are pregnant?" "When was your last menstrual period?"     na  Protocols used: Blood Pressure - High-A-AH

## 2021-09-29 NOTE — Progress Notes (Signed)
BP (!) 174/93    Pulse 73    Subjective:    Patient ID: Elizabeth Malone, female    DOB: November 16, 1950, 71 y.o.   MRN: [id number]  Chief Complaint  Patient presents with   Hypertension    Patient calling about  about high BP reading today, at 7:30 it was 168/104, 8:00 it was 170/100, at 10:50 it was 167/98 ,then at 11:10 it was 174/93.  Patient did state that she ate a bag of popcorn    HPI: Elizabeth Malone is a 71 y.o. female  Patient presents with: Hypertension: Patient calling about  about high BP reading today, at 7:30 it was 168/104, 8:00 it was 170/100, at 10:50 it was 167/98 ,then at 11:10 it was 174/93.  Patient did state that she ate a bag of popcorn    Hypertension This is a chronic (is on 5 mg of benazpril) problem. The current episode started more than 1 year ago. The problem is controlled. Pertinent negatives include no anxiety, blurred vision, chest pain, headaches, malaise/fatigue, neck pain, orthopnea, palpitations, peripheral edema, PND, shortness of breath or sweats.   Chief Complaint  Patient presents with   Hypertension    Patient calling about  about high BP reading today, at 7:30 it was 168/104, 8:00 it was 170/100, at 10:50 it was 167/98 ,then at 11:10 it was 174/93.  Patient did state that she ate a bag of popcorn    Relevant past medical, surgical, family and social history reviewed and updated as indicated. Interim medical history since our last visit reviewed. Allergies and medications reviewed and updated.  Review of Systems  Constitutional:  Negative for malaise/fatigue.  Eyes:  Negative for blurred vision.  Respiratory:  Negative for shortness of breath.   Cardiovascular:  Negative for chest pain, palpitations, orthopnea and PND.  Musculoskeletal:  Negative for neck pain.  Neurological:  Negative for headaches.   Per HPI unless specifically indicated above     Objective:    BP (!) 174/93    Pulse 73   Wt Readings from Last 3 Encounters:   09/23/21 215 lb 12.8 oz (97.9 kg)  09/09/21 222 lb (100.7 kg)  06/11/21 214 lb 6.4 oz (97.3 kg)    Physical Exam Vitals and nursing note reviewed.  Constitutional:      General: She is not in acute distress.    Appearance: Normal appearance. She is not ill-appearing or diaphoretic.  Eyes:     Conjunctiva/sclera: Conjunctivae normal.  Cardiovascular:     Rate and Rhythm: Normal rate and regular rhythm.  Pulmonary:     Breath sounds: No rhonchi.  Abdominal:     General: Abdomen is flat. Bowel sounds are normal. There is no distension.     Palpations: Abdomen is soft. There is no mass.     Tenderness: There is no abdominal tenderness. There is no guarding.  Musculoskeletal:        General: No swelling, tenderness, deformity or signs of injury.     Right lower leg: No edema.     Left lower leg: No edema.  Skin:    General: Skin is warm and dry.     Coloration: Skin is not jaundiced.     Findings: No erythema.  Neurological:     Mental Status: She is alert.  Psychiatric:        Mood and Affect: Mood normal.        Behavior: Behavior normal.  Thought Content: Thought content normal.        Judgment: Judgment normal.    Results for orders placed or performed in visit on 09/18/21  Lipid panel  Result Value Ref Range   Cholesterol, Total 166 100 - 199 mg/dL   Triglycerides 203 (H) 0 - 149 mg/dL   HDL 44 >39 mg/dL   VLDL Cholesterol Cal 34 5 - 40 mg/dL   LDL Chol Calc (NIH) 88 0 - 99 mg/dL   Chol/HDL Ratio 3.8 0.0 - 4.4 ratio  Comprehensive metabolic panel  Result Value Ref Range   Glucose 104 (H) 70 - 99 mg/dL   BUN 9 8 - 27 mg/dL   Creatinine, Ser 0.82 0.57 - 1.00 mg/dL   eGFR 77 >59 mL/min/1.73   BUN/Creatinine Ratio 11 (L) 12 - 28   Sodium 142 134 - 144 mmol/L   Potassium 4.7 3.5 - 5.2 mmol/L   Chloride 102 96 - 106 mmol/L   CO2 27 20 - 29 mmol/L   Calcium 10.1 8.7 - 10.3 mg/dL   Total Protein 7.2 6.0 - 8.5 g/dL   Albumin 4.4 3.8 - 4.8 g/dL   Globulin,  Total 2.8 1.5 - 4.5 g/dL   Albumin/Globulin Ratio 1.6 1.2 - 2.2   Bilirubin Total 0.3 0.0 - 1.2 mg/dL   Alkaline Phosphatase 124 (H) 44 - 121 IU/L   AST 22 0 - 40 IU/L   ALT 35 (H) 0 - 32 IU/L  CBC with Differential/Platelet  Result Value Ref Range   WBC 7.2 3.4 - 10.8 x10E3/uL   RBC 4.70 3.77 - 5.28 x10E6/uL   Hemoglobin 14.5 11.1 - 15.9 g/dL   Hematocrit 43.0 34.0 - 46.6 %   MCV 92 79 - 97 fL   MCH 30.9 26.6 - 33.0 pg   MCHC 33.7 31.5 - 35.7 g/dL   RDW 12.3 11.7 - 15.4 %   Platelets 286 150 - 450 x10E3/uL   Neutrophils 65 Not Estab. %   Lymphs 24 Not Estab. %   Monocytes 8 Not Estab. %   Eos 2 Not Estab. %   Basos 1 Not Estab. %   Neutrophils Absolute 4.7 1.4 - 7.0 x10E3/uL   Lymphocytes Absolute 1.7 0.7 - 3.1 x10E3/uL   Monocytes Absolute 0.5 0.1 - 0.9 x10E3/uL   EOS (ABSOLUTE) 0.2 0.0 - 0.4 x10E3/uL   Basophils Absolute 0.1 0.0 - 0.2 x10E3/uL   Immature Granulocytes 0 Not Estab. %   Immature Grans (Abs) 0.0 0.0 - 0.1 x10E3/uL        Current Outpatient Medications:    atorvastatin (LIPITOR) 40 MG tablet, Take 1 tablet (40 mg total) by mouth daily., Disp: 90 tablet, Rfl: 3   busPIRone (BUSPAR) 15 MG tablet, Take 1 tablet (15 mg total) by mouth 3 (three) times daily., Disp: 270 tablet, Rfl: 1   Calcium Citrate-Vitamin D (CALCIUM + D PO), Take 1 tablet by mouth 2 (two) times daily., Disp: , Rfl:    fexofenadine (ALLERGY RELIEF) 180 MG tablet, TAKE 1 TABLET(180 MG) BY MOUTH DAILY, Disp: 90 tablet, Rfl: 4   fluticasone (FLONASE) 50 MCG/ACT nasal spray, SHAKE LIQUID AND USE 2 SPRAYS IN EACH NOSTRIL DAILY, Disp: 16 g, Rfl: 2   gabapentin (NEURONTIN) 300 MG capsule, Take 1 capsule (300 mg total) by mouth 3 (three) times daily., Disp: 270 capsule, Rfl: 1   lithium 300 MG tablet, Take 1 tablet (300 mg total) by mouth at bedtime., Disp: 90 tablet, Rfl: 0   montelukast (SINGULAIR)  10 MG tablet, TAKE 1 TABLET(10 MG) BY MOUTH AT BEDTIME, Disp: 90 tablet, Rfl: 0   Multiple  Vitamin (MULTIVITAMIN) tablet, Take 1 tablet by mouth daily., Disp: , Rfl:    Omega-3 Fatty Acids (FISH OIL) 1000 MG CAPS, Take 1,000 mg by mouth 2 (two) times daily., Disp: , Rfl:    omeprazole (PRILOSEC) 40 MG capsule, Take 1 capsule (40 mg total) by mouth daily., Disp: 90 capsule, Rfl: 4   oxybutynin (DITROPAN-XL) 10 MG 24 hr tablet, Take 2 tablets (20 mg total) by mouth at bedtime., Disp: 180 tablet, Rfl: 4   QUEtiapine (SEROQUEL) 100 MG tablet, Take 1 tablet (100 mg total) by mouth at bedtime., Disp: 90 tablet, Rfl: 1   trazodone (DESYREL) 300 MG tablet, Take 1 tablet (300 mg total) by mouth at bedtime., Disp: 90 tablet, Rfl: 1   valACYclovir (VALTREX) 1000 MG tablet, Take 1 tablet (1,000 mg total) by mouth 2 (two) times daily., Disp: 20 tablet, Rfl: 0   venlafaxine XR (EFFEXOR XR) 75 MG 24 hr capsule, Take 3 capsules (225 mg total) by mouth daily., Disp: 270 capsule, Rfl: 1   benazepril (LOTENSIN) 10 MG tablet, Take 1 tablet (10 mg total) by mouth daily., Disp: 30 tablet, Rfl: 3   metoprolol succinate (TOPROL-XL) 50 MG 24 hr tablet, Take 1 tablet (50 mg total) by mouth daily., Disp: 30 tablet, Rfl: 4    Assessment & Plan:  HTN increase to benzapril 10 and metoprolol 50 mg daily. HTN : needs to clarify with Nephrology who had her stop her ASA 81 mg  Recheck bp in 2 weeks  Continue current meds.  Medication compliance emphasised. pt advised to keep Bp logs. Pt verbalised understanding of the same. Pt to have a low salt diet . Exercise to reach a goal of at least 150 mins a week.  lifestyle modifications explained and pt understands importance of the above. Under good control on current regimen. Continue current regimen. Continue to monitor. Call with any concerns. Refills given. Labs drawn today.  Problem List Items Addressed This Visit       Cardiovascular and Mediastinum   Essential hypertension   Relevant Medications   benazepril (LOTENSIN) 10 MG tablet   metoprolol  succinate (TOPROL-XL) 50 MG 24 hr tablet     No orders of the defined types were placed in this encounter.    Meds ordered this encounter  Medications   benazepril (LOTENSIN) 10 MG tablet    Sig: Take 1 tablet (10 mg total) by mouth daily.    Dispense:  30 tablet    Refill:  3   metoprolol succinate (TOPROL-XL) 50 MG 24 hr tablet    Sig: Take 1 tablet (50 mg total) by mouth daily.    Dispense:  30 tablet    Refill:  4     Follow up plan: No follow-ups on file.

## 2021-09-29 NOTE — Telephone Encounter (Signed)
Pt scheduled with Dr. Neomia Dear

## 2021-10-13 ENCOUNTER — Telehealth: Payer: Self-pay | Admitting: Internal Medicine

## 2021-10-13 NOTE — Telephone Encounter (Signed)
Pt requesting a call back and asking if she needs to come in for a lipid blood panel before February 10th?  Pt stated she remembers PCP mentioning this but is unsure.

## 2021-10-13 NOTE — Telephone Encounter (Signed)
Lmom asking pt to call back to schedule a lab only appt. It does look like Dr. Neomia Dear wants her to come back on or before 2/10 to get her lipid panel.

## 2021-10-14 ENCOUNTER — Telehealth: Payer: Self-pay | Admitting: Internal Medicine

## 2021-10-14 NOTE — Telephone Encounter (Signed)
Copied from [place] [id number]. Topic: General - Inquiry >> Oct 14, 2021 11:46 AM [name] wrote: Patient would like if a nurse can ensure she is using her blood pressure monitor correctly when she comes in for labs on 10/23/2021, please advise patient if this is ok

## 2021-10-14 NOTE — Telephone Encounter (Signed)
Appt scheduled

## 2021-10-16 NOTE — Progress Notes (Deleted)
BH MD/PA/NP OP Progress Note  10/16/2021 11:03 AM SYNCERE KAMINSKI  MRN:  [id number]  Chief Complaint:  HPI: *** Lithium level,  Lft  Visit Diagnosis: No diagnosis found.  Past Psychiatric History: Please see initial evaluation for full details. I have reviewed the history. No updates at this time.     Past Medical History:  Past Medical History:  Diagnosis Date   Anxiety    Bipolar disorder ([place])    CKD (chronic kidney disease) stage 3, GFR 30-59 ml/min (HCC)    Complication of anesthesia    Depression    GERD (gastroesophageal reflux disease)    occasionally has to use tums   Headache    History of diverticulosis    Hyperlipidemia    Macular degeneration    Overactive bladder    PONV (postoperative nausea and vomiting)     Past Surgical History:  Procedure Laterality Date   COLONOSCOPY WITH PROPOFOL N/A 10/07/2017   Procedure: COLONOSCOPY WITH PROPOFOL;  Surgeon: [name], MD;  Location: [facility];  Service: Endoscopy;  Laterality: N/A;   FOOT SURGERY Bilateral    KNEE SURGERY Left    POLYPECTOMY  10/07/2017   Procedure: POLYPECTOMY;  Surgeon: [name], MD;  Location: [facility];  Service: Endoscopy;;   TONSILLECTOMY AND ADENOIDECTOMY      Family Psychiatric History: Please see initial evaluation for full details. I have reviewed the history. No updates at this time.     Family History:  Family History  Problem Relation Age of Onset   Alzheimer's disease Mother    Bipolar disorder Mother    Depression Mother    Heart attack Father    Diabetes Brother    Obesity Brother    Depression Daughter    Stroke Maternal Grandmother    Depression Maternal Grandfather    Stroke Paternal Grandmother    Heart attack Paternal Grandfather    Breast cancer Neg Hx     Social History:  Social History   Socioeconomic History   Marital status: Married    Spouse name: Not on file   Number of children: 2   Years of education: Not on file   Highest  education level: Bachelor's degree (e.g., BA, AB, BS)  Occupational History   Occupation: retired   Tobacco Use   Smoking status: Former    Types: Cigarettes    Start date: 07/09/1967    Quit date: 07/08/1985    Years since quitting: 36.2   Smokeless tobacco: Never  Vaping Use   Vaping Use: Never used  Substance and Sexual Activity   Alcohol use: Yes    Alcohol/week: 3.0 standard drinks    Types: 3 Shots of liquor per week    Comment: occassional   Drug use: No   Sexual activity: Not Currently  Other Topics Concern   Not on file  Social History Narrative   Not on file   Social Determinants of Health   Financial Resource Strain: Low Risk    Difficulty of Paying Living Expenses: Not hard at all  Food Insecurity: No Food Insecurity   Worried About Charity fundraiser in the Last Year: Never true   [place] in the Last Year: Never true  Transportation Needs: No Transportation Needs   Lack of Transportation (Medical): No   Lack of Transportation (Non-Medical): No  Physical Activity: Inactive   Days of Exercise per Week: 0 days   Minutes of Exercise per Session: 0 min  Stress: No Stress Concern Present   Feeling of Stress : Not at all  Social Connections: Not on file    Allergies:  Allergies  Allergen Reactions   Fluoxetine Other (See Comments)   Hydrocodone-Chlorpheniramine Other (See Comments)   Paroxetine Hcl Other (See Comments)   Penicillin G Benzathine Other (See Comments)   Penicillin V Potassium Other (See Comments)   Clindamycin Hcl Rash and Other (See Comments)    Katherina Right' syndrome   Lamotrigine Rash    Metabolic Disorder Labs: Lab Results  Component Value Date   HGBA1C 4.9 08/17/2017   No results found for: PROLACTIN Lab Results  Component Value Date   CHOL 166 09/18/2021   TRIG 203 (H) 09/18/2021   HDL 44 09/18/2021   CHOLHDL 3.8 09/18/2021   VLDL 39 (H) 08/17/2017   LDLCALC 88 09/18/2021   LDLCALC 100 (H) 04/16/2021   Lab  Results  Component Value Date   TSH 1.538 05/07/2021   TSH 2.490 04/16/2021    Therapeutic Level Labs: Lab Results  Component Value Date   LITHIUM 0.66 05/07/2021   LITHIUM 0.72 04/22/2020   No results found for: VALPROATE No components found for:  CBMZ  Current Medications: Current Outpatient Medications  Medication Sig Dispense Refill   atorvastatin (LIPITOR) 40 MG tablet Take 1 tablet (40 mg total) by mouth daily. 90 tablet 3   benazepril (LOTENSIN) 10 MG tablet Take 1 tablet (10 mg total) by mouth daily. 30 tablet 3   busPIRone (BUSPAR) 15 MG tablet Take 1 tablet (15 mg total) by mouth 3 (three) times daily. 270 tablet 1   Calcium Citrate-Vitamin D (CALCIUM + D PO) Take 1 tablet by mouth 2 (two) times daily.     fexofenadine (ALLERGY RELIEF) 180 MG tablet TAKE 1 TABLET(180 MG) BY MOUTH DAILY 90 tablet 4   fluticasone (FLONASE) 50 MCG/ACT nasal spray SHAKE LIQUID AND USE 2 SPRAYS IN EACH NOSTRIL DAILY 16 g 2   gabapentin (NEURONTIN) 300 MG capsule Take 1 capsule (300 mg total) by mouth 3 (three) times daily. 270 capsule 1   lithium 300 MG tablet Take 1 tablet (300 mg total) by mouth at bedtime. 90 tablet 0   metoprolol succinate (TOPROL-XL) 50 MG 24 hr tablet Take 1 tablet (50 mg total) by mouth daily. 30 tablet 4   montelukast (SINGULAIR) 10 MG tablet TAKE 1 TABLET(10 MG) BY MOUTH AT BEDTIME 90 tablet 0   Multiple Vitamin (MULTIVITAMIN) tablet Take 1 tablet by mouth daily.     Omega-3 Fatty Acids (FISH OIL) 1000 MG CAPS Take 1,000 mg by mouth 2 (two) times daily.     omeprazole (PRILOSEC) 40 MG capsule Take 1 capsule (40 mg total) by mouth daily. 90 capsule 4   oxybutynin (DITROPAN-XL) 10 MG 24 hr tablet Take 2 tablets (20 mg total) by mouth at bedtime. 180 tablet 4   QUEtiapine (SEROQUEL) 100 MG tablet Take 1 tablet (100 mg total) by mouth at bedtime. 90 tablet 1   trazodone (DESYREL) 300 MG tablet Take 1 tablet (300 mg total) by mouth at bedtime. 90 tablet 1   valACYclovir  (VALTREX) 1000 MG tablet Take 1 tablet (1,000 mg total) by mouth 2 (two) times daily. 20 tablet 0   venlafaxine XR (EFFEXOR XR) 75 MG 24 hr capsule Take 3 capsules (225 mg total) by mouth daily. 270 capsule 1   No current facility-administered medications for this visit.     Musculoskeletal: Strength & Muscle Tone:  N/A Gait &  Station:  N/A Patient leans: N/A  Psychiatric Specialty Exam: Review of Systems  There were no vitals taken for this visit.There is no height or weight on file to calculate BMI.  General Appearance: {Appearance:22683}  Eye Contact:  {BHH EYE CONTACT:22684}  Speech:  Clear and Coherent  Volume:  Normal  Mood:  {BHH MOOD:22306}  Affect:  {Affect (PAA):22687}  Thought Process:  Coherent  Orientation:  Full (Time, Place, and Person)  Thought Content: Logical   Suicidal Thoughts:  {ST/HT (PAA):22692}  Homicidal Thoughts:  {ST/HT (PAA):22692}  Memory:  Immediate;   Good  Judgement:  {Judgement (PAA):22694}  Insight:  {Insight (PAA):22695}  Psychomotor Activity:  Normal  Concentration:  Concentration: Good and Attention Span: Good  Recall:  Good  Fund of Knowledge: Good  Language: Good  Akathisia:  No  Handed:  Right  AIMS (if indicated): not done  Assets:  Communication Skills Desire for Improvement  ADL's:  Intact  Cognition: WNL  Sleep:  {BHH GOOD/FAIR/POOR:22877}   Screenings: GAD-7    Flowsheet Row Office Visit from 09/23/2021 in [facility] Visit from 09/09/2021 in [facility] Visit from 11/15/2020 in [facility] Visit from 04/01/2020 in [facility] Visit from 02/23/2019 in [facility]  Total GAD-7 Score 1 0 15 18 14       PHQ2-9    Flowsheet Row Video Visit from 09/29/2021 in [facility] Visit from 09/23/2021 in [facility] Visit from 09/09/2021 in [facility] Visit from 07/28/2021 in [facility] Office Visit from 06/11/2021 in [facility]  PHQ-2 Total Score 0 1 1 0 0  PHQ-9 Total Score 0 5 3 -- 0      Flowsheet Row Video Visit from 05/06/2021 in [facility] Video Visit from 03/25/2021 in [facility] No Risk No Risk        Assessment and Plan:  Elizabeth Malone is a 71 y.o. year old female with a history of bipolar I disorder, history of stage III CKD (resolved after ACI was discontinued per patient), hypertension, GERD, PONV, hyperlipidemia , who presents for follow up appointment for below.      1. Bipolar 1 disorder, depressed, partial remission ([place]) 2. Anxiety She denies significant mood symptoms despite loss of her mother in September.  She reports good relationship with her husband, engages well with church activities, and enjoys meeting with her friends.  She lowered lithium with the thought that it was discussed at the previous visit.  Will continue current dose of lithium and quetiapine to target bipolar disorder.  Will continue venlafaxine to target depression and anxiety.  Will continue BuSpar and gabapentin to target anxiety.    3. Insomnia, unspecified type She reports good benefit from trazodone.  Will continue current dose to target insomnia.    # HTN She was advised to contact her PCP for further evaluation/treatment.    Plan Continue lithium 300 mg once a day- monitor dry mouth Continue venlafaxine 225 mg daily Continue quetiapine 100 mg at night Continue Buspar 15 mg three times a day, Continue gabapentin 300 mg three times a day Continue trazodone 300 mg at night as needed for sleep Next appointment- 2/7 at 2 PM for 30 mins, video Obtain labs (TSH, lithium)     The patient demonstrates the following risk factors for suicide: Chronic risk factors for suicide include: psychiatric disorder of  bipolar disorder and previous suicide  attempts of cutting, putting cord around her neck, overdose on medication . Acute risk factors for suicide include: unemployment. Protective factors for this patient include: positive social support, coping skills, and hope for the future. Considering these factors, the overall suicide risk at this point appears to be low. Patient is appropriate for outpatient follow up.     [name], MD 10/16/2021, 11:03 AM

## 2021-10-21 ENCOUNTER — Telehealth: Payer: Medicare PPO | Admitting: Psychiatry

## 2021-10-22 ENCOUNTER — Other Ambulatory Visit: Payer: Self-pay | Admitting: Nurse Practitioner

## 2021-10-22 NOTE — Progress Notes (Signed)
Virtual Visit via Video Note  I connected with Elizabeth Malone on 10/24/21 at 10:30 AM EST by a video enabled telemedicine application and verified that I am speaking with the correct person using two identifiers.  Location: Patient: home Provider: office Persons participated in the visit- patient, provider    I discussed the limitations of evaluation and management by telemedicine and the availability of in person appointments. The patient expressed understanding and agreed to proceed.   I discussed the assessment and treatment plan with the patient. The patient was provided an opportunity to ask questions and all were answered. The patient agreed with the plan and demonstrated an understanding of the instructions.   The patient was advised to call back or seek an in-person evaluation if the symptoms worsen or if the condition fails to improve as anticipated.  I provided 17 minutes of non-face-to-face time during this encounter.   [name], MD    [facility] MD/PA/NP OP Progress Note  10/24/2021 11:06 AM Elizabeth Malone  MRN:  [id number]  Chief Complaint:  Chief Complaint   Follow-up; Other    HPI:  This is a follow-up appointment for bipolar disorder and insomnia.  She states that she had a procedure for dental abscess.  She continues to have dental pain.  Although she is clearly due to her pain at times, her mood is good otherwise.  She enjoys gardening, and she shares plants through video in her room.  She states that she wants to discontinue lithium.  She states that she never liked this medication.  She has dry mouth due to this medication.  She also states that she wants to lower the dose of venlafaxine, although she denies any side effect from this medication.  She states that the dose was uptitrated by previous provider, and has not changed for a few years.  She denies feeling depressed or anhedonia.  She sleeps well without trazodone.  She denies change in appetite.  She denies SI.   She denies decreased need for sleep or euphonia.  She did have a few episodes of doing different projects, one thing to another.  Although she did shopping up to hundred dollars, she bought what is needed, and denies any concern about this.  She denies alcohol use or drug use.  She feels comfortable to first try discontinuation of lithium only at this time.    Daily routine: sit on the porch with her husband Exercise: Employment: retired in 2008, used to work as Administrator Support: Household: husband Marital status: married for 50 years in 2022 Number of children: 2 (1 son and 1 daughter in [place]) She grew up in [place].  She reports lack of nurturing as a child.  Her father died from MI when young. Her mother was driving with her girlfriend, being around with married man while [place] was sitting in the back of the car. Had good relationship with her grandmother  Visit Diagnosis:    ICD-10-CM   1. Bipolar 1 disorder, depressed, partial remission ([place])  F31.75     2. Anxiety  F41.9     3. Insomnia, unspecified type  G47.00       Past Psychiatric History: Please see initial evaluation for full details. I have reviewed the history. No updates at this time.     Past Medical History:  Past Medical History:  Diagnosis Date   Anxiety    Bipolar disorder ([place])    CKD (chronic kidney disease) stage 3, GFR  30-59 ml/min (HCC)    Complication of anesthesia    Depression    GERD (gastroesophageal reflux disease)    occasionally has to use tums   Headache    History of diverticulosis    Hyperlipidemia    Macular degeneration    Overactive bladder    PONV (postoperative nausea and vomiting)     Past Surgical History:  Procedure Laterality Date   COLONOSCOPY WITH PROPOFOL N/A 10/07/2017   Procedure: COLONOSCOPY WITH PROPOFOL;  Surgeon: [name], MD;  Location: [facility];  Service: Endoscopy;  Laterality: N/A;   FOOT SURGERY Bilateral     KNEE SURGERY Left    POLYPECTOMY  10/07/2017   Procedure: POLYPECTOMY;  Surgeon: [name], MD;  Location: [facility];  Service: Endoscopy;;   TONSILLECTOMY AND ADENOIDECTOMY      Family Psychiatric History: Please see initial evaluation for full details. I have reviewed the history. No updates at this time.     Family History:  Family History  Problem Relation Age of Onset   Alzheimer's disease Mother    Bipolar disorder Mother    Depression Mother    Heart attack Father    Diabetes Brother    Obesity Brother    Depression Daughter    Stroke Maternal Grandmother    Depression Maternal Grandfather    Stroke Paternal Grandmother    Heart attack Paternal Grandfather    Breast cancer Neg Hx     Social History:  Social History   Socioeconomic History   Marital status: Married    Spouse name: Not on file   Number of children: 2   Years of education: Not on file   Highest education level: Bachelor's degree (e.g., BA, AB, BS)  Occupational History   Occupation: retired   Tobacco Use   Smoking status: Former    Types: Cigarettes    Start date: 07/09/1967    Quit date: 07/08/1985    Years since quitting: 36.3   Smokeless tobacco: Never  Vaping Use   Vaping Use: Never used  Substance and Sexual Activity   Alcohol use: Yes    Alcohol/week: 3.0 standard drinks    Types: 3 Shots of liquor per week    Comment: occassional   Drug use: No   Sexual activity: Not Currently  Other Topics Concern   Not on file  Social History Narrative   Not on file   Social Determinants of Health   Financial Resource Strain: Low Risk    Difficulty of Paying Living Expenses: Not hard at all  Food Insecurity: No Food Insecurity   Worried About Charity fundraiser in the Last Year: Never true   [place] in the Last Year: Never true  Transportation Needs: No Transportation Needs   Lack of Transportation (Medical): No   Lack of  Transportation (Non-Medical): No  Physical Activity: Inactive   Days of Exercise per Week: 0 days   Minutes of Exercise per Session: 0 min  Stress: No Stress Concern Present   Feeling of Stress : Not at all  Social Connections: Not on file    Allergies:  Allergies  Allergen Reactions   Fluoxetine Other (See Comments)   Hydrocodone-Chlorpheniramine Other (See Comments)   Paroxetine Hcl Other (See Comments)   Penicillin G Benzathine Other (See Comments)   Penicillin V Potassium Other (See Comments)   Clindamycin Hcl Rash and Other (See Comments)    Katherina Right' syndrome   Lamotrigine Rash  Metabolic Disorder Labs: Lab Results  Component Value Date   HGBA1C 4.9 08/17/2017   No results found for: PROLACTIN Lab Results  Component Value Date   CHOL 166 09/18/2021   TRIG 203 (H) 09/18/2021   HDL 44 09/18/2021   CHOLHDL 3.8 09/18/2021   VLDL 39 (H) 08/17/2017   LDLCALC 88 09/18/2021   LDLCALC 100 (H) 04/16/2021   Lab Results  Component Value Date   TSH 1.538 05/07/2021   TSH 2.490 04/16/2021    Therapeutic Level Labs: Lab Results  Component Value Date   LITHIUM 0.66 05/07/2021   LITHIUM 0.72 04/22/2020   No results found for: VALPROATE No components found for:  CBMZ  Current Medications: Current Outpatient Medications  Medication Sig Dispense Refill   atorvastatin (LIPITOR) 40 MG tablet Take 1 tablet (40 mg total) by mouth daily. 90 tablet 3   benazepril (LOTENSIN) 10 MG tablet Take 1 tablet (10 mg total) by mouth daily. 30 tablet 3   busPIRone (BUSPAR) 15 MG tablet Take 1 tablet (15 mg total) by mouth 3 (three) times daily. 270 tablet 1   Calcium Citrate-Vitamin D (CALCIUM + D PO) Take 1 tablet by mouth 2 (two) times daily.     fexofenadine (ALLERGY RELIEF) 180 MG tablet TAKE 1 TABLET(180 MG) BY MOUTH DAILY 90 tablet 4   fluticasone (FLONASE) 50 MCG/ACT nasal spray SHAKE LIQUID AND USE 2 SPRAYS IN EACH NOSTRIL DAILY 16 g 2   gabapentin  (NEURONTIN) 300 MG capsule Take 1 capsule (300 mg total) by mouth 3 (three) times daily. 270 capsule 1   metoprolol succinate (TOPROL-XL) 50 MG 24 hr tablet Take 1 tablet (50 mg total) by mouth daily. 30 tablet 4   montelukast (SINGULAIR) 10 MG tablet TAKE 1 TABLET(10 MG) BY MOUTH AT BEDTIME 90 tablet 0   Multiple Vitamin (MULTIVITAMIN) tablet Take 1 tablet by mouth daily.     naproxen (NAPROSYN) 500 MG tablet Take 1 tablet (500 mg total) by mouth 2 (two) times daily with a meal for 7 days. 14 tablet 0   Omega-3 Fatty Acids (FISH OIL) 1000 MG CAPS Take 1,000 mg by mouth 2 (two) times daily.     omeprazole (PRILOSEC) 40 MG capsule Take 1 capsule (40 mg total) by mouth daily. 90 capsule 4   oxybutynin (DITROPAN-XL) 10 MG 24 hr tablet TAKE 2 TABLETS AT BEDTIME. 180 tablet 4   QUEtiapine (SEROQUEL) 100 MG tablet Take 1 tablet (100 mg total) by mouth at bedtime. 90 tablet 1   valACYclovir (VALTREX) 1000 MG tablet Take 1 tablet (1,000 mg total) by mouth 2 (two) times daily. 20 tablet 0   venlafaxine XR (EFFEXOR XR) 75 MG 24 hr capsule Take 3 capsules (225 mg total) by mouth daily. 270 capsule 1   No current facility-administered medications for this visit.     Musculoskeletal: Strength & Muscle Tone:  N/A Gait & Station:  N/A Patient leans: N/A  Psychiatric Specialty Exam: Review of Systems  Psychiatric/Behavioral:  Negative for agitation, behavioral problems, confusion, decreased concentration, dysphoric mood, hallucinations, self-injury, sleep disturbance and suicidal ideas. The patient is not nervous/anxious and is not hyperactive.   All other systems reviewed and are negative.  There were no vitals taken for this visit.There is no height or weight on file to calculate BMI.  General Appearance: Fairly Groomed  Eye Contact:  Good  Speech:  Clear and Coherent  Volume:  Normal  Mood:   good  Affect:  Appropriate, Congruent, and euthymic  Thought Process:  Coherent  Orientation:   Full (Time, Place, and Person)  Thought Content: Logical   Suicidal Thoughts:  No  Homicidal Thoughts:  No  Memory:  Immediate;   Good  Judgement:  Good  Insight:  Good  Psychomotor Activity:  Normal  Concentration:  Concentration: Good and Attention Span: Good  Recall:  Good  Fund of Knowledge: Good  Language: Good  Akathisia:  No  Handed:  Right  AIMS (if indicated): not done  Assets:  Communication Skills Desire for Improvement  ADL's:  Intact  Cognition: WNL  Sleep:  Good   Screenings: GAD-7    Flowsheet Row Office Visit from 09/23/2021 in [facility] Visit from 09/09/2021 in [place] Visit from 11/15/2020 in [place] [facility] Visit from 04/01/2020 in [facility] Visit from 02/23/2019 in [facility]  Total GAD-7 Score 1 0 15 18 14       PHQ2-9    Flowsheet Row Video Visit from 10/24/2021 in [facility] Video Visit from 09/29/2021 in [facility] Visit from 09/23/2021 in [facility] Visit from 09/09/2021 in [facility] Visit from 07/28/2021 in [facility]  PHQ-2 Total Score 0 0 1 1 0  PHQ-9 Total Score -- 0 5 3 --      Flowsheet Row Video Visit from 05/06/2021 in [facility] Video Visit from 03/25/2021 in [facility] No Risk No Risk        Assessment and Plan:  Elizabeth Malone is a 71 y.o. year old female with a history of bipolar I disorder, history of stage III CKD (resolved after ACI was discontinued per patient), hypertension, GERD, PONV, hyperlipidemia, who presents for follow up appointment for below.    1. Bipolar 1 disorder, depressed, partial remission ([place]) 2. Anxiety She denies significant mood symptoms since the last visit.  She reports good relationship with her husband, engages well  with church activities, and enjoys meeting with her friends.  She requests lithium to be discontinued due to adverse reaction of dry mouth.  Will discontinue this medication.   Will continue current dose of quetiapine at this time to target bipolar disorder.  Noted that she has been on lower dose of quetiapine and lithium ; she is advised to contact the office if any worsening in her mood symptoms since discontinuation of lithium.  Will continue current dose of venlafaxine at this time to target depression and anxiety.  Will continue BuSpar and gabapentin for anxiety.   3. Insomnia, unspecified type She reports improvement since discontinuation of trazodone.  Will continue to monitor.    Plan (She requested that no refills to be given as she has piles of medication. She will contact the office if she needs any refills.) Discontinue lithium (was on 300 mg, caused dry mouth) Continue venlafaxine 225 mg daily Continue quetiapine 100 mg at night Continue Buspar 15 mg three times a day, Continue gabapentin 300 mg three times a day Hold Trazodone Next appointment- 3/9 at 10 AM, video   Past trials of medication: Abilify, lithium (dry mouth)   The patient demonstrates the following risk factors for suicide: Chronic risk factors for suicide include: psychiatric disorder of bipolar disorder and previous suicide attempts of cutting, putting cord around her neck, overdose on medication . Acute risk factors for suicide include: unemployment. Protective factors for this patient include: positive social  support, coping skills, and hope for the future. Considering these factors, the overall suicide risk at this point appears to be low. Patient is appropriate for outpatient follow up.     [name], MD 10/24/2021, 11:06 AM

## 2021-10-22 NOTE — Telephone Encounter (Signed)
Requested Prescriptions  Pending Prescriptions Disp Refills   oxybutynin (DITROPAN-XL) 10 MG 24 hr tablet [Pharmacy Med Name: OXYBUTYNIN CHLORIDE ER 10 MG Tablet Extended Release 24 Hour] 180 tablet 4    Sig: TAKE 2 TABLETS AT BEDTIME.     Urology:  Bladder Agents Passed - 10/22/2021  6:57 PM      Passed - Valid encounter within last 12 months    Recent Outpatient Visits          3 weeks ago Essential hypertension   [place] Vigg, Avanti, MD   4 weeks ago Encounter for screening for malignant neoplasm of breast, unspecified screening modality   [facility] Vigg, Avanti, MD   1 month ago Hyperlipidemia, unspecified hyperlipidemia type   [place] Vigg, Avanti, MD   4 months ago Need for influenza vaccination   [place] Vigg, Avanti, MD   6 months ago [place], [place]      Future Appointments            In 2 months Vigg, Avanti, MD [facility]   In 5 months  [employer], [place]

## 2021-10-23 ENCOUNTER — Other Ambulatory Visit: Payer: Medicare PPO

## 2021-10-23 ENCOUNTER — Other Ambulatory Visit: Payer: Self-pay

## 2021-10-24 ENCOUNTER — Encounter: Payer: Self-pay | Admitting: Internal Medicine

## 2021-10-24 ENCOUNTER — Encounter: Payer: Self-pay | Admitting: Psychiatry

## 2021-10-24 ENCOUNTER — Telehealth (INDEPENDENT_AMBULATORY_CARE_PROVIDER_SITE_OTHER): Payer: Medicare PPO | Admitting: Psychiatry

## 2021-10-24 ENCOUNTER — Other Ambulatory Visit: Payer: Medicare PPO

## 2021-10-24 ENCOUNTER — Ambulatory Visit (INDEPENDENT_AMBULATORY_CARE_PROVIDER_SITE_OTHER): Payer: Medicare PPO | Admitting: Internal Medicine

## 2021-10-24 VITALS — BP 127/76 | HR 60 | Temp 98.8°F | Ht 64.57 in | Wt 208.0 lb

## 2021-10-24 DIAGNOSIS — M1990 Unspecified osteoarthritis, unspecified site: Secondary | ICD-10-CM | POA: Insufficient documentation

## 2021-10-24 DIAGNOSIS — F3175 Bipolar disorder, in partial remission, most recent episode depressed: Secondary | ICD-10-CM

## 2021-10-24 DIAGNOSIS — F419 Anxiety disorder, unspecified: Secondary | ICD-10-CM

## 2021-10-24 DIAGNOSIS — G4700 Insomnia, unspecified: Secondary | ICD-10-CM | POA: Diagnosis not present

## 2021-10-24 DIAGNOSIS — E7800 Pure hypercholesterolemia, unspecified: Secondary | ICD-10-CM | POA: Diagnosis not present

## 2021-10-24 DIAGNOSIS — I10 Essential (primary) hypertension: Secondary | ICD-10-CM | POA: Diagnosis not present

## 2021-10-24 LAB — MICROSCOPIC EXAMINATION

## 2021-10-24 LAB — URINALYSIS, ROUTINE W REFLEX MICROSCOPIC
Bilirubin, UA: NEGATIVE
Glucose, UA: NEGATIVE
Ketones, UA: NEGATIVE
Nitrite, UA: NEGATIVE
Protein,UA: NEGATIVE
Specific Gravity, UA: 1.015 (ref 1.005–1.030)
Urobilinogen, Ur: 0.2 mg/dL (ref 0.2–1.0)
pH, UA: 6 (ref 5.0–7.5)

## 2021-10-24 MED ORDER — NAPROXEN 500 MG PO TABS: 500.0000 mg | ORAL_TABLET | Freq: Two times a day (BID) | ORAL | 0 refills | Status: AC

## 2021-10-24 NOTE — Progress Notes (Signed)
° °BP 127/76    Pulse 60    Temp 98.8 °F (37.1 °C) (Oral)    Ht 5' 4.57" (1.64 m)    Wt 208 lb (94.3 kg)    SpO2 98%    BMI 35.08 kg/m²   ° °Subjective:  ° ° Patient ID: Elizabeth Malone, female    DOB: 08/08/1951, 70 y.o.   MRN: [id number] ° °Chief Complaint  °Patient presents with  °• Ear Pain  °  Had recent mouth surgery.  ° ° °HPI: °Elizabeth Malone is a 70 y.o. female ° °Pt is here for a tooth abcess and says she had a pulled tooth and has had an infection int he tooth still heuting she was on abx - this morning.  ° °Patient presents with: °Ear Pain: Had recent mouth surgery. ° ° ° °Otalgia  ° ° °Chief Complaint  °Patient presents with  °• Ear Pain  °  Had recent mouth surgery.  ° ° °Relevant past medical, surgical, family and social history reviewed and updated as indicated. Interim medical history since our last visit reviewed. °Allergies and medications reviewed and updated. ° °Review of Systems  °HENT:  Positive for ear pain.   ° °Per HPI unless specifically indicated above ° °   °Objective:  °  °BP 127/76    Pulse 60    Temp 98.8 °F (37.1 °C) (Oral)    Ht 5' 4.57" (1.64 m)    Wt 208 lb (94.3 kg)    SpO2 98%    BMI 35.08 kg/m²   °Wt Readings from Last 3 Encounters:  °10/24/21 208 lb (94.3 kg)  °09/23/21 215 lb 12.8 oz (97.9 kg)  °09/09/21 222 lb (100.7 kg)  °  °Physical Exam °Vitals and nursing note reviewed.  °Constitutional:   °   General: She is not in acute distress. °   Appearance: Normal appearance. She is not ill-appearing or diaphoretic.  °Eyes:  °   Conjunctiva/sclera: Conjunctivae normal.  °Cardiovascular:  °   Rate and Rhythm: Normal rate.  °Pulmonary:  °   Breath sounds: No stridor. No rhonchi.  °Skin: °   General: Skin is warm and dry.  °   Coloration: Skin is not jaundiced.  °   Findings: No erythema.  °Neurological:  °   Mental Status: She is alert.  ° ° °Results for orders placed or performed in visit on 09/18/21  °Lipid panel  °Result Value Ref Range  ° Cholesterol, Total 166 100 - 199 mg/dL  °  Triglycerides 203 (H) 0 - 149 mg/dL  ° HDL 44 >39 mg/dL  ° VLDL Cholesterol Cal 34 5 - 40 mg/dL  ° LDL Chol Calc (NIH) 88 0 - 99 mg/dL  ° Chol/HDL Ratio 3.8 0.0 - 4.4 ratio  °Comprehensive metabolic panel  °Result Value Ref Range  ° Glucose 104 (H) 70 - 99 mg/dL  ° BUN 9 8 - 27 mg/dL  ° Creatinine, Ser 0.82 0.57 - 1.00 mg/dL  ° eGFR 77 >59 mL/min/1.73  ° BUN/Creatinine Ratio 11 (L) 12 - 28  ° Sodium 142 134 - 144 mmol/L  ° Potassium 4.7 3.5 - 5.2 mmol/L  ° Chloride 102 96 - 106 mmol/L  ° CO2 27 20 - 29 mmol/L  ° Calcium 10.1 8.7 - 10.3 mg/dL  ° Total Protein 7.2 6.0 - 8.5 g/dL  ° Albumin 4.4 3.8 - 4.8 g/dL  ° Globulin, Total 2.8 1.5 - 4.5 g/dL  ° Albumin/Globulin Ratio 1.6 1.2 - 2.2  °   Bilirubin Total 0.3 0.0 - 1.2 mg/dL  ° Alkaline Phosphatase 124 (H) 44 - 121 IU/L  ° AST 22 0 - 40 IU/L  ° ALT 35 (H) 0 - 32 IU/L  °CBC with Differential/Platelet  °Result Value Ref Range  ° WBC 7.2 3.4 - 10.8 x10E3/uL  ° RBC 4.70 3.77 - 5.28 x10E6/uL  ° Hemoglobin 14.5 11.1 - 15.9 g/dL  ° Hematocrit 43.0 34.0 - 46.6 %  ° MCV 92 79 - 97 fL  ° MCH 30.9 26.6 - 33.0 pg  ° MCHC 33.7 31.5 - 35.7 g/dL  ° RDW 12.3 11.7 - 15.4 %  ° Platelets 286 150 - 450 x10E3/uL  ° Neutrophils 65 Not Estab. %  ° Lymphs 24 Not Estab. %  ° Monocytes 8 Not Estab. %  ° Eos 2 Not Estab. %  ° Basos 1 Not Estab. %  ° Neutrophils Absolute 4.7 1.4 - 7.0 x10E3/uL  ° Lymphocytes Absolute 1.7 0.7 - 3.1 x10E3/uL  ° Monocytes Absolute 0.5 0.1 - 0.9 x10E3/uL  ° EOS (ABSOLUTE) 0.2 0.0 - 0.4 x10E3/uL  ° Basophils Absolute 0.1 0.0 - 0.2 x10E3/uL  ° Immature Granulocytes 0 Not Estab. %  ° Immature Grans (Abs) 0.0 0.0 - 0.1 x10E3/uL  ° °   ° ° °Current Outpatient Medications:  °•  naproxen (NAPROSYN) 500 MG tablet, Take 1 tablet (500 mg total) by mouth 2 (two) times daily with a meal for 7 days., Disp: 14 tablet, Rfl: 0 °•  atorvastatin (LIPITOR) 40 MG tablet, Take 1 tablet (40 mg total) by mouth daily., Disp: 90 tablet, Rfl: 3 °•  benazepril (LOTENSIN) 10 MG tablet, Take 1  tablet (10 mg total) by mouth daily., Disp: 30 tablet, Rfl: 3 °•  busPIRone (BUSPAR) 15 MG tablet, Take 1 tablet (15 mg total) by mouth 3 (three) times daily., Disp: 270 tablet, Rfl: 1 °•  Calcium Citrate-Vitamin D (CALCIUM + D PO), Take 1 tablet by mouth 2 (two) times daily., Disp: , Rfl:  °•  fexofenadine (ALLERGY RELIEF) 180 MG tablet, TAKE 1 TABLET(180 MG) BY MOUTH DAILY, Disp: 90 tablet, Rfl: 4 °•  fluticasone (FLONASE) 50 MCG/ACT nasal spray, SHAKE LIQUID AND USE 2 SPRAYS IN EACH NOSTRIL DAILY, Disp: 16 g, Rfl: 2 °•  gabapentin (NEURONTIN) 300 MG capsule, Take 1 capsule (300 mg total) by mouth 3 (three) times daily., Disp: 270 capsule, Rfl: 1 °•  lithium 300 MG tablet, Take 1 tablet (300 mg total) by mouth at bedtime., Disp: 90 tablet, Rfl: 0 °•  metoprolol succinate (TOPROL-XL) 50 MG 24 hr tablet, Take 1 tablet (50 mg total) by mouth daily., Disp: 30 tablet, Rfl: 4 °•  montelukast (SINGULAIR) 10 MG tablet, TAKE 1 TABLET(10 MG) BY MOUTH AT BEDTIME, Disp: 90 tablet, Rfl: 0 °•  Multiple Vitamin (MULTIVITAMIN) tablet, Take 1 tablet by mouth daily., Disp: , Rfl:  °•  Omega-3 Fatty Acids (FISH OIL) 1000 MG CAPS, Take 1,000 mg by mouth 2 (two) times daily., Disp: , Rfl:  °•  omeprazole (PRILOSEC) 40 MG capsule, Take 1 capsule (40 mg total) by mouth daily., Disp: 90 capsule, Rfl: 4 °•  oxybutynin (DITROPAN-XL) 10 MG 24 hr tablet, TAKE 2 TABLETS AT BEDTIME., Disp: 180 tablet, Rfl: 4 °•  QUEtiapine (SEROQUEL) 100 MG tablet, Take 1 tablet (100 mg total) by mouth at bedtime., Disp: 90 tablet, Rfl: 1 °•  trazodone (DESYREL) 300 MG tablet, Take 1 tablet (300 mg total) by mouth at bedtime., Disp: 90 tablet, Rfl: 1 °•    1   valACYclovir (VALTREX) 1000 MG tablet, Take 1 tablet (1,000 mg total) by mouth 2 (two) times daily., Disp: 20 tablet, Rfl: 0   venlafaxine XR (EFFEXOR XR) 75 MG 24 hr capsule, Take 3 capsules (225 mg total) by mouth daily., Disp: 270 capsule, Rfl: 1    Assessment & Plan:  Left ear pain :  will need to  start pt on naproxen bid.   2. Arthritis will refer to orthopedics   Orders Placed This Encounter  Procedures   Ambulatory referral to Orthopedics     Meds ordered this encounter  Medications   naproxen (NAPROSYN) 500 MG tablet    Sig: Take 1 tablet (500 mg total) by mouth 2 (two) times daily with a meal for 7 days.    Dispense:  14 tablet    Refill:  0     Follow up plan: Return in about 1 week (around 10/31/2021).

## 2021-10-24 NOTE — Patient Instructions (Signed)
Discontinue lithium  Continue venlafaxine 225 mg daily Continue quetiapine 100 mg at night Continue Buspar 15 mg three times a day, Continue gabapentin 300 mg three times a day Hold Trazodone Next appointment- 3/9 at 10 AM

## 2021-10-25 LAB — CBC WITH DIFFERENTIAL/PLATELET
Basophils Absolute: 0 10*3/uL (ref 0.0–0.2)
Basos: 1 %
EOS (ABSOLUTE): 0.2 10*3/uL (ref 0.0–0.4)
Eos: 3 %
Hematocrit: 43.3 % (ref 34.0–46.6)
Hemoglobin: 14.4 g/dL (ref 11.1–15.9)
Immature Grans (Abs): 0 10*3/uL (ref 0.0–0.1)
Immature Granulocytes: 0 %
Lymphocytes Absolute: 2 10*3/uL (ref 0.7–3.1)
Lymphs: 32 %
MCH: 30.3 pg (ref 26.6–33.0)
MCHC: 33.3 g/dL (ref 31.5–35.7)
MCV: 91 fL (ref 79–97)
Monocytes Absolute: 0.6 10*3/uL (ref 0.1–0.9)
Monocytes: 9 %
Neutrophils Absolute: 3.4 10*3/uL (ref 1.4–7.0)
Neutrophils: 55 %
Platelets: 294 10*3/uL (ref 150–450)
RBC: 4.76 x10E6/uL (ref 3.77–5.28)
RDW: 12.2 % (ref 11.7–15.4)
WBC: 6.3 10*3/uL (ref 3.4–10.8)

## 2021-10-25 LAB — LIPID PANEL
Chol/HDL Ratio: 4.9 ratio — ABNORMAL HIGH (ref 0.0–4.4)
Cholesterol, Total: 229 mg/dL — ABNORMAL HIGH (ref 100–199)
HDL: 47 mg/dL (ref >39)
LDL Chol Calc (NIH): 129 mg/dL — ABNORMAL HIGH (ref 0–99)
Triglycerides: 301 mg/dL — ABNORMAL HIGH (ref 0–149)
VLDL Cholesterol Cal: 53 mg/dL — ABNORMAL HIGH (ref 5–40)

## 2021-10-25 LAB — COMPREHENSIVE METABOLIC PANEL
ALT: 51 IU/L — ABNORMAL HIGH (ref 0–32)
AST: 33 IU/L (ref 0–40)
Albumin/Globulin Ratio: 1.8 (ref 1.2–2.2)
Albumin: 4.4 g/dL (ref 3.8–4.8)
Alkaline Phosphatase: 120 IU/L (ref 44–121)
BUN/Creatinine Ratio: 10 — ABNORMAL LOW (ref 12–28)
BUN: 8 mg/dL (ref 8–27)
Bilirubin Total: 0.3 mg/dL (ref 0.0–1.2)
CO2: 21 mmol/L (ref 20–29)
Calcium: 9.6 mg/dL (ref 8.7–10.3)
Chloride: 102 mmol/L (ref 96–106)
Creatinine, Ser: 0.79 mg/dL (ref 0.57–1.00)
Globulin, Total: 2.5 g/dL (ref 1.5–4.5)
Glucose: 107 mg/dL — ABNORMAL HIGH (ref 70–99)
Potassium: 4.5 mmol/L (ref 3.5–5.2)
Sodium: 140 mmol/L (ref 134–144)
Total Protein: 6.9 g/dL (ref 6.0–8.5)
eGFR: 80 mL/min/{1.73_m2} (ref >59)

## 2021-10-25 LAB — TSH: TSH: 1.58 u[IU]/mL (ref 0.450–4.500)

## 2021-10-28 ENCOUNTER — Ambulatory Visit: Payer: Medicare PPO | Admitting: Internal Medicine

## 2021-10-31 ENCOUNTER — Ambulatory Visit
Admission: RE | Admit: 2021-10-31 | Discharge: 2021-10-31 | Disposition: A | Discharge status: Home / Self Care | Payer: Medicare PPO | Source: Ambulatory Visit | Attending: Internal Medicine | Admitting: Internal Medicine

## 2021-10-31 ENCOUNTER — Other Ambulatory Visit: Payer: Self-pay

## 2021-10-31 DIAGNOSIS — Z1231 Encounter for screening mammogram for malignant neoplasm of breast: Secondary | ICD-10-CM | POA: Diagnosis not present

## 2021-11-01 NOTE — Progress Notes (Signed)
Contacted via MyChart   Normal mammogram, may repeat in one year:)

## 2021-11-04 ENCOUNTER — Other Ambulatory Visit: Payer: Self-pay

## 2021-11-04 ENCOUNTER — Encounter: Payer: Self-pay | Admitting: Internal Medicine

## 2021-11-04 ENCOUNTER — Ambulatory Visit: Payer: Medicare PPO | Admitting: Internal Medicine

## 2021-11-04 VITALS — BP 128/75 | HR 66 | Temp 98.2°F | Ht 64.57 in | Wt 208.0 lb

## 2021-11-04 DIAGNOSIS — G8929 Other chronic pain: Secondary | ICD-10-CM

## 2021-11-04 DIAGNOSIS — M25562 Pain in left knee: Secondary | ICD-10-CM | POA: Diagnosis not present

## 2021-11-04 MED ORDER — TRIAMCINOLONE ACETONIDE 40 MG/ML IJ SUSP
40.0000 mg | Freq: Once | INTRAMUSCULAR | Status: AC
  Administered 2021-11-04: 40 mg via INTRAMUSCULAR

## 2021-11-04 NOTE — Progress Notes (Signed)
BP 128/75    Pulse 66    Temp 98.2 F (36.8 C) (Oral)    Ht 5' 4.57" (1.64 m)    Wt 208 lb (94.3 kg)    SpO2 98%    BMI 35.08 kg/m    Subjective:    Patient ID: Elizabeth Malone, female    DOB: [birth year], 71 y.o.   MRN: [id number]  Chief Complaint  Patient presents with   Knee Pain    Left knee pain    HPI: Elizabeth Malone is a 71 y.o. female  Knee Pain  The incident occurred more than 1 week ago. The incident occurred at home. There was no injury mechanism. The pain is present in the left knee. The quality of the pain is described as stabbing. The pain is at a severity of 7/10. The pain is moderate. The pain has been Intermittent since onset. Pertinent negatives include no inability to bear weight, loss of motion, loss of sensation, muscle weakness, numbness or tingling. Associated symptoms comments: Worsens when she walks for more than 45 mins - 1 hr @ mall/ shopping . The treatment provided no relief.   Chief Complaint  Patient presents with   Knee Pain    Left knee pain    Relevant past medical, surgical, family and social history reviewed and updated as indicated. Interim medical history since our last visit reviewed. Allergies and medications reviewed and updated.  Review of Systems  Musculoskeletal:  Positive for joint swelling. Negative for arthralgias, back pain, gait problem, myalgias, neck pain and neck stiffness.  Neurological:  Negative for tingling, light-headedness, numbness and headaches.   Per HPI unless specifically indicated above     Objective:    BP 128/75    Pulse 66    Temp 98.2 F (36.8 C) (Oral)    Ht 5' 4.57" (1.64 m)    Wt 208 lb (94.3 kg)    SpO2 98%    BMI 35.08 kg/m   Wt Readings from Last 3 Encounters:  11/04/21 208 lb (94.3 kg)  10/24/21 208 lb (94.3 kg)  09/23/21 215 lb 12.8 oz (97.9 kg)    Physical Exam Constitutional:      Appearance: Normal appearance. She is obese.  Musculoskeletal:        General: Swelling and tenderness present.  No signs of injury.     Right lower leg: No edema.     Left lower leg: No edema.  Skin:    General: Skin is warm.     Coloration: Skin is not pale.     Findings: No erythema or rash.  Neurological:     Mental Status: She is alert.  Psychiatric:        Mood and Affect: Mood normal.        Behavior: Behavior normal.    Results for orders placed or performed in visit on 10/24/21  Microscopic Examination   Urine  Result Value Ref Range   WBC, UA 6-10 (A) 0 - 5 /hpf   RBC 0-2 0 - 2 /hpf   Epithelial Cells (non renal) 0-10 0 - 10 /hpf   Renal Epithel, UA 0-10 (A) None seen /hpf   Mucus, UA Present (A) Not Estab.   Bacteria, UA Moderate (A) None seen/Few  TSH  Result Value Ref Range   TSH 1.580 0.450 - 4.500 uIU/mL  Urinalysis, Routine w reflex microscopic  Result Value Ref Range   Specific Gravity, UA 1.015 1.005 - 1.030   pH,  UA 6.0 5.0 - 7.5   Color, UA Yellow Yellow   Appearance Ur Cloudy (A) Clear   Leukocytes,UA 3+ (A) Negative   Protein,UA Negative Negative/Trace   Glucose, UA Negative Negative   Ketones, UA Negative Negative   RBC, UA Trace (A) Negative   Bilirubin, UA Negative Negative   Urobilinogen, Ur 0.2 0.2 - 1.0 mg/dL   Nitrite, UA Negative Negative   Microscopic Examination See below:   Lipid panel  Result Value Ref Range   Cholesterol, Total 229 (H) 100 - 199 mg/dL   Triglycerides 301 (H) 0 - 149 mg/dL   HDL 47 >39 mg/dL   VLDL Cholesterol Cal 53 (H) 5 - 40 mg/dL   LDL Chol Calc (NIH) 129 (H) 0 - 99 mg/dL   Chol/HDL Ratio 4.9 (H) 0.0 - 4.4 ratio  Comprehensive metabolic panel  Result Value Ref Range   Glucose 107 (H) 70 - 99 mg/dL   BUN 8 8 - 27 mg/dL   Creatinine, Ser 0.79 0.57 - 1.00 mg/dL   eGFR 80 >59 mL/min/1.73   BUN/Creatinine Ratio 10 (L) 12 - 28   Sodium 140 134 - 144 mmol/L   Potassium 4.5 3.5 - 5.2 mmol/L   Chloride 102 96 - 106 mmol/L   CO2 21 20 - 29 mmol/L   Calcium 9.6 8.7 - 10.3 mg/dL   Total Protein 6.9 6.0 - 8.5 g/dL   Albumin  4.4 3.8 - 4.8 g/dL   Globulin, Total 2.5 1.5 - 4.5 g/dL   Albumin/Globulin Ratio 1.8 1.2 - 2.2   Bilirubin Total 0.3 0.0 - 1.2 mg/dL   Alkaline Phosphatase 120 44 - 121 IU/L   AST 33 0 - 40 IU/L   ALT 51 (H) 0 - 32 IU/L  CBC with Differential/Platelet  Result Value Ref Range   WBC 6.3 3.4 - 10.8 x10E3/uL   RBC 4.76 3.77 - 5.28 x10E6/uL   Hemoglobin 14.4 11.1 - 15.9 g/dL   Hematocrit 43.3 34.0 - 46.6 %   MCV 91 79 - 97 fL   MCH 30.3 26.6 - 33.0 pg   MCHC 33.3 31.5 - 35.7 g/dL   RDW 12.2 11.7 - 15.4 %   Platelets 294 150 - 450 x10E3/uL   Neutrophils 55 Not Estab. %   Lymphs 32 Not Estab. %   Monocytes 9 Not Estab. %   Eos 3 Not Estab. %   Basos 1 Not Estab. %   Neutrophils Absolute 3.4 1.4 - 7.0 x10E3/uL   Lymphocytes Absolute 2.0 0.7 - 3.1 x10E3/uL   Monocytes Absolute 0.6 0.1 - 0.9 x10E3/uL   EOS (ABSOLUTE) 0.2 0.0 - 0.4 x10E3/uL   Basophils Absolute 0.0 0.0 - 0.2 x10E3/uL   Immature Granulocytes 0 Not Estab. %   Immature Grans (Abs) 0.0 0.0 - 0.1 x10E3/uL        Current Outpatient Medications:    atorvastatin (LIPITOR) 40 MG tablet, Take 1 tablet (40 mg total) by mouth daily., Disp: 90 tablet, Rfl: 3   benazepril (LOTENSIN) 10 MG tablet, Take 1 tablet (10 mg total) by mouth daily., Disp: 30 tablet, Rfl: 3   busPIRone (BUSPAR) 15 MG tablet, Take 1 tablet (15 mg total) by mouth 3 (three) times daily., Disp: 270 tablet, Rfl: 1   Calcium Citrate-Vitamin D (CALCIUM + D PO), Take 1 tablet by mouth 2 (two) times daily., Disp: , Rfl:    fexofenadine (ALLERGY RELIEF) 180 MG tablet, TAKE 1 TABLET(180 MG) BY MOUTH DAILY, Disp: 90 tablet, Rfl: 4  fluticasone (FLONASE) 50 MCG/ACT nasal spray, SHAKE LIQUID AND USE 2 SPRAYS IN EACH NOSTRIL DAILY, Disp: 16 g, Rfl: 2   gabapentin (NEURONTIN) 300 MG capsule, Take 1 capsule (300 mg total) by mouth 3 (three) times daily., Disp: 270 capsule, Rfl: 1   metoprolol succinate (TOPROL-XL) 50 MG 24 hr tablet, Take 1 tablet (50 mg total) by  mouth daily., Disp: 30 tablet, Rfl: 4   montelukast (SINGULAIR) 10 MG tablet, TAKE 1 TABLET(10 MG) BY MOUTH AT BEDTIME, Disp: 90 tablet, Rfl: 0   Multiple Vitamin (MULTIVITAMIN) tablet, Take 1 tablet by mouth daily., Disp: , Rfl:    Omega-3 Fatty Acids (FISH OIL) 1000 MG CAPS, Take 1,000 mg by mouth 2 (two) times daily., Disp: , Rfl:    omeprazole (PRILOSEC) 40 MG capsule, Take 1 capsule (40 mg total) by mouth daily., Disp: 90 capsule, Rfl: 4   oxybutynin (DITROPAN-XL) 10 MG 24 hr tablet, TAKE 2 TABLETS AT BEDTIME., Disp: 180 tablet, Rfl: 4   QUEtiapine (SEROQUEL) 100 MG tablet, Take 1 tablet (100 mg total) by mouth at bedtime., Disp: 90 tablet, Rfl: 1   valACYclovir (VALTREX) 1000 MG tablet, Take 1 tablet (1,000 mg total) by mouth 2 (two) times daily., Disp: 20 tablet, Rfl: 0   venlafaxine XR (EFFEXOR XR) 75 MG 24 hr capsule, Take 3 capsules (225 mg total) by mouth daily., Disp: 270 capsule, Rfl: 1  Current Facility-Administered Medications:    triamcinolone acetonide (KENALOG-40) injection 40 mg, 40 mg, Intramuscular, Once, Aella Ronda, MD    Assessment & Plan:  Left knee pain Knee injection   The risks, benefits and side effects of treatment were discussed with the patient. After consent was obtained, using sterile technique the left knee was prepped Pt's left knee joint prepped and cleaned with alcohol wipes.   patella was palpated and  pt injected with 1 ml of kenalog and 4 ml of lidocaine under aseptic conditions. no bleeding or post procedural pain noted. Pt felt better after procedure, tolerated procedure well.  The patient was advised to call the office if symptoms worsen or do not improve. pt to fu next week for an injection of her shoulder. The patient is asked to continue to rest the knee for a few more days before resuming regular activities .Call or return to clinic prn if such symptoms occur or the knee fails to improve as anticipated.  Problem List Items Addressed This  Visit       Other   Chronic pain of left knee - Primary   Relevant Medications   triamcinolone acetonide (KENALOG-40) injection 40 mg     No orders of the defined types were placed in this encounter.       Follow up plan: Return in about 2 weeks (around 11/18/2021).

## 2021-11-07 DIAGNOSIS — M1712 Unilateral primary osteoarthritis, left knee: Secondary | ICD-10-CM | POA: Diagnosis not present

## 2021-11-07 DIAGNOSIS — M19012 Primary osteoarthritis, left shoulder: Secondary | ICD-10-CM | POA: Diagnosis not present

## 2021-11-07 DIAGNOSIS — M19011 Primary osteoarthritis, right shoulder: Secondary | ICD-10-CM | POA: Diagnosis not present

## 2021-11-10 ENCOUNTER — Ambulatory Visit: Payer: Self-pay | Admitting: *Deleted

## 2021-11-10 NOTE — Telephone Encounter (Signed)
Reason for Disposition  [1] Any break in skin from BITE (e.g., cut, puncture or scratch) AND[2] PET animal (e.g., dog, cat, or ferret) at risk for RABIES (e.g., sick, stray, unprovoked bite, developing country)  Answer Assessment - Initial Assessment Questions 1. ANIMAL: "What type of animal caused the bite?" "Is the injury from a bite or a claw?" If the animal is a dog or a cat, ask: "Was it a pet or a stray?" "Was it acting ill or behaving strangely?"     Dog bite 2. LOCATION: "Where is the bite located?"      hand 3. SIZE: "How big is the bite?" "What does it look like?"      Puncture wounds on left 4. ONSET: "When did the bite happen?" (Minutes or hours ago)      Saturday 5. CIRCUMSTANCES: "Tell me how this happened."      Loss dog- owner is not clear on rabies vaccine 6. TETANUS: "When was the last tetanus booster?"     8 years 7. PREGNANCY: "Is there any chance you are pregnant?" "When was your last menstrual period?"     *No Answer*  Protocols used: Animal Bite-A-AH

## 2021-11-10 NOTE — Telephone Encounter (Signed)
°  Chief Complaint: dog bite Symptoms: puncture wounds, bruising- healing well- questions about vaccines Frequency:   Pertinent Negatives: Patient denies fever, infection Disposition: [] ED /[] Urgent Care (no appt availability in office) / [x] Appointment(In office/virtual)/ []  [facility]/ [] Home Care/ [] Refused Recommended Disposition /[] [facility]/ []  Follow-up with PCP Additional Notes: Declines ED- appointment scheduled.

## 2021-11-11 ENCOUNTER — Ambulatory Visit: Payer: Medicare PPO | Admitting: Internal Medicine

## 2021-11-18 ENCOUNTER — Ambulatory Visit: Payer: Medicare PPO | Admitting: Internal Medicine

## 2021-11-20 ENCOUNTER — Telehealth: Payer: Medicare PPO | Admitting: Psychiatry

## 2021-11-21 NOTE — Progress Notes (Signed)
Virtual Visit via Telephone Note  I connected with Elizabeth Malone on 11/24/21 at  3:30 PM EDT by telephone and verified that I am speaking with the correct person using two identifiers.  Location: Patient: home Provider: office Persons participated in the visit- patient, provider    I discussed the limitations, risks, security and privacy concerns of performing an evaluation and management service by telephone and the availability of in person appointments. I also discussed with the patient that there may be a patient responsible charge related to this service. The patient expressed understanding and agreed to proceed.   I discussed the assessment and treatment plan with the patient. The patient was provided an opportunity to ask questions and all were answered. The patient agreed with the plan and demonstrated an understanding of the instructions.   The patient was advised to call back or seek an in-person evaluation if the symptoms worsen or if the condition fails to improve as anticipated.  I provided 13 minutes of non-face-to-face time during this encounter.   [name], MD    [facility] MD/PA/NP OP Progress Note  11/24/2021 4:14 PM Elizabeth Malone  MRN:  [id number]  Chief Complaint:  Chief Complaint  Patient presents with   Follow-up   HPI:  This is a follow-up appointment for bipolar 2 disorder.  She states that she has been better since discontinuation of lithium and trazodone.  She feels more alert.  She has been involving in lots of things.  She enjoyed going to the beach with her husband.  She enjoys time with her son and grandchildren.  She enjoys doing Quar[employer].  She is heavily involved in things at church.  She wants to discontinue gabapentin; she was advised by another provider to taper off this medication.  She denies any neuropathic pain.  She has started to go to water aerobics, which has been helpful for arthritis.  She sleeps good most of the time.  She denies feeling  depressed anhedonia; she feels good about weight loss since doing water aerobic.  She denies change in appetite.  She denies SI.  She denies anxiety.  She denies decreased need for sleep or euphonia.   200 lbs Wt Readings from Last 3 Encounters:  11/04/21 208 lb (94.3 kg)  10/24/21 208 lb (94.3 kg)  09/23/21 215 lb 12.8 oz (97.9 kg)     Daily routine: sit on the porch with her husband Exercise: Employment: retired in 2008, used to work as Administrator Support: Household: husband Marital status: married for 50 years in 2022 Number of children: 2 (1 son and 1 daughter in [place]) She grew up in [place].  She reports lack of nurturing as a child.  Her father died from MI when young. Her mother was driving with her girlfriend, being around with married man while [name] was sitting in the back of the car. Had good relationship with her grandmother   Visit Diagnosis:    ICD-10-CM   1. Bipolar II disorder in full remission ([place])  F31.81     2. Anxiety  F41.9       Past Psychiatric History: Please see initial evaluation for full details. I have reviewed the history. No updates at this time.     Past Medical History:  Past Medical History:  Diagnosis Date   Anxiety    Bipolar disorder ([place])    CKD (chronic kidney disease) stage 3, GFR 30-59 ml/min (HCC)    Complication of anesthesia  Depression    GERD (gastroesophageal reflux disease)    occasionally has to use tums   Headache    History of diverticulosis    Hyperlipidemia    Macular degeneration    Overactive bladder    PONV (postoperative nausea and vomiting)     Past Surgical History:  Procedure Laterality Date   COLONOSCOPY WITH PROPOFOL N/A 10/07/2017   Procedure: COLONOSCOPY WITH PROPOFOL;  Surgeon: [name], MD;  Location: [facility];  Service: Endoscopy;  Laterality: N/A;   FOOT SURGERY Bilateral    KNEE SURGERY Left    POLYPECTOMY  10/07/2017   Procedure: POLYPECTOMY;  Surgeon: [name], MD;  Location: [facility];  Service: Endoscopy;;   TONSILLECTOMY AND ADENOIDECTOMY      Family Psychiatric History: Please see initial evaluation for full details. I have reviewed the history. No updates at this time.     Family History:  Family History  Problem Relation Age of Onset   Alzheimer's disease Mother    Bipolar disorder Mother    Depression Mother    Heart attack Father    Diabetes Brother    Obesity Brother    Depression Daughter    Stroke Maternal Grandmother    Depression Maternal Grandfather    Stroke Paternal Grandmother    Heart attack Paternal Grandfather    Breast cancer Neg Hx     Social History:  Social History   Socioeconomic History   Marital status: Married    Spouse name: Not on file   Number of children: 2   Years of education: Not on file   Highest education level: Bachelor's degree (e.g., BA, AB, BS)  Occupational History   Occupation: retired   Tobacco Use   Smoking status: Former    Types: Cigarettes    Start date: 07/09/1967    Quit date: 07/08/1985    Years since quitting: 36.4   Smokeless tobacco: Never  Vaping Use   Vaping Use: Never used  Substance and Sexual Activity   Alcohol use: Yes    Alcohol/week: 3.0 standard drinks    Types: 3 Shots of liquor per week    Comment: occassional   Drug use: No   Sexual activity: Not Currently  Other Topics Concern   Not on file  Social History Narrative   Not on file   Social Determinants of Health   Financial Resource Strain: Low Risk    Difficulty of Paying Living Expenses: Not hard at all  Food Insecurity: No Food Insecurity   Worried About Charity fundraiser in the Last Year: Never true   [place] in the Last Year: Never true  Transportation Needs: No Transportation Needs   Lack of Transportation (Medical): No   Lack of Transportation (Non-Medical): No  Physical Activity: Inactive   Days of Exercise per Week: 0 days   Minutes of Exercise per  Session: 0 min  Stress: No Stress Concern Present   Feeling of Stress : Not at all  Social Connections: Not on file    Allergies:  Allergies  Allergen Reactions   Fluoxetine Other (See Comments)   Hydrocodone-Chlorpheniramine Other (See Comments)   Paroxetine Hcl Other (See Comments)   Penicillin G Benzathine Other (See Comments)   Penicillin V Potassium Other (See Comments)   Clindamycin Hcl Rash and Other (See Comments)    Katherina Right' syndrome   Lamotrigine Rash    Metabolic Disorder Labs: Lab Results  Component Value Date  HGBA1C 4.9 08/17/2017   No results found for: PROLACTIN Lab Results  Component Value Date   CHOL 229 (H) 10/24/2021   TRIG 301 (H) 10/24/2021   HDL 47 10/24/2021   CHOLHDL 4.9 (H) 10/24/2021   VLDL 39 (H) 08/17/2017   LDLCALC 129 (H) 10/24/2021   LDLCALC 88 09/18/2021   Lab Results  Component Value Date   TSH 1.580 10/24/2021   TSH 1.538 05/07/2021    Therapeutic Level Labs: Lab Results  Component Value Date   LITHIUM 0.66 05/07/2021   LITHIUM 0.72 04/22/2020   No results found for: VALPROATE No components found for:  CBMZ  Current Medications: Current Outpatient Medications  Medication Sig Dispense Refill   atorvastatin (LIPITOR) 40 MG tablet Take 1 tablet (40 mg total) by mouth daily. 90 tablet 3   benazepril (LOTENSIN) 10 MG tablet Take 1 tablet (10 mg total) by mouth daily. 30 tablet 3   busPIRone (BUSPAR) 15 MG tablet Take 1 tablet (15 mg total) by mouth 3 (three) times daily. 270 tablet 1   Calcium Citrate-Vitamin D (CALCIUM + D PO) Take 1 tablet by mouth 2 (two) times daily.     fexofenadine (ALLERGY RELIEF) 180 MG tablet TAKE 1 TABLET(180 MG) BY MOUTH DAILY 90 tablet 4   fluticasone (FLONASE) 50 MCG/ACT nasal spray SHAKE LIQUID AND USE 2 SPRAYS IN EACH NOSTRIL DAILY 16 g 2   metoprolol succinate (TOPROL-XL) 50 MG 24 hr tablet Take 1 tablet (50 mg total) by mouth daily. 30 tablet 4   montelukast (SINGULAIR) 10 MG tablet  TAKE 1 TABLET(10 MG) BY MOUTH AT BEDTIME 90 tablet 0   Multiple Vitamin (MULTIVITAMIN) tablet Take 1 tablet by mouth daily.     Omega-3 Fatty Acids (FISH OIL) 1000 MG CAPS Take 1,000 mg by mouth 2 (two) times daily.     omeprazole (PRILOSEC) 40 MG capsule Take 1 capsule (40 mg total) by mouth daily. 90 capsule 4   oxybutynin (DITROPAN-XL) 10 MG 24 hr tablet TAKE 2 TABLETS AT BEDTIME. 180 tablet 4   QUEtiapine (SEROQUEL) 100 MG tablet Take 1 tablet (100 mg total) by mouth at bedtime. 90 tablet 1   valACYclovir (VALTREX) 1000 MG tablet Take 1 tablet (1,000 mg total) by mouth 2 (two) times daily. 20 tablet 0   venlafaxine XR (EFFEXOR XR) 75 MG 24 hr capsule Take 3 capsules (225 mg total) by mouth daily. 270 capsule 1   No current facility-administered medications for this visit.     Musculoskeletal: Strength & Muscle Tone:  N/A Gait & Station:  N/A Patient leans: N/A  Psychiatric Specialty Exam: Review of Systems  Psychiatric/Behavioral:  Negative for agitation, behavioral problems, confusion, decreased concentration, dysphoric mood, hallucinations, self-injury, sleep disturbance and suicidal ideas. The patient is nervous/anxious. The patient is not hyperactive.   All other systems reviewed and are negative.  There were no vitals taken for this visit.There is no height or weight on file to calculate BMI.  General Appearance: NA  Eye Contact:  NA  Speech:  Clear and Coherent  Volume:  Normal  Mood:   good  Affect:  NA  Thought Process:  Coherent  Orientation:  Full (Time, Place, and Person)  Thought Content: Logical   Suicidal Thoughts:  No  Homicidal Thoughts:  No  Memory:  Immediate;   Good  Judgement:  Good  Insight:  Good  Psychomotor Activity:  Normal  Concentration:  Concentration: Good and Attention Span: Good  Recall:  Good  Fund of  Knowledge: Good  Language: Good  Akathisia:  No  Handed:  Right  AIMS (if indicated): not done  Assets:  Communication Skills Desire for  Improvement  ADL's:  Intact  Cognition: WNL  Sleep:  Fair   Screenings: GAD-7    Flowsheet Row Office Visit from 09/23/2021 in [facility] Office Visit from 09/09/2021 in [facility] Visit from 11/15/2020 in [facility] Visit from 04/01/2020 in [facility] Visit from 02/23/2019 in [facility]  Total GAD-7 Score 1 0 '15 18 14      '$ PHQ2-9    Flowsheet Row Video Visit from 10/24/2021 in [facility] Video Visit from 09/29/2021 in [facility] Visit from 09/23/2021 in [facility] Visit from 09/09/2021 in [facility] Visit from 07/28/2021 in [facility]  PHQ-2 Total Score 0 0 1 1 0  PHQ-9 Total Score -- 0 5 3 --      Flowsheet Row Video Visit from 05/06/2021 in [facility] Video Visit from 03/25/2021 in [facility] No Risk No Risk        Assessment and Plan:  Elizabeth Malone is a 71 y.o. year old female with a history of bipolar II disorder, history of stage III CKD (resolved after ACI was discontinued per patient), hypertension, GERD, PONV, hyperlipidemia, who presents for follow up appointment for below.    1. Bipolar II disorder in full remission ([place]) 2. Anxiety She denies any significant mood symptoms, and has been able to successfully off lithium since the last visit. She reports good relationship with her husband, engages well with church activities, and enjoys meeting with her friends.  She would like to discontinue gabapentin; will taper off this medication this time.  Will continue current dose of venlafaxine to target depression and anxiety.  Will continue quetiapine to target bipolar 2 disorder.  Noted that although she was diagnosed with bipolar 1 disorder by other provider, she had only hypomanic symptoms, and  mainly struggled with depression.  Will continue to monitor.  Will continue buspar for anxiety.     Plan (She requested that no refills to be given as she has piles of medication. She will contact the office if she needs any refills.) Continue venlafaxine 225 mg daily Continue quetiapine 100 mg at night Continue Buspar 15 mg three times a day, Decrease gabapentin 300 mg daily for one week, then discontinue Next appointment- 5/2 at 4 PM, video   Past trials of medication: Abilify, lithium (xerostomia)    The patient demonstrates the following risk factors for suicide: Chronic risk factors for suicide include: psychiatric disorder of bipolar disorder and previous suicide attempts of cutting, putting cord around her neck, overdose on medication . Acute risk factors for suicide include: unemployment. Protective factors for this patient include: positive social support, coping skills, and hope for the future. Considering these factors, the overall suicide risk at this point appears to be low. Patient is appropriate for outpatient follow up.    Collaboration of Care: Collaboration of Care: Other N/A  Patient/Guardian was advised Release of Information must be obtained prior to any record release in order to collaborate their care with an outside provider. Patient/Guardian was advised if they have not already done so to contact the registration department to sign all necessary forms in order for [place] to release information regarding their care.   Consent: Patient/Guardian gives  verbal consent for treatment and assignment of benefits for services provided during this visit. Patient/Guardian expressed understanding and agreed to proceed.    [name], MD 11/24/2021, 4:14 PM

## 2021-11-24 ENCOUNTER — Other Ambulatory Visit: Payer: Self-pay

## 2021-11-24 ENCOUNTER — Encounter: Payer: Self-pay | Admitting: Psychiatry

## 2021-11-24 ENCOUNTER — Telehealth (INDEPENDENT_AMBULATORY_CARE_PROVIDER_SITE_OTHER): Payer: Medicare PPO | Admitting: Psychiatry

## 2021-11-24 DIAGNOSIS — F3181 Bipolar II disorder: Secondary | ICD-10-CM

## 2021-11-24 DIAGNOSIS — F419 Anxiety disorder, unspecified: Secondary | ICD-10-CM | POA: Diagnosis not present

## 2021-11-24 NOTE — Patient Instructions (Signed)
Continue venlafaxine 225 mg daily ?Continue quetiapine 100 mg at night ?Continue Buspar 15 mg three times a day, ?Decrease gabapentin 300 mg daily for one week, then discontinue ?Next appointment- 5/2 at 4 PM, ?

## 2021-11-27 ENCOUNTER — Other Ambulatory Visit: Payer: Self-pay

## 2021-11-27 MED ORDER — OMEPRAZOLE 40 MG PO CPDR: 40.0000 mg | DELAYED_RELEASE_CAPSULE | Freq: Every day | ORAL | 4 refills | Status: DC

## 2021-12-05 ENCOUNTER — Encounter: Payer: Self-pay | Admitting: Internal Medicine

## 2021-12-05 DIAGNOSIS — M1712 Unilateral primary osteoarthritis, left knee: Secondary | ICD-10-CM | POA: Diagnosis not present

## 2021-12-16 ENCOUNTER — Telehealth: Payer: Self-pay | Admitting: Internal Medicine

## 2021-12-16 DIAGNOSIS — Z1329 Encounter for screening for other suspected endocrine disorder: Secondary | ICD-10-CM

## 2021-12-16 DIAGNOSIS — G8929 Other chronic pain: Secondary | ICD-10-CM

## 2021-12-16 DIAGNOSIS — Z131 Encounter for screening for diabetes mellitus: Secondary | ICD-10-CM

## 2021-12-16 DIAGNOSIS — I10 Essential (primary) hypertension: Secondary | ICD-10-CM

## 2021-12-16 DIAGNOSIS — E7800 Pure hypercholesterolemia, unspecified: Secondary | ICD-10-CM

## 2021-12-16 DIAGNOSIS — M1990 Unspecified osteoarthritis, unspecified site: Secondary | ICD-10-CM

## 2021-12-16 DIAGNOSIS — N3281 Overactive bladder: Secondary | ICD-10-CM

## 2021-12-16 NOTE — Telephone Encounter (Signed)
Orders placed ?Patient made aware of results and verbalized understanding.  ? ?

## 2021-12-16 NOTE — Telephone Encounter (Signed)
Copied from [facility]. Topic: General - Other ?>> Dec 16, 2021 12:52 PM Pawlus, Brayton Layman A wrote: ?Reason for CRM: Pt wanted to know if lab orders could be placed before her appt on 4/17, pt wanted to know if her labs needed to be done a week prior to her appt, please advise. ?

## 2021-12-22 ENCOUNTER — Ambulatory Visit: Payer: Medicare PPO | Admitting: Internal Medicine

## 2021-12-22 ENCOUNTER — Other Ambulatory Visit (INDEPENDENT_AMBULATORY_CARE_PROVIDER_SITE_OTHER): Payer: Medicare PPO

## 2021-12-22 DIAGNOSIS — Z1329 Encounter for screening for other suspected endocrine disorder: Secondary | ICD-10-CM

## 2021-12-22 DIAGNOSIS — I10 Essential (primary) hypertension: Secondary | ICD-10-CM | POA: Diagnosis not present

## 2021-12-22 DIAGNOSIS — M1990 Unspecified osteoarthritis, unspecified site: Secondary | ICD-10-CM

## 2021-12-22 DIAGNOSIS — M25512 Pain in left shoulder: Secondary | ICD-10-CM | POA: Diagnosis not present

## 2021-12-22 DIAGNOSIS — G8929 Other chronic pain: Secondary | ICD-10-CM

## 2021-12-22 DIAGNOSIS — M25511 Pain in right shoulder: Secondary | ICD-10-CM | POA: Diagnosis not present

## 2021-12-22 DIAGNOSIS — E7800 Pure hypercholesterolemia, unspecified: Secondary | ICD-10-CM | POA: Diagnosis not present

## 2021-12-22 DIAGNOSIS — Z131 Encounter for screening for diabetes mellitus: Secondary | ICD-10-CM | POA: Diagnosis not present

## 2021-12-22 DIAGNOSIS — N3281 Overactive bladder: Secondary | ICD-10-CM | POA: Diagnosis not present

## 2021-12-22 LAB — BAYER DCA HB A1C WAIVED: HB A1C (BAYER DCA - WAIVED): 5.3 % (ref 4.8–5.6)

## 2021-12-23 LAB — COMPREHENSIVE METABOLIC PANEL
ALT: 28 IU/L (ref 0–32)
AST: 18 IU/L (ref 0–40)
Albumin/Globulin Ratio: 2 (ref 1.2–2.2)
Albumin: 4.6 g/dL (ref 3.8–4.8)
Alkaline Phosphatase: 140 IU/L — ABNORMAL HIGH (ref 44–121)
BUN/Creatinine Ratio: 12 (ref 12–28)
BUN: 10 mg/dL (ref 8–27)
Bilirubin Total: 0.5 mg/dL (ref 0.0–1.2)
CO2: 27 mmol/L (ref 20–29)
Calcium: 10 mg/dL (ref 8.7–10.3)
Chloride: 104 mmol/L (ref 96–106)
Creatinine, Ser: 0.82 mg/dL (ref 0.57–1.00)
Globulin, Total: 2.3 g/dL (ref 1.5–4.5)
Glucose: 103 mg/dL — ABNORMAL HIGH (ref 70–99)
Potassium: 4.2 mmol/L (ref 3.5–5.2)
Sodium: 142 mmol/L (ref 134–144)
Total Protein: 6.9 g/dL (ref 6.0–8.5)
eGFR: 77 mL/min/{1.73_m2} (ref >59)

## 2021-12-23 LAB — THYROID PANEL WITH TSH
Free Thyroxine Index: 1.8 (ref 1.2–4.9)
T3 Uptake Ratio: 26 % (ref 24–39)
T4, Total: 7.1 ug/dL (ref 4.5–12.0)
TSH: 1.1 u[IU]/mL (ref 0.450–4.500)

## 2021-12-23 LAB — CBC WITH DIFFERENTIAL/PLATELET
Basophils Absolute: 0 10*3/uL (ref 0.0–0.2)
Basos: 1 %
EOS (ABSOLUTE): 0.1 10*3/uL (ref 0.0–0.4)
Eos: 2 %
Hematocrit: 43.8 % (ref 34.0–46.6)
Hemoglobin: 14.5 g/dL (ref 11.1–15.9)
Immature Grans (Abs): 0 10*3/uL (ref 0.0–0.1)
Immature Granulocytes: 1 %
Lymphocytes Absolute: 1.8 10*3/uL (ref 0.7–3.1)
Lymphs: 29 %
MCH: 30.5 pg (ref 26.6–33.0)
MCHC: 33.1 g/dL (ref 31.5–35.7)
MCV: 92 fL (ref 79–97)
Monocytes Absolute: 0.5 10*3/uL (ref 0.1–0.9)
Monocytes: 8 %
Neutrophils Absolute: 3.7 10*3/uL (ref 1.4–7.0)
Neutrophils: 59 %
Platelets: 234 10*3/uL (ref 150–450)
RBC: 4.75 x10E6/uL (ref 3.77–5.28)
RDW: 13.8 % (ref 11.7–15.4)
WBC: 6.2 10*3/uL (ref 3.4–10.8)

## 2021-12-23 LAB — LIPID PANEL
Chol/HDL Ratio: 3.3 ratio (ref 0.0–4.4)
Cholesterol, Total: 219 mg/dL — ABNORMAL HIGH (ref 100–199)
HDL: 66 mg/dL (ref >39)
LDL Chol Calc (NIH): 113 mg/dL — ABNORMAL HIGH (ref 0–99)
Triglycerides: 232 mg/dL — ABNORMAL HIGH (ref 0–149)
VLDL Cholesterol Cal: 40 mg/dL (ref 5–40)

## 2021-12-26 IMAGING — CR DG KNEE COMPLETE 4+V*L*
1 series · 4 of 4 positions shown · non-contrast
Comparison: None.

CLINICAL DATA: Atraumatic left knee pain for 6-8 weeks.

EXAM:
LEFT KNEE - COMPLETE 4+ VIEW

[Series 1: dg knee complete 4 views left · 0.14mm/px · 4 of 4 slices shown]
[im 1/4]
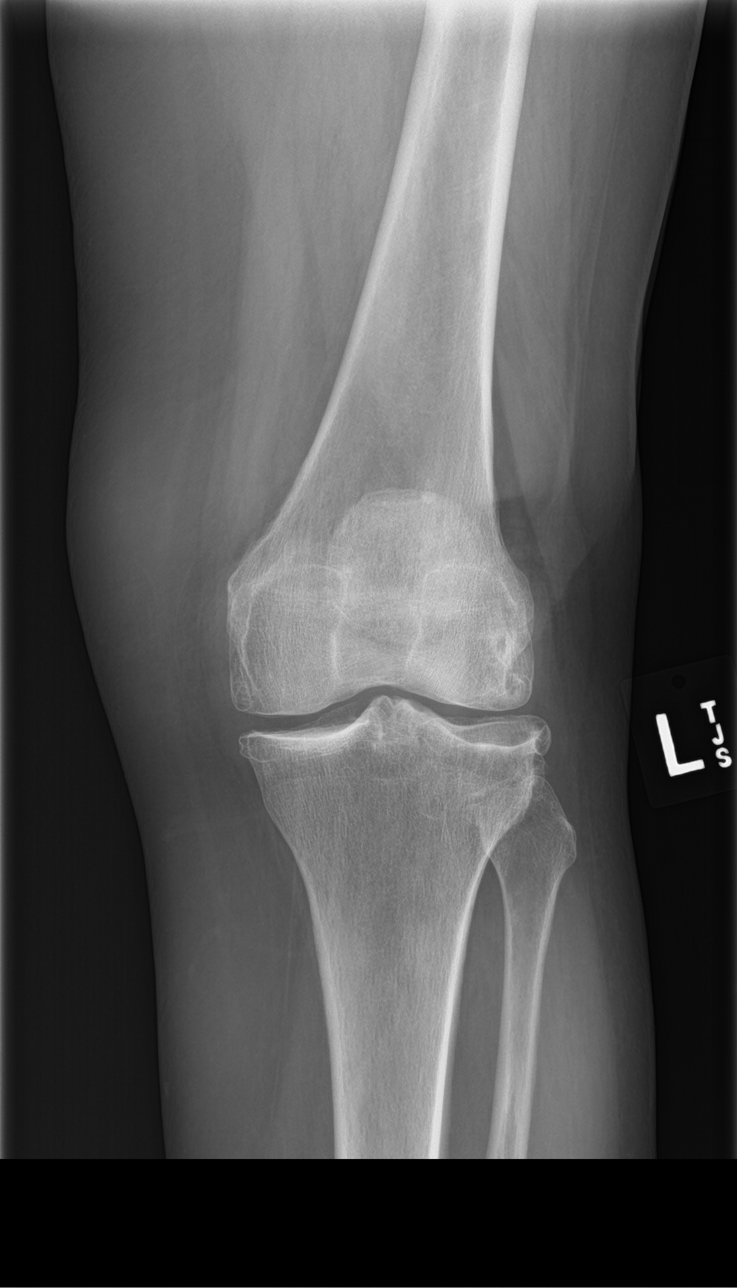
[im 2/4]
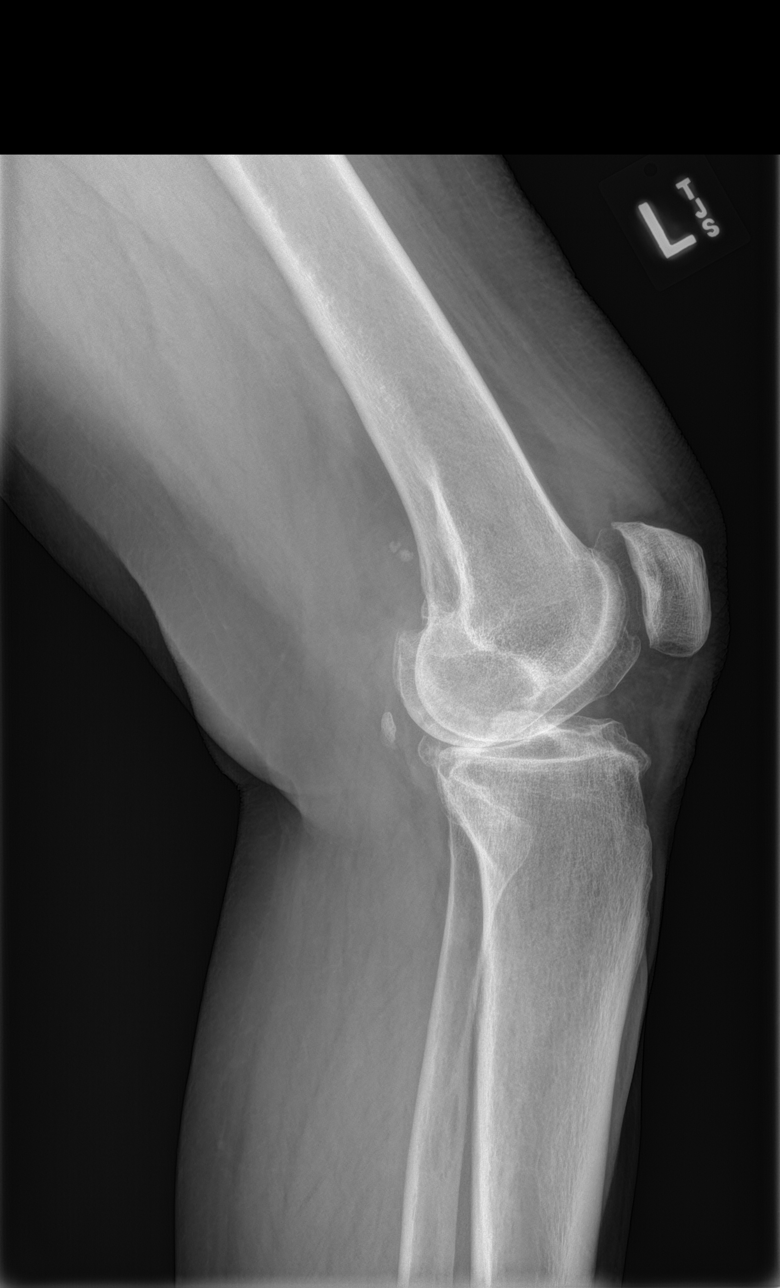
[im 3/4]
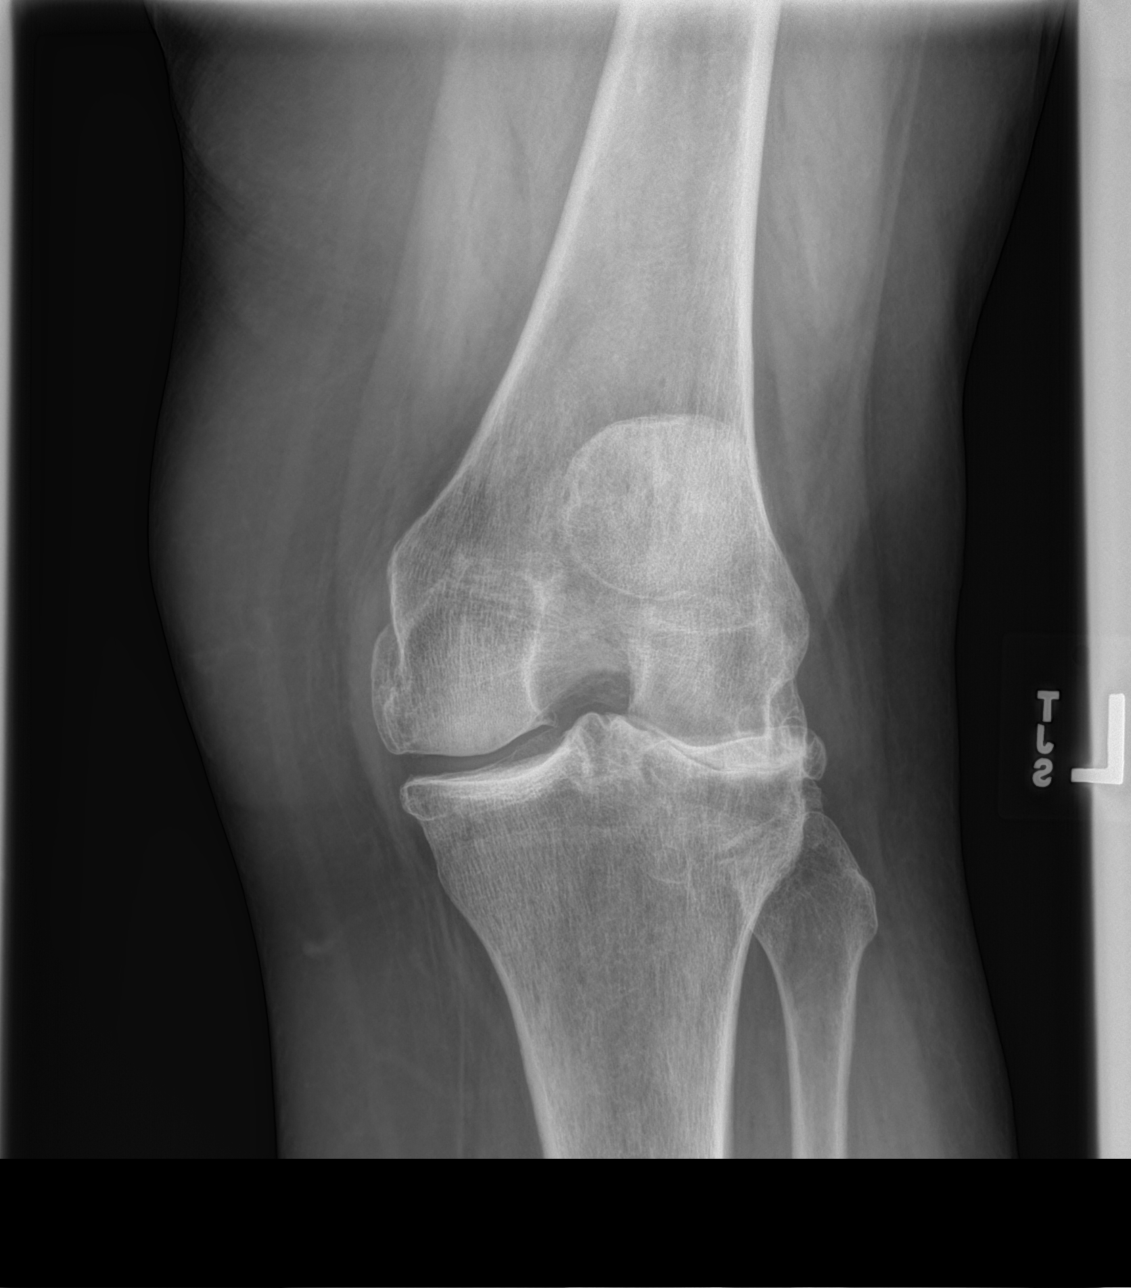
[im 4/4]
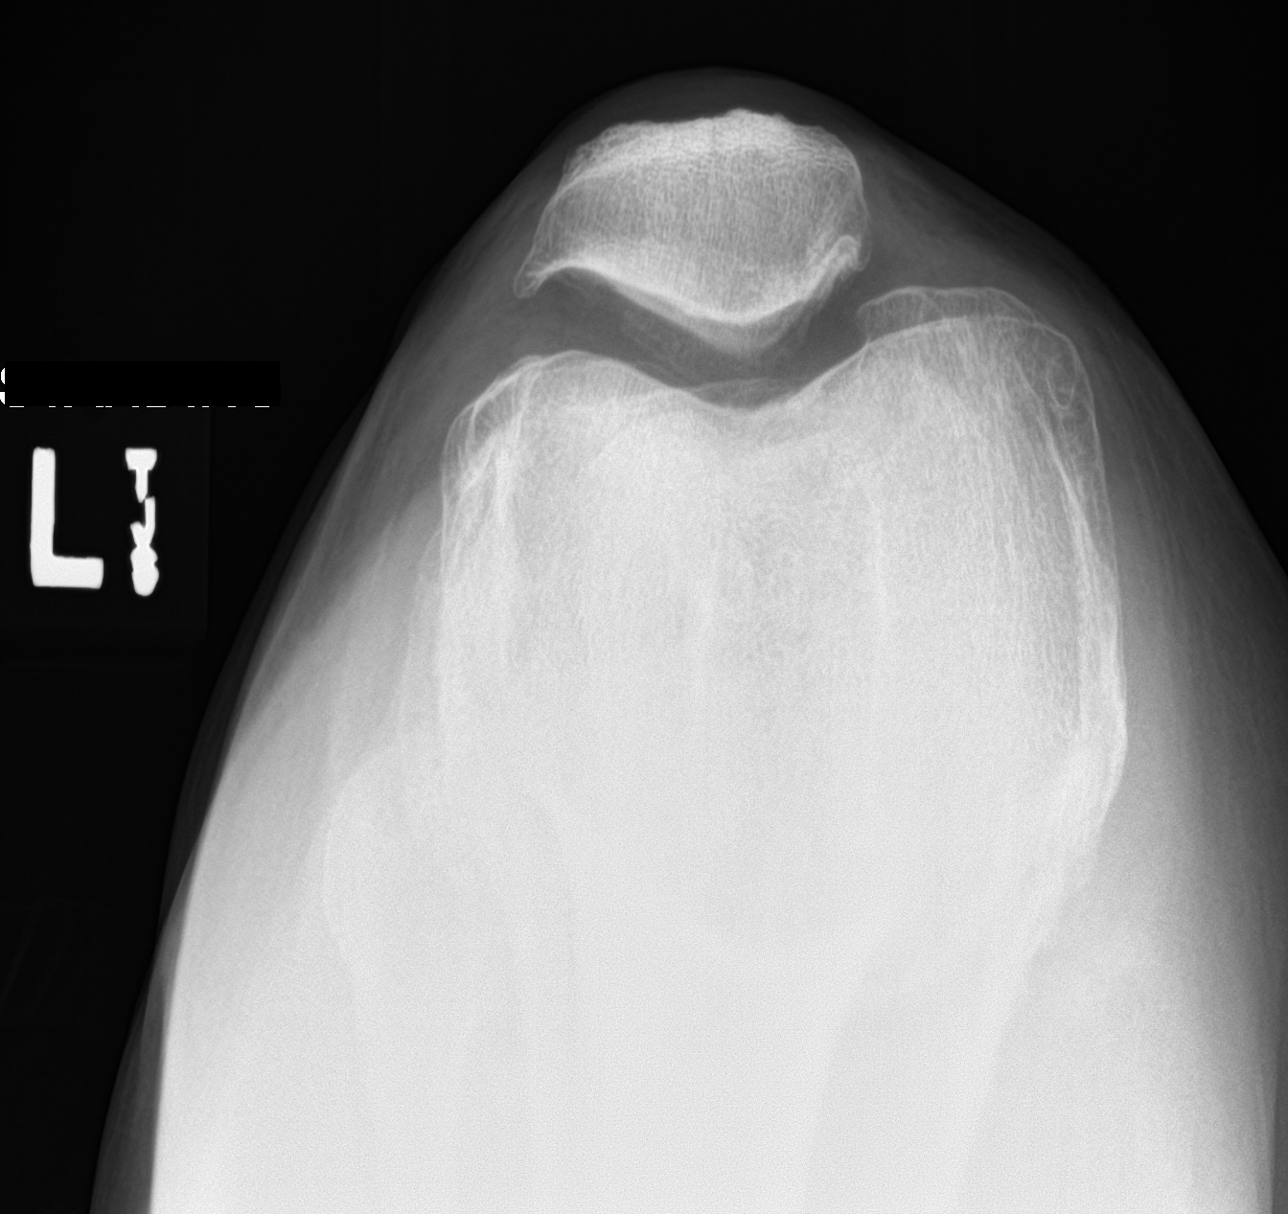

[4 of 4 positions shown; findings below may reference images not displayed]

FINDINGS: No evidence of acute fracture or dislocation. Mild medial and
lateral tibiofemoral compartment space narrowing is seen. A small
joint effusion is noted.
IMPRESSION: Mild degenerative changes with a small joint effusion.

## 2021-12-26 IMAGING — CR DG KNEE COMPLETE 4+V*R*
1 series · 4 of 4 positions shown · non-contrast
Comparison: None.

CLINICAL DATA: Atraumatic posterior right knee pain for 6-8 weeks.

EXAM:
RIGHT KNEE - COMPLETE 4+ VIEW

[Series 1: dg knee complete 4 views right · 0.14mm/px · 4 of 4 slices shown]
[im 1/4]
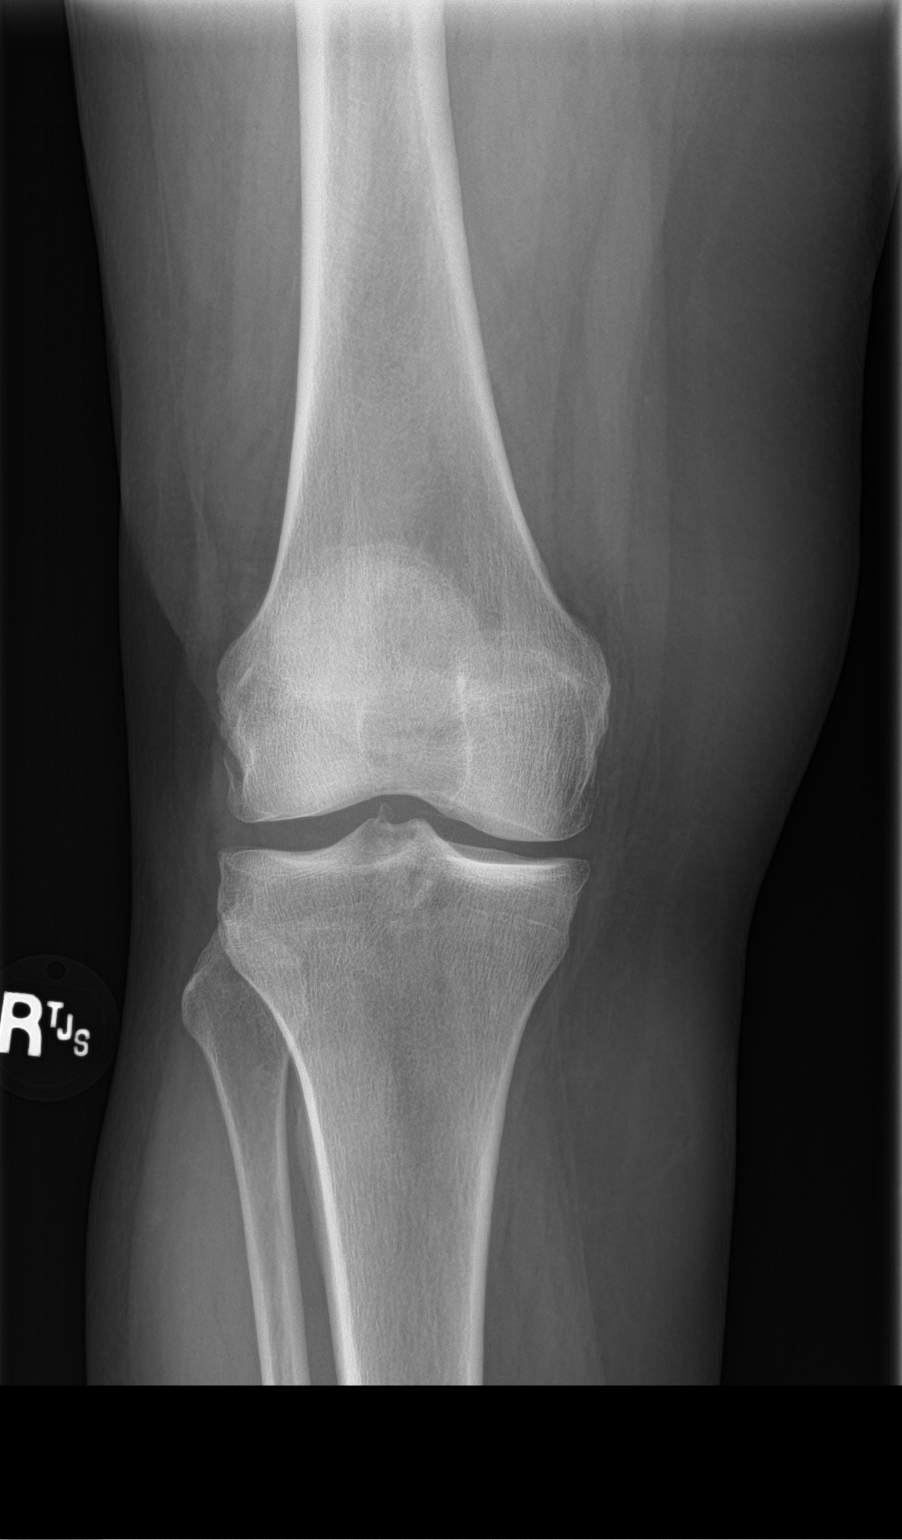
[im 2/4]
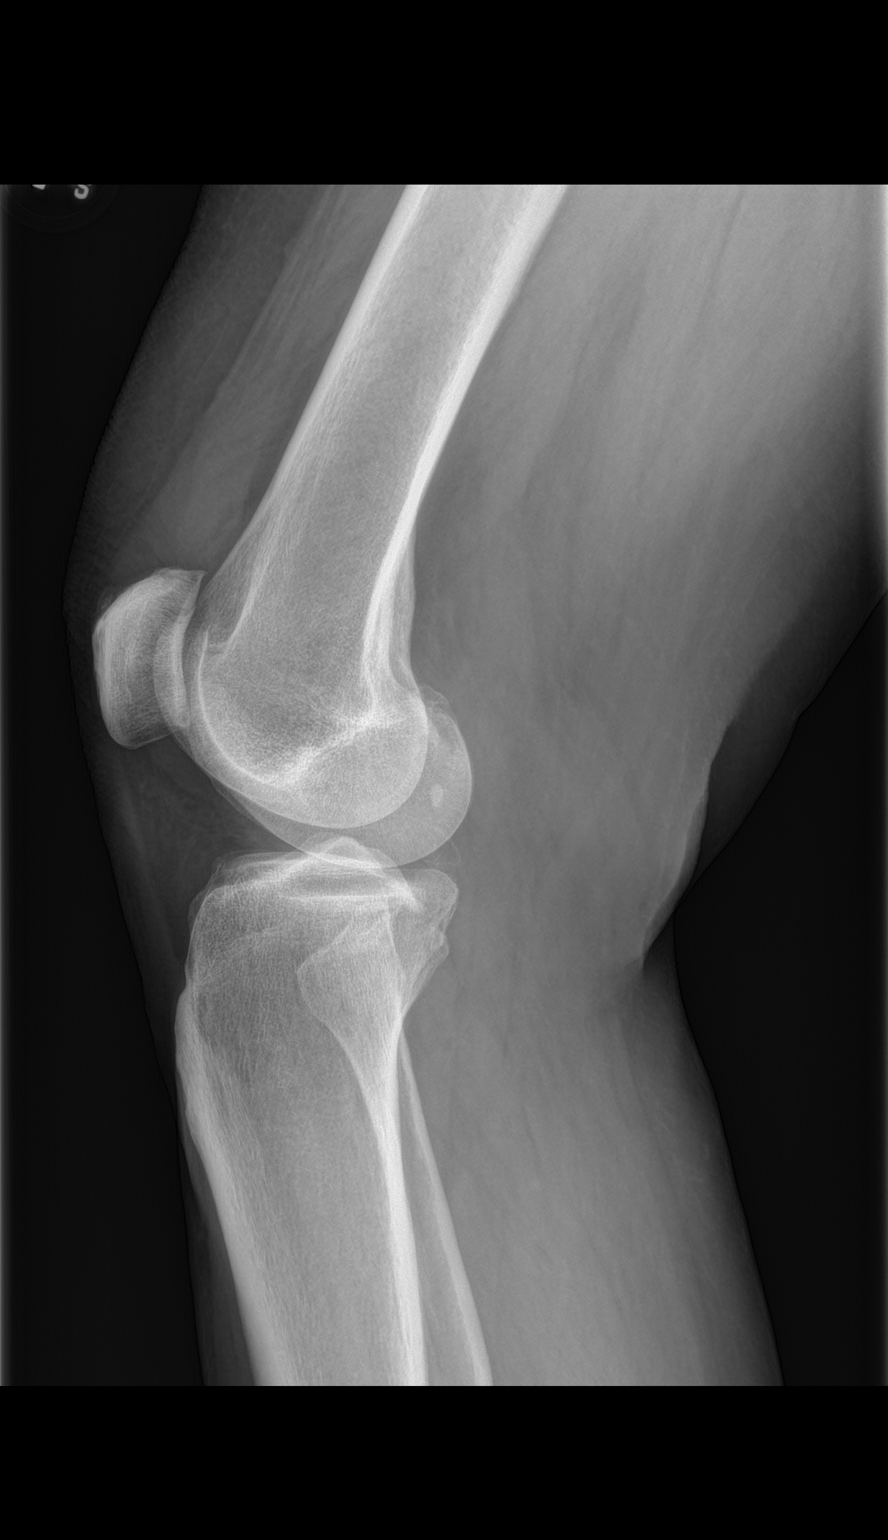
[im 3/4]
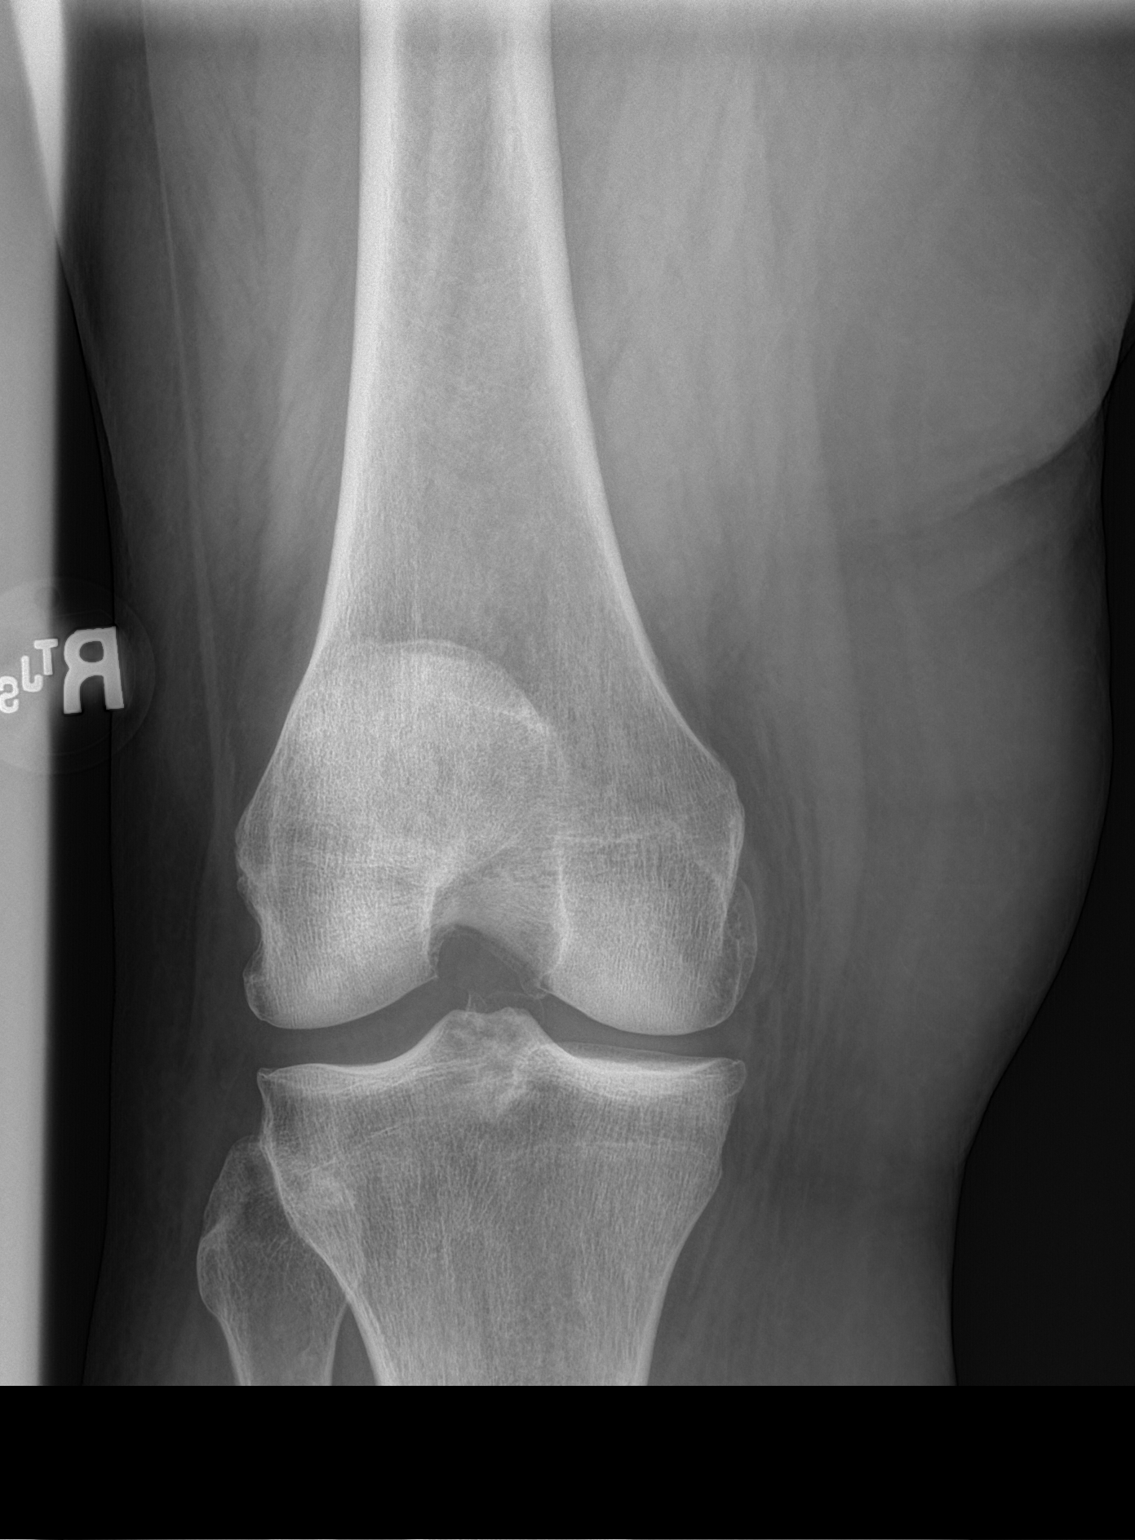
[im 4/4]
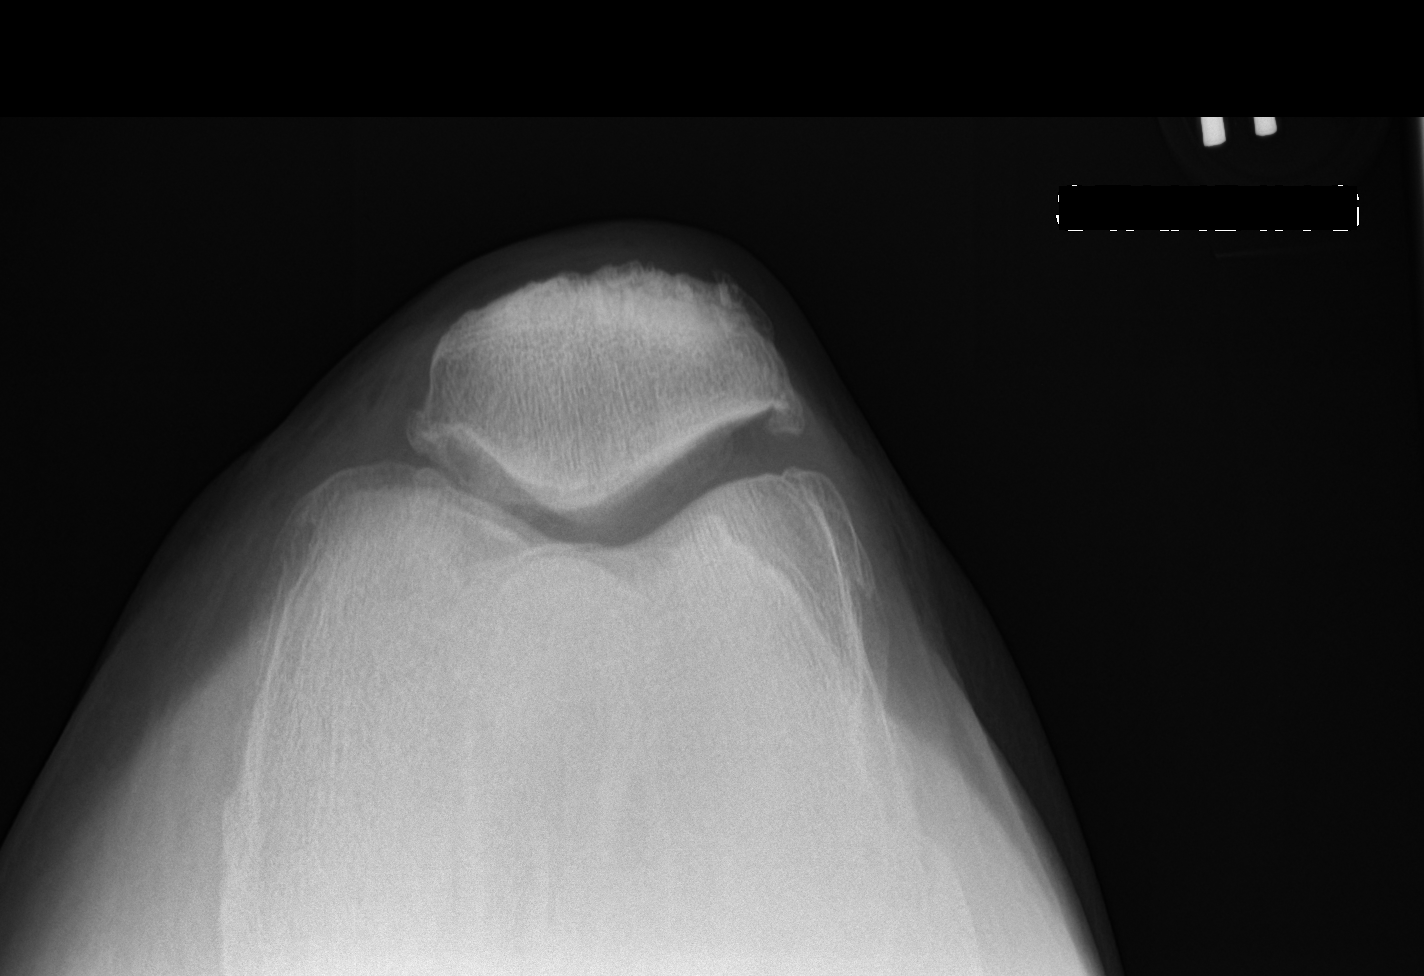

[4 of 4 positions shown; findings below may reference images not displayed]

FINDINGS: No evidence of acute fracture or dislocation. No evidence of
arthropathy or other focal bone abnormality. A small joint effusion
is seen.
IMPRESSION: 1. No acute osseous abnormality.
2. Small joint effusion.

## 2021-12-26 IMAGING — CR DG SHOULDER 2+V*R*
1 series · 3 of 3 positions shown · non-contrast
Comparison: None.

CLINICAL DATA: Right shoulder pain.

EXAM:
RIGHT SHOULDER - 2+ VIEW

[Series 1: dg shoulder right · 0.14mm/px · 3 of 3 slices shown]
[im 1/3]
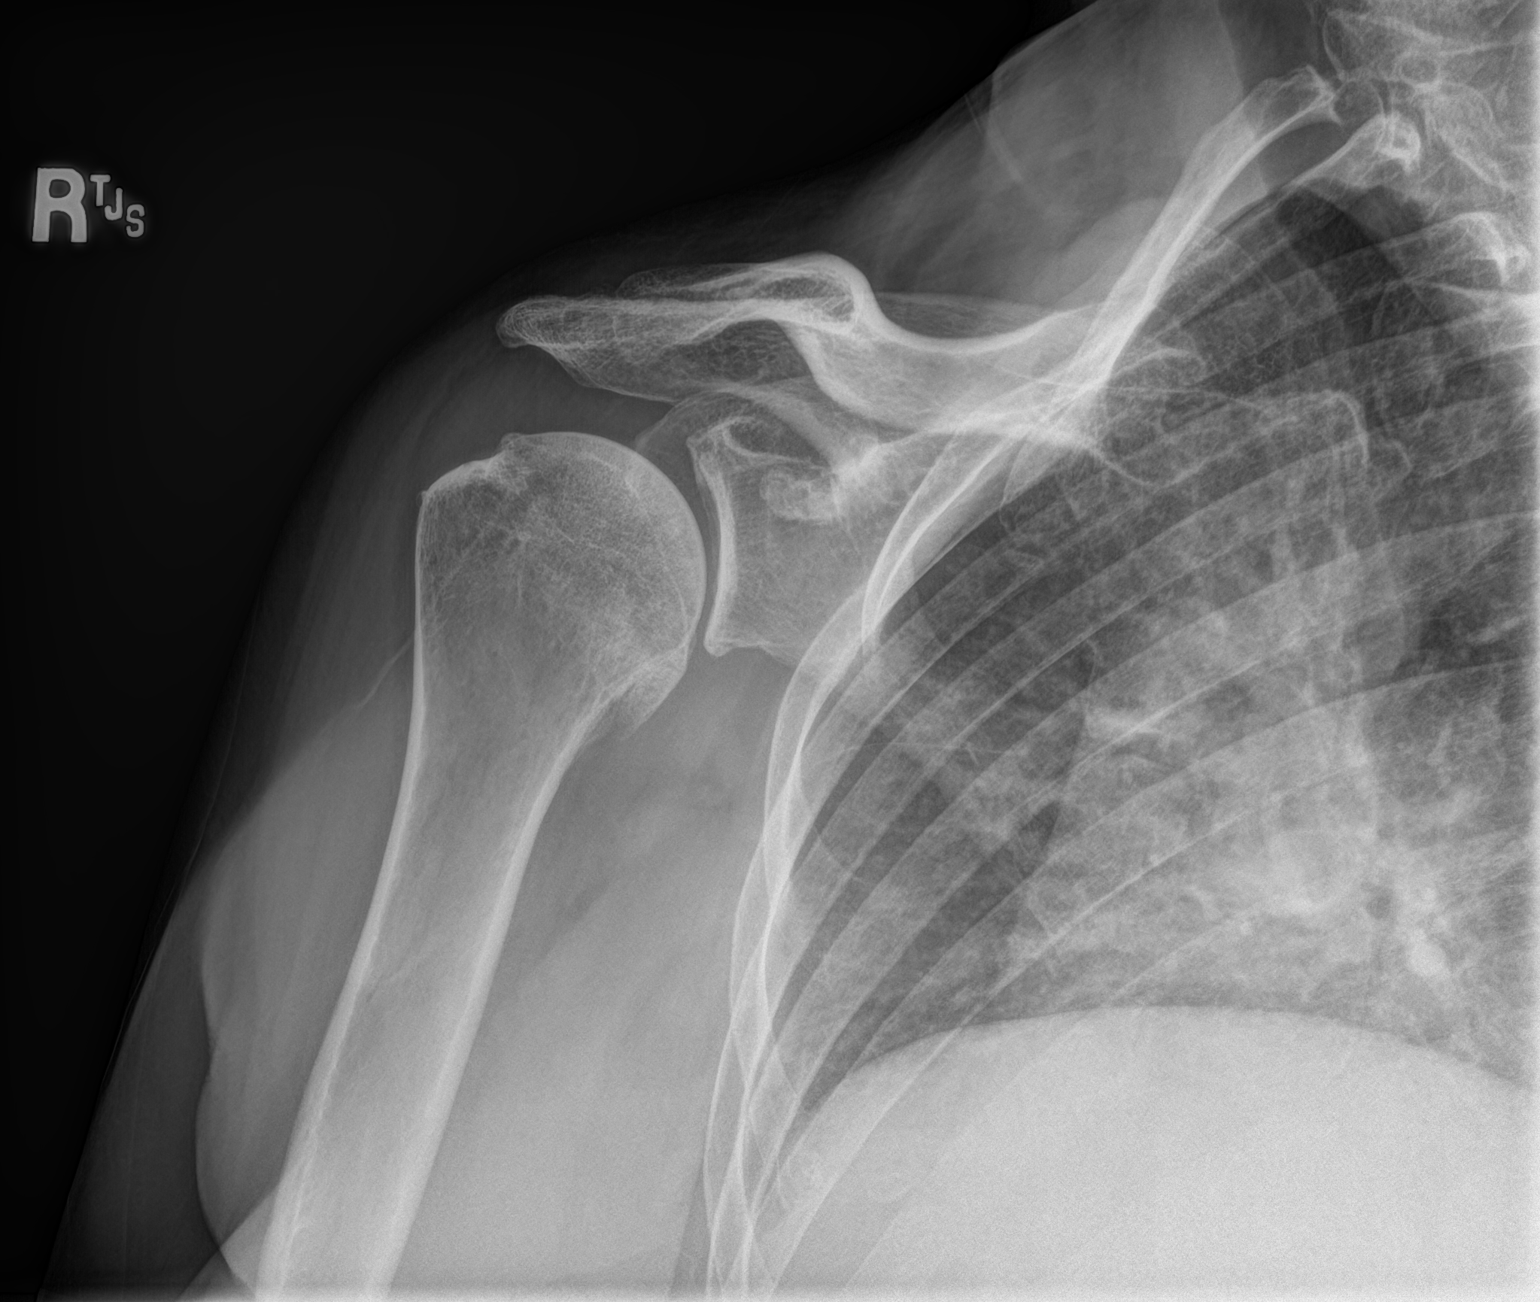
[im 2/3]
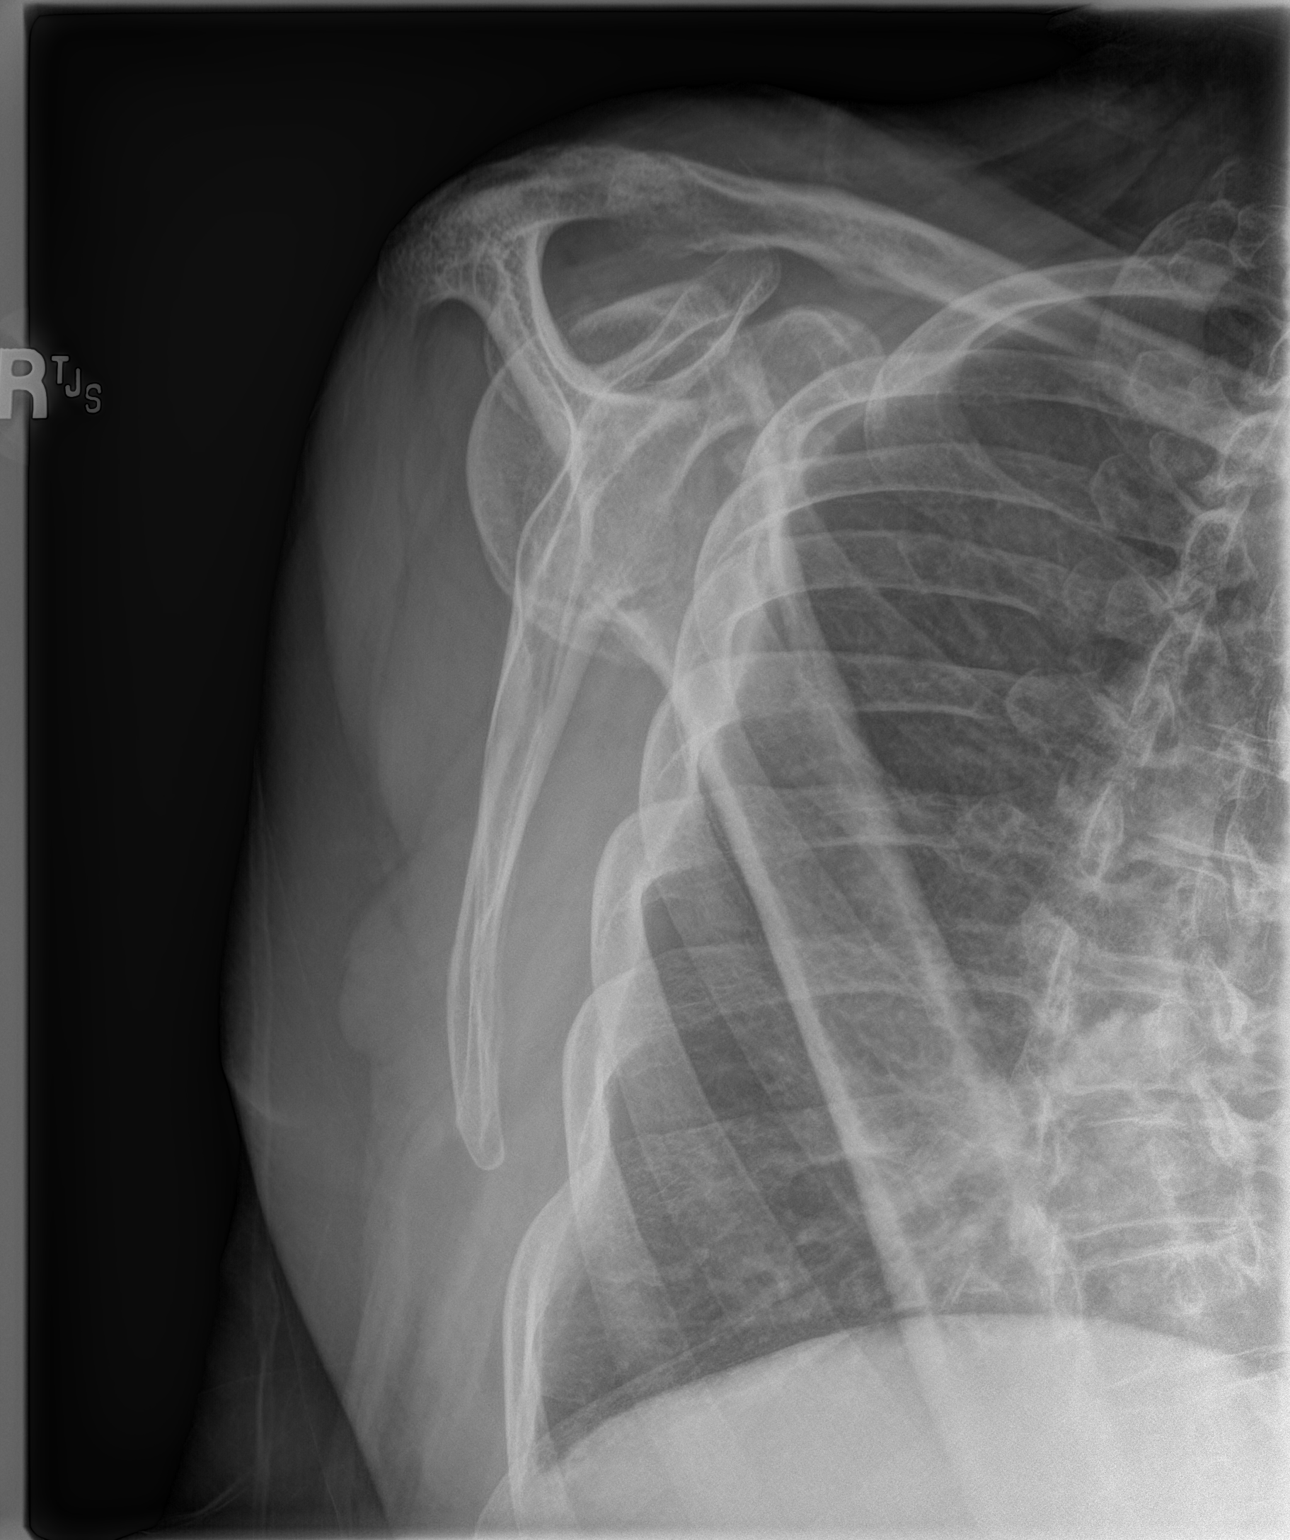
[im 3/3]
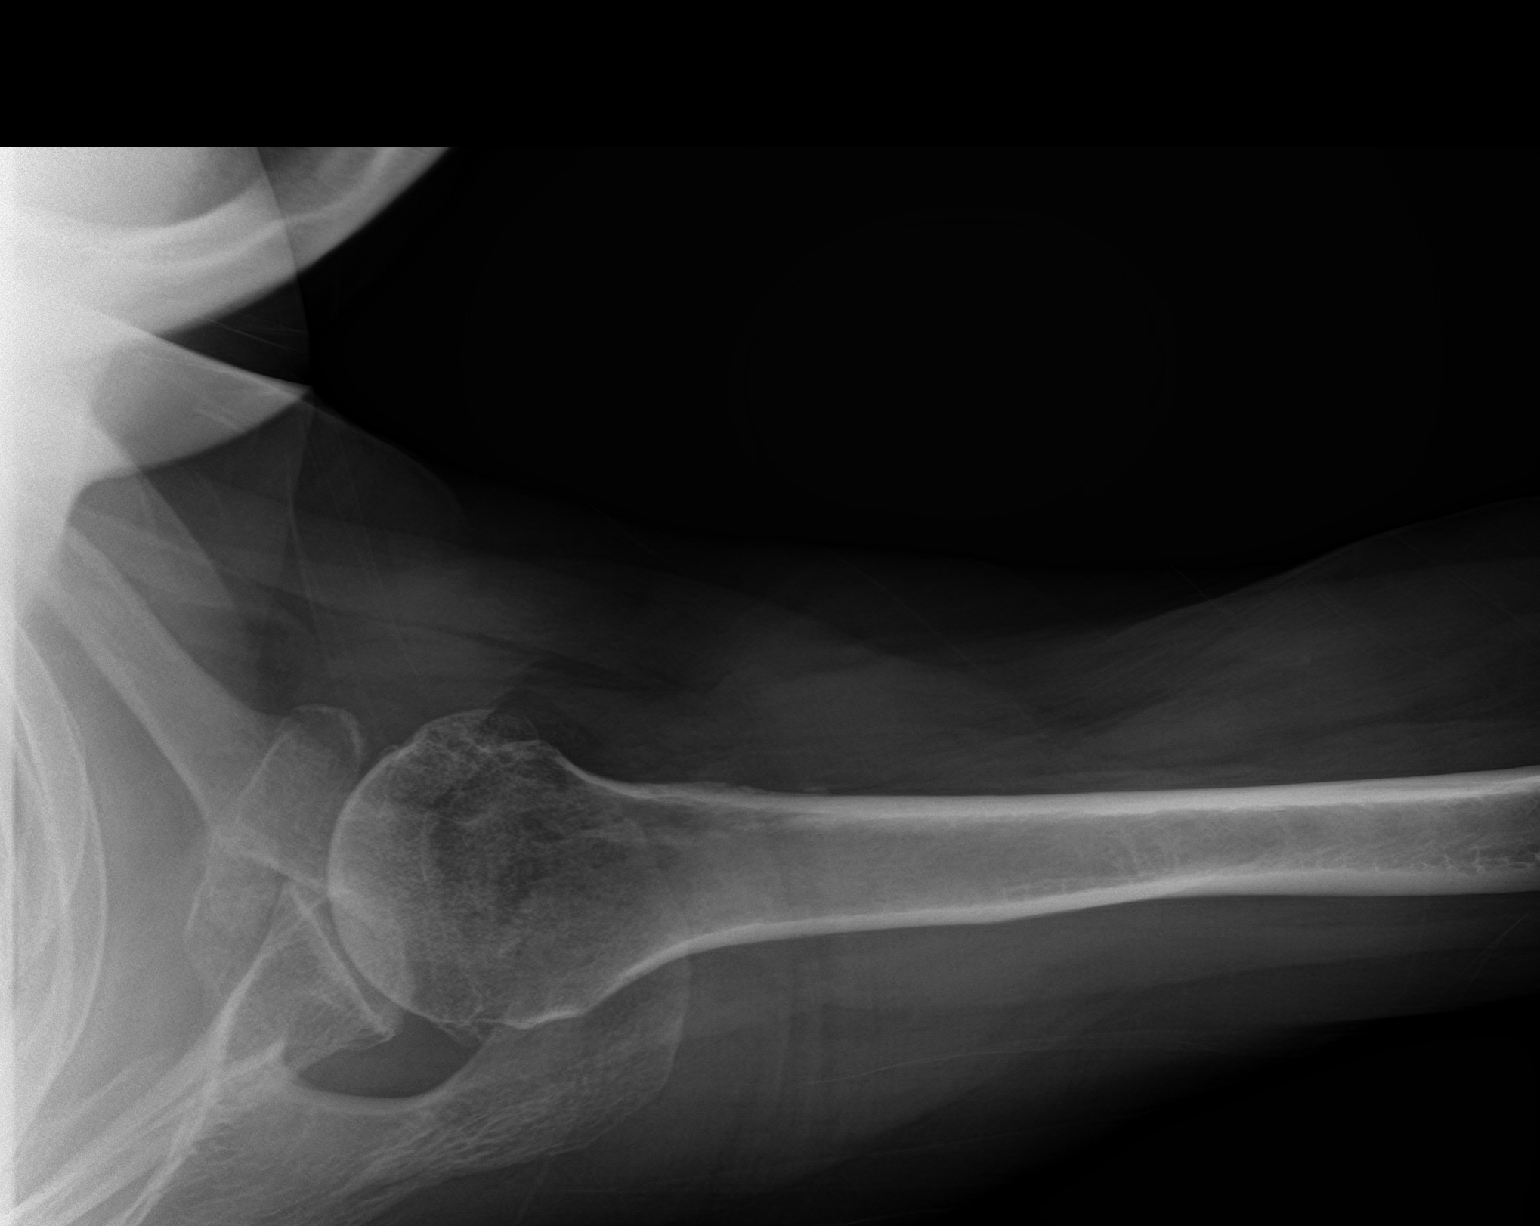

[3 of 3 positions shown; findings below may reference images not displayed]

FINDINGS: There is no evidence of an acute fracture or dislocation. Chronic
changes are seen along the inferior medial aspect of the right
humeral head. Degenerative changes seen involving the right
acromioclavicular joint and right glenohumeral articulation. Soft
tissues are unremarkable.
IMPRESSION: Degenerative changes without acute findings.

## 2021-12-26 IMAGING — CR DG SHOULDER 2+V*L*
1 series · 3 of 3 positions shown · non-contrast
Comparison: None.

CLINICAL DATA: Left shoulder pain.

EXAM:
LEFT SHOULDER - 2+ VIEW

[Series 1: dg shoulder left · 0.14mm/px · 3 of 3 slices shown]
[im 1/3]
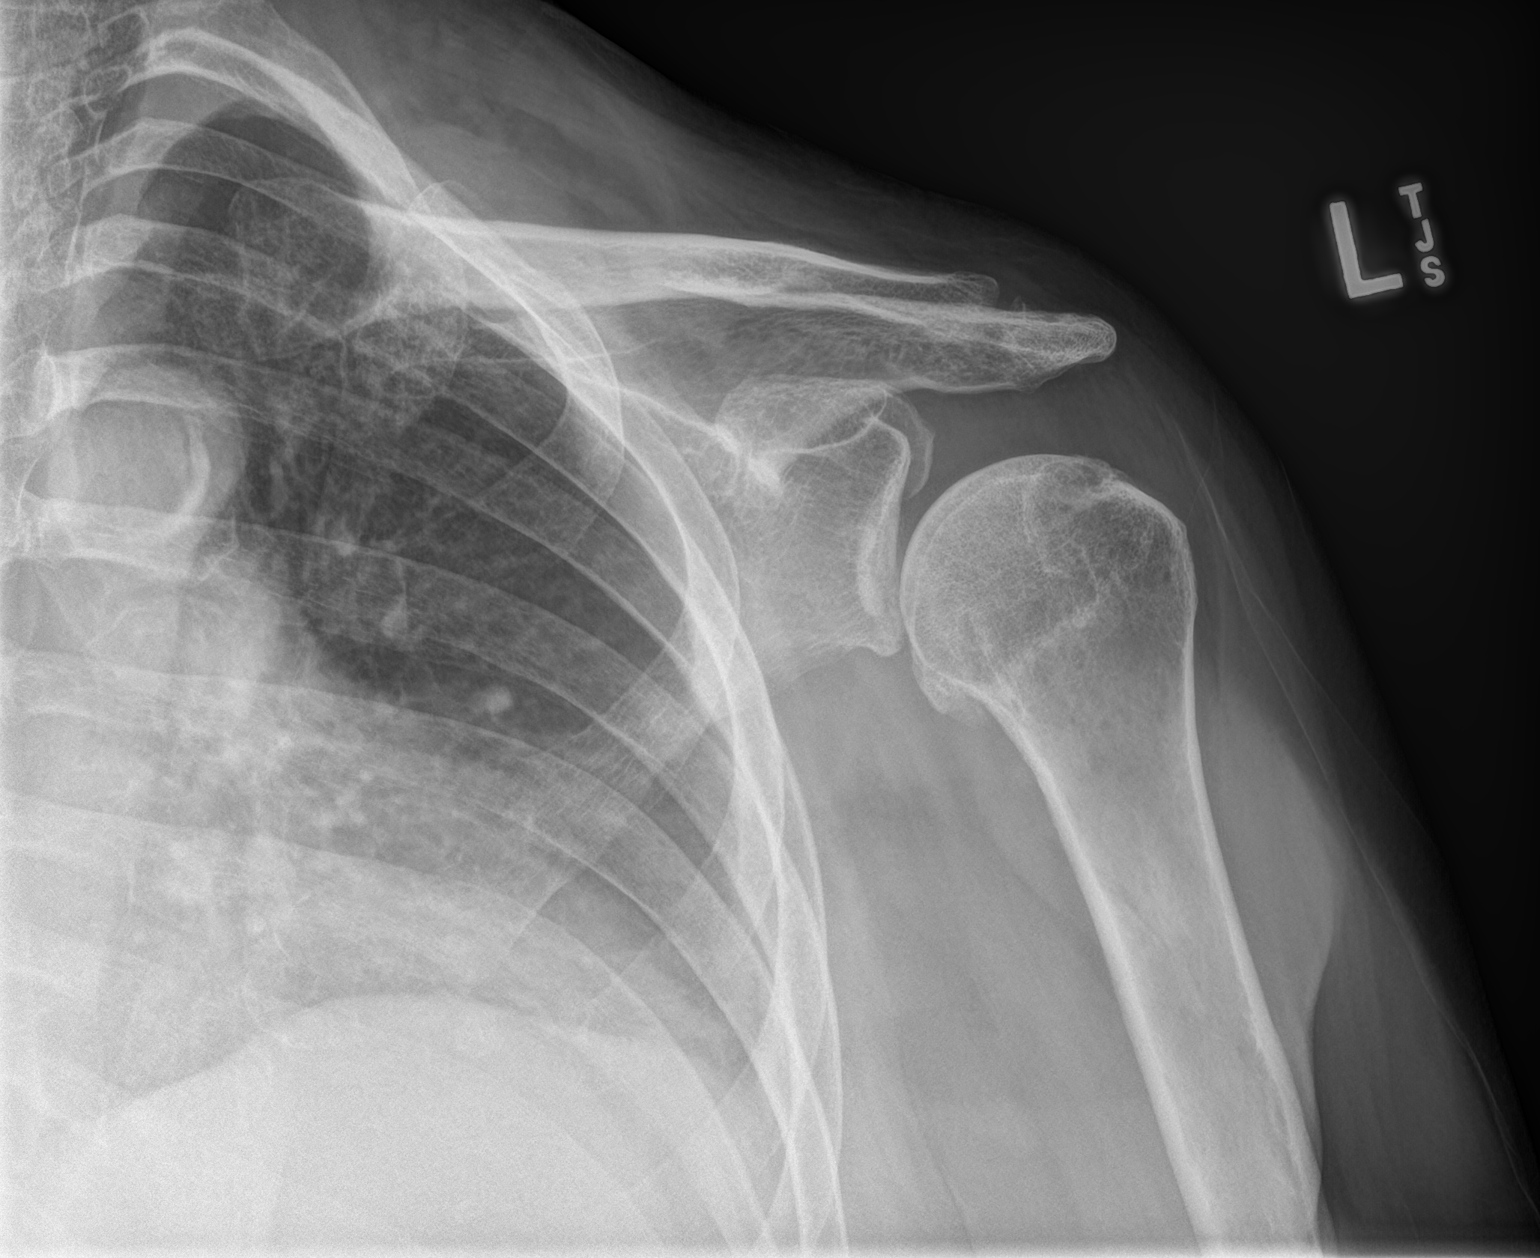
[im 2/3]
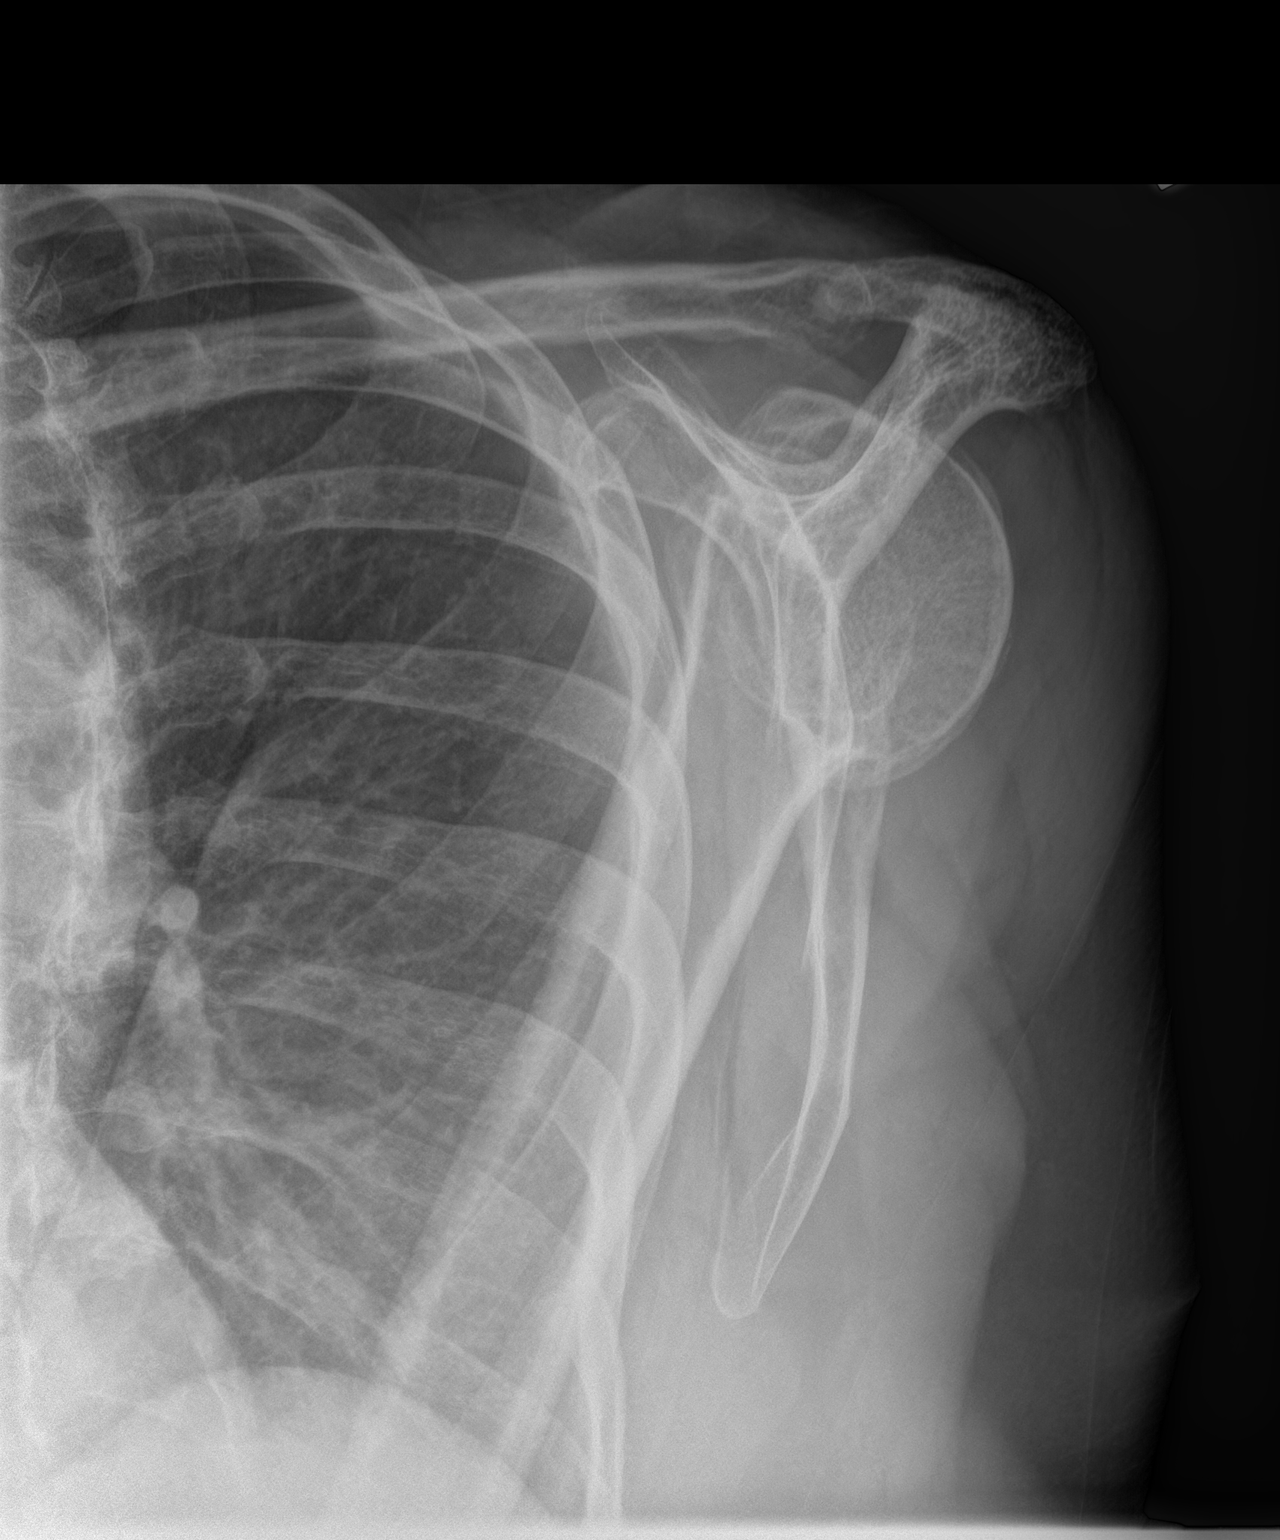
[im 3/3]
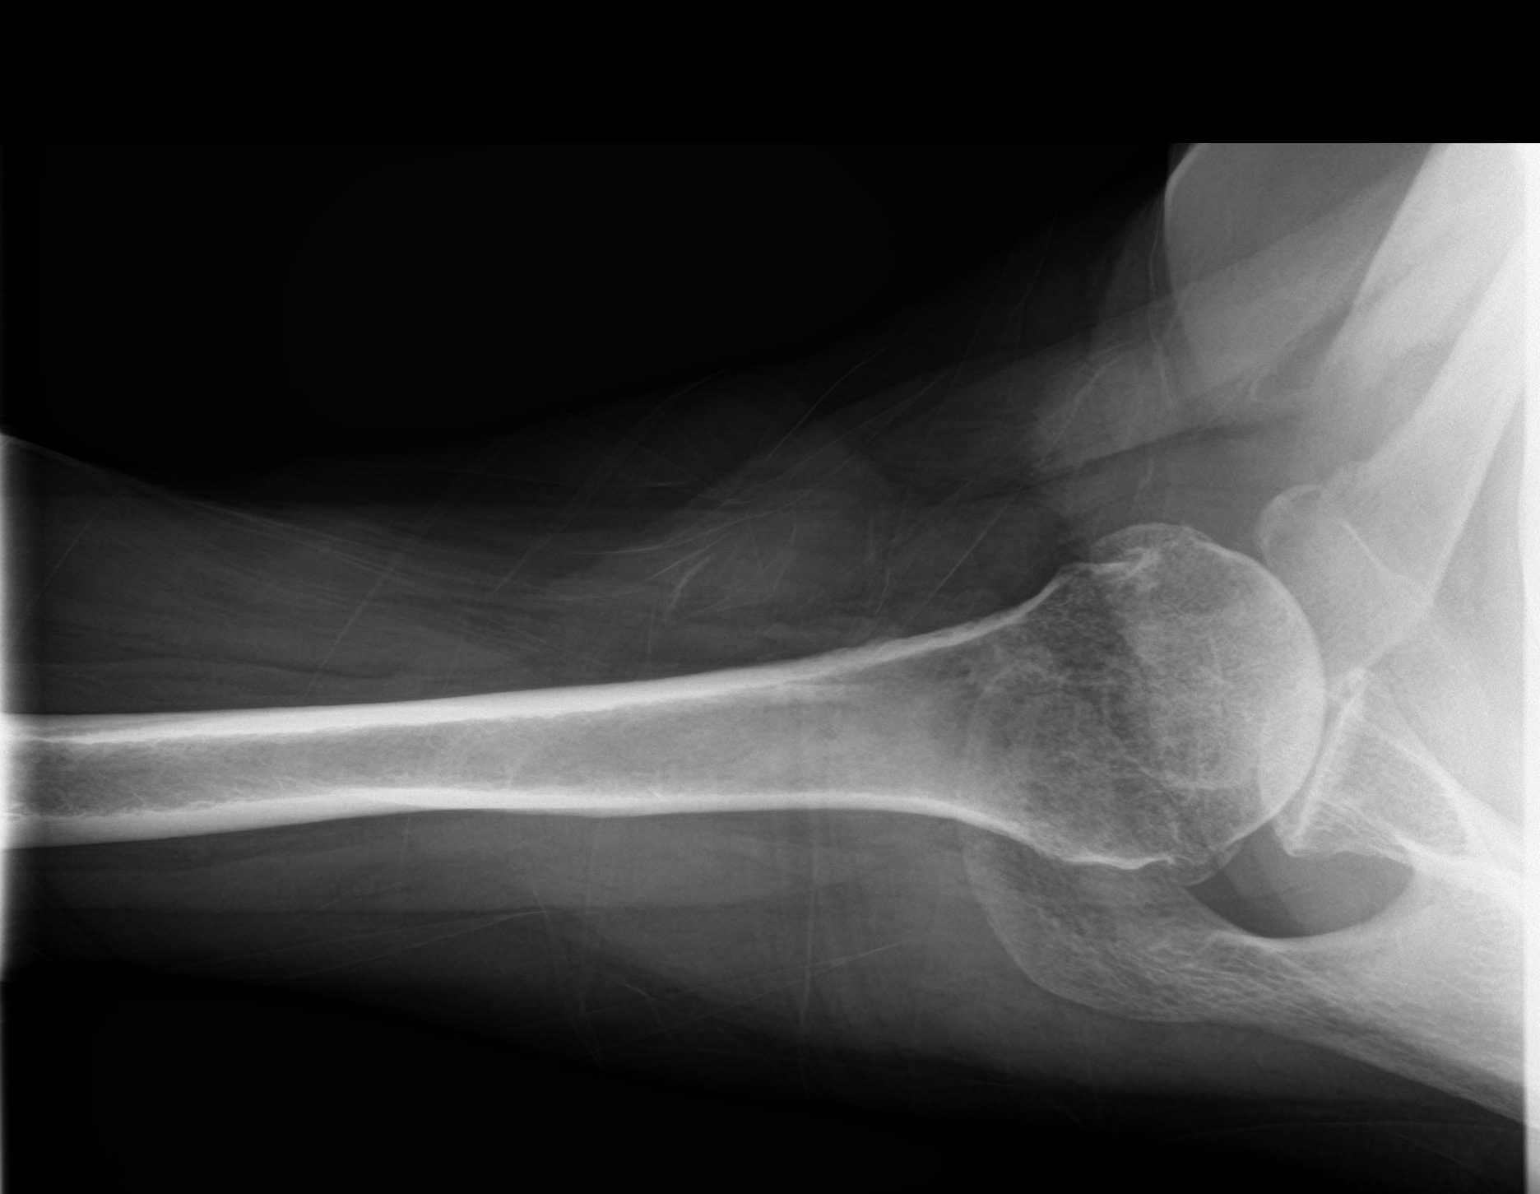

[3 of 3 positions shown; findings below may reference images not displayed]

FINDINGS: There is no evidence of acute fracture or dislocation. Degenerative
changes are seen along the inferior medial aspect of the left
humeral head. Degenerative changes seen involving the left
acromioclavicular joint and left glenohumeral articulation. Soft
tissues are unremarkable.
IMPRESSION: Degenerative changes without evidence of acute fracture or
dislocation.

## 2021-12-29 ENCOUNTER — Ambulatory Visit: Payer: Medicare PPO | Admitting: Internal Medicine

## 2021-12-29 ENCOUNTER — Encounter: Payer: Self-pay | Admitting: Internal Medicine

## 2021-12-29 DIAGNOSIS — E7800 Pure hypercholesterolemia, unspecified: Secondary | ICD-10-CM

## 2021-12-29 DIAGNOSIS — F3175 Bipolar disorder, in partial remission, most recent episode depressed: Secondary | ICD-10-CM

## 2021-12-29 DIAGNOSIS — F419 Anxiety disorder, unspecified: Secondary | ICD-10-CM

## 2021-12-29 MED ORDER — FENOFIBRATE 145 MG PO TABS: 145.0000 mg | ORAL_TABLET | Freq: Every day | ORAL | 5 refills | Status: DC

## 2021-12-29 MED ORDER — ATORVASTATIN CALCIUM 80 MG PO TABS: 80.0000 mg | ORAL_TABLET | Freq: Every day | ORAL | 4 refills | Status: DC

## 2021-12-29 NOTE — Progress Notes (Signed)
? ?BP (!) 146/88 (BP Location: Left Arm, Cuff Size: Normal)   Pulse 64   Temp 97.7 ?F (36.5 ?C) (Oral)   Wt 204 lb 6.4 oz (92.7 kg)   SpO2 99%   BMI 34.47 kg/m?   ? ?Subjective:  ? ? Patient ID: Elizabeth Malone, female    DOB: [birth year], 71 y.o.   MRN: [id number] ? ?Chief Complaint  ?Patient presents with  ?? Depression  ?? Hyperlipidemia  ?? Hypertension  ?? Knee Pain  ?  Pt states she has been having a lot of falls lately with her knee pain. States she has bone on bone in her left knee.   ?? Fatigue  ?  Pt states she has been having a lot of fatigue for the last week, states she has been taking naps and does not normally do this   ? ? ?HPI: ?Elizabeth Malone is a 71 y.o. female ? ?Depression ?       This is a chronic problem.  The current episode started 1 to 4 weeks ago.   Associated symptoms include no headaches.   Pertinent negatives include no hypothyroidism and no anxiety. ?Hyperlipidemia ?This is a chronic problem. The problem is controlled. She has no history of hypothyroidism. Pertinent negatives include no chest pain or shortness of breath.  ?Hypertension ?This is a chronic problem. The current episode started more than 1 year ago. The problem is controlled. Pertinent negatives include no anxiety, blurred vision, chest pain, headaches, malaise/fatigue, neck pain, orthopnea, palpitations, peripheral edema, PND, shortness of breath or sweats.  ?Knee Pain  ?The incident occurred more than 1 week ago. Associated symptoms include an inability to bear weight. Pertinent negatives include no loss of motion, loss of sensation, muscle weakness, numbness or tingling. The treatment provided moderate relief.  ? ?Chief Complaint  ?Patient presents with  ?? Depression  ?? Hyperlipidemia  ?? Hypertension  ?? Knee Pain  ?  Pt states she has been having a lot of falls lately with her knee pain. States she has bone on bone in her left knee.   ?? Fatigue  ?  Pt states she has been having a lot of fatigue for the last week,  states she has been taking naps and does not normally do this   ? ? ?Relevant past medical, surgical, family and social history reviewed and updated as indicated. Interim medical history since our last visit reviewed. ?Allergies and medications reviewed and updated. ? ?Review of Systems  ?Constitutional:  Negative for malaise/fatigue.  ?Eyes:  Negative for blurred vision.  ?Respiratory:  Negative for shortness of breath.   ?Cardiovascular:  Negative for chest pain, palpitations, orthopnea and PND.  ?Musculoskeletal:  Negative for neck pain.  ?Neurological:  Negative for tingling, numbness and headaches.  ?Psychiatric/Behavioral:  Positive for depression.   ? ?Per HPI unless specifically indicated above ? ?   ?Objective:  ?  ?BP (!) 146/88 (BP Location: Left Arm, Cuff Size: Normal)   Pulse 64   Temp 97.7 ?F (36.5 ?C) (Oral)   Wt 204 lb 6.4 oz (92.7 kg)   SpO2 99%   BMI 34.47 kg/m?   ?Wt Readings from Last 3 Encounters:  ?12/29/21 204 lb 6.4 oz (92.7 kg)  ?11/04/21 208 lb (94.3 kg)  ?10/24/21 208 lb (94.3 kg)  ?  ?Physical Exam ?Vitals and nursing note reviewed.  ?Constitutional:   ?   General: She is not in acute distress. ?   Appearance: Normal appearance. She is not  ill-appearing or diaphoretic.  ?Eyes:  ?   Conjunctiva/sclera: Conjunctivae normal.  ?Cardiovascular:  ?   Rate and Rhythm: Normal rate and regular rhythm.  ?   Heart sounds: No murmur heard. ?  No friction rub.  ?Pulmonary:  ?   Breath sounds: No rhonchi.  ?Abdominal:  ?   General: Abdomen is flat. Bowel sounds are normal. There is no distension.  ?   Palpations: Abdomen is soft. There is no mass.  ?   Tenderness: There is no abdominal tenderness. There is no guarding.  ?Skin: ?   General: Skin is warm and dry.  ?   Coloration: Skin is not jaundiced.  ?   Findings: No erythema.  ?Neurological:  ?   Mental Status: She is alert.  ?Psychiatric:     ?   Mood and Affect: Mood normal.  ? ? ?Results for orders placed or performed in visit on 12/22/21   ?Lipid panel  ?Result Value Ref Range  ? Cholesterol, Total 219 (H) 100 - 199 mg/dL  ? Triglycerides 232 (H) 0 - 149 mg/dL  ? HDL 66 >39 mg/dL  ? VLDL Cholesterol Cal 40 5 - 40 mg/dL  ? LDL Chol Calc (NIH) 113 (H) 0 - 99 mg/dL  ? Chol/HDL Ratio 3.3 0.0 - 4.4 ratio  ?CBC with Differential/Platelet  ?Result Value Ref Range  ? WBC 6.2 3.4 - 10.8 x10E3/uL  ? RBC 4.75 3.77 - 5.28 x10E6/uL  ? Hemoglobin 14.5 11.1 - 15.9 g/dL  ? Hematocrit 43.8 34.0 - 46.6 %  ? MCV 92 79 - 97 fL  ? MCH 30.5 26.6 - 33.0 pg  ? MCHC 33.1 31.5 - 35.7 g/dL  ? RDW 13.8 11.7 - 15.4 %  ? Platelets 234 150 - 450 x10E3/uL  ? Neutrophils 59 Not Estab. %  ? Lymphs 29 Not Estab. %  ? Monocytes 8 Not Estab. %  ? Eos 2 Not Estab. %  ? Basos 1 Not Estab. %  ? Neutrophils Absolute 3.7 1.4 - 7.0 x10E3/uL  ? Lymphocytes Absolute 1.8 0.7 - 3.1 x10E3/uL  ? Monocytes Absolute 0.5 0.1 - 0.9 x10E3/uL  ? EOS (ABSOLUTE) 0.1 0.0 - 0.4 x10E3/uL  ? Basophils Absolute 0.0 0.0 - 0.2 x10E3/uL  ? Immature Granulocytes 1 Not Estab. %  ? Immature Grans (Abs) 0.0 0.0 - 0.1 x10E3/uL  ?Thyroid Panel With TSH  ?Result Value Ref Range  ? TSH 1.100 0.450 - 4.500 uIU/mL  ? T4, Total 7.1 4.5 - 12.0 ug/dL  ? T3 Uptake Ratio 26 24 - 39 %  ? Free Thyroxine Index 1.8 1.2 - 4.9  ?Bayer DCA Hb A1c Waived  ?Result Value Ref Range  ? HB A1C (BAYER DCA - WAIVED) 5.3 4.8 - 5.6 %  ?Comprehensive metabolic panel  ?Result Value Ref Range  ? Glucose 103 (H) 70 - 99 mg/dL  ? BUN 10 8 - 27 mg/dL  ? Creatinine, Ser 0.82 0.57 - 1.00 mg/dL  ? eGFR 77 >59 mL/min/1.73  ? BUN/Creatinine Ratio 12 12 - 28  ? Sodium 142 134 - 144 mmol/L  ? Potassium 4.2 3.5 - 5.2 mmol/L  ? Chloride 104 96 - 106 mmol/L  ? CO2 27 20 - 29 mmol/L  ? Calcium 10.0 8.7 - 10.3 mg/dL  ? Total Protein 6.9 6.0 - 8.5 g/dL  ? Albumin 4.6 3.8 - 4.8 g/dL  ? Globulin, Total 2.3 1.5 - 4.5 g/dL  ? Albumin/Globulin Ratio 2.0 1.2 - 2.2  ? Bilirubin Total  0.5 0.0 - 1.2 mg/dL  ? Alkaline Phosphatase 140 (H) 44 - 121 IU/L  ? AST 18 0 - 40 IU/L   ? ALT 28 0 - 32 IU/L  ? ?   ? ? ?Current Outpatient Medications:  ??  benazepril (LOTENSIN) 10 MG tablet, Take 1 tablet (10 mg total) by mouth daily., Disp: 30 tablet, Rfl: 3 ??  Calcium Citrate-Vitamin D (CALCIUM + D PO), Take 1 tablet by mouth 2 (two) times daily., Disp: , Rfl:  ??  fenofibrate (TRICOR) 145 MG tablet, Take 1 tablet (145 mg total) by mouth daily., Disp: 30 tablet, Rfl: 5 ??  fexofenadine (ALLERGY RELIEF) 180 MG tablet, TAKE 1 TABLET(180 MG) BY MOUTH DAILY, Disp: 90 tablet, Rfl: 4 ??  fluticasone (FLONASE) 50 MCG/ACT nasal spray, SHAKE LIQUID AND USE 2 SPRAYS IN EACH NOSTRIL DAILY, Disp: 16 g, Rfl: 2 ??  metoprolol succinate (TOPROL-XL) 50 MG 24 hr tablet, Take 1 tablet (50 mg total) by mouth daily., Disp: 30 tablet, Rfl: 4 ??  montelukast (SINGULAIR) 10 MG tablet, TAKE 1 TABLET(10 MG) BY MOUTH AT BEDTIME, Disp: 90 tablet, Rfl: 0 ??  Multiple Vitamin (MULTIVITAMIN) tablet, Take 1 tablet by mouth daily., Disp: , Rfl:  ??  Omega-3 Fatty Acids (FISH OIL) 1000 MG CAPS, Take 1,000 mg by mouth 2 (two) times daily., Disp: , Rfl:  ??  omeprazole (PRILOSEC) 40 MG capsule, Take 1 capsule (40 mg total) by mouth daily., Disp: 90 capsule, Rfl: 4 ??  oxybutynin (DITROPAN-XL) 10 MG 24 hr tablet, TAKE 2 TABLETS AT BEDTIME., Disp: 180 tablet, Rfl: 4 ??  QUEtiapine (SEROQUEL) 100 MG tablet, Take 1 tablet (100 mg total) by mouth at bedtime., Disp: 90 tablet, Rfl: 1 ??  valACYclovir (VALTREX) 1000 MG tablet, Take 1 tablet (1,000 mg total) by mouth 2 (two) times daily., Disp: 20 tablet, Rfl: 0 ??  atorvastatin (LIPITOR) 80 MG tablet, Take 1 tablet (80 mg total) by mouth daily., Disp: 90 tablet, Rfl: 4 ??  venlafaxine XR (EFFEXOR XR) 75 MG 24 hr capsule, Take 3 capsules (225 mg total) by mouth daily., Disp: 270 capsule, Rfl: 1  ? ? ?Assessment & Plan:  ?1.HLD  ?Is on lipitor for such , tg very high still  ?recheck FLP, check LFT's work on diet, SE of meds explained to pt. low fat and high fiber diet explained to  pt. ? ?2. Arthritis :  ?Multiple joint pain seeing ortho now for such check xrays of swollen joints. ?pt takes pain meds for such. ?Is getting joint injections for such  ?Is getting pool exercises.  ?PT didn't help much.

## 2022-01-02 ENCOUNTER — Telehealth: Payer: Self-pay | Admitting: Internal Medicine

## 2022-01-02 ENCOUNTER — Ambulatory Visit: Payer: Self-pay

## 2022-01-02 NOTE — Telephone Encounter (Signed)
Pt asking when she can come to office and hug Dr. Neomia Dear goodbye. Please review and notify pt. ?

## 2022-01-02 NOTE — Telephone Encounter (Signed)
Reason for Disposition ? [1] Caller has URGENT medicine question about med that PCP or specialist prescribed AND [2] triager unable to answer question ? ?Answer Assessment - Initial Assessment Questions ?1. NAME of MEDICATION: "What medicine are you calling about?" ?    Taking fenofibrate and atorvastatin together and Meloxicam ?2. QUESTION: "What is your question?" (e.g., double dose of medicine, side effect) ?    Pt wants to know if she can take fenofibrate and atorvastatin without affecting her liver ?Same concern with Meloxicam. Pt stated that her liver enzymes were elevated  ?3. PRESCRIBING HCP: "Who prescribed it?" Reason: if prescribed by specialist, call should be referred to that group. ?    Dr. Neomia Dear ?4. SYMPTOMS: "Do you have any symptoms?" ?    no ?5. SEVERITY: If symptoms are present, ask "Are they mild, moderate or severe?" ?    *No Answer* ?6. PREGNANCY:  "Is there any chance that you are pregnant?" "When was your last menstrual period?" ?    *No Answer* ? ?Protocols used: Medication Question Call-A-AH ? ?

## 2022-01-02 NOTE — Telephone Encounter (Signed)
?  Chief Complaint: worried about any potential effect to her liver taking atorvastatin and fenofibrate together. Same concern with Meloxicam ?Symptoms: no ?Frequency: no ?Pertinent Negatives: Patient denies sx ?Disposition: '[]'$ ED /'[]'$ Urgent Care (no appt availability in office) / '[]'$ Appointment(In office/virtual)/ '[]'$  [facility]/ '[]'$ Home Care/ '[]'$ Refused Recommended Disposition /'[]'$ [facility]/ '[x]'$  Follow-up with PCP ?Additional Notes: BP check appt changed to 12/21/21 with Erin Mecum PA.  ? ?

## 2022-01-05 ENCOUNTER — Other Ambulatory Visit: Payer: Self-pay | Admitting: Internal Medicine

## 2022-01-05 ENCOUNTER — Other Ambulatory Visit: Payer: Self-pay | Admitting: Nurse Practitioner

## 2022-01-05 NOTE — Telephone Encounter (Signed)
Patient scheduled with Dr. Neomia Dear 5/4 ?

## 2022-01-05 NOTE — Telephone Encounter (Signed)
Returned patient call to advise provider is not in office today, changed patient appointment see Dr. Neomia Dear on 01/15/22.  ?

## 2022-01-06 NOTE — Telephone Encounter (Signed)
Requested medications are due for refill today.  no ? ?Requested medications are on the active medications list.  yes ? ?Last refill. 01/06/2022 16g 7 refills ? ?Future visit scheduled.   yes ? ?Notes to clinic.  Medication refill is not delegated. Medication was refilled today. ? ? ? ?Requested Prescriptions  ?Pending Prescriptions Disp Refills  ? fluticasone (FLONASE) 50 MCG/ACT nasal spray [Pharmacy Med Name: FLUTICASONE 50MCG NASAL SP (120) RX] 16 g 2  ?  Sig: SHAKE LIQUID AND USE 2 SPRAYS IN EACH NOSTRIL DAILY  ?  ? Not Delegated - Ear, Nose, and Throat: Nasal Preparations - Corticosteroids Failed - 01/05/2022 10:06 AM  ?  ?  Failed - This refill cannot be delegated  ?  ?  Passed - Valid encounter within last 12 months  ?  Recent Outpatient Visits   ? ?      ? 1 week ago Hypercholesteremia  ? [facility] Vigg, Avanti, MD  ? 2 months ago Chronic pain of left knee  ? [facility] Vigg, Avanti, MD  ? 2 months ago Arthritis  ? [facility] Vigg, Avanti, MD  ? 3 months ago Essential hypertension  ? [facility] Vigg, Avanti, MD  ? 3 months ago Encounter for screening for malignant neoplasm of breast, unspecified screening modality  ? [facility] Vigg, Avanti, MD  ? ?  ?  ?Future Appointments   ? ?        ? In 1 week Vigg, Avanti, MD [facility]  ? In 3 months  [place]  ? ?  ? ? ?  ?  ?  ?  ?

## 2022-01-06 NOTE — Telephone Encounter (Signed)
Requested medication (s) are due for refill today:   Provider to review ? ?Requested medication (s) are on the active medication list:   Yes ? ?Future visit scheduled:   Yes ? ? ?Last ordered: 07/22/2020 16 g, 2 refills     However duplicate request from pharmacy is 01/05/2022 16 g, 2 refills ? ?Returned because it's a non delegated refill.  ? ?Requested Prescriptions  ?Pending Prescriptions Disp Refills  ? fluticasone (FLONASE) 50 MCG/ACT nasal spray [Pharmacy Med Name: FLUTICASONE 50MCG NASAL SP (120) RX] 16 g 2  ?  Sig: SHAKE LIQUID AND USE 2 SPRAYS IN EACH NOSTRIL DAILY  ?  ? Not Delegated - Ear, Nose, and Throat: Nasal Preparations - Corticosteroids Failed - 01/05/2022 10:06 AM  ?  ?  Failed - This refill cannot be delegated  ?  ?  Passed - Valid encounter within last 12 months  ?  Recent Outpatient Visits   ? ?      ? 1 week ago Hypercholesteremia  ? [facility] Vigg, Avanti, MD  ? 2 months ago Chronic pain of left knee  ? [facility] Vigg, Avanti, MD  ? 2 months ago Arthritis  ? [facility] Vigg, Avanti, MD  ? 3 months ago Essential hypertension  ? [facility] Vigg, Avanti, MD  ? 3 months ago Encounter for screening for malignant neoplasm of breast, unspecified screening modality  ? [facility] Vigg, Avanti, MD  ? ?  ?  ?Future Appointments   ? ?        ? In 1 week Vigg, Avanti, MD [facility]  ? In 3 months  [place], [facility]  ? ?  ? ? ?  ?  ?  ? ?

## 2022-01-11 NOTE — Progress Notes (Signed)
Virtual Visit via Video Note ? ?I connected with Elizabeth Malone on 01/13/22 at  4:00 PM EDT by a video enabled telemedicine application and verified that I am speaking with the correct person using two identifiers. ? ?Location: ?Patient: home ?Provider: office ?Persons participated in the visit- patient, provider  ?  ?I discussed the limitations of evaluation and management by telemedicine and the availability of in person appointments. The patient expressed understanding and agreed to proceed. ?  ?I discussed the assessment and treatment plan with the patient. The patient was provided an opportunity to ask questions and all were answered. The patient agreed with the plan and demonstrated an understanding of the instructions. ?  ?The patient was advised to call back or seek an in-person evaluation if the symptoms worsen or if the condition fails to improve as anticipated. ? ?I provided 15 minutes of non-face-to-face time during this encounter. ? ? ?Elizabeth Clay, MD ? ? ? ?[place] MD/PA/NP OP Progress Note ? ?01/13/2022 4:36 PM ?Elizabeth Malone  ?MRN:  [id number] ? ?Chief Complaint:  ?Chief Complaint  ?Patient presents with  ? Follow-up  ? Depression  ? ?HPI:  ?This is a follow-up appointment for bipolar disorder and anxiety.  ?She states that she has been feeling stressed.  Her brother is in the hospital due to transverse myelitis.  She has hypertension, and is in the process of seeing a new PCP.  She is seeing a dentist for some procedure.  She attends [employer].  She is attending meetings in relation to church disaffiliations.  She had "meltdown," having crying spells with anxiety in the context of financial concern after receiving medical bills.   She sleeps at least several hours.  She denies change in appetite.  She enjoys waterobic.  She denies SI.  She denies decreased need for sleep or euphonia. Although she was planning to ask to be off from venlafaxine, she now wants to stay on the current  medication regimen due to this episode.  ? ?Daily routine: sit on the porch with her husband ?Exercise: ?Employment: retired in 2008, used to work as Administrator ?Support: ?Household: husband ?Marital status: married for 50 years in 2022 ?Number of children: 2 (1 son and 1 daughter in [place]) ?She grew up in [place].  She reports lack of nurturing as a child.  Her father died from MI when young. Her mother was driving with her girlfriend, being around with married man while Vance was sitting in the back of the car. ?Had good relationship with her grandmother ? ?Visit Diagnosis:  ?  ICD-10-CM   ?1. Bipolar II disorder ([place])  F31.81   ?  ?2. Anxiety  F41.9   ?  ? ? ?Past Psychiatric History: Please see initial evaluation for full details. I have reviewed the history. No updates at this time.  ?  ? ?Past Medical History:  ?Past Medical History:  ?Diagnosis Date  ? Anxiety   ? Bipolar disorder ([place])   ? CKD (chronic kidney disease) stage 3, GFR 30-59 ml/min (HCC)   ? Complication of anesthesia   ? Depression   ? GERD (gastroesophageal reflux disease)   ? occasionally has to use tums  ? Headache   ? History of diverticulosis   ? Hyperlipidemia   ? Macular degeneration   ? Overactive bladder   ? PONV (postoperative nausea and vomiting)   ?  ?Past Surgical History:  ?Procedure Laterality Date  ? COLONOSCOPY WITH PROPOFOL N/A  10/07/2017  ? Procedure: COLONOSCOPY WITH PROPOFOL;  Surgeon: [name], MD;  Location: [facility];  Service: Endoscopy;  Laterality: N/A;  ? FOOT SURGERY Bilateral   ? KNEE SURGERY Left   ? POLYPECTOMY  10/07/2017  ? Procedure: POLYPECTOMY;  Surgeon: [name], MD;  Location: [facility];  Service: Endoscopy;;  ? TONSILLECTOMY AND ADENOIDECTOMY    ? ? ?Family Psychiatric History: Please see initial evaluation for full details. I have reviewed the history. No updates at this time.  ?  ? ?Family History:  ?Family History  ?Problem Relation Age of Onset  ?  Alzheimer's disease Mother   ? Bipolar disorder Mother   ? Depression Mother   ? Heart attack Father   ? Diabetes Brother   ? Obesity Brother   ? Depression Daughter   ? Stroke Maternal Grandmother   ? Depression Maternal Grandfather   ? Stroke Paternal Grandmother   ? Heart attack Paternal Grandfather   ? Breast cancer Neg Hx   ? ? ?Social History:  ?Social History  ? ?Socioeconomic History  ? Marital status: Married  ?  Spouse name: Not on file  ? Number of children: 2  ? Years of education: Not on file  ? Highest education level: Bachelor's degree (e.g., BA, AB, BS)  ?Occupational History  ? Occupation: retired   ?Tobacco Use  ? Smoking status: Former  ?  Types: Cigarettes  ?  Start date: 07/09/1967  ?  Quit date: 07/08/1985  ?  Years since quitting: 36.5  ? Smokeless tobacco: Never  ?Vaping Use  ? Vaping Use: Never used  ?Substance and Sexual Activity  ? Alcohol use: Yes  ?  Alcohol/week: 3.0 standard drinks  ?  Types: 3 Shots of liquor per week  ?  Comment: occassional  ? Drug use: No  ? Sexual activity: Not Currently  ?Other Topics Concern  ? Not on file  ?Social History Narrative  ? Not on file  ? ?Social Determinants of Health  ? ?Financial Resource Strain: Low Risk   ? Difficulty of Paying Living Expenses: Not hard at all  ?Food Insecurity: No Food Insecurity  ? Worried About Charity fundraiser in the Last Year: Never true  ? Ran Out of Food in the Last Year: Never true  ?Transportation Needs: No Transportation Needs  ? Lack of Transportation (Medical): No  ? Lack of Transportation (Non-Medical): No  ?Physical Activity: Inactive  ? Days of Exercise per Week: 0 days  ? Minutes of Exercise per Session: 0 min  ?Stress: No Stress Concern Present  ? Feeling of Stress : Not at all  ?Social Connections: Not on file  ? ? ?Allergies:  ?Allergies  ?Allergen Reactions  ? Fluoxetine Other (See Comments)  ? Hydrocodone-Chlorpheniramine Other (See Comments)  ? Paroxetine Hcl Other (See Comments)  ? Penicillin G  Benzathine Other (See Comments)  ? Penicillin V Potassium Other (See Comments)  ? Clindamycin Hcl Rash and Other (See Comments)  ?  Katherina Right' syndrome  ? Lamotrigine Rash  ? ? ?Metabolic Disorder Labs: ?Lab Results  ?Component Value Date  ? HGBA1C 5.3 12/22/2021  ? ?No results found for: PROLACTIN ?Lab Results  ?Component Value Date  ? CHOL 219 (H) 12/22/2021  ? TRIG 232 (H) 12/22/2021  ? HDL 66 12/22/2021  ? CHOLHDL 3.3 12/22/2021  ? VLDL 39 (H) 08/17/2017  ? LDLCALC 113 (H) 12/22/2021  ? LDLCALC 129 (H) 10/24/2021  ? ?Lab Results  ?Component Value Date  ?  TSH 1.100 12/22/2021  ? TSH 1.580 10/24/2021  ? ? ?Therapeutic Level Labs: ?Lab Results  ?Component Value Date  ? LITHIUM 0.66 05/07/2021  ? LITHIUM 0.72 04/22/2020  ? ?No results found for: VALPROATE ?No components found for:  CBMZ ? ?Current Medications: ?Current Outpatient Medications  ?Medication Sig Dispense Refill  ? atorvastatin (LIPITOR) 80 MG tablet Take 1 tablet (80 mg total) by mouth daily. 90 tablet 4  ? benazepril (LOTENSIN) 10 MG tablet Take 1 tablet (10 mg total) by mouth daily. 30 tablet 3  ? Calcium Citrate-Vitamin D (CALCIUM + D PO) Take 1 tablet by mouth 2 (two) times daily.    ? fenofibrate (TRICOR) 145 MG tablet Take 1 tablet (145 mg total) by mouth daily. 30 tablet 5  ? fexofenadine (ALLERGY RELIEF) 180 MG tablet TAKE 1 TABLET(180 MG) BY MOUTH DAILY 90 tablet 4  ? fluticasone (FLONASE) 50 MCG/ACT nasal spray SHAKE LIQUID AND USE 2 SPRAYS IN EACH NOSTRIL DAILY 16 g 7  ? metoprolol succinate (TOPROL-XL) 50 MG 24 hr tablet Take 1 tablet (50 mg total) by mouth daily. 30 tablet 4  ? montelukast (SINGULAIR) 10 MG tablet TAKE 1 TABLET(10 MG) BY MOUTH AT BEDTIME 90 tablet 0  ? Multiple Vitamin (MULTIVITAMIN) tablet Take 1 tablet by mouth daily.    ? Omega-3 Fatty Acids (FISH OIL) 1000 MG CAPS Take 1,000 mg by mouth 2 (two) times daily.    ? omeprazole (PRILOSEC) 40 MG capsule Take 1 capsule (40 mg total) by mouth daily. 90 capsule 4  ?  oxybutynin (DITROPAN-XL) 10 MG 24 hr tablet TAKE 2 TABLETS AT BEDTIME. 180 tablet 4  ? QUEtiapine (SEROQUEL) 100 MG tablet Take 1 tablet (100 mg total) by mouth at bedtime. 90 tablet 1  ? valACYclovir (VALTREX) 1000 MG tablet

## 2022-01-12 ENCOUNTER — Ambulatory Visit: Payer: Self-pay

## 2022-01-12 NOTE — Telephone Encounter (Signed)
Has appt on Thursday 5/4 at 9:20am with PCP.  ?

## 2022-01-12 NOTE — Telephone Encounter (Signed)
?  Chief Complaint: high BP ?Symptoms: mild headache, sharp pain to mid chest,hot and cold flashes, ?Frequency: yesterday ?Pertinent Negatives: Patient denies any radiating pain, stated chest pain is quick and fees like stabbing pain ?Disposition: '[]'$ ED /'[]'$ Urgent Care (no appt availability in office) / '[]'$ Appointment(In office/virtual)/ '[]'$  [facility]/ '[]'$ Home Care/ '[]'$ Refused Recommended Disposition /'[]'$ [facility]/ '[x]'$  Follow-up with PCP ?Additional Notes: NT concerned about CP and TN advised pt chest pain needs to be investigated. Pt stated does not want to go to ED- advised pt that  is a possibility with her BP so high cannot assume its anxiety ? ?Pt is asking for a referral to Dr END (cardiologist) ? ? ? ?Reason for Disposition ? Systolic BP  >= 161 OR Diastolic >= 096 ? ?Answer Assessment - Initial Assessment Questions ?1. BLOOD PRESSURE: "What is the blood pressure?" "Did you take at least two measurements 5 minutes apart?" ?    Last night 179/99 ?2. ONSET: "When did you take your blood pressure?" ?    This am  ?3. HOW: "How did you obtain the blood pressure?" (e.g., visiting nurse, automatic home BP monitor) ?    Automatic BP ?4. HISTORY: "Do you have a history of high blood pressure?" ?    yes ?5. MEDICATIONS: "Are you taking any medications for blood pressure?" "Have you missed any doses recently?" ?    Yes took 2 last night ?6. OTHER SYMPTOMS: "Do you have any symptoms?" (e.g., headache, chest pain, blurred vision, difficulty breathing, weakness) ?    Hot and cold flashes, headache since yesterday, stabbing pain betweenmid sternal pain sharp  ?7. PREGNANCY: "Is there any chance you are pregnant?" "When was your last menstrual period?" ?    *No Answer* ? ?Protocols used: Blood Pressure - High-A-AH ? ?

## 2022-01-13 ENCOUNTER — Telehealth (INDEPENDENT_AMBULATORY_CARE_PROVIDER_SITE_OTHER): Payer: Medicare PPO | Admitting: Psychiatry

## 2022-01-13 ENCOUNTER — Encounter: Payer: Self-pay | Admitting: Psychiatry

## 2022-01-13 ENCOUNTER — Ambulatory Visit: Payer: Medicare PPO | Admitting: Internal Medicine

## 2022-01-13 DIAGNOSIS — H35033 Hypertensive retinopathy, bilateral: Secondary | ICD-10-CM | POA: Diagnosis not present

## 2022-01-13 DIAGNOSIS — H5360 Unspecified night blindness: Secondary | ICD-10-CM | POA: Diagnosis not present

## 2022-01-13 DIAGNOSIS — H04123 Dry eye syndrome of bilateral lacrimal glands: Secondary | ICD-10-CM | POA: Diagnosis not present

## 2022-01-13 DIAGNOSIS — F419 Anxiety disorder, unspecified: Secondary | ICD-10-CM

## 2022-01-13 DIAGNOSIS — H25013 Cortical age-related cataract, bilateral: Secondary | ICD-10-CM | POA: Diagnosis not present

## 2022-01-13 DIAGNOSIS — F3181 Bipolar II disorder: Secondary | ICD-10-CM | POA: Diagnosis not present

## 2022-01-13 DIAGNOSIS — H35363 Drusen (degenerative) of macula, bilateral: Secondary | ICD-10-CM | POA: Diagnosis not present

## 2022-01-13 NOTE — Patient Instructions (Signed)
Continue venlafaxine 225 mg daily ?Continue quetiapine 100 mg at night ?Continue Buspar 15 mg three times a day ?Next appointment- 6/7 at 9:30, video ?

## 2022-01-14 ENCOUNTER — Ambulatory Visit: Payer: Medicare PPO | Admitting: Internal Medicine

## 2022-01-15 ENCOUNTER — Encounter: Payer: Self-pay | Admitting: Internal Medicine

## 2022-01-15 ENCOUNTER — Telehealth: Payer: Self-pay

## 2022-01-15 ENCOUNTER — Ambulatory Visit: Payer: Medicare PPO | Admitting: Internal Medicine

## 2022-01-15 VITALS — BP 120/82 | HR 69 | Temp 98.1°F | Ht 64.57 in | Wt 203.2 lb

## 2022-01-15 DIAGNOSIS — F419 Anxiety disorder, unspecified: Secondary | ICD-10-CM

## 2022-01-15 DIAGNOSIS — I10 Essential (primary) hypertension: Secondary | ICD-10-CM | POA: Diagnosis not present

## 2022-01-15 MED ORDER — HYDROXYZINE PAMOATE 25 MG PO CAPS: 25.0000 mg | ORAL_CAPSULE | Freq: Three times a day (TID) | ORAL | 0 refills | Status: AC | PRN

## 2022-01-15 MED ORDER — BENAZEPRIL HCL 40 MG PO TABS: 40.0000 mg | ORAL_TABLET | Freq: Every day | ORAL | 5 refills | Status: DC

## 2022-01-15 NOTE — Progress Notes (Signed)
? ?BP 120/82   Pulse 69   Temp 98.1 ?F (36.7 ?C) (Oral)   Ht 5' 4.57" (1.64 m)   Wt 203 lb 3.2 oz (92.2 kg)   SpO2 99%   BMI 34.27 kg/m?   ? ?Subjective:  ? ? Patient ID: Elizabeth Malone, female    DOB: 06-Apr-1951, 71 y.o.   MRN: [id number] ? ?Chief Complaint  ?Patient presents with  ?? Hypertension  ?  Has been running high, patient states that she added her husbands BP med to hers and has taken it down some.   ? ? ?HPI: ?Elizabeth Malone is a 71 y.o. female ? ?Hypertension ?This is a chronic problem. The current episode started more than 1 year ago. The problem is uncontrolled. Associated symptoms include anxiety. Pertinent negatives include no blurred vision, chest pain, headaches, malaise/fatigue, neck pain, orthopnea, palpitations, peripheral edema, PND, shortness of breath or sweats.  ?Anxiety ?Presents for follow-up visit. Symptoms include nervous/anxious behavior. Patient reports no chest pain, compulsions, confusion, decreased concentration, dizziness, feeling of choking, irritability, malaise, muscle tension, nausea, palpitations or shortness of breath.  ? ? ? ?Chief Complaint  ?Patient presents with  ?? Hypertension  ?  Has been running high, patient states that she added her husbands BP med to hers and has taken it down some.   ? ? ?Relevant past medical, surgical, family and social history reviewed and updated as indicated. Interim medical history since our last visit reviewed. ?Allergies and medications reviewed and updated. ? ?Review of Systems  ?Constitutional:  Negative for activity change, appetite change, chills, fatigue, fever, irritability and malaise/fatigue.  ?HENT:  Negative for congestion, ear discharge, ear pain and facial swelling.   ?Eyes:  Negative for blurred vision, pain and itching.  ?Respiratory:  Negative for cough, chest tightness, shortness of breath and wheezing.   ?Cardiovascular:  Negative for chest pain, palpitations, orthopnea, leg swelling and PND.  ?Gastrointestinal:   Negative for abdominal distention, abdominal pain, blood in stool, constipation, diarrhea, nausea and vomiting.  ?Endocrine: Negative for cold intolerance, heat intolerance, polydipsia, polyphagia and polyuria.  ?Genitourinary:  Negative for difficulty urinating, dysuria, flank pain, frequency, hematuria and urgency.  ?Musculoskeletal:  Negative for neck pain.  ?Neurological:  Negative for dizziness, tremors, speech difficulty, weakness, light-headedness, numbness and headaches.  ?Psychiatric/Behavioral:  Negative for confusion and decreased concentration. The patient is nervous/anxious.   ? ?Per HPI unless specifically indicated above ? ?   ?Objective:  ?  ?BP 120/82   Pulse 69   Temp 98.1 ?F (36.7 ?C) (Oral)   Ht 5' 4.57" (1.64 m)   Wt 203 lb 3.2 oz (92.2 kg)   SpO2 99%   BMI 34.27 kg/m?   ?Wt Readings from Last 3 Encounters:  ?01/15/22 203 lb 3.2 oz (92.2 kg)  ?12/29/21 204 lb 6.4 oz (92.7 kg)  ?11/04/21 208 lb (94.3 kg)  ?  ?Physical Exam ?Vitals and nursing note reviewed.  ?Constitutional:   ?   General: She is not in acute distress. ?   Appearance: Normal appearance. She is not ill-appearing or diaphoretic.  ?Eyes:  ?   Conjunctiva/sclera: Conjunctivae normal.  ?Pulmonary:  ?   Breath sounds: No rhonchi.  ?Abdominal:  ?   General: Abdomen is flat. Bowel sounds are normal. There is no distension.  ?   Palpations: Abdomen is soft. There is no mass.  ?   Tenderness: There is no abdominal tenderness. There is no guarding.  ?Skin: ?   General: Skin is warm  and dry.  ?   Coloration: Skin is not jaundiced.  ?   Findings: No erythema.  ?Neurological:  ?   Mental Status: She is alert.  ? ? ?Results for orders placed or performed in visit on 12/22/21  ?Lipid panel  ?Result Value Ref Range  ? Cholesterol, Total 219 (H) 100 - 199 mg/dL  ? Triglycerides 232 (H) 0 - 149 mg/dL  ? HDL 66 >39 mg/dL  ? VLDL Cholesterol Cal 40 5 - 40 mg/dL  ? LDL Chol Calc (NIH) 113 (H) 0 - 99 mg/dL  ? Chol/HDL Ratio 3.3 0.0 - 4.4 ratio   ?CBC with Differential/Platelet  ?Result Value Ref Range  ? WBC 6.2 3.4 - 10.8 x10E3/uL  ? RBC 4.75 3.77 - 5.28 x10E6/uL  ? Hemoglobin 14.5 11.1 - 15.9 g/dL  ? Hematocrit 43.8 34.0 - 46.6 %  ? MCV 92 79 - 97 fL  ? MCH 30.5 26.6 - 33.0 pg  ? MCHC 33.1 31.5 - 35.7 g/dL  ? RDW 13.8 11.7 - 15.4 %  ? Platelets 234 150 - 450 x10E3/uL  ? Neutrophils 59 Not Estab. %  ? Lymphs 29 Not Estab. %  ? Monocytes 8 Not Estab. %  ? Eos 2 Not Estab. %  ? Basos 1 Not Estab. %  ? Neutrophils Absolute 3.7 1.4 - 7.0 x10E3/uL  ? Lymphocytes Absolute 1.8 0.7 - 3.1 x10E3/uL  ? Monocytes Absolute 0.5 0.1 - 0.9 x10E3/uL  ? EOS (ABSOLUTE) 0.1 0.0 - 0.4 x10E3/uL  ? Basophils Absolute 0.0 0.0 - 0.2 x10E3/uL  ? Immature Granulocytes 1 Not Estab. %  ? Immature Grans (Abs) 0.0 0.0 - 0.1 x10E3/uL  ?Thyroid Panel With TSH  ?Result Value Ref Range  ? TSH 1.100 0.450 - 4.500 uIU/mL  ? T4, Total 7.1 4.5 - 12.0 ug/dL  ? T3 Uptake Ratio 26 24 - 39 %  ? Free Thyroxine Index 1.8 1.2 - 4.9  ?Bayer DCA Hb A1c Waived  ?Result Value Ref Range  ? HB A1C (BAYER DCA - WAIVED) 5.3 4.8 - 5.6 %  ?Comprehensive metabolic panel  ?Result Value Ref Range  ? Glucose 103 (H) 70 - 99 mg/dL  ? BUN 10 8 - 27 mg/dL  ? Creatinine, Ser 0.82 0.57 - 1.00 mg/dL  ? eGFR 77 >59 mL/min/1.73  ? BUN/Creatinine Ratio 12 12 - 28  ? Sodium 142 134 - 144 mmol/L  ? Potassium 4.2 3.5 - 5.2 mmol/L  ? Chloride 104 96 - 106 mmol/L  ? CO2 27 20 - 29 mmol/L  ? Calcium 10.0 8.7 - 10.3 mg/dL  ? Total Protein 6.9 6.0 - 8.5 g/dL  ? Albumin 4.6 3.8 - 4.8 g/dL  ? Globulin, Total 2.3 1.5 - 4.5 g/dL  ? Albumin/Globulin Ratio 2.0 1.2 - 2.2  ? Bilirubin Total 0.5 0.0 - 1.2 mg/dL  ? Alkaline Phosphatase 140 (H) 44 - 121 IU/L  ? AST 18 0 - 40 IU/L  ? ALT 28 0 - 32 IU/L  ? ?   ? ? ?Current Outpatient Medications:  ??  atorvastatin (LIPITOR) 80 MG tablet, Take 1 tablet (80 mg total) by mouth daily., Disp: 90 tablet, Rfl: 4 ??  Calcium Citrate-Vitamin D (CALCIUM + D PO), Take 1 tablet by mouth 2 (two) times  daily., Disp: , Rfl:  ??  fenofibrate (TRICOR) 145 MG tablet, Take 1 tablet (145 mg total) by mouth daily., Disp: 30 tablet, Rfl: 5 ??  fluticasone (FLONASE) 50 MCG/ACT nasal spray, SHAKE LIQUID  AND USE 2 SPRAYS IN EACH NOSTRIL DAILY, Disp: 16 g, Rfl: 7 ??  hydrOXYzine (VISTARIL) 25 MG capsule, Take 1 capsule (25 mg total) by mouth every 8 (eight) hours as needed for up to 5 days., Disp: 15 capsule, Rfl: 0 ??  metoprolol succinate (TOPROL-XL) 50 MG 24 hr tablet, Take 1 tablet (50 mg total) by mouth daily., Disp: 30 tablet, Rfl: 4 ??  montelukast (SINGULAIR) 10 MG tablet, TAKE 1 TABLET(10 MG) BY MOUTH AT BEDTIME, Disp: 90 tablet, Rfl: 0 ??  Multiple Vitamin (MULTIVITAMIN) tablet, Take 1 tablet by mouth daily., Disp: , Rfl:  ??  Omega-3 Fatty Acids (FISH OIL) 1000 MG CAPS, Take 1,000 mg by mouth 2 (two) times daily., Disp: , Rfl:  ??  omeprazole (PRILOSEC) 40 MG capsule, Take 1 capsule (40 mg total) by mouth daily., Disp: 90 capsule, Rfl: 4 ??  oxybutynin (DITROPAN-XL) 10 MG 24 hr tablet, TAKE 2 TABLETS AT BEDTIME., Disp: 180 tablet, Rfl: 4 ??  QUEtiapine (SEROQUEL) 100 MG tablet, Take 1 tablet (100 mg total) by mouth at bedtime., Disp: 90 tablet, Rfl: 1 ??  benazepril (LOTENSIN) 40 MG tablet, Take 1 tablet (40 mg total) by mouth daily., Disp: 30 tablet, Rfl: 5 ??  venlafaxine XR (EFFEXOR XR) 75 MG 24 hr capsule, Take 3 capsules (225 mg total) by mouth daily., Disp: 270 capsule, Rfl: 1  ? ? ?Assessment & Plan:  ?Htn : is on toprol xl and benazpril  ?Is on benazapril 30 mg now will increase to 40 mg daily. Pt has been anxious sec to insurance bills. ?Continue current meds.  Medication compliance emphasised. pt advised to keep Bp logs. Pt verbalised understanding of the same. Pt to have a low salt diet . Exercise to reach a goal of at least 150 mins a week.  lifestyle modifications explained and pt understands importance of the above. ?Under good control on current regimen. Continue current regimen. Continue to monitor.  Call with any concerns. Refills given. Labs drawn today. ? ? ?2. Anxiety needs to d/w psych for such. ?Is on effexor and seroquel.  ? ? ?Problem List Items Addressed This Visit   ? ?  ? Cardiovascular and Mediastinum  ? Es

## 2022-01-15 NOTE — Telephone Encounter (Signed)
She states that she was crying due to anxiety. She was also concerned about her brother, who was admitted to the hospital.  She was advised by her PCP to be prescribed something for anxiety.  According to the chart review, she was prescribed hydroxyzine 25 mg as needed for anxiety.  She denies SI.  She agrees with the following plan.  ?- increase quetiapine 150 mg at night (she will contact the office if she needs a refill) ?- try hydroxyzine 25 mg daily prn for anxiety

## 2022-01-15 NOTE — Telephone Encounter (Signed)
pt states she just seen her primary today and she was advised to call you and ask for something for her anxiety.  Pt was crying on the phone when she left her message.  ?

## 2022-01-19 ENCOUNTER — Ambulatory Visit: Payer: Medicare PPO | Admitting: Physician Assistant

## 2022-01-21 DIAGNOSIS — H1131 Conjunctival hemorrhage, right eye: Secondary | ICD-10-CM | POA: Diagnosis not present

## 2022-01-23 ENCOUNTER — Ambulatory Visit: Payer: Medicare PPO | Admitting: Internal Medicine

## 2022-01-24 ENCOUNTER — Other Ambulatory Visit: Payer: Self-pay | Admitting: Psychiatry

## 2022-02-06 ENCOUNTER — Other Ambulatory Visit: Payer: Self-pay | Admitting: Internal Medicine

## 2022-02-10 NOTE — Telephone Encounter (Signed)
Rx 01/15/22 #30 5RF- too soon Requested Prescriptions  Pending Prescriptions Disp Refills  . benazepril (LOTENSIN) 10 MG tablet [Pharmacy Med Name: BENAZEPRIL '10MG'$  TABLETS] 30 tablet 3    Sig: TAKE 1 TABLET(10 MG) BY MOUTH DAILY     Cardiovascular:  ACE Inhibitors Passed - 02/06/2022  5:33 PM      Passed - Cr in normal range and within 180 days    Creatinine, Ser  Date Value Ref Range Status  12/22/2021 0.82 0.57 - 1.00 mg/dL Final         Passed - K in normal range and within 180 days    Potassium  Date Value Ref Range Status  12/22/2021 4.2 3.5 - 5.2 mmol/L Final         Passed - Patient is not pregnant      Passed - Last BP in normal range    BP Readings from Last 1 Encounters:  01/15/22 120/82         Passed - Valid encounter within last 6 months    Recent Outpatient Visits          3 weeks ago Essential hypertension   [facility] Vigg, Avanti, MD   1 month ago [place] Vigg, Avanti, MD   3 months ago Chronic pain of left knee   [facility] Vigg, Avanti, MD   3 months ago [place] Vigg, Avanti, MD   4 months ago Essential hypertension   [place], [place]      Future Appointments            In 1 week Vigg, Avanti, MD [facility]   In 1 month  [facility], [facility]

## 2022-02-17 ENCOUNTER — Ambulatory Visit: Payer: Medicare PPO | Admitting: Internal Medicine

## 2022-02-19 ENCOUNTER — Encounter: Payer: Self-pay | Admitting: Internal Medicine

## 2022-02-19 ENCOUNTER — Telehealth (INDEPENDENT_AMBULATORY_CARE_PROVIDER_SITE_OTHER): Payer: Medicare PPO | Admitting: Internal Medicine

## 2022-02-19 VITALS — BP 103/75

## 2022-02-19 DIAGNOSIS — I10 Essential (primary) hypertension: Secondary | ICD-10-CM

## 2022-02-19 DIAGNOSIS — F419 Anxiety disorder, unspecified: Secondary | ICD-10-CM

## 2022-02-19 MED ORDER — FEXOFENADINE HCL 180 MG PO TABS: 180.0000 mg | ORAL_TABLET | Freq: Every day | ORAL | 1 refills | Status: DC

## 2022-02-19 NOTE — Progress Notes (Signed)
BP 103/75    Subjective:    Patient ID: Elizabeth Malone, female    DOB: 09-27-1950, 71 y.o.   MRN: [id number]  Chief Complaint  Patient presents with  . Hypertension  . Anxiety    HPI: Elizabeth Malone is a 71 y.o. female   This visit was completed via telephone due to the restrictions of the COVID-19 pandemic. All issues as above were discussed and addressed but no physical exam was performed. If it was felt that the patient should be evaluated in the office, they were directed there. The patient verbally consented to this visit. Patient was unable to complete an audio/visual visit due to Technical difficulties. Due to the catastrophic nature of the COVID-19 pandemic, this visit was done through audio contact only. Location of the patient: home Location of the provider: home Those involved with this call:  Provider: Charlynne Cousins, MD CMA: Yvonna Alanis, CMA Front Desk/Registration: [employer]  Time spent on call: 10 minutes on the phone discussing health concerns. 10 minutes total spent in review of patient's record and preparation of their chart.    Hypertension This is a chronic (bp at home better) problem. The current episode started more than 1 year ago. The problem has been gradually improving since onset. Associated symptoms include anxiety. Pertinent negatives include no chest pain, palpitations or shortness of breath.  Anxiety Presents for follow-up visit. Symptoms include nervous/anxious behavior. Patient reports no chest pain, compulsions, confusion, decreased concentration, depressed mood, dizziness, dry mouth, excessive worry, feeling of choking, hyperventilation, impotence, insomnia, irritability, malaise, muscle tension, nausea, obsessions, palpitations, panic, restlessness, shortness of breath or suicidal ideas.     Chief Complaint  Patient presents with  . Hypertension  . Anxiety    Relevant past medical, surgical, family and social history reviewed and  updated as indicated. Interim medical history since our last visit reviewed. Allergies and medications reviewed and updated.  Review of Systems  Constitutional:  Negative for irritability.  Respiratory:  Negative for shortness of breath.   Cardiovascular:  Negative for chest pain and palpitations.  Gastrointestinal:  Negative for nausea.  Genitourinary:  Negative for impotence.  Neurological:  Negative for dizziness.  Psychiatric/Behavioral:  Negative for confusion, decreased concentration and suicidal ideas. The patient is nervous/anxious. The patient does not have insomnia.     Per HPI unless specifically indicated above     Objective:    BP 103/75   Wt Readings from Last 3 Encounters:  01/15/22 203 lb 3.2 oz (92.2 kg)  12/29/21 204 lb 6.4 oz (92.7 kg)  11/04/21 208 lb (94.3 kg)    Physical Exam  Results for orders placed or performed in visit on 12/22/21  Lipid panel  Result Value Ref Range   Cholesterol, Total 219 (H) 100 - 199 mg/dL   Triglycerides 232 (H) 0 - 149 mg/dL   HDL 66 >39 mg/dL   VLDL Cholesterol Cal 40 5 - 40 mg/dL   LDL Chol Calc (NIH) 113 (H) 0 - 99 mg/dL   Chol/HDL Ratio 3.3 0.0 - 4.4 ratio  CBC with Differential/Platelet  Result Value Ref Range   WBC 6.2 3.4 - 10.8 x10E3/uL   RBC 4.75 3.77 - 5.28 x10E6/uL   Hemoglobin 14.5 11.1 - 15.9 g/dL   Hematocrit 43.8 34.0 - 46.6 %   MCV 92 79 - 97 fL   MCH 30.5 26.6 - 33.0 pg   MCHC 33.1 31.5 - 35.7 g/dL   RDW 13.8 11.7 - 15.4 %  Platelets 234 150 - 450 x10E3/uL   Neutrophils 59 Not Estab. %   Lymphs 29 Not Estab. %   Monocytes 8 Not Estab. %   Eos 2 Not Estab. %   Basos 1 Not Estab. %   Neutrophils Absolute 3.7 1.4 - 7.0 x10E3/uL   Lymphocytes Absolute 1.8 0.7 - 3.1 x10E3/uL   Monocytes Absolute 0.5 0.1 - 0.9 x10E3/uL   EOS (ABSOLUTE) 0.1 0.0 - 0.4 x10E3/uL   Basophils Absolute 0.0 0.0 - 0.2 x10E3/uL   Immature Granulocytes 1 Not Estab. %   Immature Grans (Abs) 0.0 0.0 - 0.1 x10E3/uL  Thyroid  Panel With TSH  Result Value Ref Range   TSH 1.100 0.450 - 4.500 uIU/mL   T4, Total 7.1 4.5 - 12.0 ug/dL   T3 Uptake Ratio 26 24 - 39 %   Free Thyroxine Index 1.8 1.2 - 4.9  Bayer DCA Hb A1c Waived  Result Value Ref Range   HB A1C (BAYER DCA - WAIVED) 5.3 4.8 - 5.6 %  Comprehensive metabolic panel  Result Value Ref Range   Glucose 103 (H) 70 - 99 mg/dL   BUN 10 8 - 27 mg/dL   Creatinine, Ser 0.82 0.57 - 1.00 mg/dL   eGFR 77 >59 mL/min/1.73   BUN/Creatinine Ratio 12 12 - 28   Sodium 142 134 - 144 mmol/L   Potassium 4.2 3.5 - 5.2 mmol/L   Chloride 104 96 - 106 mmol/L   CO2 27 20 - 29 mmol/L   Calcium 10.0 8.7 - 10.3 mg/dL   Total Protein 6.9 6.0 - 8.5 g/dL   Albumin 4.6 3.8 - 4.8 g/dL   Globulin, Total 2.3 1.5 - 4.5 g/dL   Albumin/Globulin Ratio 2.0 1.2 - 2.2   Bilirubin Total 0.5 0.0 - 1.2 mg/dL   Alkaline Phosphatase 140 (H) 44 - 121 IU/L   AST 18 0 - 40 IU/L   ALT 28 0 - 32 IU/L        Current Outpatient Medications:  .  atorvastatin (LIPITOR) 80 MG tablet, Take 1 tablet (80 mg total) by mouth daily., Disp: 90 tablet, Rfl: 4 .  benazepril (LOTENSIN) 40 MG tablet, Take 1 tablet (40 mg total) by mouth daily., Disp: 30 tablet, Rfl: 5 .  Calcium Citrate-Vitamin D (CALCIUM + D PO), Take 1 tablet by mouth 2 (two) times daily., Disp: , Rfl:  .  fenofibrate (TRICOR) 145 MG tablet, Take 1 tablet (145 mg total) by mouth daily., Disp: 30 tablet, Rfl: 5 .  fluticasone (FLONASE) 50 MCG/ACT nasal spray, SHAKE LIQUID AND USE 2 SPRAYS IN EACH NOSTRIL DAILY, Disp: 16 g, Rfl: 7 .  metoprolol succinate (TOPROL-XL) 50 MG 24 hr tablet, Take 1 tablet (50 mg total) by mouth daily., Disp: 30 tablet, Rfl: 4 .  montelukast (SINGULAIR) 10 MG tablet, TAKE 1 TABLET(10 MG) BY MOUTH AT BEDTIME, Disp: 90 tablet, Rfl: 0 .  Multiple Vitamin (MULTIVITAMIN) tablet, Take 1 tablet by mouth daily., Disp: , Rfl:  .  Omega-3 Fatty Acids (FISH OIL) 1000 MG CAPS, Take 1,000 mg by mouth 2 (two) times daily., Disp: ,  Rfl:  .  omeprazole (PRILOSEC) 40 MG capsule, Take 1 capsule (40 mg total) by mouth daily., Disp: 90 capsule, Rfl: 4 .  oxybutynin (DITROPAN-XL) 10 MG 24 hr tablet, TAKE 2 TABLETS AT BEDTIME., Disp: 180 tablet, Rfl: 4 .  QUEtiapine 150 MG TABS, Take 150 mg by mouth at bedtime., Disp: 90 tablet, Rfl: 0 .  fexofenadine (ALLEGRA) 180 MG  tablet, Take 1 tablet (180 mg total) by mouth daily., Disp: 90 tablet, Rfl: 1 .  venlafaxine XR (EFFEXOR XR) 75 MG 24 hr capsule, Take 3 capsules (225 mg total) by mouth daily., Disp: 270 capsule, Rfl: 1    Assessment & Plan:  HTN :  Continue current meds.  Medication compliance emphasised. pt advised to keep Bp logs. Pt verbalised understanding of the same. Pt to have a low salt diet . Exercise to reach a goal of at least 150 mins a week.  lifestyle modifications explained and pt understands importance of the above. Under good control on current regimen. Continue current regimen. Continue to monitor. Call with any concerns. Refills given. Labs drawn today.   Anxiety / depression : is on Effexor and quetiapine for such to fu with psychiatry.  potential Side effects dw pt. to call office if develops any SE. will fu with me in 1 month for such. pt verbalised understanding.      Problem List Items Addressed This Visit       Cardiovascular and Mediastinum   Essential hypertension - Primary     Other   Anxiety     No orders of the defined types were placed in this encounter.    Meds ordered this encounter  Medications  . fexofenadine (ALLEGRA) 180 MG tablet    Sig: Take 1 tablet (180 mg total) by mouth daily.    Dispense:  90 tablet    Refill:  1     Follow up plan: No follow-ups on file.
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
## 2022-02-25 ENCOUNTER — Telehealth (INDEPENDENT_AMBULATORY_CARE_PROVIDER_SITE_OTHER): Payer: Medicare PPO | Admitting: Psychiatry

## 2022-02-25 ENCOUNTER — Encounter: Payer: Self-pay | Admitting: Psychiatry

## 2022-02-25 DIAGNOSIS — F3181 Bipolar II disorder: Secondary | ICD-10-CM | POA: Diagnosis not present

## 2022-02-25 DIAGNOSIS — F419 Anxiety disorder, unspecified: Secondary | ICD-10-CM

## 2022-02-25 MED ORDER — QUETIAPINE FUMARATE 200 MG PO TABS: 200.0000 mg | ORAL_TABLET | Freq: Every day | ORAL | 0 refills | Status: DC

## 2022-02-25 NOTE — Patient Instructions (Signed)
Continue venlafaxine 225 mg daily Increase quetiapine 200 mg at night Continue Buspar 15 mg three times a day Next appointment- 7/12 at 1 PM, video

## 2022-03-05 ENCOUNTER — Other Ambulatory Visit: Payer: Self-pay | Admitting: Nurse Practitioner

## 2022-03-05 DIAGNOSIS — I10 Essential (primary) hypertension: Secondary | ICD-10-CM

## 2022-03-09 ENCOUNTER — Other Ambulatory Visit: Payer: Self-pay | Admitting: Internal Medicine

## 2022-03-09 DIAGNOSIS — I10 Essential (primary) hypertension: Secondary | ICD-10-CM

## 2022-03-10 NOTE — Telephone Encounter (Signed)
Go through to [facility] [id number].   The metoprolol 50 mg 24 hr. Does not have any refills.   Needs new rx.    Benapril 40 mg tablet last filled 01/16/2022.  Will return to practice for new rx to be written.

## 2022-03-10 NOTE — Telephone Encounter (Signed)
Only Metoprolol is due.

## 2022-03-11 MED ORDER — METOPROLOL SUCCINATE ER 50 MG PO TB24: 50.0000 mg | ORAL_TABLET | Freq: Every day | ORAL | 1 refills | Status: DC

## 2022-03-11 MED ORDER — BENAZEPRIL HCL 40 MG PO TABS: 40.0000 mg | ORAL_TABLET | Freq: Every day | ORAL | 1 refills | Status: DC

## 2022-03-11 NOTE — Addendum Note (Signed)
Addended by: Donzetta Kohut A on: 03/11/2022 09:11 AM   Modules accepted: Orders

## 2022-03-16 ENCOUNTER — Telehealth: Payer: Self-pay | Admitting: Internal Medicine

## 2022-03-16 ENCOUNTER — Telehealth: Payer: Self-pay

## 2022-03-16 ENCOUNTER — Other Ambulatory Visit: Payer: Self-pay | Admitting: Internal Medicine

## 2022-03-16 ENCOUNTER — Other Ambulatory Visit: Payer: Self-pay | Admitting: Psychiatry

## 2022-03-16 DIAGNOSIS — E7800 Pure hypercholesterolemia, unspecified: Secondary | ICD-10-CM

## 2022-03-16 MED ORDER — QUETIAPINE FUMARATE ER 200 MG PO TB24
200.0000 mg | ORAL_TABLET | Freq: Every day | ORAL | 0 refills | Status: DC
Start: 2022-03-16 — End: 2022-07-07

## 2022-03-16 MED ORDER — VENLAFAXINE HCL ER 75 MG PO CP24: 225.0000 mg | ORAL_CAPSULE | Freq: Every day | ORAL | 0 refills | Status: DC

## 2022-03-16 NOTE — Telephone Encounter (Signed)
Medication Refill - Medication: benazepril (LOTENSIN) 40 MG tablet [[id number]]   Has the patient contacted their pharmacy? No. (Agent: If no, request that the patient contact the pharmacy for the refill. If patient does not wish to contact the pharmacy document the reason why and proceed with request.) (Agent: If yes, when and what did the pharmacy advise?)  Preferred Pharmacy (with phone number or street name): [facility] [address] [place] [address] [phone] Has the patient been seen for an appointment in the last year OR does the patient have an upcoming appointment? Yes.    Agent: Please be advised that RX refills may take up to 3 business days. We ask that you follow-up with your pharmacy.

## 2022-03-16 NOTE — Telephone Encounter (Signed)
Understood, and medication are ordered. Please also remind her that she will need to be in [place] for upcoming video visit due to restrictions in telehealth.

## 2022-03-16 NOTE — Telephone Encounter (Signed)
Medication refill request - Telephone call with pt, after she left a message she had to move out of her home with her granddaughter to her daughter's home and then going to her son's home for a week due to problems with spouse.  Patient reported she only had 4 days left of both Quetiapine and Venlafaxine XR and is requesting a weeks worth of both be sent into the [facility] in [place], [place] on [address].  Reports she does not want to return home yet to get her other medication and will be going to stay with her son for a week on this Friday 7//7/23.  Wants to have the weeks worth to take with her and understands not sure if insurance will cover but may have to pay for generic out-of-pocket.  Agreed to send request to Dr. Modesta Messing this date.

## 2022-03-16 NOTE — Addendum Note (Signed)
Addended by: Valli Glance F on: 03/16/2022 01:39 PM   Modules accepted: Orders

## 2022-03-16 NOTE — Telephone Encounter (Signed)
Medication management - Called pt back to inform Dr. Modesta Messing sent in a 1 week supply of her Seroquel and Venlafaxine XR to the [facility] in [place], [place]. Informed pt if she stayed longer in [facility] she would need to be back in [facility] for her appt on 04/10/22 as Dr. Ivor Reining licensure is not for [facility].  Patient stated understanding and to call back if any issues.

## 2022-03-16 NOTE — Telephone Encounter (Signed)
Pt's & pharmacy is calling for assistance. Pt is currently in [place]. Pt is dealing with a situation that caused her to have to leave home abruptly so she left some of her medications. Pt is very upset and has been reaching out to all of her providers trying to obtain all of her meds. Pt says that she can only recall 3 medications that she need that was prescribed by Dr. Neomia Dear. Pt is aware that provider is no longer in office but would like to know if possible could office assist with her obtaining her Rx's?  oxybutynin (DITROPAN-XL) 10 MG 24 hr tablet  atorvastatin (LIPITOR) 80 MG tablet  benazepril (LOTENSIN) 40 MG tablet    Pharmacy: [facility], [place] - [address] AT [address] [place], [place] [address]  Phone:  [phone]  Fax:  [phone]

## 2022-03-18 ENCOUNTER — Other Ambulatory Visit: Payer: Self-pay | Admitting: Nurse Practitioner

## 2022-03-18 ENCOUNTER — Telehealth: Payer: Self-pay | Admitting: Internal Medicine

## 2022-03-18 MED ORDER — BENAZEPRIL HCL 40 MG PO TABS
40.0000 mg | ORAL_TABLET | Freq: Every day | ORAL | 0 refills | Status: DC
Start: 2022-03-18 — End: 2022-07-07

## 2022-03-18 MED ORDER — ATORVASTATIN CALCIUM 80 MG PO TABS: 80.0000 mg | ORAL_TABLET | Freq: Every day | ORAL | 0 refills | Status: DC

## 2022-03-18 MED ORDER — OXYBUTYNIN CHLORIDE ER 10 MG PO TB24: 20.0000 mg | ORAL_TABLET | Freq: Every day | ORAL | 0 refills | Status: DC

## 2022-03-18 NOTE — Telephone Encounter (Signed)
Refilled today by provider.

## 2022-03-18 NOTE — Telephone Encounter (Signed)
Called patient to to inform her that a 30 day supply of medications she requested have been sent to the [facility] in [place], [place]   no answer, left a voicemail for patient to return my call.    [place] for nurse triage to give results if patient calls back.

## 2022-03-18 NOTE — Telephone Encounter (Signed)
Copied from [facility] [id number]. Topic: General - Other >> Mar 18, 2022 11:03 AM Everette C wrote: Reason for CRM: Medication Refill - Medication: atorvastatin (LIPITOR) 80 MG tablet [[id number]]   Has the patient contacted their pharmacy? Yes.  The patient has directed to contact their PCP to request modified quantity. The patient is unable to return home for their medication and requesting a 30 day supply  (Agent: If no, request that the patient contact the pharmacy for the refill. If patient does not wish to contact the pharmacy document the reason why and proceed with request.) (Agent: If yes, when and what did the pharmacy advise?)  Preferred Pharmacy (with phone number or street name): [place], [address] [place] [address] Phone: [phone] Fax: [phone] Hours: Not open 24 hours  Has the patient been seen for an appointment in the last year OR does the patient have an upcoming appointment? Yes.    Agent: Please be advised that RX refills may take up to 3 business days. We ask that you follow-up with your pharmacy.

## 2022-03-19 NOTE — Telephone Encounter (Signed)
Requested medication (s) are due for refill today:no  Requested medication (s) are on the active medication list: yes  Last refill:  03/18/22  Future visit scheduled: yes  Notes to clinic:  Unable to refill per protocol, last refill by provider 03/18/22. Possible duplicate     Requested Prescriptions  Pending Prescriptions Disp Refills   oxybutynin (DITROPAN-XL) 10 MG 24 hr tablet [Pharmacy Med Name: OXYBUTYNIN ER '10MG'$  TABLETS] 90 tablet     Sig: TAKE 1 TABLET(10 MG) BY MOUTH AT BEDTIME. FOLLOW-UP FOR FURTHER REFILLS     Urology:  Bladder Agents Passed - 03/18/2022 10:51 AM      Passed - Valid encounter within last 12 months    Recent Outpatient Visits           4 weeks ago Essential hypertension   [place] Vigg, Avanti, MD   2 months ago Essential hypertension   [place] Vigg, Avanti, MD   2 months ago Hypercholesteremia   [facility] Vigg, Avanti, MD   4 months ago Chronic pain of left knee   [facility] Vigg, Avanti, MD   4 months ago [place], MD       Future Appointments             In 3 weeks [place], [place]

## 2022-03-22 NOTE — Progress Notes (Deleted)
BH MD/PA/NP OP Progress Note  03/22/2022 10:16 AM Elizabeth Malone  MRN:  [id number]  Chief Complaint: No chief complaint on file.  HPI: *** Left the house Visit Diagnosis: No diagnosis found.  Past Psychiatric History: Please see initial evaluation for full details. I have reviewed the history. No updates at this time.    Past Medical History:  Past Medical History:  Diagnosis Date   Anxiety    Bipolar disorder ([place])    CKD (chronic kidney disease) stage 3, GFR 30-59 ml/min (HCC)    Complication of anesthesia    Depression    GERD (gastroesophageal reflux disease)    occasionally has to use tums   Headache    History of diverticulosis    Hyperlipidemia    Macular degeneration    Overactive bladder    PONV (postoperative nausea and vomiting)     Past Surgical History:  Procedure Laterality Date   COLONOSCOPY WITH PROPOFOL N/A 10/07/2017   Procedure: COLONOSCOPY WITH PROPOFOL;  Surgeon: [name], MD;  Location: [facility];  Service: Endoscopy;  Laterality: N/A;   FOOT SURGERY Bilateral    KNEE SURGERY Left    POLYPECTOMY  10/07/2017   Procedure: POLYPECTOMY;  Surgeon: [name], MD;  Location: [facility];  Service: Endoscopy;;   TONSILLECTOMY AND ADENOIDECTOMY      Family Psychiatric History: Please see initial evaluation for full details. I have reviewed the history. No updates at this time.     Family History:  Family History  Problem Relation Age of Onset   Alzheimer's disease Mother    Bipolar disorder Mother    Depression Mother    Heart attack Father    Diabetes Brother    Obesity Brother    Depression Daughter    Stroke Maternal Grandmother    Depression Maternal Grandfather    Stroke Paternal Grandmother    Heart attack Paternal Grandfather    Breast cancer Neg Hx     Social History:  Social History   Socioeconomic History   Marital status: Married    Spouse name: Not on file   Number of children: 2   Years of education:  Not on file   Highest education level: Bachelor's degree (e.g., BA, AB, BS)  Occupational History   Occupation: retired   Tobacco Use   Smoking status: Former    Types: Cigarettes    Start date: 07/09/1967    Quit date: 07/08/1985    Years since quitting: 36.7   Smokeless tobacco: Never  Vaping Use   Vaping Use: Never used  Substance and Sexual Activity   Alcohol use: Yes    Alcohol/week: 3.0 standard drinks of alcohol    Types: 3 Shots of liquor per week    Comment: occassional   Drug use: No   Sexual activity: Not Currently  Other Topics Concern   Not on file  Social History Narrative   Not on file   Social Determinants of Health   Financial Resource Strain: Low Risk  (04/07/2021)   Overall Financial Resource Strain (CARDIA)    Difficulty of Paying Living Expenses: Not hard at all  Food Insecurity: No Food Insecurity (04/07/2021)   Hunger Vital Sign    Worried About Running Out of Food in the Last Year: Never true    Ran Out of Food in the Last Year: Never true  Transportation Needs: No Transportation Needs (04/07/2021)   PRAPARE - Hydrologist (Medical): No  Lack of Transportation (Non-Medical): No  Physical Activity: Inactive (04/07/2021)   Exercise Vital Sign    Days of Exercise per Week: 0 days    Minutes of Exercise per Session: 0 min  Stress: No Stress Concern Present (04/07/2021)   [place]    Feeling of Stress : Not at all  Social Connections: Moderately Integrated (07/19/2017)   Social Connection and Isolation Panel [NHANES]    Frequency of Communication with Friends and Family: Once a week    Frequency of Social Gatherings with Friends and Family: Once a week    Attends Religious Services: More than 4 times per year    Active Member of [employer] or Organizations: Yes    Attends Music therapist: More than 4 times per year    Marital Status: Married     Allergies:  Allergies  Allergen Reactions   Fluoxetine Other (See Comments)   Hydrocodone-Chlorpheniramine Other (See Comments)   Paroxetine Hcl Other (See Comments)   Penicillin G Benzathine Other (See Comments)   Penicillin V Potassium Other (See Comments)   Clindamycin Hcl Rash and Other (See Comments)    Katherina Right' syndrome   Lamotrigine Rash    Metabolic Disorder Labs: Lab Results  Component Value Date   HGBA1C 5.3 12/22/2021   No results found for: "PROLACTIN" Lab Results  Component Value Date   CHOL 219 (H) 12/22/2021   TRIG 232 (H) 12/22/2021   HDL 66 12/22/2021   CHOLHDL 3.3 12/22/2021   VLDL 39 (H) 08/17/2017   LDLCALC 113 (H) 12/22/2021   LDLCALC 129 (H) 10/24/2021   Lab Results  Component Value Date   TSH 1.100 12/22/2021   TSH 1.580 10/24/2021    Therapeutic Level Labs: Lab Results  Component Value Date   LITHIUM 0.66 05/07/2021   LITHIUM 0.72 04/22/2020   No results found for: "VALPROATE" No results found for: "CBMZ"  Current Medications: Current Outpatient Medications  Medication Sig Dispense Refill   atorvastatin (LIPITOR) 80 MG tablet Take 1 tablet (80 mg total) by mouth daily. 30 tablet 0   benazepril (LOTENSIN) 40 MG tablet Take 1 tablet (40 mg total) by mouth daily. 30 tablet 0   Calcium Citrate-Vitamin D (CALCIUM + D PO) Take 1 tablet by mouth 2 (two) times daily.     fenofibrate (TRICOR) 145 MG tablet Take 1 tablet (145 mg total) by mouth daily. 30 tablet 5   fexofenadine (ALLEGRA) 180 MG tablet Take 1 tablet (180 mg total) by mouth daily. 90 tablet 1   fluticasone (FLONASE) 50 MCG/ACT nasal spray SHAKE LIQUID AND USE 2 SPRAYS IN EACH NOSTRIL DAILY 16 g 7   metoprolol succinate (TOPROL-XL) 50 MG 24 hr tablet Take 1 tablet (50 mg total) by mouth daily. 90 tablet 1   montelukast (SINGULAIR) 10 MG tablet TAKE 1 TABLET(10 MG) BY MOUTH AT BEDTIME 90 tablet 0   Multiple Vitamin (MULTIVITAMIN) tablet Take 1 tablet by mouth daily.      Omega-3 Fatty Acids (FISH OIL) 1000 MG CAPS Take 1,000 mg by mouth 2 (two) times daily.     omeprazole (PRILOSEC) 40 MG capsule Take 1 capsule (40 mg total) by mouth daily. 90 capsule 4   oxybutynin (DITROPAN-XL) 10 MG 24 hr tablet Take 2 tablets (20 mg total) by mouth at bedtime. 60 tablet 0   QUEtiapine (SEROQUEL XR) 200 MG 24 hr tablet Take 1 tablet (200 mg total) by mouth at bedtime for 7 days.  7 tablet 0   QUEtiapine (SEROQUEL) 200 MG tablet Take 1 tablet (200 mg total) by mouth at bedtime. 90 tablet 0   venlafaxine XR (EFFEXOR XR) 75 MG 24 hr capsule Take 3 capsules (225 mg total) by mouth daily. 270 capsule 1   venlafaxine XR (EFFEXOR-XR) 75 MG 24 hr capsule Take 3 capsules (225 mg total) by mouth daily with breakfast for 7 days. 21 capsule 0   No current facility-administered medications for this visit.     Musculoskeletal: Strength & Muscle Tone:  N/A Gait & Station:  N/A Patient leans: N/A  Psychiatric Specialty Exam: Review of Systems  There were no vitals taken for this visit.There is no height or weight on file to calculate BMI.  General Appearance: {Appearance:22683}  Eye Contact:  {BHH EYE CONTACT:22684}  Speech:  Clear and Coherent  Volume:  Normal  Mood:  {BHH MOOD:22306}  Affect:  {Affect (PAA):22687}  Thought Process:  Coherent  Orientation:  Full (Time, Place, and Person)  Thought Content: Logical   Suicidal Thoughts:  {ST/HT (PAA):22692}  Homicidal Thoughts:  {ST/HT (PAA):22692}  Memory:  Immediate;   Good  Judgement:  {Judgement (PAA):22694}  Insight:  {Insight (PAA):22695}  Psychomotor Activity:  Normal  Concentration:  Concentration: Good and Attention Span: Good  Recall:  Good  Fund of Knowledge: Good  Language: Good  Akathisia:  No  Handed:  Right  AIMS (if indicated): not done  Assets:  Communication Skills Desire for Improvement  ADL's:  Intact  Cognition: WNL  Sleep:  {BHH GOOD/FAIR/POOR:22877}   Screenings: GAD-7    Flowsheet Row Office  Visit from 01/15/2022 in [facility] Visit from 12/29/2021 in [facility] Visit from 09/23/2021 in [facility] Visit from 09/09/2021 in [facility] Visit from 11/15/2020 in [facility]  Total GAD-7 Score '4 3 1 '$ 0 15      PHQ2-9    [place] Visit from 01/15/2022 in [facility] Visit from 12/29/2021 in [facility] Video Visit from 10/24/2021 in [facility] Video Visit from 09/29/2021 in [facility] Visit from 09/23/2021 in [facility]  PHQ-2 Total Score 0 0 0 0 1  PHQ-9 Total Score 3 4 -- 0 5      Flowsheet Row Video Visit from 05/06/2021 in [facility] Video Visit from 03/25/2021 in [facility] No Risk No Risk        Assessment and Plan:  Elizabeth Malone is a 71 y.o. year old female with a history of  bipolar II disorder, history of stage III CKD (resolved after ACI was discontinued per patient), hypertension, GERD, PONV, hyperlipidemia, who presents for follow up appointment for below.   1. Bipolar II disorder ([place]) 2. Anxiety Exam is notable for tearfulness during the visit, and she reports significant worsening in depressive symptoms and anxiety in the context of marital conflict with her husband, for relapsing alcohol use. Other psychosocial stressors includes his brother, who was diagnosed with transverse myelitis.  We uptitrate quetiapine to optimize treatment for bipolar 2 disorder and an anxiety.  Discussed potential metabolic side effect and EPS.  Will continue venlafaxine to target depression and anxiety.  Will continue BuSpar for anxiety. Noted that although she was diagnosed with bipolar 1 disorder by other provider, she had only hypomanic symptoms, and mainly struggled with depression.  Will continue to monitor.  Plan  (She requested that no refills to be given as she has piles of medication. She will contact the office if she needs any refills.) Continue venlafaxine 225 mg daily Increase quetiapine 200 mg at night Continue Buspar 15 mg three times a day Next appointment- 7/12 at 1 PM for 30 mins, video- she declined to have another in person visit, although it was recommended.   Past trials of medication: Abilify, lithium (xerostomia), gabapentin    The patient demonstrates the following risk factors for suicide: Chronic risk factors for suicide include: psychiatric disorder of bipolar disorder and previous suicide attempts of cutting, putting cord around her neck, overdose on medication . Acute risk factors for suicide include: unemployment. Protective factors for this patient include: positive social support, coping skills, and hope for the future. Considering these factors, the overall suicide risk at this point appears to be low. Patient is appropriate for outpatient follow up.              Collaboration of Care: Collaboration of Care: {BH OP Collaboration of Care:21014065}  Patient/Guardian was advised Release of Information must be obtained prior to any record release in order to collaborate their care with an outside provider. Patient/Guardian was advised if they have not already done so to contact the registration department to sign all necessary forms in order for [place] to release information regarding their care.   Consent: Patient/Guardian gives verbal consent for treatment and assignment of benefits for services provided during this visit. Patient/Guardian expressed understanding and agreed to proceed.    [name], MD 03/22/2022, 10:16 AM

## 2022-03-24 ENCOUNTER — Other Ambulatory Visit: Payer: Self-pay

## 2022-03-24 DIAGNOSIS — I10 Essential (primary) hypertension: Secondary | ICD-10-CM

## 2022-03-24 MED ORDER — METOPROLOL SUCCINATE ER 50 MG PO TB24: 50.0000 mg | ORAL_TABLET | Freq: Every day | ORAL | 0 refills | Status: DC

## 2022-03-24 NOTE — Telephone Encounter (Signed)
LVM asking patient to call back to schedule an appointment in October with Malachy Mood

## 2022-03-24 NOTE — Telephone Encounter (Signed)
Patient is not in town and is currently in [place], forgot meds at home and needs refill of Metoprolol.

## 2022-03-25 ENCOUNTER — Telehealth: Payer: Medicare PPO | Admitting: Psychiatry

## 2022-03-26 ENCOUNTER — Telehealth: Payer: Self-pay | Admitting: Family

## 2022-03-26 NOTE — Telephone Encounter (Signed)
Patient needs a parking placard application completed. Patient has competed her part. Application was  placed in the providers folder. Please call patient when it can be picked up.

## 2022-03-26 NOTE — Telephone Encounter (Signed)
Patient scheduled with Jolene 10/4

## 2022-03-27 NOTE — Telephone Encounter (Signed)
Spoke with patient and notified that her requested Handicap Placard forms have been completed and ready for pick up. Patient verbalized understanding and has no further questions at this time.

## 2022-04-01 NOTE — Progress Notes (Signed)
Virtual Visit via Video Note  I connected with Elizabeth Malone on 04/03/22 at 10:30 AM EDT by a video enabled telemedicine application and verified that I am speaking with the correct person using two identifiers.  Location: Patient: home Provider: office Persons participated in the visit- patient, provider    I discussed the limitations of evaluation and management by telemedicine and the availability of in person appointments. The patient expressed understanding and agreed to proceed.     I discussed the assessment and treatment plan with the patient. The patient was provided an opportunity to ask questions and all were answered. The patient agreed with the plan and demonstrated an understanding of the instructions.   The patient was advised to call back or seek an in-person evaluation if the symptoms worsen or if the condition fails to improve as anticipated.  I provided 15 minutes of non-face-to-face time during this encounter.   [name], MD    [facility] MD/PA/NP OP Progress Note  04/03/2022 11:02 AM Elizabeth Malone  MRN:  [id number]  Chief Complaint:  Chief Complaint  Patient presents with   Follow-up   Other   HPI:  This is a follow-up appointment for bipolar disorder and anxiety.  She states that she was at her son and her daughter's place as she could not stand with her husband anymore.  She stayed there for a few weeks.  Things has been good after she has returned home.  Although he continues to drink, it is much less compared to before.  She does not think he will be interested in a meeting, or seeing a mental health professional.  She does not think PCP will be helpful, and he is not interested in switching PCP either.  However, she thinks she has seen the worst, and things are going in a good direction.  She has started to join [employer] group, and enjoyed having lunch with her friends.  Although she gained a few pounds as she has not been able to be active since  being back home, she is planning to start waterobic.  She has insomnia, which usually improves with good exercise.  She denies feeling depressed or anxiety.  She denies SI, HI.  She denies decreased need for sleep or euphonia.  She denies alcohol use or drug use.  She has not noticed any side effect from quetiapine, and is willing to stay on the same regimen. She apologized her demeanor at the last visit in the setting of the situation with her husband.   Daily routine: sit on the porch with her husband Exercise: Employment: retired in 2008, used to work as Administrator Support: Household: husband Marital status: married for 50 years in 2022 Number of children: 2 (1 son and 1 daughter in [place]) She grew up in [place].  She reports lack of nurturing as a child.  Her father died from MI when young. Her mother was driving with her girlfriend, being around with married man while Lorin was sitting in the back of the car. Had good relationship with her grandmother  Visit Diagnosis:    ICD-10-CM   1. Bipolar II disorder ([place])  F31.81     2. Anxiety  F41.9       Past Psychiatric History: Please see initial evaluation for full details. I have reviewed the history. No updates at this time.     Past Medical History:  Past Medical History:  Diagnosis Date   Anxiety  Bipolar disorder (HCC)    CKD (chronic kidney disease) stage 3, GFR 30-59 ml/min (HCC)    Complication of anesthesia    Depression    GERD (gastroesophageal reflux disease)    occasionally has to use tums   Headache    History of diverticulosis    Hyperlipidemia    Macular degeneration    Overactive bladder    PONV (postoperative nausea and vomiting)     Past Surgical History:  Procedure Laterality Date   COLONOSCOPY WITH PROPOFOL N/A 10/07/2017   Procedure: COLONOSCOPY WITH PROPOFOL;  Surgeon: [name], MD;  Location: [place];  Service: Endoscopy;  Laterality: N/A;   FOOT SURGERY  Bilateral    KNEE SURGERY Left    POLYPECTOMY  10/07/2017   Procedure: POLYPECTOMY;  Surgeon: [name], MD;  Location: [place];  Service: Endoscopy;;   TONSILLECTOMY AND ADENOIDECTOMY      Family Psychiatric History: Please see initial evaluation for full details. I have reviewed the history. No updates at this time.     Family History:  Family History  Problem Relation Age of Onset   Alzheimer's disease Mother    Bipolar disorder Mother    Depression Mother    Heart attack Father    Diabetes Brother    Obesity Brother    Depression Daughter    Stroke Maternal Grandmother    Depression Maternal Grandfather    Stroke Paternal Grandmother    Heart attack Paternal Grandfather    Breast cancer Neg Hx     Social History:  Social History   Socioeconomic History   Marital status: Married    Spouse name: Not on file   Number of children: 2   Years of education: Not on file   Highest education level: Bachelor's degree (e.g., BA, AB, BS)  Occupational History   Occupation: retired   Tobacco Use   Smoking status: Former    Types: Cigarettes    Start date: 07/09/1967    Quit date: 07/08/1985    Years since quitting: 36.7   Smokeless tobacco: Never  Vaping Use   Vaping Use: Never used  Substance and Sexual Activity   Alcohol use: Yes    Alcohol/week: 3.0 standard drinks of alcohol    Types: 3 Shots of liquor per week    Comment: occassional   Drug use: No   Sexual activity: Not Currently  Other Topics Concern   Not on file  Social History Narrative   Not on file   Social Determinants of Health   Financial Resource Strain: Low Risk  (04/07/2021)   Overall Financial Resource Strain (CARDIA)    Difficulty of Paying Living Expenses: Not hard at all  Food Insecurity: No Food Insecurity (04/07/2021)   Hunger Vital Sign    Worried About Running Out of Food in the Last Year: Never true    Ran Out of Food in the Last Year: Never true  Transportation Needs:  No Transportation Needs (04/07/2021)   PRAPARE - Hydrologist (Medical): No    Lack of Transportation (Non-Medical): No  Physical Activity: Inactive (04/07/2021)   Exercise Vital Sign    Days of Exercise per Week: 0 days    Minutes of Exercise per Session: 0 min  Stress: No Stress Concern Present (04/07/2021)   [place]    Feeling of Stress : Not at all  Social Connections: Moderately Integrated (07/19/2017)   Social Connection  and Isolation Panel [NHANES]    Frequency of Communication with Friends and Family: Once a week    Frequency of Social Gatherings with Friends and Family: Once a week    Attends Religious Services: More than 4 times per year    Active Member of [employer] or Organizations: Yes    Attends Music therapist: More than 4 times per year    Marital Status: Married    Allergies:  Allergies  Allergen Reactions   Fluoxetine Other (See Comments)   Hydrocodone-Chlorpheniramine Other (See Comments)   Paroxetine Hcl Other (See Comments)   Penicillin G Benzathine Other (See Comments)   Penicillin V Potassium Other (See Comments)   Clindamycin Hcl Rash and Other (See Comments)    Katherina Right' syndrome   Lamotrigine Rash    Metabolic Disorder Labs: Lab Results  Component Value Date   HGBA1C 5.3 12/22/2021   No results found for: "PROLACTIN" Lab Results  Component Value Date   CHOL 219 (H) 12/22/2021   TRIG 232 (H) 12/22/2021   HDL 66 12/22/2021   CHOLHDL 3.3 12/22/2021   VLDL 39 (H) 08/17/2017   LDLCALC 113 (H) 12/22/2021   LDLCALC 129 (H) 10/24/2021   Lab Results  Component Value Date   TSH 1.100 12/22/2021   TSH 1.580 10/24/2021    Therapeutic Level Labs: Lab Results  Component Value Date   LITHIUM 0.66 05/07/2021   LITHIUM 0.72 04/22/2020   No results found for: "VALPROATE" No results found for: "CBMZ"  Current Medications: Current  Outpatient Medications  Medication Sig Dispense Refill   atorvastatin (LIPITOR) 80 MG tablet Take 1 tablet (80 mg total) by mouth daily. 30 tablet 0   benazepril (LOTENSIN) 40 MG tablet Take 1 tablet (40 mg total) by mouth daily. 30 tablet 0   Calcium Citrate-Vitamin D (CALCIUM + D PO) Take 1 tablet by mouth 2 (two) times daily.     fenofibrate (TRICOR) 145 MG tablet Take 1 tablet (145 mg total) by mouth daily. 30 tablet 5   fexofenadine (ALLEGRA) 180 MG tablet Take 1 tablet (180 mg total) by mouth daily. 90 tablet 1   fluticasone (FLONASE) 50 MCG/ACT nasal spray SHAKE LIQUID AND USE 2 SPRAYS IN EACH NOSTRIL DAILY 16 g 7   metoprolol succinate (TOPROL-XL) 50 MG 24 hr tablet Take 1 tablet (50 mg total) by mouth daily. 90 tablet 0   montelukast (SINGULAIR) 10 MG tablet TAKE 1 TABLET(10 MG) BY MOUTH AT BEDTIME 90 tablet 0   Multiple Vitamin (MULTIVITAMIN) tablet Take 1 tablet by mouth daily.     Omega-3 Fatty Acids (FISH OIL) 1000 MG CAPS Take 1,000 mg by mouth 2 (two) times daily.     omeprazole (PRILOSEC) 40 MG capsule Take 1 capsule (40 mg total) by mouth daily. 90 capsule 4   oxybutynin (DITROPAN-XL) 10 MG 24 hr tablet Take 2 tablets (20 mg total) by mouth at bedtime. 60 tablet 0   QUEtiapine (SEROQUEL XR) 200 MG 24 hr tablet Take 1 tablet (200 mg total) by mouth at bedtime for 7 days. 7 tablet 0   QUEtiapine (SEROQUEL) 200 MG tablet Take 1 tablet (200 mg total) by mouth at bedtime. 90 tablet 0   venlafaxine XR (EFFEXOR XR) 75 MG 24 hr capsule Take 3 capsules (225 mg total) by mouth daily. 270 capsule 1   venlafaxine XR (EFFEXOR-XR) 75 MG 24 hr capsule Take 3 capsules (225 mg total) by mouth daily with breakfast for 7 days. 21 capsule 0  No current facility-administered medications for this visit.     Musculoskeletal: Strength & Muscle Tone:  N/A Gait & Station:  N/A Patient leans: N/A  Psychiatric Specialty Exam: Review of Systems  Psychiatric/Behavioral:  Positive for sleep  disturbance. Negative for agitation, behavioral problems, confusion, decreased concentration, dysphoric mood, hallucinations, self-injury and suicidal ideas. The patient is not nervous/anxious and is not hyperactive.   All other systems reviewed and are negative.   There were no vitals taken for this visit.There is no height or weight on file to calculate BMI.  General Appearance: Fairly Groomed  Eye Contact:  Good  Speech:  Clear and Coherent  Volume:  Normal  Mood:   good  Affect:  Appropriate, Congruent, and Full Range  Thought Process:  Coherent  Orientation:  Full (Time, Place, and Person)  Thought Content: Logical   Suicidal Thoughts:  No  Homicidal Thoughts:  No  Memory:  Immediate;   Good  Judgement:  Good  Insight:  Good  Psychomotor Activity:  Normal  Concentration:  Concentration: Good and Attention Span: Good  Recall:  Good  Fund of Knowledge: Good  Language: Good  Akathisia:  No  Handed:  Right  AIMS (if indicated): not done  Assets:  Communication Skills Desire for Improvement  ADL's:  Intact  Cognition: WNL  Sleep:  Fair   Screenings: GAD-7    Flowsheet Row Office Visit from 01/15/2022 in [place] Visit from 12/29/2021 in [place] Visit from 09/23/2021 in [place] Visit from 09/09/2021 in [place] Visit from 11/15/2020 in [place]  Total GAD-7 Score '4 3 1 '$ 0 15      PHQ2-9    [place] Office Visit from 01/15/2022 in [place] Visit from 12/29/2021 in [facility] Video Visit from 10/24/2021 in [place] Video Visit from 09/29/2021 in [place] Visit from 09/23/2021 in [place]  PHQ-2 Total Score 0 0 0 0 1  PHQ-9 Total Score 3 4 -- 0 5      Flowsheet Row Video Visit from 05/06/2021 in [place] Video Visit from 03/25/2021 in  [place] No Risk No Risk        Assessment and Plan:  Elizabeth Malone is a 71 y.o. year old female with a history of  bipolar II disorder, history of stage III CKD (resolved after ACI was discontinued per patient), hypertension, GERD, PONV, hyperlipidemia, who presents for follow up appointment for below.   1. Bipolar II disorder ([place]) 2. Anxiety There has been significant improvement in depression and anxiety since having a few weeks of being away from her husband, for relapsing alcohol use. This also coincided with uptitration of quetiapine.  Other psychosocial stressors includes his brother, who was diagnosed with transverse myelitis.  Will continue current medication regimen.  Will continue venlafaxine to target depression and anxiety.  Will continue quetiapine for mood dysregulation.  Will continue BuSpar for anxiety.  Noted that although she was diagnosed with bipolar 1 disorder by other provider, she had only hypomanic symptoms, and mainly struggled with depression.  Will continue to monitor.   Plan (She requested that no refills to be given as she has piles of medication. She will contact the office if she needs any refills.) Continue venlafaxine 225 mg daily Continue quetiapine 200 mg at night Continue Buspar 15 mg three times a day  Next appointment- 9/1 at 10:30 for 30 mins, video  Past trials of medication: Abilify, lithium (xerostomia), gabapentin    The patient demonstrates the following risk factors for suicide: Chronic risk factors for suicide include: psychiatric disorder of bipolar disorder and previous suicide attempts of cutting, putting cord around her neck, overdose on medication . Acute risk factors for suicide include: unemployment. Protective factors for this patient include: positive social support, coping skills, and hope for the future. Considering these factors, the overall suicide risk at this point appears to be  low. Patient is appropriate for outpatient follow up.             Collaboration of Care: Collaboration of Care: Other N/A  Patient/Guardian was advised Release of Information must be obtained prior to any record release in order to collaborate their care with an outside provider. Patient/Guardian was advised if they have not already done so to contact the registration department to sign all necessary forms in order for [place] to release information regarding their care.   Consent: Patient/Guardian gives verbal consent for treatment and assignment of benefits for services provided during this visit. Patient/Guardian expressed understanding and agreed to proceed.    [name], MD 04/03/2022, 11:02 AM

## 2022-04-03 ENCOUNTER — Encounter: Payer: Self-pay | Admitting: Psychiatry

## 2022-04-03 ENCOUNTER — Telehealth (INDEPENDENT_AMBULATORY_CARE_PROVIDER_SITE_OTHER): Payer: Medicare PPO | Admitting: Psychiatry

## 2022-04-03 DIAGNOSIS — F3181 Bipolar II disorder: Secondary | ICD-10-CM

## 2022-04-03 DIAGNOSIS — F419 Anxiety disorder, unspecified: Secondary | ICD-10-CM | POA: Diagnosis not present

## 2022-04-03 NOTE — Patient Instructions (Signed)
Continue venlafaxine 225 mg daily Continue quetiapine 200 mg at night Continue Buspar 15 mg three times a day Next appointment- 9/1 at 10:30

## 2022-04-06 ENCOUNTER — Ambulatory Visit: Payer: Medicare PPO | Admitting: Nurse Practitioner

## 2022-04-06 ENCOUNTER — Ambulatory Visit: Payer: Self-pay

## 2022-04-06 ENCOUNTER — Encounter: Payer: Self-pay | Admitting: Nurse Practitioner

## 2022-04-06 ENCOUNTER — Telehealth: Payer: Self-pay | Admitting: Nurse Practitioner

## 2022-04-06 VITALS — BP 121/76 | HR 58 | Temp 98.6°F | Ht 64.0 in | Wt 196.9 lb

## 2022-04-06 DIAGNOSIS — L0390 Cellulitis, unspecified: Secondary | ICD-10-CM | POA: Insufficient documentation

## 2022-04-06 DIAGNOSIS — W5501XA Bitten by cat, initial encounter: Secondary | ICD-10-CM | POA: Diagnosis not present

## 2022-04-06 DIAGNOSIS — L03115 Cellulitis of right lower limb: Secondary | ICD-10-CM

## 2022-04-06 MED ORDER — MUPIROCIN 2 % EX OINT: 1.0000 | TOPICAL_OINTMENT | Freq: Two times a day (BID) | CUTANEOUS | 0 refills | Status: DC

## 2022-04-06 MED ORDER — DOXYCYCLINE HYCLATE 100 MG PO TABS: 100.0000 mg | ORAL_TABLET | Freq: Two times a day (BID) | ORAL | 0 refills | Status: AC

## 2022-04-06 NOTE — Progress Notes (Signed)
BP 121/76   Pulse (!) 58   Temp 98.6 F (37 C) (Oral)   Ht 5' 4" (1.626 m)   Wt 196 lb 14.4 oz (89.3 kg)   SpO2 96%   BMI 33.80 kg/m    Subjective:    Patient ID: Elizabeth Malone, female    DOB: May 14, 1951, 71 y.o.   MRN: [id number]  HPI: Elizabeth Malone is a 71 y.o. female  Chief Complaint  Patient presents with   Animal Bite    Patient is here for a possible infected cat bite. Patient says she was bit by her cat on Thursday on the back of her right leg. Patient says one puncture wounds healed, but the other one did not. Patient says she cleaned it with alcohol.    SKIN INFECTION Had a cat bite take place at home, she thinks she stood on its tail and then it bit her.  Happened on Thursday, has been doing self treatment, one puncture wound healed and the other did not.  Duration: days Location: back of right calf History of trauma in area: yes Pain: yes Quality: yes Severity: mild and tender Redness: yes Swelling: yes Oozing: no Pus: no Fevers: no Nausea/vomiting: no Status: stable Treatments attempted:warm compresses  Tetanus: UTD -- due next year  Relevant past medical, surgical, family and social history reviewed and updated as indicated. Interim medical history since our last visit reviewed. Allergies and medications reviewed and updated.  Review of Systems  Constitutional:  Negative for activity change, appetite change, diaphoresis, fatigue and fever.  Respiratory:  Negative for cough, chest tightness and shortness of breath.   Cardiovascular:  Negative for chest pain, palpitations and leg swelling.  Skin:  Positive for wound.  Neurological: Negative.   Psychiatric/Behavioral: Negative.      Per HPI unless specifically indicated above     Objective:    BP 121/76   Pulse (!) 58   Temp 98.6 F (37 C) (Oral)   Ht 5' 4" (1.626 m)   Wt 196 lb 14.4 oz (89.3 kg)   SpO2 96%   BMI 33.80 kg/m   Wt Readings from Last 3 Encounters:  04/06/22 196 lb 14.4 oz  (89.3 kg)  01/15/22 203 lb 3.2 oz (92.2 kg)  12/29/21 204 lb 6.4 oz (92.7 kg)    Physical Exam Vitals and nursing note reviewed.  Constitutional:      General: She is awake. She is not in acute distress.    Appearance: She is well-developed and well-groomed. She is obese. She is not ill-appearing or toxic-appearing.  HENT:     Head: Normocephalic.     Right Ear: Hearing normal.     Left Ear: Hearing normal.  Eyes:     General: Lids are normal.        Right eye: No discharge.        Left eye: No discharge.     Conjunctiva/sclera: Conjunctivae normal.     Pupils: Pupils are equal, round, and reactive to light.  Neck:     Vascular: No carotid bruit.  Cardiovascular:     Rate and Rhythm: Normal rate and regular rhythm.     Heart sounds: Normal heart sounds. No murmur heard.    No gallop.  Pulmonary:     Effort: Pulmonary effort is normal. No accessory muscle usage or respiratory distress.     Breath sounds: Normal breath sounds.  Abdominal:     General: Bowel sounds are normal.  Palpations: Abdomen is soft. There is no hepatomegaly or splenomegaly.  Musculoskeletal:     Cervical back: Normal range of motion and neck supple.     Right lower leg: No edema.     Left lower leg: No edema.  Skin:    General: Skin is warm and dry.       Neurological:     Mental Status: She is alert and oriented to person, place, and time.  Psychiatric:        Attention and Perception: Attention normal.        Mood and Affect: Mood normal.        Speech: Speech normal.        Behavior: Behavior normal. Behavior is cooperative.        Thought Content: Thought content normal.    Results for orders placed or performed in visit on 12/22/21  Lipid panel  Result Value Ref Range   Cholesterol, Total 219 (H) 100 - 199 mg/dL   Triglycerides 232 (H) 0 - 149 mg/dL   HDL 66 >39 mg/dL   VLDL Cholesterol Cal 40 5 - 40 mg/dL   LDL Chol Calc (NIH) 113 (H) 0 - 99 mg/dL   Chol/HDL Ratio 3.3 0.0 - 4.4  ratio  CBC with Differential/Platelet  Result Value Ref Range   WBC 6.2 3.4 - 10.8 x10E3/uL   RBC 4.75 3.77 - 5.28 x10E6/uL   Hemoglobin 14.5 11.1 - 15.9 g/dL   Hematocrit 43.8 34.0 - 46.6 %   MCV 92 79 - 97 fL   MCH 30.5 26.6 - 33.0 pg   MCHC 33.1 31.5 - 35.7 g/dL   RDW 13.8 11.7 - 15.4 %   Platelets 234 150 - 450 x10E3/uL   Neutrophils 59 Not Estab. %   Lymphs 29 Not Estab. %   Monocytes 8 Not Estab. %   Eos 2 Not Estab. %   Basos 1 Not Estab. %   Neutrophils Absolute 3.7 1.4 - 7.0 x10E3/uL   Lymphocytes Absolute 1.8 0.7 - 3.1 x10E3/uL   Monocytes Absolute 0.5 0.1 - 0.9 x10E3/uL   EOS (ABSOLUTE) 0.1 0.0 - 0.4 x10E3/uL   Basophils Absolute 0.0 0.0 - 0.2 x10E3/uL   Immature Granulocytes 1 Not Estab. %   Immature Grans (Abs) 0.0 0.0 - 0.1 x10E3/uL  Thyroid Panel With TSH  Result Value Ref Range   TSH 1.100 0.450 - 4.500 uIU/mL   T4, Total 7.1 4.5 - 12.0 ug/dL   T3 Uptake Ratio 26 24 - 39 %   Free Thyroxine Index 1.8 1.2 - 4.9  Bayer DCA Hb A1c Waived  Result Value Ref Range   HB A1C (BAYER DCA - WAIVED) 5.3 4.8 - 5.6 %  Comprehensive metabolic panel  Result Value Ref Range   Glucose 103 (H) 70 - 99 mg/dL   BUN 10 8 - 27 mg/dL   Creatinine, Ser 0.82 0.57 - 1.00 mg/dL   eGFR 77 >59 mL/min/1.73   BUN/Creatinine Ratio 12 12 - 28   Sodium 142 134 - 144 mmol/L   Potassium 4.2 3.5 - 5.2 mmol/L   Chloride 104 96 - 106 mmol/L   CO2 27 20 - 29 mmol/L   Calcium 10.0 8.7 - 10.3 mg/dL   Total Protein 6.9 6.0 - 8.5 g/dL   Albumin 4.6 3.8 - 4.8 g/dL   Globulin, Total 2.3 1.5 - 4.5 g/dL   Albumin/Globulin Ratio 2.0 1.2 - 2.2   Bilirubin Total 0.5 0.0 - 1.2 mg/dL   Alkaline  Phosphatase 140 (H) 44 - 121 IU/L   AST 18 0 - 40 IU/L   ALT 28 0 - 32 IU/L      Assessment & Plan:   Problem List Items Addressed This Visit       Other   Cellulitis - Primary    Secondary to cat bite, is up to date on Td vaccine.  At this time will treat with Doxycycline 100 MG BID x 7 days, allergic  to [facility].  Mupirocin ointment ordered to apply to wound.  Recommend warm compresses a few times daily and to mark erythema daily to ensure no worsening.  Tylenol as needed for pain.  Return in one week for recheck.      Other Visit Diagnoses     Cat bite, initial encounter       Refer to cellulitis plan of care.        Follow up plan: Return in about 1 week (around 04/13/2022) for CAT BITE.

## 2022-04-06 NOTE — Assessment & Plan Note (Signed)
Secondary to cat bite, is up to date on Td vaccine.  At this time will treat with Doxycycline 100 MG BID x 7 days, allergic to [facility].  Mupirocin ointment ordered to apply to wound.  Recommend warm compresses a few times daily and to mark erythema daily to ensure no worsening.  Tylenol as needed for pain.  Return in one week for recheck.

## 2022-04-06 NOTE — Telephone Encounter (Signed)
  Chief Complaint: Infected cat bit Symptoms: red swollen lump under skin Frequency: thurs Pertinent Negatives: Patient denies  Disposition: '[]'$ ED /'[]'$ Urgent Care (no appt availability in office) / '[x]'$ Appointment(In office/virtual)/ '[]'$  [place] [facility]/ '[]'$ Home Care/ '[]'$ Refused Recommended Disposition /'[]'$ [place] [facility]/ '[]'$  Follow-up with PCP Additional Notes: Pt was bitten by her cat on thurs. 1 of the 2 puncture wounds is not red and swollen with a lump under the skin.   Reason for Disposition  [1] Puncture wound (hole through the skin) AND [2] from a cat bite (or deep claw puncture wound)  Answer Assessment - Initial Assessment Questions 1. ANIMAL: "What type of animal caused the bite?" "Is the injury from a bite or a claw?" If the animal is a dog or a cat, ask: "Was it a pet or a stray?" "Was it acting ill or behaving strangely?"     Cat 2. LOCATION: "Where is the bite located?"      Lower leg 3. SIZE: "How big is the bite?" "What does it look like?"      Puncture 4. ONSET: "When did the bite happen?" (Minutes or hours ago)      Thursday 5. CIRCUMSTANCES: "Tell me how this happened."      Stepped on cat's tail 6. TETANUS: "When was your last tetanus booster?"     yes 7. RABIES VACCINE: For dog or cat bites, ask: "Do you know if the pet is vaccinated against rabies?"  (e.g., yes, no, overdue for rabies shot, unknown)     yes 8. PREGNANCY: "Is there any chance you are pregnant?" "When was your last menstrual period?"     na  Protocols used: Animal Bite-A-AH

## 2022-04-06 NOTE — Telephone Encounter (Signed)
Pt is calling to cancel AWV. Please advise

## 2022-04-06 NOTE — Patient Instructions (Signed)

## 2022-04-09 ENCOUNTER — Ambulatory Visit: Payer: Medicare PPO

## 2022-04-09 ENCOUNTER — Ambulatory Visit: Payer: Self-pay

## 2022-04-09 NOTE — Telephone Encounter (Signed)
Bite mark has redness that is larger now and bright red. Wound has black in the middle. Had yellow drainage yesterday. No fever. Still on antibiotic. Does not look like it is getting better. Answer Assessment - Initial Assessment Questions 1. ANIMAL: "What type of animal caused the bite?" "Is the injury from a bite or a claw?" If the animal is a dog or a cat, ask: "Was it a pet or a stray?" "Was it acting ill or behaving strangely?"     Cat 2. LOCATION: "Where is the bite located?"      Right lower leg 3. SIZE: "How big is the bite?" "What does it look like?"      Black 4. ONSET: "When did the bite happen?" (Minutes or hours ago)      Days ago 5. CIRCUMSTANCES: "Tell me how this happened."      N/a 6. TETANUS: "When was your last tetanus booster?"     N/a 7. RABIES VACCINE: For dog or cat bites, ask: "Do you know if the pet is vaccinated against rabies?"  (e.g., yes, no, overdue for rabies shot, unknown)     Yes 8. PREGNANCY: "Is there any chance you are pregnant?" "When was your last menstrual period?"     No  Protocols used: Animal Bite-A-AH

## 2022-04-09 NOTE — Telephone Encounter (Signed)
Summary: animal bite / rx req   The patient has been previously seen for an animal bite on their right ankle   The patient has called to share that the redness has increased around the bite   The patient has been prescribed medication for the wound but would like to know if they should be taking something stronger   Please contact further when possible    Attempted to reach pt. LM on VM to call back to discuss sx.

## 2022-04-09 NOTE — Telephone Encounter (Signed)
Summary: animal bite / rx req   The patient has been previously seen for an animal bite on their right ankle   The patient has called to share that the redness has increased around the bite   The patient has been prescribed medication for the wound but would like to know if they should be taking something stronger   Please contact further when possible      Called pt - [id number] Will forward to practice.

## 2022-04-09 NOTE — Telephone Encounter (Signed)
Summary: animal bite / rx req   The patient has been previously seen for an animal bite on their right ankle   The patient has called to share that the redness has increased around the bite   The patient has been prescribed medication for the wound but would like to know if they should be taking something stronger   Please contact further when possible     Called and LM on VM to call back to discuss sx.

## 2022-04-09 NOTE — Telephone Encounter (Signed)
Pt scheduled tomorrow @ 1:40

## 2022-04-10 ENCOUNTER — Ambulatory Visit: Payer: Medicare PPO | Admitting: Nurse Practitioner

## 2022-04-10 ENCOUNTER — Ambulatory Visit: Payer: Medicare PPO

## 2022-04-16 ENCOUNTER — Ambulatory Visit: Payer: Medicare PPO | Admitting: Physician Assistant

## 2022-04-16 ENCOUNTER — Encounter: Payer: Self-pay | Admitting: Physician Assistant

## 2022-04-16 VITALS — BP 110/76 | HR 63 | Temp 98.1°F | Ht 64.02 in | Wt 196.0 lb

## 2022-04-16 DIAGNOSIS — L089 Local infection of the skin and subcutaneous tissue, unspecified: Secondary | ICD-10-CM

## 2022-04-16 DIAGNOSIS — S81852D Open bite, left lower leg, subsequent encounter: Secondary | ICD-10-CM

## 2022-04-16 DIAGNOSIS — W5501XD Bitten by cat, subsequent encounter: Secondary | ICD-10-CM | POA: Diagnosis not present

## 2022-04-16 NOTE — Progress Notes (Signed)
Acute Office Visit   Patient: Elizabeth Malone   DOB: 08/14/1951   71 y.o. Female  MRN: [id number] Visit Date: 04/16/2022  Today's healthcare provider: Dani Gobble Darlinda Bellows, PA-C  Introduced myself to the patient as a Journalist, newspaper and provided education on APPs in clinical practice.    Chief Complaint  Patient presents with   Animal Bite    F/u on Cat bite from 7/20, bite looks better.   Subjective    HPI HPI     Animal Bite    Additional comments: F/u on Cat bite from 7/20, bite looks better.      Last edited by Jerelene Redden, CMA on 04/16/2022 10:04 AM.        Cat bite that occurred on 04/02/22 She wad treated with Doxy and Mupirocin   States she had some drainage last week with bright redness but states that has improved Reports concerns for returning to swimming and other regular activities while this is improving  Denies current drainage, pain along the wound or with walking/ moving leg and foot    Medications: Outpatient Medications Prior to Visit  Medication Sig   atorvastatin (LIPITOR) 80 MG tablet Take 1 tablet (80 mg total) by mouth daily.   benazepril (LOTENSIN) 40 MG tablet Take 1 tablet (40 mg total) by mouth daily.   Calcium Citrate-Vitamin D (CALCIUM + D PO) Take 1 tablet by mouth 2 (two) times daily.   fenofibrate (TRICOR) 145 MG tablet Take 1 tablet (145 mg total) by mouth daily.   fexofenadine (ALLEGRA) 180 MG tablet Take 1 tablet (180 mg total) by mouth daily.   fluticasone (FLONASE) 50 MCG/ACT nasal spray SHAKE LIQUID AND USE 2 SPRAYS IN EACH NOSTRIL DAILY   metoprolol succinate (TOPROL-XL) 50 MG 24 hr tablet Take 1 tablet (50 mg total) by mouth daily.   montelukast (SINGULAIR) 10 MG tablet TAKE 1 TABLET(10 MG) BY MOUTH AT BEDTIME   Multiple Vitamin (MULTIVITAMIN) tablet Take 1 tablet by mouth daily.   mupirocin ointment (BACTROBAN) 2 % Apply 1 Application topically 2 (two) times daily.   Omega-3 Fatty Acids (FISH OIL) 1000 MG CAPS Take 1,000 mg by  mouth 2 (two) times daily.   omeprazole (PRILOSEC) 40 MG capsule Take 1 capsule (40 mg total) by mouth daily.   oxybutynin (DITROPAN-XL) 10 MG 24 hr tablet Take 2 tablets (20 mg total) by mouth at bedtime.   QUEtiapine (SEROQUEL) 200 MG tablet Take 1 tablet (200 mg total) by mouth at bedtime.   QUEtiapine (SEROQUEL XR) 200 MG 24 hr tablet Take 1 tablet (200 mg total) by mouth at bedtime for 7 days.   venlafaxine XR (EFFEXOR XR) 75 MG 24 hr capsule Take 3 capsules (225 mg total) by mouth daily.   venlafaxine XR (EFFEXOR-XR) 75 MG 24 hr capsule Take 3 capsules (225 mg total) by mouth daily with breakfast for 7 days.   No facility-administered medications prior to visit.    Review of Systems  Skin:  Positive for wound.       Objective    BP 110/76   Pulse 63   Temp 98.1 F (36.7 C) (Oral)   Ht 5' 4.02" (1.626 m)   Wt 196 lb (88.9 kg)   SpO2 98%   BMI 33.63 kg/m    Physical Exam Vitals reviewed.  Constitutional:      Appearance: Normal appearance.  HENT:     Head: Normocephalic and atraumatic.  Pulmonary:  Effort: Pulmonary effort is normal.  Musculoskeletal:     Cervical back: Normal range of motion and neck supple.  Skin:    General: Skin is warm.     Findings: Wound present.          Comments: Approx 5 cm in diameter area of dusky redness with central area scabbing of approx 1 mm  No drainage, tenderness to palpation or streaking   Neurological:     General: No focal deficit present.     Mental Status: She is alert and oriented to person, place, and time.     GCS: GCS eye subscore is 4. GCS verbal subscore is 5. GCS motor subscore is 6.  Psychiatric:        Attention and Perception: Attention and perception normal.        Mood and Affect: Mood and affect normal.        Speech: Speech normal.        Behavior: Behavior normal. Behavior is cooperative.       No results found for any visits on 04/16/22.  Assessment & Plan      No follow-ups on file.       Problem List Items Addressed This Visit   None Visit Diagnoses     Cat bite of left lower leg with infection, subsequent encounter    -  Primary Acute, ongoing concern, resolving  Patient is here for follow up for healing wound sustained from a cat bite on 04/02/22 She developed cellulitis and was treated with Doxycycline and Mupirocin, she has finished the Doxycycline and is still applying the Mupirocin as of today's apt PE is reassuring for normal healing of the wound- no drainage, streaking, tenderness to palpation or bright erythema present today Reviewed normal wound healing with patient and recommend she return to regular swimming and other activities once wound is fully closed without scab She is in agreement and will continue to use the Mupirocin until completely resolved Follow up as needed        No follow-ups on file.   I, Fontaine Kossman E Zadrian Mccauley, PA-C, have reviewed all documentation for this visit. The documentation on 04/16/22 for the exam, diagnosis, procedures, and orders are all accurate and complete.   Talitha Givens, MHS, PA-C [facility]

## 2022-04-25 ENCOUNTER — Other Ambulatory Visit: Payer: Self-pay | Admitting: Nurse Practitioner

## 2022-04-25 DIAGNOSIS — E7800 Pure hypercholesterolemia, unspecified: Secondary | ICD-10-CM

## 2022-04-27 NOTE — Telephone Encounter (Signed)
Notes to clinic:  Please assess rx for Dr. Levada Dy pt.      Requested Prescriptions  Pending Prescriptions Disp Refills   atorvastatin (LIPITOR) 80 MG tablet [Pharmacy Med Name: ATORVASTATIN '80MG'$  TABLETS] 90 tablet     Sig: TAKE 1 TABLET(80 MG) BY MOUTH DAILY     Cardiovascular:  Antilipid - Statins Failed - 04/25/2022  9:43 AM      Failed - Lipid Panel in normal range within the last 12 months    Cholesterol, Total  Date Value Ref Range Status  12/22/2021 219 (H) 100 - 199 mg/dL Final   Cholesterol Piccolo, Waived  Date Value Ref Range Status  08/17/2017 167 <200 mg/dL Final    Comment:                            Desirable                <200                         Borderline High      200- 239                         High                     >239    LDL Chol Calc (NIH)  Date Value Ref Range Status  12/22/2021 113 (H) 0 - 99 mg/dL Final   HDL  Date Value Ref Range Status  12/22/2021 66 >39 mg/dL Final   Triglycerides  Date Value Ref Range Status  12/22/2021 232 (H) 0 - 149 mg/dL Final   Triglycerides Piccolo,Waived  Date Value Ref Range Status  08/17/2017 195 (H) <150 mg/dL Final    Comment:                            Normal                   <150                         Borderline High     150 - 199                         High                200 - 499                         Very High                >499          Passed - Patient is not pregnant      Passed - Valid encounter within last 12 months    Recent Outpatient Visits           1 week ago Cat bite of left lower leg with infection, subsequent encounter   [place], Erin E, PA-C   3 weeks ago Cellulitis of right lower extremity   [place], Henrine Screws T, NP   2 months ago Essential hypertension   [facility] Vigg, Avanti, MD   3 months ago Essential hypertension   Medical laboratory scientific officer,  Avanti, MD   3 months ago [place] Vigg, Customer service manager, MD       Future Appointments             In 1 month Cannady, Barbaraann Faster, NP [facility]             oxybutynin (DITROPAN-XL) 10 MG 24 hr tablet [Pharmacy Med Name: OXYBUTYNIN ER '10MG'$  TABLETS] 180 tablet     Sig: TAKE 2 TABLETS(20 MG) BY MOUTH AT BEDTIME     Urology:  Bladder Agents Passed - 04/25/2022  9:43 AM      Passed - Valid encounter within last 12 months    Recent Outpatient Visits           1 week ago Cat bite of left lower leg with infection, subsequent encounter   [place], Erin E, PA-C   3 weeks ago Cellulitis of right lower extremity   [place], Henrine Screws T, NP   2 months ago Essential hypertension   [facility] Vigg, Avanti, MD   3 months ago Essential hypertension   [place] Vigg, Avanti, MD   3 months ago Hypercholesteremia   [facility] Vigg, Avanti, MD       Future Appointments             In 1 month Cannady, Barbaraann Faster, NP [facility], [facility]

## 2022-05-15 ENCOUNTER — Telehealth (INDEPENDENT_AMBULATORY_CARE_PROVIDER_SITE_OTHER): Payer: Medicare PPO | Admitting: Psychiatry

## 2022-05-15 ENCOUNTER — Encounter: Payer: Self-pay | Admitting: Psychiatry

## 2022-05-15 DIAGNOSIS — F3181 Bipolar II disorder: Secondary | ICD-10-CM

## 2022-05-15 DIAGNOSIS — F419 Anxiety disorder, unspecified: Secondary | ICD-10-CM | POA: Diagnosis not present

## 2022-05-15 DIAGNOSIS — G4700 Insomnia, unspecified: Secondary | ICD-10-CM | POA: Diagnosis not present
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
## 2022-05-21 ENCOUNTER — Other Ambulatory Visit: Payer: Self-pay | Admitting: Psychiatry

## 2022-05-25 ENCOUNTER — Other Ambulatory Visit: Payer: Self-pay | Admitting: Psychiatry

## 2022-05-25 DIAGNOSIS — F419 Anxiety disorder, unspecified: Secondary | ICD-10-CM

## 2022-05-25 DIAGNOSIS — F3175 Bipolar disorder, in partial remission, most recent episode depressed: Secondary | ICD-10-CM

## 2022-05-25 NOTE — Telephone Encounter (Signed)
She asks me not to fill medication as she has plenty of medication at home. Could you ask her if she needs these refills? Thanks.

## 2022-06-01 ENCOUNTER — Telehealth: Payer: Self-pay

## 2022-06-01 DIAGNOSIS — F419 Anxiety disorder, unspecified: Secondary | ICD-10-CM

## 2022-06-01 DIAGNOSIS — F3175 Bipolar disorder, in partial remission, most recent episode depressed: Secondary | ICD-10-CM

## 2022-06-01 MED ORDER — VENLAFAXINE HCL ER 75 MG PO CP24: 225.0000 mg | ORAL_CAPSULE | Freq: Every day | ORAL | 0 refills | Status: DC

## 2022-06-01 MED ORDER — QUETIAPINE FUMARATE 200 MG PO TABS: 200.0000 mg | ORAL_TABLET | Freq: Every day | ORAL | 0 refills | Status: DC

## 2022-06-01 NOTE — Telephone Encounter (Signed)
[facility] states that patient needs a refill on the venlafaxine and on the quetiapine. pt last seen dr. Modesta Messing on 05-15-22 and next appt 07-17-22

## 2022-06-01 NOTE — Telephone Encounter (Signed)
I have sent Seroquel and venlafaxine to pharmacy.-90 days supply.

## 2022-06-02 NOTE — Telephone Encounter (Signed)
Pt notified told pt that rxs was sent into the pharmacy for 90 day supply

## 2022-06-12 DIAGNOSIS — K219 Gastro-esophageal reflux disease without esophagitis: Secondary | ICD-10-CM | POA: Insufficient documentation

## 2022-06-12 DIAGNOSIS — M85852 Other specified disorders of bone density and structure, left thigh: Secondary | ICD-10-CM | POA: Insufficient documentation

## 2022-06-17 ENCOUNTER — Ambulatory Visit: Payer: Medicare PPO | Admitting: Nurse Practitioner

## 2022-06-17 DIAGNOSIS — K219 Gastro-esophageal reflux disease without esophagitis: Secondary | ICD-10-CM

## 2022-06-17 DIAGNOSIS — I10 Essential (primary) hypertension: Secondary | ICD-10-CM

## 2022-06-17 DIAGNOSIS — M85852 Other specified disorders of bone density and structure, left thigh: Secondary | ICD-10-CM

## 2022-06-17 DIAGNOSIS — E782 Mixed hyperlipidemia: Secondary | ICD-10-CM

## 2022-06-17 DIAGNOSIS — F3175 Bipolar disorder, in partial remission, most recent episode depressed: Secondary | ICD-10-CM

## 2022-06-17 DIAGNOSIS — N3281 Overactive bladder: Secondary | ICD-10-CM

## 2022-06-17 DIAGNOSIS — F419 Anxiety disorder, unspecified: Secondary | ICD-10-CM

## 2022-07-05 NOTE — Patient Instructions (Incomplete)
Stop Atorvastatin and start Rosuvastatin once it arrives.  Stop Fenofibrate for now.  [facility] Address: [address], [place], [place] [address] Hours:  Open ? Closes 4?PM Phone: [phone]  Managing Anxiety, Adult After being diagnosed with anxiety, you may be relieved to know why you have felt or behaved a certain way. You may also feel overwhelmed about the treatment ahead and what it will mean for your life. With care and support, you can manage this condition. How to manage lifestyle changes Managing stress and anxiety  Stress is your body's reaction to life changes and events, both good and bad. Most stress will last just a few hours, but stress can be ongoing and can lead to more than just stress. Although stress can play a major role in anxiety, it is not the same as anxiety. Stress is usually caused by something external, such as a deadline, test, or competition. Stress normally passes after the triggering event has ended.  Anxiety is caused by something internal, such as imagining a terrible outcome or worrying that something will go wrong that will devastate you. Anxiety often does not go away even after the triggering event is over, and it can become long-term (chronic) worry. It is important to understand the differences between stress and anxiety and to manage your stress effectively so that it does not lead to an anxious response. Talk with your health care provider or a counselor to learn more about reducing anxiety and stress. He or she may suggest tension reduction techniques, such as: Music therapy. Spend time creating or listening to music that you enjoy and that inspires you. Mindfulness-based meditation. Practice being aware of your normal breaths while not trying to control your breathing. It can be done while sitting or walking. Centering prayer. This involves focusing on a word, phrase, or sacred image that means something to you and brings you  peace. Deep breathing. To do this, expand your stomach and inhale slowly through your nose. Hold your breath for 3-5 seconds. Then exhale slowly, letting your stomach muscles relax. Self-talk. Learn to notice and identify thought patterns that lead to anxiety reactions and change those patterns to thoughts that feel peaceful. Muscle relaxation. Taking time to tense muscles and then relax them. Choose a tension reduction technique that fits your lifestyle and personality. These techniques take time and practice. Set aside 5-15 minutes a day to do them. Therapists can offer counseling and training in these techniques. The training to help with anxiety may be covered by some insurance plans. Other things you can do to manage stress and anxiety include: Keeping a stress diary. This can help you learn what triggers your reaction and then learn ways to manage your response. Thinking about how you react to certain situations. You may not be able to control everything, but you can control your response. Making time for activities that help you relax and not feeling guilty about spending your time in this way. Doing visual imagery. This involves imagining or creating mental pictures to help you relax. Practicing yoga. Through yoga poses, you can lower tension and promote relaxation.  Medicines Medicines can help ease symptoms. Medicines for anxiety include: Antidepressant medicines. These are usually prescribed for long-term daily control. Anti-anxiety medicines. These may be added in severe cases, especially when panic attacks occur. Medicines will be prescribed by a health care provider. When used together, medicines, psychotherapy, and tension reduction techniques may be the most effective treatment. Relationships Relationships can  play a big part in helping you recover. Try to spend more time connecting with trusted friends and family members. Consider going to couples counseling if you have a partner,  taking family education classes, or going to family therapy. Therapy can help you and others better understand your condition. How to recognize changes in your anxiety Everyone responds differently to treatment for anxiety. Recovery from anxiety happens when symptoms decrease and stop interfering with your daily activities at home or work. This may mean that you will start to: Have better concentration and focus. Worry will interfere less in your daily thinking. Sleep better. Be less irritable. Have more energy. Have improved memory. It is also important to recognize when your condition is getting worse. Contact your health care provider if your symptoms interfere with home or work and you feel like your condition is not improving. Follow these instructions at home: Activity Exercise. Adults should do the following: Exercise for at least 150 minutes each week. The exercise should increase your heart rate and make you sweat (moderate-intensity exercise). Strengthening exercises at least twice a week. Get the right amount and quality of sleep. Most adults need 7-9 hours of sleep each night. Lifestyle  Eat a healthy diet that includes plenty of vegetables, fruits, whole grains, low-fat dairy products, and lean protein. Do not eat a lot of foods that are high in fats, added sugars, or salt (sodium). Make choices that simplify your life. Do not use any products that contain nicotine or tobacco. These products include cigarettes, chewing tobacco, and vaping devices, such as e-cigarettes. If you need help quitting, ask your health care provider. Avoid caffeine, alcohol, and certain over-the-counter cold medicines. These may make you feel worse. Ask your pharmacist which medicines to avoid. General instructions Take over-the-counter and prescription medicines only as told by your health care provider. Keep all follow-up visits. This is important. Where to find support You can get help and support  from these sources: Self-help groups. Online and [employer]. A trusted spiritual leader. Couples counseling. Family education classes. Family therapy. Where to find more information You may find that joining a support group helps you deal with your anxiety. The following sources can help you locate counselors or support groups near you: Mental Health America: www.mentalhealthamerica.net Anxiety and Depression Association of [place] (ADAA): ProgramCam.de The [employer] on Mental Illness (NAMI): www.nami.org Contact a health care provider if: You have a hard time staying focused or finishing daily tasks. You spend many hours a day feeling worried about everyday life. You become exhausted by worry. You start to have headaches or frequently feel tense. You develop chronic nausea or diarrhea. Get help right away if: You have a racing heart and shortness of breath. You have thoughts of hurting yourself or others. If you ever feel like you may hurt yourself or others, or have thoughts about taking your own life, get help right away. Go to your nearest emergency department or: Call your local emergency services (911 in the [place] Call a suicide crisis helpline, such as the National Suicide Prevention Lifeline at 413-369-2478 or 988 in the [place] This is open 24 hours a day in the [place]. Text the Crisis Text Line at (669)808-4857 (in the U.S.). Summary Taking steps to learn and use tension reduction techniques can help calm you and help prevent triggering an anxiety reaction. When used together, medicines, psychotherapy, and tension reduction techniques may be the most effective treatment. Family, friends, and partners can play a big part in supporting  you. This information is not intended to replace advice given to you by your health care provider. Make sure you discuss any questions you have with your health care provider. Document Revised: 03/26/2021 Document Reviewed:  12/22/2020 Elsevier Patient Education  2023 [employer].

## 2022-07-07 ENCOUNTER — Telehealth: Payer: Medicare PPO

## 2022-07-07 ENCOUNTER — Encounter: Payer: Self-pay | Admitting: Nurse Practitioner

## 2022-07-07 ENCOUNTER — Telehealth: Payer: Self-pay

## 2022-07-07 ENCOUNTER — Ambulatory Visit: Payer: Medicare PPO | Admitting: Nurse Practitioner

## 2022-07-07 ENCOUNTER — Ambulatory Visit (INDEPENDENT_AMBULATORY_CARE_PROVIDER_SITE_OTHER): Payer: Medicare PPO

## 2022-07-07 VITALS — BP 138/80 | HR 78 | Temp 98.1°F | Ht 65.3 in | Wt 202.3 lb

## 2022-07-07 DIAGNOSIS — I10 Essential (primary) hypertension: Secondary | ICD-10-CM

## 2022-07-07 DIAGNOSIS — F3175 Bipolar disorder, in partial remission, most recent episode depressed: Secondary | ICD-10-CM

## 2022-07-07 DIAGNOSIS — N3281 Overactive bladder: Secondary | ICD-10-CM

## 2022-07-07 DIAGNOSIS — F419 Anxiety disorder, unspecified: Secondary | ICD-10-CM

## 2022-07-07 DIAGNOSIS — K219 Gastro-esophageal reflux disease without esophagitis: Secondary | ICD-10-CM

## 2022-07-07 DIAGNOSIS — E782 Mixed hyperlipidemia: Secondary | ICD-10-CM

## 2022-07-07 DIAGNOSIS — M85852 Other specified disorders of bone density and structure, left thigh: Secondary | ICD-10-CM

## 2022-07-07 DIAGNOSIS — Z6836 Body mass index (BMI) 36.0-36.9, adult: Secondary | ICD-10-CM

## 2022-07-07 DIAGNOSIS — Z87898 Personal history of other specified conditions: Secondary | ICD-10-CM | POA: Insufficient documentation

## 2022-07-07 LAB — MICROALBUMIN, URINE WAIVED
Creatinine, Urine Waived: 100 mg/dL (ref 10–300)
Microalb, Ur Waived: 30 mg/L — ABNORMAL HIGH (ref 0–19)
Microalb/Creat Ratio: <30 mg/g (ref <30)

## 2022-07-07 MED ORDER — METOPROLOL SUCCINATE ER 50 MG PO TB24: 50.0000 mg | ORAL_TABLET | Freq: Every day | ORAL | 4 refills | Status: DC

## 2022-07-07 MED ORDER — ROSUVASTATIN CALCIUM 40 MG PO TABS: 40.0000 mg | ORAL_TABLET | Freq: Every day | ORAL | 4 refills | Status: DC

## 2022-07-07 MED ORDER — BENAZEPRIL HCL 40 MG PO TABS: 40.0000 mg | ORAL_TABLET | Freq: Every day | ORAL | 4 refills | Status: DC

## 2022-07-07 MED ORDER — MONTELUKAST SODIUM 10 MG PO TABS: ORAL_TABLET | ORAL | 4 refills | Status: DC

## 2022-07-07 MED ORDER — OMEPRAZOLE 40 MG PO CPDR: 40.0000 mg | DELAYED_RELEASE_CAPSULE | Freq: Every day | ORAL | 4 refills | Status: DC

## 2022-07-07 MED ORDER — FEXOFENADINE HCL 180 MG PO TABS: 180.0000 mg | ORAL_TABLET | Freq: Every day | ORAL | 4 refills | Status: DC

## 2022-07-07 NOTE — Chronic Care Management (AMB) (Signed)
  Chronic Care Management   Note  07/07/2022 Name: Elizabeth Malone MRN: [id number] DOB: 19-Apr-1951  Elizabeth Malone is a 71 y.o. year old female who is a primary care patient of Cannady, Barbaraann Faster, NP. I reached out to Elizabeth Malone by phone today in response to a referral sent by Ms. [place] PCP.  Ms. Baade was given information about Chronic Care Management services today including:  CCM service includes personalized support from designated clinical staff supervised by her physician, including individualized plan of care and coordination with other care providers 24/7 contact phone numbers for assistance for urgent and routine care needs. Service will only be billed when office clinical staff spend 20 minutes or more in a month to coordinate care. Only one practitioner may furnish and bill the service in a calendar month. The patient may stop CCM services at any time (effective at the end of the month) by phone call to the office staff. The patient is responsible for co-pay (up to 20% after annual deductible is met) if co-pay is required by the individual health plan.   Patient agreed to services and verbal consent obtained.   Follow up plan: Telephone appointment with care management team member scheduled for:07/07/2022  Noreene Larsson, [place], [place] [address] Direct Dial: [phone] [email or web address]
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
## 2022-07-07 NOTE — Assessment & Plan Note (Signed)
BMI 33.36.  Recommended eating smaller high protein, low fat meals more frequently and exercising 30 mins a day 5 times a week with a goal of 10-15lb weight loss in the next 3 months. Patient voiced their understanding and motivation to adhere to these recommendations.  

## 2022-07-07 NOTE — Chronic Care Management (AMB) (Signed)
  Care Coordination   Note   07/07/2022 Name: JAID QUIRION MRN: [id number] DOB: [birth year]  [name] is a 71 y.o. year old female who sees [place], Henrine Screws T, NP for primary care. I reached out to [name] by phone today to offer care coordination services.  Ms. Yohe was given information about Care Coordination services today including:   The Care Coordination services include support from the care team which includes your Nurse Coordinator, Clinical Social Worker, or Pharmacist.  The Care Coordination team is here to help remove barriers to the health concerns and goals most important to you. Care Coordination services are voluntary, and the patient may decline or stop services at any time by request to their care team member.   Care Coordination Consent Status: Patient agreed to services and verbal consent obtained.   Follow up plan:  Telephone appointment with care coordination team member scheduled for:  LCSW 07/09/2022  Encounter Outcome:  Pt. Scheduled  Noreene Larsson, [place], [place] [address] Direct Dial: [phone] [email or web address]
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
## 2022-07-07 NOTE — Assessment & Plan Note (Signed)
Chronic, ongoing.  Continue current medication regimen and adjust as needed.  Refills sent in and labs today.

## 2022-07-07 NOTE — Assessment & Plan Note (Signed)
Chronic, ongoing.  Continue current medication regimen at this time.  Risks of PPI use were discussed with patient including bone loss, C. Diff diarrhea, pneumonia, infections, CKD, electrolyte abnormalities. Verbalizes understanding and chooses to continue the medication.
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
## 2022-07-07 NOTE — Assessment & Plan Note (Signed)
Refer to Bipolar plan of care. 

## 2022-07-07 NOTE — Assessment & Plan Note (Signed)
Chronic, ongoing with initial BP elevated due to anxiety, but repeat at goal for age.  Recommend she monitor BP at least a few mornings a week at home and document.  DASH diet at home.  Continue current medication regimen and adjust as needed.  Labs today: CMP and urine ALB.  Refills sent in.

## 2022-07-07 NOTE — Chronic Care Management (AMB) (Signed)
  Care Coordination  Outreach Note  07/07/2022 Name: ADIN LARICCIA MRN: [id number] DOB: 02-23-1951   Care Coordination Outreach Attempts: An unsuccessful telephone outreach was attempted today to offer the patient information about available care coordination services as a benefit of their health plan.   Follow Up Plan:  Additional outreach attempts will be made to offer the patient care coordination information and services.   Encounter Outcome:  No Answer  Sig Noreene Larsson, [place], [place] [address] Direct Dial: [phone] [email or web address]
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
## 2022-07-07 NOTE — Assessment & Plan Note (Signed)
Chronic, ongoing, noted on DEXA 08/27/2019.  Recommend continue daily Calcium and Vitamin D supplement.  Weight bearing exercises at home.  Check Vit D today.

## 2022-07-08 LAB — COMPREHENSIVE METABOLIC PANEL
ALT: 136 IU/L — ABNORMAL HIGH (ref 0–32)
AST: 40 IU/L (ref 0–40)
Albumin/Globulin Ratio: 1.8 (ref 1.2–2.2)
Albumin: 4.7 g/dL (ref 3.9–4.9)
Alkaline Phosphatase: 170 IU/L — ABNORMAL HIGH (ref 44–121)
BUN/Creatinine Ratio: 13 (ref 12–28)
BUN: 13 mg/dL (ref 8–27)
Bilirubin Total: 0.5 mg/dL (ref 0.0–1.2)
CO2: 24 mmol/L (ref 20–29)
Calcium: 10 mg/dL (ref 8.7–10.3)
Chloride: 101 mmol/L (ref 96–106)
Creatinine, Ser: 0.97 mg/dL (ref 0.57–1.00)
Globulin, Total: 2.6 g/dL (ref 1.5–4.5)
Glucose: 112 mg/dL — ABNORMAL HIGH (ref 70–99)
Potassium: 4.5 mmol/L (ref 3.5–5.2)
Sodium: 140 mmol/L (ref 134–144)
Total Protein: 7.3 g/dL (ref 6.0–8.5)
eGFR: 63 mL/min/{1.73_m2} (ref >59)

## 2022-07-08 LAB — LIPID PANEL W/O CHOL/HDL RATIO
Cholesterol, Total: 232 mg/dL — ABNORMAL HIGH (ref 100–199)
HDL: 62 mg/dL (ref >39)
LDL Chol Calc (NIH): 128 mg/dL — ABNORMAL HIGH (ref 0–99)
Triglycerides: 238 mg/dL — ABNORMAL HIGH (ref 0–149)
VLDL Cholesterol Cal: 42 mg/dL — ABNORMAL HIGH (ref 5–40)

## 2022-07-08 LAB — MAGNESIUM: Magnesium: 1.9 mg/dL (ref 1.6–2.3)

## 2022-07-08 LAB — VITAMIN D 25 HYDROXY (VIT D DEFICIENCY, FRACTURES): Vit D, 25-Hydroxy: 21.7 ng/mL — ABNORMAL LOW (ref 30.0–100.0)

## 2022-07-08 NOTE — Progress Notes (Signed)
Contacted via MyChart  Good afternoon Elizabeth Malone, your labs have returned: - Definitely change to the Rosuvastatin as we discussed and stop Atorvastatin as cholesterol levels remain elevated.  Ensure to take Rosuvastatin daily. - Vitamin D level low, please take over the counter Vitamin D3 2000 units daily for bone health.  Magnesium level normal. - Kidney function, creatinine and eGFR, remains normal.  Liver function, ALT, is elevated, but AST normal + alkaline phosphatase elevated.  We will repeat next visit and if ongoing then get some imaging.  Avoid alcohol use or Tylenol at home.  Any questions? Keep being amazing!!  Thank you for allowing me to participate in your care.  I appreciate you. Kindest regards, Wilkie Zenon

## 2022-07-09 ENCOUNTER — Ambulatory Visit: Payer: Self-pay | Admitting: *Deleted

## 2022-07-09 ENCOUNTER — Telehealth: Payer: Self-pay

## 2022-07-09 ENCOUNTER — Other Ambulatory Visit: Payer: Self-pay | Admitting: Psychiatry
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
## 2022-07-09 NOTE — Telephone Encounter (Signed)
Ok, she can increase to 300 mg at night for now. However, please advise her not to self adjust the medication to avoid any serious adverse reaction.

## 2022-07-09 NOTE — Telephone Encounter (Signed)
Please advise her to let's first try increase quetiapine 25 mg ( take in addition to 200 mg she takes at night.) Could you ask her which pharmacy she uses? Thanks.
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
## 2022-07-10 NOTE — Telephone Encounter (Signed)
Pt.notified
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
## 2022-07-14 DIAGNOSIS — I10 Essential (primary) hypertension: Secondary | ICD-10-CM

## 2022-07-14 DIAGNOSIS — F319 Bipolar disorder, unspecified: Secondary | ICD-10-CM

## 2022-07-16 NOTE — Progress Notes (Signed)
Virtual Visit via Video Note  I connected with Elizabeth Malone on 07/17/22 at 10:00 AM EDT by a video enabled telemedicine application and verified that I am speaking with the correct person using two identifiers.  Location: Patient: outside Provider: office Persons participated in the visit- patient, provider    I discussed the limitations of evaluation and management by telemedicine and the availability of in person appointments. The patient expressed understanding and agreed to proceed.  I discussed the assessment and treatment plan with the patient. The patient was provided an opportunity to ask questions and all were answered. The patient agreed with the plan and demonstrated an understanding of the instructions.   The patient was advised to call back or seek an in-person evaluation if the symptoms worsen or if the condition fails to improve as anticipated.  I provided 21 minutes of non-face-to-face time during this encounter.   [name], MD    [facility] MD/PA/NP OP Progress Note  07/17/2022 10:39 AM Elizabeth Malone  MRN:  [id number]  Chief Complaint:  Chief Complaint  Patient presents with   Follow-up   Other   HPI:  - increased quetiapine to 300 mg at night since the last visit This is a follow-up appointment for depression, anxiety.  She states that she has been doing much better for the past week.  Her husband has been polite since he quit alcohol.  Although everybody recommends her to leave him, she does not think it is not an option financially.  However, she is willing to get resources for shelters. She has not heard back from her Education officer, museum, and has agreed that this Probation officer sends a reminder to her. She states that she was not functioning a few weeks ago.  Although she has slight increase in appetite and speak loud while being asleep since uptitration of quetiapine, she wants to stay on the same dose of quetiapine as there are many things she needs to.  She states that she  will make presentation about Revolutionary war, followed by a dinner. She will join veterans parade.  She has an upcoming appointments.  She states that she knows her body as she has seen psychiatrists and tried quetiapine for many years.  She sleeps better.  She denies SI.  She wants to stay on the medication as it is for now.   Wt Readings from Last 3 Encounters:  07/07/22 202 lb 4.8 oz (91.8 kg)  04/16/22 196 lb (88.9 kg)  04/06/22 196 lb 14.4 oz (89.3 kg)     Daily routine: sit on the porch with her husband Exercise: Employment: retired in 2008, used to work as Administrator Support: Household: husband Marital status: married for 50 years in 2022 Number of children: 2 (1 son and 1 daughter in [place]) She grew up in [place].  She reports lack of nurturing as a child.  Her father died from MI when young. Her mother was driving with her girlfriend, being around with married man while [place] was sitting in the back of the car. Had good relationship with her grandmother  Visit Diagnosis:    ICD-10-CM   1. Bipolar II disorder ([place])  F31.81     2. Anxiety  F41.9     3. Insomnia, unspecified type  G47.00       Past Psychiatric History: Please see initial evaluation for full details. I have reviewed the history. No updates at this time.     Past Medical History:  Past Medical  History:  Diagnosis Date   Anxiety    Bipolar disorder (HCC)    CKD (chronic kidney disease) stage 3, GFR 30-59 ml/min (HCC)    Complication of anesthesia    Depression    GERD (gastroesophageal reflux disease)    occasionally has to use tums   Headache    History of diverticulosis    Hyperlipidemia    Macular degeneration    Overactive bladder    PONV (postoperative nausea and vomiting)     Past Surgical History:  Procedure Laterality Date   COLONOSCOPY WITH PROPOFOL N/A 10/07/2017   Procedure: COLONOSCOPY WITH PROPOFOL;  Surgeon: [name], MD;  Location: [facility];   Service: Endoscopy;  Laterality: N/A;   FOOT SURGERY Bilateral    KNEE SURGERY Left    POLYPECTOMY  10/07/2017   Procedure: POLYPECTOMY;  Surgeon: [name], MD;  Location: [facility];  Service: Endoscopy;;   TONSILLECTOMY AND ADENOIDECTOMY      Family Psychiatric History: Please see initial evaluation for full details. I have reviewed the history. No updates at this time.     Family History:  Family History  Problem Relation Age of Onset   Alzheimer's disease Mother    Bipolar disorder Mother    Depression Mother    Heart attack Father    Diabetes Brother    Obesity Brother    Other Brother        Programmer, multimedia Myelitis   Depression Daughter    Stroke Maternal Grandmother    Depression Maternal Grandfather    Stroke Paternal Grandmother    Heart attack Paternal Grandfather    Breast cancer Neg Hx     Social History:  Social History   Socioeconomic History   Marital status: Married    Spouse name: Not on file   Number of children: 2   Years of education: Not on file   Highest education level: Bachelor's degree (e.g., BA, AB, BS)  Occupational History   Occupation: retired   Tobacco Use   Smoking status: Former    Types: Cigarettes    Start date: 07/09/1967    Quit date: 07/08/1985    Years since quitting: 37.0   Smokeless tobacco: Never  Vaping Use   Vaping Use: Never used  Substance and Sexual Activity   Alcohol use: Yes    Alcohol/week: 3.0 standard drinks of alcohol    Types: 3 Shots of liquor per week    Comment: occassional   Drug use: No   Sexual activity: Not Currently  Other Topics Concern   Not on file  Social History Narrative   Not on file   Social Determinants of Health   Financial Resource Strain: Low Risk  (07/07/2022)   Overall Financial Resource Strain (CARDIA)    Difficulty of Paying Living Expenses: Not very hard  Food Insecurity: No Food Insecurity (07/07/2022)   Hunger Vital Sign    Worried About Running Out of Food in  the Last Year: Never true    Ran Out of Food in the Last Year: Never true  Transportation Needs: No Transportation Needs (07/07/2022)   PRAPARE - Hydrologist (Medical): No    Lack of Transportation (Non-Medical): No  Physical Activity: Inactive (07/07/2022)   Exercise Vital Sign    Days of Exercise per Week: 0 days    Minutes of Exercise per Session: 0 min  Stress: Stress Concern Present (07/07/2022)   [place]  Questionnaire    Feeling of Stress : To some extent  Social Connections: Socially Integrated (07/07/2022)   Social Connection and Isolation Panel [NHANES]    Frequency of Communication with Friends and Family: More than three times a week    Frequency of Social Gatherings with Friends and Family: More than three times a week    Attends Religious Services: More than 4 times per year    Active Member of [employer] or Organizations: Yes    Attends Music therapist: More than 4 times per year    Marital Status: Married    Allergies:  Allergies  Allergen Reactions   Fluoxetine Other (See Comments)   Hydrocodone-Chlorpheniramine Other (See Comments)   Paroxetine Hcl Other (See Comments)   Penicillin G Benzathine Other (See Comments)   Penicillin V Potassium Other (See Comments)   Clindamycin Hcl Rash and Other (See Comments)    Katherina Right' syndrome   Lamotrigine Rash    Metabolic Disorder Labs: Lab Results  Component Value Date   HGBA1C 5.3 12/22/2021   No results found for: "PROLACTIN" Lab Results  Component Value Date   CHOL 232 (H) 07/07/2022   TRIG 238 (H) 07/07/2022   HDL 62 07/07/2022   CHOLHDL 3.3 12/22/2021   VLDL 39 (H) 08/17/2017   LDLCALC 128 (H) 07/07/2022   LDLCALC 113 (H) 12/22/2021   Lab Results  Component Value Date   TSH 1.100 12/22/2021   TSH 1.580 10/24/2021    Therapeutic Level Labs: Lab Results  Component Value Date   LITHIUM 0.66 05/07/2021    LITHIUM 0.72 04/22/2020   No results found for: "VALPROATE" No results found for: "CBMZ"  Current Medications: Current Outpatient Medications  Medication Sig Dispense Refill   benazepril (LOTENSIN) 40 MG tablet Take 1 tablet (40 mg total) by mouth daily. 90 tablet 4   busPIRone (BUSPAR) 15 MG tablet Take 15 mg by mouth 3 (three) times daily.     Calcium Citrate-Vitamin D (CALCIUM + D PO) Take 1 tablet by mouth 2 (two) times daily.     fexofenadine (ALLEGRA) 180 MG tablet Take 1 tablet (180 mg total) by mouth daily. 90 tablet 4   fluticasone (FLONASE) 50 MCG/ACT nasal spray SHAKE LIQUID AND USE 2 SPRAYS IN EACH NOSTRIL DAILY 16 g 7   metoprolol succinate (TOPROL-XL) 50 MG 24 hr tablet Take 1 tablet (50 mg total) by mouth daily. 90 tablet 4   montelukast (SINGULAIR) 10 MG tablet TAKE 1 TABLET(10 MG) BY MOUTH AT BEDTIME 90 tablet 4   Multiple Vitamin (MULTIVITAMIN) tablet Take 1 tablet by mouth daily.     mupirocin ointment (BACTROBAN) 2 % Apply 1 Application topically 2 (two) times daily. 22 g 0   Omega-3 Fatty Acids (FISH OIL) 1000 MG CAPS Take 1,000 mg by mouth 2 (two) times daily.     omeprazole (PRILOSEC) 40 MG capsule Take 1 capsule (40 mg total) by mouth daily. 90 capsule 4   oxybutynin (DITROPAN-XL) 10 MG 24 hr tablet TAKE 2 TABLETS(20 MG) BY MOUTH AT BEDTIME 180 tablet 4   QUEtiapine (SEROQUEL) 200 MG tablet Take 1 tablet (200 mg total) by mouth at bedtime. 90 tablet 0   rosuvastatin (CRESTOR) 40 MG tablet Take 1 tablet (40 mg total) by mouth daily. Stop Atorvastatin and start this medication. 90 tablet 4   venlafaxine XR (EFFEXOR XR) 75 MG 24 hr capsule Take 3 capsules (225 mg total) by mouth daily. 270 capsule 0   No current facility-administered medications  for this visit.     Musculoskeletal: Strength & Muscle Tone:  N/.A Gait & Station:  N/A Patient leans: N/A  Psychiatric Specialty Exam: Review of Systems  Psychiatric/Behavioral:  Positive for decreased  concentration, dysphoric mood and sleep disturbance. Negative for agitation, behavioral problems, confusion, hallucinations, self-injury and suicidal ideas. The patient is nervous/anxious. The patient is not hyperactive.   All other systems reviewed and are negative.   There were no vitals taken for this visit.There is no height or weight on file to calculate BMI.  General Appearance: Fairly Groomed  Eye Contact:  Good  Speech:  Clear and Coherent  Volume:  Normal  Mood:   better  Affect:  Appropriate, Congruent, and calmer  Thought Process:  Coherent  Orientation:  Full (Time, Place, and Person)  Thought Content: Logical   Suicidal Thoughts:  No  Homicidal Thoughts:  No  Memory:  Immediate;   Good  Judgement:  Good  Insight:  Good  Psychomotor Activity:  Normal  Concentration:  Concentration: Good and Attention Span: Good  Recall:  Good  Fund of Knowledge: Good  Language: Good  Akathisia:  No  Handed:  Right  AIMS (if indicated): not done  Assets:  Communication Skills Desire for Improvement  ADL's:  Intact  Cognition: WNL  Sleep:  Fair   Screenings: GAD-7    Flowsheet Row Office Visit from 07/07/2022 in [facility] Visit from 04/06/2022 in [facility] Visit from 01/15/2022 in [facility] [place] Visit from 12/29/2021 in [facility] Visit from 09/23/2021 in [facility]  Total GAD-7 Score '18 3 4 3 1      '$ PHQ2-9    [facility] Office Visit from 07/07/2022 in [facility] Visit from 04/06/2022 in [facility] Visit from 01/15/2022 in [facility] Visit from 12/29/2021 in [facility] Video Visit from 10/24/2021 in [facility]  PHQ-2 Total Score 1 1 0 0 0  PHQ-9 Total Score '14 2 3 4 '$ --      Flowsheet Row Video Visit from 05/06/2021 in [facility] Video Visit from 03/25/2021  in [place] No Risk No Risk        Assessment and Plan:  Elizabeth Malone is a 71 y.o. year old female with a history of bipolar II disorder, history of stage III CKD (resolved after ACI was discontinued per patient), hypertension, GERD, PONV, hyperlipidemia , who presents for follow up appointment for below.    1. Bipolar II disorder ([place]) 2. Anxiety 3. Insomnia, unspecified type , She had significant worsening in mood in the context of conflict with her husband with alcohol use, it has been improving over the past week since her husband being abstinent from alcohol, which also coincided with uptitration of quetiapine. Other psychosocial stressors includes his brother, who was diagnosed with transverse myelitis.  Will continue current dose of quetiapine; she was advised to lower the dose if any concerns about nighttime behavior.  Will continue venlafaxine to target depression and anxiety.  Will continue BuSpar for anxiety. Noted that although she was diagnosed with bipolar 1 disorder by other provider, she had only hypomanic symptoms, and mainly struggled with depression. Will continue to monitor.  She will greatly benefit from CBT.  She has not been able to see her therapist due to therapist's medical condition.  She is willing to try a therapist in the  office.  Will make referral.   Plan (She requested that no refills to be given as she has piles of medication. She will contact the office if she needs any refills.) Continue venlafaxine 225 mg daily Continue quetiapine 300 mg at night (uptitrated from 200 mg) Continue Buspar 15 mg three times a day Next appointment- 12/8 at 10:30 , video Referral to therapy  Sent message to Ms. Land, [place], [place] regarding the appointment today to ensure providing available resources to her with the patient consent. - she used to see sheryl lawson, therapist for many years  Past trials of medication:  Abilify, lithium (xerostomia), gabapentin      The patient demonstrates the following risk factors for suicide: Chronic risk factors for suicide include: psychiatric disorder of bipolar disorder and previous suicide attempts of cutting, putting cord around her neck, overdose on medication . Acute risk factors for suicide include: unemployment. Protective factors for this patient include: positive social support, coping skills, and hope for the future. Considering these factors, the overall suicide risk at this point appears to be low. Patient is appropriate for outpatient follow up.       Collaboration of Care: Collaboration of Care: Other reviewed notes in Epic  Patient/Guardian was advised Release of Information must be obtained prior to any record release in order to collaborate their care with an outside provider. Patient/Guardian was advised if they have not already done so to contact the registration department to sign all necessary forms in order for [place] to release information regarding their care.   Consent: Patient/Guardian gives verbal consent for treatment and assignment of benefits for services provided during this visit. Patient/Guardian expressed understanding and agreed to proceed.    [name], MD 07/17/2022, 10:39 AM

## 2022-07-17 ENCOUNTER — Encounter: Payer: Self-pay | Admitting: Psychiatry

## 2022-07-17 ENCOUNTER — Ambulatory Visit: Payer: Self-pay | Admitting: *Deleted

## 2022-07-17 ENCOUNTER — Telehealth (INDEPENDENT_AMBULATORY_CARE_PROVIDER_SITE_OTHER): Payer: Medicare PPO | Admitting: Psychiatry

## 2022-07-17 DIAGNOSIS — G4700 Insomnia, unspecified: Secondary | ICD-10-CM | POA: Diagnosis not present

## 2022-07-17 DIAGNOSIS — F419 Anxiety disorder, unspecified: Secondary | ICD-10-CM | POA: Diagnosis not present

## 2022-07-17 DIAGNOSIS — F3181 Bipolar II disorder: Secondary | ICD-10-CM | POA: Diagnosis not present

## 2022-07-17 NOTE — Patient Instructions (Signed)
Continue venlafaxine 225 mg daily Continue quetiapine 300 mg at night Continue Buspar 15 mg three times a day Next appointment- 12/8 at 10:30
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
## 2022-07-20 NOTE — Patient Outreach (Deleted)
{  CARE COORDINATION NOTES:27886} 

## 2022-08-04 ENCOUNTER — Telehealth: Payer: Self-pay

## 2022-08-04 ENCOUNTER — Telehealth: Payer: Medicare PPO

## 2022-08-04 ENCOUNTER — Ambulatory Visit: Payer: Self-pay

## 2022-08-04 NOTE — Telephone Encounter (Signed)
Pt returned our call.   Note from OV of 07/07/2022:  Stop Atorvastatin and start Rosuvastatin once it arrives.  Stop Fenofibrate for now.     Shared with pt. Reason for Disposition . Caller has medicine question only, adult not sick, AND triager answers question  Answer Assessment - Initial Assessment Questions 1. NAME of MEDICINE: "What medicine(s) are you calling about?"     Which medications should she be taking. 2. QUESTION: "What is your question?" (e.g., double dose of medicine, side effect)      3. PRESCRIBER: "Who prescribed the medicine?" Reason: if prescribed by specialist, call should be referred to that group.      4. SYMPTOMS: "Do you have any symptoms?" If Yes, ask: "What symptoms are you having?"  "How bad are the symptoms (e.g., mild, moderate, severe)      5. PREGNANCY:  "Is there any chance that you are pregnant?" "When was your last menstrual period?"  Protocols used: Medication Question Call-A-AH

## 2022-08-04 NOTE — Telephone Encounter (Signed)
Message from [name] sent at 08/04/2022 10:28 AM EST  Summary: Medication Managment   Patient unsure if she should be taking FENOFIBRATE '145MG'$ , patient states Dr. Neomia Dear prescribed and since then her cholesterol medication has changed.        Called pt and LM on VM to call back. Per chart review, Fenofibrate was dc' d 07/07/22 and rosuvastatin was ordered.

## 2022-08-04 NOTE — Telephone Encounter (Signed)
   CCM RN Visit Note   08-04-2022 Name: Elizabeth Malone MRN: [id number]      DOB: 15-Jun-1951  Subjective: Elizabeth Malone is a 71 y.o. year old female who is a primary care patient of '@PCP'$ . The patient was referred to the Chronic Care Management team for assistance with care management needs subsequent to provider initiation of CCM services and plan of care.      An unsuccessful telephone outreach was attempted today to contact the patient about Chronic Care Management needs.    Plan:A HIPAA compliant phone message was left for the patient providing contact information and requesting a return call.  Noreene Larsson RN, MSN, CCM RN Care Manager  Chronic Care Management Direct Number: [phone]

## 2022-08-04 NOTE — Telephone Encounter (Signed)
Three attempts to reach pt, left VM each attempt to call back. Routing to provider for resolution per protocol.

## 2022-08-05 NOTE — Telephone Encounter (Signed)
Called patient to discuss lab results.  No answer. HIPAA compliant vm left requesting return call.  Ok for nurse triage to give results if patient calls back.    

## 2022-08-11 ENCOUNTER — Ambulatory Visit: Payer: Medicare PPO | Admitting: Licensed Clinical Social Worker

## 2022-08-20 NOTE — Progress Notes (Signed)
Virtual Visit via Video Note  I connected with Elizabeth Malone on 08/21/22 at 10:30 AM EST by a video enabled telemedicine application and verified that I am speaking with the correct person using two identifiers.  Location: Patient: home Provider: office Persons participated in the visit- patient, provider    I discussed the limitations of evaluation and management by telemedicine and the availability of in person appointments. The patient expressed understanding and agreed to proceed.    I discussed the assessment and treatment plan with the patient. The patient was provided an opportunity to ask questions and all were answered. The patient agreed with the plan and demonstrated an understanding of the instructions.   The patient was advised to call back or seek an in-person evaluation if the symptoms worsen or if the condition fails to improve as anticipated.  I provided 15 minutes of non-face-to-face time during this encounter.   [name], MD    [facility] MD/PA/NP OP Progress Note  08/21/2022 11:10 AM Elizabeth Malone  MRN:  [id number]  Chief Complaint:  Chief Complaint  Patient presents with   Follow-up   HPI:  This is a follow-up appointment for bipolar 2 disorder and insomnia.  She states that she has been doing better.  She was at her son's place during Thanksgiving week.  She enjoyed interaction with her grand daughter.  Her husband has been good since she has been back.  Although she was feeling down due to the interaction with her husband prior to leaving, she thinks it has been getting better.  She will have orthopedic surgery.  Her husband has agreed to take care of her.  She has her friends in [place], and her son in [place] if she were to need any help.  She is concerned about financial strain.  She reports insomnia.  She feels scared to go to sleep.  She agrees that it may be related to her husband.  She feels good about weight loss.  She denies SI.  She denies  alcohol use or drug use.  Her therapist will be able to continue to see her, and she will continue therapy with her at this time.  She feels comfortable to stay at the current medication regimen at this time.    193 lbs Wt Readings from Last 3 Encounters:  07/07/22 202 lb 4.8 oz (91.8 kg)  04/16/22 196 lb (88.9 kg)  04/06/22 196 lb 14.4 oz (89.3 kg)     Visit Diagnosis:    ICD-10-CM   1. Bipolar II disorder ([place])  F31.81 EKG 12-Lead    2. Anxiety  F41.9     3. Insomnia, unspecified type  G47.00       Past Psychiatric History: Please see initial evaluation for full details. I have reviewed the history. No updates at this time.     Past Medical History:  Past Medical History:  Diagnosis Date   Anxiety    Bipolar disorder ([place])    CKD (chronic kidney disease) stage 3, GFR 30-59 ml/min (HCC)    Complication of anesthesia    Depression    GERD (gastroesophageal reflux disease)    occasionally has to use tums   Headache    History of diverticulosis    Hyperlipidemia    Macular degeneration    Overactive bladder    PONV (postoperative nausea and vomiting)     Past Surgical History:  Procedure Laterality Date   COLONOSCOPY WITH PROPOFOL N/A 10/07/2017   Procedure: COLONOSCOPY  WITH PROPOFOL;  Surgeon: [name], MD;  Location: [place];  Service: Endoscopy;  Laterality: N/A;   FOOT SURGERY Bilateral    KNEE SURGERY Left    POLYPECTOMY  10/07/2017   Procedure: POLYPECTOMY;  Surgeon: [name], MD;  Location: [place];  Service: Endoscopy;;   TONSILLECTOMY AND ADENOIDECTOMY      Family Psychiatric History: Please see initial evaluation for full details. I have reviewed the history. No updates at this time.    Family History:  Family History  Problem Relation Age of Onset   Alzheimer's disease Mother    Bipolar disorder Mother    Depression Mother    Heart attack Father    Diabetes Brother    Obesity Brother    Other Brother         Programmer, multimedia Myelitis   Depression Daughter    Stroke Maternal Grandmother    Depression Maternal Grandfather    Stroke Paternal Grandmother    Heart attack Paternal Grandfather    Breast cancer Neg Hx     Social History:  Social History   Socioeconomic History   Marital status: Married    Spouse name: Not on file   Number of children: 2   Years of education: Not on file   Highest education level: Bachelor's degree (e.g., BA, AB, BS)  Occupational History   Occupation: retired   Tobacco Use   Smoking status: Former    Types: Cigarettes    Start date: 07/09/1967    Quit date: 07/08/1985    Years since quitting: 37.1   Smokeless tobacco: Never  Vaping Use   Vaping Use: Never used  Substance and Sexual Activity   Alcohol use: Yes    Alcohol/week: 3.0 standard drinks of alcohol    Types: 3 Shots of liquor per week    Comment: occassional   Drug use: No   Sexual activity: Not Currently  Other Topics Concern   Not on file  Social History Narrative   Not on file   Social Determinants of Health   Financial Resource Strain: Low Risk  (07/07/2022)   Overall Financial Resource Strain (CARDIA)    Difficulty of Paying Living Expenses: Not very hard  Food Insecurity: No Food Insecurity (07/07/2022)   Hunger Vital Sign    Worried About Running Out of Food in the Last Year: Never true    Ran Out of Food in the Last Year: Never true  Transportation Needs: No Transportation Needs (07/07/2022)   PRAPARE - Hydrologist (Medical): No    Lack of Transportation (Non-Medical): No  Physical Activity: Inactive (07/07/2022)   Exercise Vital Sign    Days of Exercise per Week: 0 days    Minutes of Exercise per Session: 0 min  Stress: Stress Concern Present (07/07/2022)   [place]    Feeling of Stress : To some extent  Social Connections: Socially Integrated (07/07/2022)   Social Connection  and Isolation Panel [NHANES]    Frequency of Communication with Friends and Family: More than three times a week    Frequency of Social Gatherings with Friends and Family: More than three times a week    Attends Religious Services: More than 4 times per year    Active Member of [employer] or Organizations: Yes    Attends Music therapist: More than 4 times per year    Marital Status: Married    Allergies:  Allergies  Allergen Reactions   Fluoxetine Other (See Comments)   Hydrocodone-Chlorpheniramine Other (See Comments)   Paroxetine Hcl Other (See Comments)   Penicillin G Benzathine Other (See Comments)   Penicillin V Potassium Other (See Comments)   Clindamycin Hcl Rash and Other (See Comments)    Katherina Right' syndrome   Lamotrigine Rash    Metabolic Disorder Labs: Lab Results  Component Value Date   HGBA1C 5.3 12/22/2021   No results found for: "PROLACTIN" Lab Results  Component Value Date   CHOL 232 (H) 07/07/2022   TRIG 238 (H) 07/07/2022   HDL 62 07/07/2022   CHOLHDL 3.3 12/22/2021   VLDL 39 (H) 08/17/2017   LDLCALC 128 (H) 07/07/2022   LDLCALC 113 (H) 12/22/2021   Lab Results  Component Value Date   TSH 1.100 12/22/2021   TSH 1.580 10/24/2021    Therapeutic Level Labs: Lab Results  Component Value Date   LITHIUM 0.66 05/07/2021   LITHIUM 0.72 04/22/2020   No results found for: "VALPROATE" No results found for: "CBMZ"  Current Medications: Current Outpatient Medications  Medication Sig Dispense Refill   QUEtiapine (SEROQUEL) 300 MG tablet Take 1 tablet (300 mg total) by mouth at bedtime. 90 tablet 0   benazepril (LOTENSIN) 40 MG tablet Take 1 tablet (40 mg total) by mouth daily. 90 tablet 4   busPIRone (BUSPAR) 15 MG tablet Take 15 mg by mouth 3 (three) times daily.     Calcium Citrate-Vitamin D (CALCIUM + D PO) Take 1 tablet by mouth 2 (two) times daily.     fexofenadine (ALLEGRA) 180 MG tablet Take 1 tablet (180 mg total) by mouth daily.  90 tablet 4   fluticasone (FLONASE) 50 MCG/ACT nasal spray SHAKE LIQUID AND USE 2 SPRAYS IN EACH NOSTRIL DAILY 16 g 7   metoprolol succinate (TOPROL-XL) 50 MG 24 hr tablet Take 1 tablet (50 mg total) by mouth daily. 90 tablet 4   montelukast (SINGULAIR) 10 MG tablet TAKE 1 TABLET(10 MG) BY MOUTH AT BEDTIME 90 tablet 4   Multiple Vitamin (MULTIVITAMIN) tablet Take 1 tablet by mouth daily.     mupirocin ointment (BACTROBAN) 2 % Apply 1 Application topically 2 (two) times daily. 22 g 0   Omega-3 Fatty Acids (FISH OIL) 1000 MG CAPS Take 1,000 mg by mouth 2 (two) times daily.     omeprazole (PRILOSEC) 40 MG capsule Take 1 capsule (40 mg total) by mouth daily. 90 capsule 4   oxybutynin (DITROPAN-XL) 10 MG 24 hr tablet TAKE 2 TABLETS(20 MG) BY MOUTH AT BEDTIME 180 tablet 4   QUEtiapine (SEROQUEL) 200 MG tablet Take 1 tablet (200 mg total) by mouth at bedtime. 90 tablet 0   rosuvastatin (CRESTOR) 40 MG tablet Take 1 tablet (40 mg total) by mouth daily. Stop Atorvastatin and start this medication. 90 tablet 4   venlafaxine XR (EFFEXOR XR) 75 MG 24 hr capsule Take 3 capsules (225 mg total) by mouth daily. 270 capsule 0   No current facility-administered medications for this visit.     Musculoskeletal: Strength & Muscle Tone:  N/A Gait & Station:  N/A Patient leans: N/A  Psychiatric Specialty Exam: Review of Systems  Psychiatric/Behavioral:  Positive for dysphoric mood and sleep disturbance. Negative for agitation, behavioral problems, confusion, decreased concentration, hallucinations, self-injury and suicidal ideas. The patient is nervous/anxious. The patient is not hyperactive.   All other systems reviewed and are negative.   There were no vitals taken for this visit.There is no height or weight  on file to calculate BMI.  General Appearance: Fairly Groomed  Eye Contact:  Good  Speech:  Clear and Coherent  Volume:  Normal  Mood:   better  Affect:  Appropriate, Congruent, and Full Range   Thought Process:  Coherent  Orientation:  Full (Time, Place, and Person)  Thought Content: Logical   Suicidal Thoughts:  No  Homicidal Thoughts:  No  Memory:  Immediate;   Good  Judgement:  Good  Insight:  Good  Psychomotor Activity:  Normal  Concentration:  Concentration: Good and Attention Span: Good  Recall:  Good  Fund of Knowledge: Good  Language: Good  Akathisia:  No  Handed:  Right  AIMS (if indicated): not done  Assets:  Communication Skills Desire for Improvement  ADL's:  Intact  Cognition: WNL  Sleep:  Poor   Screenings: GAD-7    Flowsheet Row Office Visit from 07/07/2022 in [place] Visit from 04/06/2022 in [place] Visit from 01/15/2022 in [place] Visit from 12/29/2021 in [place] Visit from 09/23/2021 in [place]  Total GAD-7 Score '18 3 4 3 1      '$ PHQ2-9    [place] Visit from 07/07/2022 in [place] Visit from 04/06/2022 in [place] Visit from 01/15/2022 in [place] Visit from 12/29/2021 in [facility] Video Visit from 10/24/2021 in [place]  PHQ-2 Total Score 1 1 0 0 0  PHQ-9 Total Score '14 2 3 4 '$ --      Flowsheet Row Video Visit from 05/06/2021 in [place] Video Visit from 03/25/2021 in [place] No Risk No Risk        Assessment and Plan:  KARIELLE DAVIDOW is a 71 y.o. year old female with a history of bipolar II disorder, history of stage III CKD (resolved after ACI was discontinued per patient), hypertension, GERD, PONV, hyperlipidemia, who presents for follow up appointment for below.   1. Bipolar II disorder ([place]) 2. Anxiety 3. Insomnia, unspecified type There has been overall improvement in depressive symptoms and anxiety since then prolonged  moving to her son's place.  Psychosocial stressors includes her husband with alcohol use. Other psychosocial stressors includes his brother, who was diagnosed with transverse myelitis.  Will continue current dose of venlafaxine to target depression and anxiety.  Will continue BuSpar for anxiety.  Will continue quetiapine for mood dysregulation, depression, anxiety and insomnia.  Will obtain EKG to monitor QTc prolongation. Noted that although she was diagnosed with bipolar 1 disorder by other provider, she had only hypomanic symptoms, and mainly struggled with depression. Will continue to monitor.  She will greatly benefit from CBT; she will continue to see her therapist.   Plan (She requested that no refills to be given as she has piles of medication. She will contact the office if she needs any refills.) Continue venlafaxine 225 mg daily Continue quetiapine 300 mg at night (uptitrated from 200 mg) Continue Buspar 15 mg three times a day Obtain EKG Next appointment- 1/19 at 10 AM for 30 mins, video  - she used to see sheryl lawson, therapist for many years  Past trials of medication: Abilify, lithium (xerostomia), gabapentin      The patient demonstrates the following risk factors for suicide: Chronic risk factors for suicide include: psychiatric disorder of bipolar disorder and previous suicide attempts of cutting, putting  cord around her neck, overdose on medication . Acute risk factors for suicide include: unemployment. Protective factors for this patient include: positive social support, coping skills, and hope for the future. Considering these factors, the overall suicide risk at this point appears to be low. Patient is appropriate for outpatient follow up.     This clinician has discussed the side effect associated with medication prescribed during this encounter. Please refer to notes in the previous encounters for more details.      Collaboration of Care: Collaboration of Care: Other  reviewed notes in Epic  Patient/Guardian was advised Release of Information must be obtained prior to any record release in order to collaborate their care with an outside provider. Patient/Guardian was advised if they have not already done so to contact the registration department to sign all necessary forms in order for [place] to release information regarding their care.   Consent: Patient/Guardian gives verbal consent for treatment and assignment of benefits for services provided during this visit. Patient/Guardian expressed understanding and agreed to proceed.    [name], MD 08/21/2022, 11:10 AM

## 2022-08-21 ENCOUNTER — Encounter: Payer: Self-pay | Admitting: Psychiatry

## 2022-08-21 ENCOUNTER — Telehealth: Payer: Self-pay | Admitting: Nurse Practitioner

## 2022-08-21 ENCOUNTER — Telehealth (INDEPENDENT_AMBULATORY_CARE_PROVIDER_SITE_OTHER): Payer: Medicare PPO | Admitting: Psychiatry

## 2022-08-21 DIAGNOSIS — G4700 Insomnia, unspecified: Secondary | ICD-10-CM

## 2022-08-21 DIAGNOSIS — F3181 Bipolar II disorder: Secondary | ICD-10-CM

## 2022-08-21 DIAGNOSIS — F419 Anxiety disorder, unspecified: Secondary | ICD-10-CM | POA: Diagnosis not present

## 2022-08-21 MED ORDER — QUETIAPINE FUMARATE 300 MG PO TABS: 300.0000 mg | ORAL_TABLET | Freq: Every day | ORAL | 0 refills | Status: DC

## 2022-08-21 NOTE — Patient Instructions (Signed)
Continue venlafaxine 225 mg daily Continue quetiapine 300 mg at night Continue Buspar 15 mg three times a day Obtain EKG (Please call [phone] to schedule this) Next appointment- 1/19 at 10 AM

## 2022-08-21 NOTE — Telephone Encounter (Signed)
Copied from [place] [id number]. Topic: General - Other >> Aug 21, 2022 11:11 AM Sabas Sous wrote: Reason for CRM: Pt needs a call back from the clinic to discuss lab work that she needs prior to having a sugery. Please advise   Best contact: [phone]

## 2022-08-21 NOTE — Telephone Encounter (Signed)
Pt scheduled 12/26

## 2022-08-25 ENCOUNTER — Ambulatory Visit: Payer: Medicare PPO | Admitting: Licensed Clinical Social Worker

## 2022-08-26 ENCOUNTER — Telehealth: Payer: Medicare PPO

## 2022-08-26 ENCOUNTER — Ambulatory Visit (INDEPENDENT_AMBULATORY_CARE_PROVIDER_SITE_OTHER): Payer: Medicare PPO

## 2022-08-26 DIAGNOSIS — I10 Essential (primary) hypertension: Secondary | ICD-10-CM

## 2022-08-26 DIAGNOSIS — Z87898 Personal history of other specified conditions: Secondary | ICD-10-CM

## 2022-08-26 DIAGNOSIS — F3175 Bipolar disorder, in partial remission, most recent episode depressed: Secondary | ICD-10-CM
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
## 2022-09-06 NOTE — Patient Instructions (Signed)
 Chronic Knee Pain, Adult Knee pain that lasts longer than 3 months is called chronic knee pain. You may have pain in one or both knees. Symptoms of chronic knee pain may also include swelling and stiffness. The most common cause is age-related wear and tear (osteoarthritis) of your knee joint. Many conditions can cause chronic knee pain. Treatment depends on the cause. The main treatments are physical therapy and weight loss. It may also be treated with medicines, injections, a knee sleeve or brace, and by using crutches. Rest, ice, pressure (compression), and elevation, also known as RICE therapy, may also be recommended. Follow these instructions at home: If you have a knee sleeve or brace:  Wear the knee sleeve or brace as told by your doctor. Take it off only as told by your doctor. Loosen it if your toes: Tingle. Become numb. Turn cold and blue. Keep it clean. If the sleeve or brace is not waterproof: Do not let it get wet. Ask your doctor if you may take it off when you take a bath or shower. If not, cover it with a watertight covering. Managing pain, stiffness, and swelling     If told, put heat on your knee. Do this as often as told by your doctor. Use the heat source that your doctor recommends, such as a moist heat pack or a heating pad. If you have a removable knee sleeve or brace, take it off as told by your doctor. Place a towel between your skin and the heat source. Leave the heat on for 20-30 minutes. Take off the heat if your skin turns bright red. This is very important. If you cannot feel pain, heat, or cold, you have a greater risk of getting burned. If told, put ice on your knee. To do this: If you have a removable knee sleeve or brace, take it off as told by your doctor. Put ice in a plastic bag. Place a towel between your skin and the bag. Leave the ice on for 20 minutes, 2-3 times a day. Take off the ice if your skin turns bright red. This is very important. If  you cannot feel pain, heat, or cold, you have a greater risk of damage to the area. Move your toes often. Raise the injured area above the level of your heart while you are sitting or lying down. Activity Avoid activities where both feet leave the ground at the same time (high-impact activities). Examples are running, jumping rope, and doing jumping jacks. Follow the exercise plan that your doctor makes for you. Your doctor may suggest that you: Avoid activities that make knee pain worse. You may need to change the exercises that you do, the sports that you participate in, or your job duties. Wear shoes with cushioned soles. Avoid sports that require running and sudden changes in direction. Do exercises or physical therapy. This is planned to match your needs and your abilities. Do exercises that increase your balance and strength, such as tai chi and yoga. Do not use your injured knee to support your body weight until your doctor says that you can. Use crutches as told by your doctor. Return to your normal activities when your doctor says that it is safe. General instructions Take over-the-counter and prescription medicines only as told by your doctor. If you are overweight, work with your doctor and a food expert (dietitian) to set goals to lose weight. Being overweight can make your knee hurt more. Do not smoke or use   any products that contain nicotine or tobacco. If you need help quitting, ask your doctor. Keep all follow-up visits. Contact a doctor if: You have knee pain that is not getting better or gets worse. You are not able to do your exercises due to knee pain. Get help right away if: Your knee swells and the swelling gets worse. You cannot move your knee. You have very bad knee pain. Summary Knee pain that lasts more than 3 months is called chronic knee pain. The main treatments for chronic knee pain are physical therapy and weight loss. You may also need to take medicines,  wear a knee sleeve or brace, use crutches, and put ice or heat on your knee. Lose weight if you are overweight. Work with your doctor and a food expert (dietitian) to help you set goals to lose weight. Being overweight can make your knee hurt more. Follow the exercise plan that your doctor makes for you. This information is not intended to replace advice given to you by your health care provider. Make sure you discuss any questions you have with your health care provider. Document Revised: 02/14/2020 Document Reviewed: 02/14/2020 Elsevier Patient Education  2023 Elsevier Inc.  

## 2022-09-08 ENCOUNTER — Telehealth: Payer: Self-pay | Admitting: Psychiatry

## 2022-09-08 ENCOUNTER — Encounter: Payer: Self-pay | Admitting: Nurse Practitioner

## 2022-09-08 ENCOUNTER — Ambulatory Visit: Payer: Medicare PPO | Admitting: Nurse Practitioner

## 2022-09-08 VITALS — BP 110/75 | HR 62 | Temp 98.4°F | Ht 65.32 in | Wt 202.6 lb

## 2022-09-08 DIAGNOSIS — Z01818 Encounter for other preprocedural examination: Secondary | ICD-10-CM

## 2022-09-08 DIAGNOSIS — M25562 Pain in left knee: Secondary | ICD-10-CM | POA: Diagnosis not present

## 2022-09-08 DIAGNOSIS — G8929 Other chronic pain: Secondary | ICD-10-CM

## 2022-09-08 LAB — URINALYSIS, ROUTINE W REFLEX MICROSCOPIC
Bilirubin, UA: NEGATIVE
Glucose, UA: NEGATIVE
Ketones, UA: NEGATIVE
Nitrite, UA: NEGATIVE
Protein,UA: NEGATIVE
RBC, UA: NEGATIVE
Specific Gravity, UA: 1.02 (ref 1.005–1.030)
Urobilinogen, Ur: 0.2 mg/dL (ref 0.2–1.0)
pH, UA: 5.5 (ref 5.0–7.5)

## 2022-09-08 LAB — MICROSCOPIC EXAMINATION: Bacteria, UA: NONE SEEN

## 2022-09-08 NOTE — Progress Notes (Signed)
BP 110/75   Pulse 62   Temp 98.4 F (36.9 C) (Oral)   Ht 5' 5.32" (1.659 m)   Wt 202 lb 9.6 oz (91.9 kg)   SpO2 96%   BMI 33.39 kg/m    Subjective:    Patient ID: Elizabeth Malone, female    DOB: 1951-08-10, 71 y.o.   MRN: [id number]  HPI: Elizabeth Malone is a 71 y.o. female  Chief Complaint  Patient presents with   Surg Clearance   KNEE PAIN Goes January 25th for left knee replacement -- [facility] to be performed in [place] -- Dr. Harlow Mares.  Here today for pre-op with need for labs and EKG.  Overall RCRI (risk score) 0.4. Duration: chronic Involved knee: left Mechanism of injury: unknown Location:diffuse Onset: gradual Severity: 10/10  Quality:  dull, aching, and throbbing Frequency: intermittent Radiation: no Aggravating factors: weight bearing, walking, bending, and movement  Alleviating factors: physical therapy, APAP, NSAIDs, and rest  Status: fluctuating Treatments attempted: ice, heat, APAP, ibuprofen, and physical therapy  Relief with NSAIDs?:  no Weakness with weight bearing or walking: yes Sensation of giving way: yes Locking: yes Popping: yes Bruising: no Swelling: yes Redness: no Paresthesias/decreased sensation: no Fevers: no  Relevant past medical, surgical, family and social history reviewed and updated as indicated. Interim medical history since our last visit reviewed. Allergies and medications reviewed and updated.  Review of Systems  Constitutional:  Negative for activity change, appetite change, diaphoresis, fatigue and fever.  Respiratory:  Negative for cough, chest tightness and shortness of breath.   Cardiovascular:  Negative for chest pain, palpitations and leg swelling.  Gastrointestinal: Negative.   Endocrine: Negative.   Neurological: Negative.   Psychiatric/Behavioral:  Negative for decreased concentration, self-injury, sleep disturbance and suicidal ideas. The patient is nervous/anxious.    Per HPI unless specifically indicated  above     Objective:    BP 110/75   Pulse 62   Temp 98.4 F (36.9 C) (Oral)   Ht 5' 5.32" (1.659 m)   Wt 202 lb 9.6 oz (91.9 kg)   SpO2 96%   BMI 33.39 kg/m   Wt Readings from Last 3 Encounters:  09/08/22 202 lb 9.6 oz (91.9 kg)  07/07/22 202 lb 4.8 oz (91.8 kg)  04/16/22 196 lb (88.9 kg)    Physical Exam Vitals and nursing note reviewed.  Constitutional:      General: She is awake. She is not in acute distress.    Appearance: She is well-developed and well-groomed. She is obese. She is not ill-appearing or toxic-appearing.  HENT:     Head: Normocephalic.     Right Ear: Hearing normal.     Left Ear: Hearing normal.  Eyes:     General: Lids are normal.        Right eye: No discharge.        Left eye: No discharge.     Conjunctiva/sclera: Conjunctivae normal.     Pupils: Pupils are equal, round, and reactive to light.  Neck:     Thyroid: No thyromegaly.     Vascular: No carotid bruit.  Cardiovascular:     Rate and Rhythm: Normal rate and regular rhythm.     Heart sounds: Normal heart sounds. No murmur heard.    No gallop.  Pulmonary:     Effort: Pulmonary effort is normal. No accessory muscle usage or respiratory distress.     Breath sounds: Normal breath sounds.  Abdominal:     General: Bowel  sounds are normal.     Palpations: Abdomen is soft.  Musculoskeletal:     Cervical back: Normal range of motion and neck supple.     Right lower leg: No edema.     Left lower leg: No edema.  Lymphadenopathy:     Cervical: No cervical adenopathy.  Skin:    General: Skin is warm and dry.  Neurological:     Mental Status: She is alert and oriented to person, place, and time.  Psychiatric:        Attention and Perception: Attention normal.        Mood and Affect: Mood is anxious. Affect is tearful.        Speech: Speech normal.        Behavior: Behavior normal. Behavior is cooperative.        Thought Content: Thought content normal.   EKG My review and personal  interpretation at Time: 1000  Indication: pre op surgery  Rate: 58  Rhythm: sinus bradycardia Axis: normal Other: no nonspecific st abn, no stemi, no lvh  Results for orders placed or performed in visit on 07/07/22  Microalbumin, Urine Waived  Result Value Ref Range   Microalb, Ur Waived 30 (H) 0 - 19 mg/L   Creatinine, Urine Waived 100 10 - 300 mg/dL   Microalb/Creat Ratio <30 <30 mg/g  Comprehensive metabolic panel  Result Value Ref Range   Glucose 112 (H) 70 - 99 mg/dL   BUN 13 8 - 27 mg/dL   Creatinine, Ser 0.97 0.57 - 1.00 mg/dL   eGFR 63 >59 mL/min/1.73   BUN/Creatinine Ratio 13 12 - 28   Sodium 140 134 - 144 mmol/L   Potassium 4.5 3.5 - 5.2 mmol/L   Chloride 101 96 - 106 mmol/L   CO2 24 20 - 29 mmol/L   Calcium 10.0 8.7 - 10.3 mg/dL   Total Protein 7.3 6.0 - 8.5 g/dL   Albumin 4.7 3.9 - 4.9 g/dL   Globulin, Total 2.6 1.5 - 4.5 g/dL   Albumin/Globulin Ratio 1.8 1.2 - 2.2   Bilirubin Total 0.5 0.0 - 1.2 mg/dL   Alkaline Phosphatase 170 (H) 44 - 121 IU/L   AST 40 0 - 40 IU/L   ALT 136 (H) 0 - 32 IU/L  Lipid Panel w/o Chol/HDL Ratio  Result Value Ref Range   Cholesterol, Total 232 (H) 100 - 199 mg/dL   Triglycerides 238 (H) 0 - 149 mg/dL   HDL 62 >39 mg/dL   VLDL Cholesterol Cal 42 (H) 5 - 40 mg/dL   LDL Chol Calc (NIH) 128 (H) 0 - 99 mg/dL  VITAMIN D 25 Hydroxy (Vit-D Deficiency, Fractures)  Result Value Ref Range   Vit D, 25-Hydroxy 21.7 (L) 30.0 - 100.0 ng/mL  Magnesium  Result Value Ref Range   Magnesium 1.9 1.6 - 2.3 mg/dL      Assessment & Plan:   Problem List Items Addressed This Visit       Other   Chronic pain of left knee - Primary    Chronic, ongoing.  Scheduled for surgery upcoming.  Labs and EKG obtained today.      Other Visit Diagnoses     Pre-op evaluation       Left knee replacement upcoming, labs and EKG performed today.   Relevant Orders   Comp. Metabolic Panel (12)   Urinalysis, Routine w reflex microscopic   CBC with  Differential/Platelet   HgB A1c   EKG 12-Lead (Completed)  Follow up plan: Return in about 6 months (around 03/10/2023) for HTN/HLD, OAB, OSTEOPENIA, MOOD.

## 2022-09-08 NOTE — Telephone Encounter (Addendum)
Reviewed EKG. QTc H 376 msec.  Please advise the patient to continue current dose of quetiapine.   ----- Message from Venita Lick, NP sent at 09/08/2022 10:05 AM EST ----- Overall normal EKG with sinus bradycardia. ----- Message ----- From: Jerelene Redden, CMA Sent: 09/08/2022  10:01 AM EST To: Venita Lick, NP

## 2022-09-08 NOTE — Progress Notes (Signed)
Noted! Thank you

## 2022-09-08 NOTE — Assessment & Plan Note (Signed)
Chronic, ongoing.  Scheduled for surgery upcoming.  Labs and EKG obtained today.

## 2022-09-08 NOTE — Telephone Encounter (Signed)
Yes it is okay. Please call them to cancel the appointment. I sent a message to RN regarding EKG- it was good. Please advise her to stay on the current dose of quetiapine.

## 2022-09-08 NOTE — Progress Notes (Signed)
Contacted via MyChart  Urine testing shows no infection present.

## 2022-09-08 NOTE — Telephone Encounter (Signed)
Patient called in stating that she had an EKG done today at her appointment with [facility] and is wanting to know if it is okay that she cancels her upcoming appointment for an EKG since she had one done today.  Patients # [phone]

## 2022-09-09 LAB — COMP. METABOLIC PANEL (12)
AST: 33 IU/L (ref 0–40)
Albumin/Globulin Ratio: 1.7 (ref 1.2–2.2)
Albumin: 4.2 g/dL (ref 3.8–4.8)
Alkaline Phosphatase: 126 IU/L — ABNORMAL HIGH (ref 44–121)
BUN/Creatinine Ratio: 14 (ref 12–28)
BUN: 13 mg/dL (ref 8–27)
Bilirubin Total: 0.3 mg/dL (ref 0.0–1.2)
Calcium: 9.9 mg/dL (ref 8.7–10.3)
Chloride: 102 mmol/L (ref 96–106)
Creatinine, Ser: 0.9 mg/dL (ref 0.57–1.00)
Globulin, Total: 2.5 g/dL (ref 1.5–4.5)
Glucose: 105 mg/dL — ABNORMAL HIGH (ref 70–99)
Potassium: 4.7 mmol/L (ref 3.5–5.2)
Sodium: 140 mmol/L (ref 134–144)
Total Protein: 6.7 g/dL (ref 6.0–8.5)
eGFR: 68 mL/min/{1.73_m2} (ref >59)

## 2022-09-09 LAB — HEMOGLOBIN A1C
Est. average glucose Bld gHb Est-mCnc: 117 mg/dL
Hgb A1c MFr Bld: 5.7 % — ABNORMAL HIGH (ref 4.8–5.6)

## 2022-09-09 NOTE — Progress Notes (Signed)
Contacted via MyChart   Good morning Elizabeth Malone, just waiting on your CBC (to look for anemia), but overall current labs are stable for surgery and I have signed papers.  A1c is in prediabetic range, focus heavily on diet changes after surgery and getting out for walks 30 minutes 5 days a week.  Any questions? Keep being amazing!!  Thank you for allowing me to participate in your care.  I appreciate you. Kindest regards, Fredonia Casalino

## 2022-09-09 NOTE — Telephone Encounter (Signed)
pt was given results and instructions to continue with medication.

## 2022-09-10 ENCOUNTER — Telehealth: Payer: Self-pay

## 2022-09-10 LAB — CBC WITH DIFFERENTIAL/PLATELET
Basophils Absolute: 0 10*3/uL (ref 0.0–0.2)
Basos: 1 %
EOS (ABSOLUTE): 0.2 10*3/uL (ref 0.0–0.4)
Eos: 2 %
Hematocrit: 43.5 % (ref 34.0–46.6)
Hemoglobin: 14.1 g/dL (ref 11.1–15.9)
Immature Grans (Abs): 0 10*3/uL (ref 0.0–0.1)
Immature Granulocytes: 1 %
Lymphocytes Absolute: 2.1 10*3/uL (ref 0.7–3.1)
Lymphs: 33 %
MCH: 29.5 pg (ref 26.6–33.0)
MCHC: 32.4 g/dL (ref 31.5–35.7)
MCV: 91 fL (ref 79–97)
Monocytes Absolute: 0.4 10*3/uL (ref 0.1–0.9)
Monocytes: 7 %
Neutrophils Absolute: 3.5 10*3/uL (ref 1.4–7.0)
Neutrophils: 56 %
Platelets: 253 10*3/uL (ref 150–450)
RBC: 4.78 x10E6/uL (ref 3.77–5.28)
RDW: 11.8 % (ref 11.7–15.4)
WBC: 6.2 10*3/uL (ref 3.4–10.8)

## 2022-09-10 LAB — SPECIMEN STATUS REPORT

## 2022-09-10 NOTE — Progress Notes (Signed)
Contacted via [place] -- can we fax this with ortho pre -op forms please:)

## 2022-09-10 NOTE — Telephone Encounter (Signed)
Surgical Clearance forms faxed back to [facility]

## 2022-09-11 ENCOUNTER — Ambulatory Visit: Payer: Medicare PPO | Attending: Nurse Practitioner

## 2022-09-13 DIAGNOSIS — I10 Essential (primary) hypertension: Secondary | ICD-10-CM

## 2022-09-13 DIAGNOSIS — F3175 Bipolar disorder, in partial remission, most recent episode depressed: Secondary | ICD-10-CM

## 2022-09-15 ENCOUNTER — Telehealth: Payer: Self-pay | Admitting: Nurse Practitioner

## 2022-09-15 MED ORDER — FENOFIBRATE 145 MG PO TABS: 145.0000 mg | ORAL_TABLET | Freq: Every day | ORAL | 4 refills | Status: DC

## 2022-09-15 NOTE — Telephone Encounter (Signed)
Fenofibrate discontinued on 07/07/22 by provider, patient is requesting refill. No documentation to support patient to continue. Will route to the provider for recommendation.

## 2022-09-15 NOTE — Telephone Encounter (Signed)
Medication Refill - Medication: fenofibrate (TRICOR) 145 MG tablet   Has the patient contacted their pharmacy? Yes.   Pharmacy called. Patient is requesting medication.  Preferred Pharmacy (with phone number or street name):  [facility], [place] Phone: [phone]  Fax: [phone]     Has the patient been seen for an appointment in the last year OR does the patient have an upcoming appointment? Yes.    Agent: Please be advised that RX refills may take up to 3 business days. We ask that you follow-up with your pharmacy.

## 2022-09-30 ENCOUNTER — Other Ambulatory Visit: Payer: Self-pay | Admitting: Nurse Practitioner

## 2022-09-30 ENCOUNTER — Telehealth: Payer: Self-pay | Admitting: Nurse Practitioner

## 2022-09-30 DIAGNOSIS — Z1231 Encounter for screening mammogram for malignant neoplasm of breast: Secondary | ICD-10-CM

## 2022-09-30 DIAGNOSIS — Z1211 Encounter for screening for malignant neoplasm of colon: Secondary | ICD-10-CM

## 2022-09-30 NOTE — Telephone Encounter (Signed)
Copied from [place] [id number]. Topic: General - Other >> Sep 30, 2022 11:41 AM Cyndi Bender wrote: Reason for CRM: Pt would like a call back to advise if it is time for her colonoscopy. Cb# [phone]

## 2022-09-30 NOTE — Telephone Encounter (Signed)
Patient would like a referral for a colonoscopy
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
## 2022-10-02 ENCOUNTER — Telehealth (INDEPENDENT_AMBULATORY_CARE_PROVIDER_SITE_OTHER): Payer: Medicare PPO | Admitting: Psychiatry

## 2022-10-02 ENCOUNTER — Encounter: Payer: Self-pay | Admitting: Psychiatry

## 2022-10-02 DIAGNOSIS — F3181 Bipolar II disorder: Secondary | ICD-10-CM

## 2022-10-02 DIAGNOSIS — G4700 Insomnia, unspecified: Secondary | ICD-10-CM

## 2022-10-02 DIAGNOSIS — F419 Anxiety disorder, unspecified: Secondary | ICD-10-CM | POA: Diagnosis not present

## 2022-10-02 MED ORDER — BUSPIRONE HCL 10 MG PO TABS: 20.0000 mg | ORAL_TABLET | Freq: Three times a day (TID) | ORAL | 0 refills | Status: AC

## 2022-10-02 NOTE — Patient Instructions (Signed)
Continue venlafaxine 225 mg daily Continue quetiapine 300 mg at night  Increase Buspar 20 mg three times a day Next appointment- 3/1 at 10 AM

## 2022-10-07 ENCOUNTER — Ambulatory Visit: Payer: Medicare PPO | Admitting: Nurse Practitioner

## 2022-10-09 DIAGNOSIS — Z96659 Presence of unspecified artificial knee joint: Secondary | ICD-10-CM | POA: Insufficient documentation

## 2022-10-27 ENCOUNTER — Telehealth: Payer: Medicare PPO

## 2022-10-27 ENCOUNTER — Ambulatory Visit (INDEPENDENT_AMBULATORY_CARE_PROVIDER_SITE_OTHER): Payer: Medicare PPO

## 2022-10-27 DIAGNOSIS — F3175 Bipolar disorder, in partial remission, most recent episode depressed: Secondary | ICD-10-CM

## 2022-10-27 DIAGNOSIS — I10 Essential (primary) hypertension: Secondary | ICD-10-CM
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
## 2022-11-12 DIAGNOSIS — F319 Bipolar disorder, unspecified: Secondary | ICD-10-CM | POA: Diagnosis not present

## 2022-11-12 DIAGNOSIS — I10 Essential (primary) hypertension: Secondary | ICD-10-CM

## 2022-11-13 ENCOUNTER — Telehealth (INDEPENDENT_AMBULATORY_CARE_PROVIDER_SITE_OTHER): Payer: Medicare PPO | Admitting: Psychiatry

## 2022-11-13 ENCOUNTER — Encounter: Payer: Self-pay | Admitting: Psychiatry

## 2022-11-13 DIAGNOSIS — F4310 Post-traumatic stress disorder, unspecified: Secondary | ICD-10-CM | POA: Diagnosis not present

## 2022-11-13 DIAGNOSIS — G4700 Insomnia, unspecified: Secondary | ICD-10-CM | POA: Diagnosis not present

## 2022-11-13 DIAGNOSIS — F419 Anxiety disorder, unspecified: Secondary | ICD-10-CM

## 2022-11-13 DIAGNOSIS — F3181 Bipolar II disorder: Secondary | ICD-10-CM

## 2022-11-13 NOTE — Patient Instructions (Signed)
Continue venlafaxine 225 mg daily Continue quetiapine 300 mg at night   Continue Buspar 20 mg three times a day Next appointment- 4/12 at 11 am

## 2022-11-16 ENCOUNTER — Ambulatory Visit
Admission: RE | Admit: 2022-11-16 | Discharge: 2022-11-16 | Disposition: A | Discharge status: Home / Self Care | Payer: Medicare PPO | Source: Ambulatory Visit | Attending: Nurse Practitioner | Admitting: Nurse Practitioner

## 2022-11-16 DIAGNOSIS — Z1231 Encounter for screening mammogram for malignant neoplasm of breast: Secondary | ICD-10-CM | POA: Diagnosis not present

## 2022-11-18 NOTE — Progress Notes (Signed)
Contacted via MyChart   Normal mammogram, may repeat in one year:)

## 2022-11-25 ENCOUNTER — Telehealth: Payer: Self-pay

## 2022-11-25 ENCOUNTER — Other Ambulatory Visit: Payer: Self-pay

## 2022-11-25 DIAGNOSIS — F3181 Bipolar II disorder: Secondary | ICD-10-CM

## 2022-11-25 DIAGNOSIS — F3175 Bipolar disorder, in partial remission, most recent episode depressed: Secondary | ICD-10-CM

## 2022-11-25 DIAGNOSIS — F419 Anxiety disorder, unspecified: Secondary | ICD-10-CM

## 2022-11-25 MED ORDER — VENLAFAXINE HCL ER 75 MG PO CP24: 225.0000 mg | ORAL_CAPSULE | Freq: Every day | ORAL | 0 refills | Status: DC

## 2022-11-25 MED ORDER — OXYBUTYNIN CHLORIDE ER 10 MG PO TB24: ORAL_TABLET | ORAL | 4 refills | Status: DC

## 2022-11-25 MED ORDER — QUETIAPINE FUMARATE 300 MG PO TABS: 300.0000 mg | ORAL_TABLET | Freq: Every day | ORAL | 0 refills | Status: DC

## 2022-11-25 NOTE — Telephone Encounter (Signed)
I have sent Seroquel 300 mg at bedtime for 30 days.  To [facility].  Will defer future refills to Dr. Modesta Messing.

## 2022-11-25 NOTE — Telephone Encounter (Signed)
Called patient to make aware of the Rx being sent to her pharmacy no answer left voicemail for patient return call to office
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
## 2022-11-25 NOTE — Telephone Encounter (Signed)
spoke with diamond at the pharmacy told her to cancel the quetiapine rx that was just sent.

## 2022-11-25 NOTE — Telephone Encounter (Signed)
I have sent Seroquel to [facility].  I have sent venlafaxine-to [facility] pharmacy per request.

## 2022-11-25 NOTE — Telephone Encounter (Signed)
Medication refill for Oxbutynin ER '100mg'$  last ov 09/08/22, upcoming ov 03/10/23 . Please advise

## 2022-11-25 NOTE — Telephone Encounter (Signed)
pt left a message that she needs Continue quetiapine 300 mg at night sent to the [facility] in [place].

## 2022-11-26 NOTE — Telephone Encounter (Signed)
Pt was notified by mail

## 2022-12-22 ENCOUNTER — Telehealth: Payer: Medicare PPO

## 2022-12-22 ENCOUNTER — Telehealth: Payer: Self-pay
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
## 2022-12-25 ENCOUNTER — Ambulatory Visit (INDEPENDENT_AMBULATORY_CARE_PROVIDER_SITE_OTHER): Payer: Medicare PPO

## 2022-12-25 ENCOUNTER — Telehealth (INDEPENDENT_AMBULATORY_CARE_PROVIDER_SITE_OTHER): Payer: Medicare PPO | Admitting: Psychiatry

## 2022-12-25 ENCOUNTER — Encounter: Payer: Self-pay | Admitting: Psychiatry

## 2022-12-25 ENCOUNTER — Telehealth: Payer: Medicare PPO

## 2022-12-25 DIAGNOSIS — F419 Anxiety disorder, unspecified: Secondary | ICD-10-CM

## 2022-12-25 DIAGNOSIS — G4700 Insomnia, unspecified: Secondary | ICD-10-CM

## 2022-12-25 DIAGNOSIS — F4310 Post-traumatic stress disorder, unspecified: Secondary | ICD-10-CM

## 2022-12-25 DIAGNOSIS — F3181 Bipolar II disorder: Secondary | ICD-10-CM | POA: Diagnosis not present

## 2022-12-25 DIAGNOSIS — Z87898 Personal history of other specified conditions: Secondary | ICD-10-CM

## 2022-12-25 DIAGNOSIS — F3175 Bipolar disorder, in partial remission, most recent episode depressed: Secondary | ICD-10-CM

## 2022-12-25 DIAGNOSIS — I10 Essential (primary) hypertension: Secondary | ICD-10-CM

## 2022-12-25 NOTE — Patient Instructions (Signed)
Continue venlafaxine 225 mg daily Continue quetiapine 300 mg at night   Continue Buspar 20 mg three times a day Next appointment- 6/14 at 10 30
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
## 2023-01-05 ENCOUNTER — Other Ambulatory Visit: Payer: Self-pay | Admitting: Psychiatry

## 2023-01-05 DIAGNOSIS — F3181 Bipolar II disorder: Secondary | ICD-10-CM

## 2023-01-12 DIAGNOSIS — F319 Bipolar disorder, unspecified: Secondary | ICD-10-CM | POA: Diagnosis not present

## 2023-01-12 DIAGNOSIS — I10 Essential (primary) hypertension: Secondary | ICD-10-CM

## 2023-01-19 DIAGNOSIS — H25013 Cortical age-related cataract, bilateral: Secondary | ICD-10-CM | POA: Diagnosis not present

## 2023-01-19 DIAGNOSIS — H02834 Dermatochalasis of left upper eyelid: Secondary | ICD-10-CM | POA: Diagnosis not present

## 2023-01-19 DIAGNOSIS — H35363 Drusen (degenerative) of macula, bilateral: Secondary | ICD-10-CM | POA: Diagnosis not present

## 2023-01-19 DIAGNOSIS — H02831 Dermatochalasis of right upper eyelid: Secondary | ICD-10-CM | POA: Diagnosis not present

## 2023-01-19 DIAGNOSIS — H5360 Unspecified night blindness: Secondary | ICD-10-CM | POA: Diagnosis not present

## 2023-01-19 DIAGNOSIS — H35033 Hypertensive retinopathy, bilateral: Secondary | ICD-10-CM | POA: Diagnosis not present

## 2023-01-19 DIAGNOSIS — H04123 Dry eye syndrome of bilateral lacrimal glands: Secondary | ICD-10-CM | POA: Diagnosis not present

## 2023-01-19 DIAGNOSIS — H02825 Cysts of left lower eyelid: Secondary | ICD-10-CM | POA: Diagnosis not present

## 2023-01-21 ENCOUNTER — Other Ambulatory Visit: Payer: Self-pay | Admitting: *Deleted

## 2023-01-21 ENCOUNTER — Telehealth: Payer: Self-pay

## 2023-01-21 ENCOUNTER — Telehealth: Payer: Self-pay | Admitting: *Deleted

## 2023-01-21 DIAGNOSIS — Z86010 Personal history of colonic polyps: Secondary | ICD-10-CM

## 2023-01-21 DIAGNOSIS — Z800 Family history of malignant neoplasm of digestive organs: Secondary | ICD-10-CM

## 2023-01-21 MED ORDER — NA SULFATE-K SULFATE-MG SULF 17.5-3.13-1.6 GM/177ML PO SOLN: 1.0000 | Freq: Once | ORAL | 0 refills | Status: AC

## 2023-01-21 NOTE — Telephone Encounter (Signed)
Gastroenterology Pre-Procedure Review  Request Date: 03/08/2023 Requesting Physician: Dr. Servando Snare  PATIENT REVIEW QUESTIONS: The patient responded to the following health history questions as indicated:    1. Are you having any GI issues? no 2. Do you have a personal history of Polyps? yes (10/07/2017) 3. Do you have a family history of Colon Cancer or Polyps? yes (mother and brother had polyps) 4. Diabetes Mellitus? no 5. Joint replacements in the past 12 months?no 6. Major health problems in the past 3 months?no 7. Any artificial heart valves, MVP, or defibrillator?no    MEDICATIONS & ALLERGIES:    Patient reports the following regarding taking any anticoagulation/antiplatelet therapy:   Plavix, Coumadin, Eliquis, Xarelto, Lovenox, Pradaxa, Brilinta, or Effient? no Aspirin? no  Patient confirms/reports the following medications:  Current Outpatient Medications  Medication Sig Dispense Refill   benazepril (LOTENSIN) 40 MG tablet Take 1 tablet (40 mg total) by mouth daily. 90 tablet 4   Calcium Citrate-Vitamin D (CALCIUM + D PO) Take 1 tablet by mouth 2 (two) times daily.     fenofibrate (TRICOR) 145 MG tablet Take 1 tablet (145 mg total) by mouth daily. 90 tablet 4   fexofenadine (ALLEGRA) 180 MG tablet Take 1 tablet (180 mg total) by mouth daily. 90 tablet 4   fluticasone (FLONASE) 50 MCG/ACT nasal spray SHAKE LIQUID AND USE 2 SPRAYS IN EACH NOSTRIL DAILY 16 g 7   metoprolol succinate (TOPROL-XL) 50 MG 24 hr tablet Take 1 tablet (50 mg total) by mouth daily. 90 tablet 4   montelukast (SINGULAIR) 10 MG tablet TAKE 1 TABLET(10 MG) BY MOUTH AT BEDTIME 90 tablet 4   Multiple Vitamin (MULTIVITAMIN) tablet Take 1 tablet by mouth daily.     mupirocin ointment (BACTROBAN) 2 % Apply 1 Application topically 2 (two) times daily. 22 g 0   Omega-3 Fatty Acids (FISH OIL) 1000 MG CAPS Take 1,000 mg by mouth 2 (two) times daily.     omeprazole (PRILOSEC) 40 MG capsule Take 1 capsule (40 mg total) by  mouth daily. 90 capsule 4   oxybutynin (DITROPAN-XL) 10 MG 24 hr tablet TAKE 2 TABLETS(20 MG) BY MOUTH AT BEDTIME 180 tablet 4   QUEtiapine (SEROQUEL) 300 MG tablet Take 1 tablet (300 mg total) by mouth at bedtime. 90 tablet 0   rosuvastatin (CRESTOR) 40 MG tablet Take 1 tablet (40 mg total) by mouth daily. Stop Atorvastatin and start this medication. 90 tablet 4   venlafaxine XR (EFFEXOR XR) 75 MG 24 hr capsule Take 3 capsules (225 mg total) by mouth daily. 270 capsule 0   No current facility-administered medications for this visit.    Patient confirms/reports the following allergies:  Allergies  Allergen Reactions   Fluoxetine Other (See Comments)   Hydrocodone-Chlorpheniramine Other (See Comments)   Paroxetine Hcl Other (See Comments)   Penicillin G Benzathine Other (See Comments)   Penicillin V Potassium Other (See Comments)   Clindamycin Hcl Rash and Other (See Comments)    Trudie Buckler' syndrome   Lamotrigine Rash    No orders of the defined types were placed in this encounter.   AUTHORIZATION INFORMATION Primary Insurance: 1D#: Group #:  Secondary Insurance: 1D#: Group #:  SCHEDULE INFORMATION: Date: 03/08/2023 Time: Location:  [facility]

## 2023-01-21 NOTE — Telephone Encounter (Signed)
I spoke to pt and answered questions regarding colonoscopy

## 2023-01-21 NOTE — Telephone Encounter (Signed)
I have called patient back regarding her repeat colonoscopy.   Patient is concern regarding diverticulitis because her mother, her brother, and her son had it. She stated that her son recently was in surgery for diverticulitis, it took 7 hours. She wonders if she have it but she does not have any abdominal pain, fever, nausea, and a change in bowel habits.  Please advise. Colonoscopy is schedule on 03/08/2023.

## 2023-01-21 NOTE — Telephone Encounter (Signed)
Pt lvm to schedule colonoscopy please return call

## 2023-01-29 ENCOUNTER — Telehealth: Payer: Medicare PPO

## 2023-01-29 ENCOUNTER — Ambulatory Visit (INDEPENDENT_AMBULATORY_CARE_PROVIDER_SITE_OTHER): Payer: Medicare PPO

## 2023-01-29 DIAGNOSIS — F3175 Bipolar disorder, in partial remission, most recent episode depressed: Secondary | ICD-10-CM

## 2023-01-29 DIAGNOSIS — Z87898 Personal history of other specified conditions: Secondary | ICD-10-CM

## 2023-01-29 DIAGNOSIS — F419 Anxiety disorder, unspecified: Secondary | ICD-10-CM

## 2023-01-29 DIAGNOSIS — I10 Essential (primary) hypertension: Secondary | ICD-10-CM
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
## 2023-02-03 ENCOUNTER — Telehealth: Payer: Self-pay

## 2023-02-03 ENCOUNTER — Other Ambulatory Visit: Payer: Self-pay | Admitting: Psychiatry

## 2023-02-03 MED ORDER — QUETIAPINE FUMARATE 25 MG PO TABS: 25.0000 mg | ORAL_TABLET | Freq: Every day | ORAL | 0 refills | Status: DC

## 2023-02-03 NOTE — Telephone Encounter (Signed)
pt left message that she is not doing good she is very depressed and tearful.
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
## 2023-02-10 ENCOUNTER — Telehealth: Payer: Self-pay

## 2023-02-10 NOTE — Telephone Encounter (Signed)
Patient stated that she had sign up to donate blood on 02/21/2023. I have inform patient that it is not a good idea to donate blood before a colonoscopy as donating weaken the body. It is suggested if she wants to donate after colonoscopy, patient should wait 2-4 weeks from the procedure.  Patient is not going to do it. She will passed it this year.

## 2023-02-10 NOTE — Telephone Encounter (Signed)
Pt left vm to see if participating in  blood drive can effect colonoscopy  please return call to [phone]

## 2023-02-12 DIAGNOSIS — F319 Bipolar disorder, unspecified: Secondary | ICD-10-CM

## 2023-02-12 DIAGNOSIS — I10 Essential (primary) hypertension: Secondary | ICD-10-CM

## 2023-02-20 ENCOUNTER — Telehealth (HOSPITAL_COMMUNITY): Payer: Self-pay

## 2023-02-21 NOTE — Telephone Encounter (Signed)
Could you contact her to see if she needs a refill? She previously asked me to check, as she has a large supply of medication.
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
## 2023-02-22 ENCOUNTER — Other Ambulatory Visit: Payer: Self-pay | Admitting: Psychiatry

## 2023-02-22 DIAGNOSIS — F419 Anxiety disorder, unspecified: Secondary | ICD-10-CM

## 2023-02-22 DIAGNOSIS — F3175 Bipolar disorder, in partial remission, most recent episode depressed: Secondary | ICD-10-CM

## 2023-02-22 MED ORDER — VENLAFAXINE HCL ER 75 MG PO CP24
225.0000 mg | ORAL_CAPSULE | Freq: Every day | ORAL | 0 refills | Status: DC
Start: 2023-02-22 — End: 2023-05-05

## 2023-02-22 NOTE — Telephone Encounter (Signed)
pt states she only has 10 days left to please send refills to the [facility].

## 2023-02-22 NOTE — Telephone Encounter (Signed)
Ordered

## 2023-02-23 ENCOUNTER — Encounter: Payer: Self-pay | Admitting: Nurse Practitioner

## 2023-02-23 ENCOUNTER — Telehealth: Payer: Self-pay

## 2023-02-23 NOTE — Telephone Encounter (Signed)
Patient called and states that pre services called and told her she had a 250 dollar copay for her colonoscopy. She states that she called and her insurance company and due to her high risk family history if we change the diagnosis to to high risk family history of in mother of colon cancer. She also stated that her son had 7 hour surgery on his colon. She states she wants this diagnosis changed and a call back as soon as possible.

## 2023-02-23 NOTE — Telephone Encounter (Signed)
pt notified rx sent  

## 2023-02-24 NOTE — Telephone Encounter (Addendum)
Tried to explain patient that she had colon polyps on her last colonoscopy. So patient may still have the 250 copay even though she have family history. Patient wanted the Dx code any way for personal history of colon polyps and try to get rid the 250 copay.  It just sounds like the patient is not understanding at all. She was more concern about the copay then me trying to explain to her that she have personal history of colon polyps just screening for colon cancer anymore. Patient got upset with me.

## 2023-02-24 NOTE — Telephone Encounter (Signed)
Message left for patient to return my call.  

## 2023-02-26 ENCOUNTER — Telehealth (INDEPENDENT_AMBULATORY_CARE_PROVIDER_SITE_OTHER): Payer: Medicare PPO | Admitting: Psychiatry

## 2023-02-26 ENCOUNTER — Encounter: Payer: Self-pay | Admitting: Psychiatry

## 2023-02-26 DIAGNOSIS — F4310 Post-traumatic stress disorder, unspecified: Secondary | ICD-10-CM

## 2023-02-26 DIAGNOSIS — G4700 Insomnia, unspecified: Secondary | ICD-10-CM | POA: Diagnosis not present

## 2023-02-26 DIAGNOSIS — F419 Anxiety disorder, unspecified: Secondary | ICD-10-CM

## 2023-02-26 DIAGNOSIS — F3181 Bipolar II disorder: Secondary | ICD-10-CM | POA: Diagnosis not present

## 2023-02-26 MED ORDER — QUETIAPINE FUMARATE 25 MG PO TABS: 25.0000 mg | ORAL_TABLET | Freq: Every day | ORAL | 1 refills | Status: DC

## 2023-03-01 ENCOUNTER — Encounter: Payer: Self-pay | Admitting: Gastroenterology

## 2023-03-08 ENCOUNTER — Ambulatory Visit: Payer: Medicare PPO | Admitting: Anesthesiology

## 2023-03-08 ENCOUNTER — Other Ambulatory Visit: Payer: Self-pay

## 2023-03-08 ENCOUNTER — Ambulatory Visit
Admission: RE | Admit: 2023-03-08 | Discharge: 2023-03-08 | Disposition: A | Discharge status: Home / Self Care | Payer: Medicare PPO | Attending: Gastroenterology | Admitting: Gastroenterology

## 2023-03-08 ENCOUNTER — Encounter
Admission: RE | Disposition: A | Discharge status: Home / Self Care | Payer: Self-pay | Source: Home / Self Care | Attending: Gastroenterology

## 2023-03-08 ENCOUNTER — Encounter: Payer: Self-pay | Admitting: Gastroenterology

## 2023-03-08 DIAGNOSIS — K640 First degree hemorrhoids: Secondary | ICD-10-CM | POA: Diagnosis not present

## 2023-03-08 DIAGNOSIS — K219 Gastro-esophageal reflux disease without esophagitis: Secondary | ICD-10-CM | POA: Diagnosis not present

## 2023-03-08 DIAGNOSIS — Z1211 Encounter for screening for malignant neoplasm of colon: Secondary | ICD-10-CM | POA: Diagnosis not present

## 2023-03-08 DIAGNOSIS — K5730 Diverticulosis of large intestine without perforation or abscess without bleeding: Secondary | ICD-10-CM | POA: Diagnosis not present

## 2023-03-08 DIAGNOSIS — N183 Chronic kidney disease, stage 3 unspecified: Secondary | ICD-10-CM | POA: Diagnosis not present

## 2023-03-08 DIAGNOSIS — D128 Benign neoplasm of rectum: Secondary | ICD-10-CM | POA: Insufficient documentation

## 2023-03-08 DIAGNOSIS — Z860100 Personal history of colon polyps, unspecified: Secondary | ICD-10-CM

## 2023-03-08 DIAGNOSIS — F319 Bipolar disorder, unspecified: Secondary | ICD-10-CM | POA: Insufficient documentation

## 2023-03-08 DIAGNOSIS — F419 Anxiety disorder, unspecified: Secondary | ICD-10-CM | POA: Diagnosis not present

## 2023-03-08 DIAGNOSIS — I129 Hypertensive chronic kidney disease with stage 1 through stage 4 chronic kidney disease, or unspecified chronic kidney disease: Secondary | ICD-10-CM | POA: Diagnosis not present

## 2023-03-08 DIAGNOSIS — K635 Polyp of colon: Secondary | ICD-10-CM

## 2023-03-08 DIAGNOSIS — Z87891 Personal history of nicotine dependence: Secondary | ICD-10-CM | POA: Diagnosis not present

## 2023-03-08 DIAGNOSIS — Z09 Encounter for follow-up examination after completed treatment for conditions other than malignant neoplasm: Secondary | ICD-10-CM | POA: Diagnosis not present

## 2023-03-08 HISTORY — DX: Unspecified osteoarthritis, unspecified site: M19.90

## 2023-03-08 HISTORY — PX: COLONOSCOPY WITH PROPOFOL: SHX5780

## 2023-03-08 HISTORY — DX: Post-traumatic stress disorder, unspecified: F43.10

## 2023-03-08 HISTORY — PX: POLYPECTOMY: SHX5525

## 2023-03-08 SURGERY — COLONOSCOPY WITH PROPOFOL
Anesthesia: General

## 2023-03-08 MED ORDER — SODIUM CHLORIDE 0.9 % IV SOLN: INTRAVENOUS | Status: DC

## 2023-03-08 MED ORDER — PROPOFOL 10 MG/ML IV BOLUS
INTRAVENOUS | Status: DC | PRN
  Administered 2023-03-08: 20 mg via INTRAVENOUS
  Administered 2023-03-08: 30 mg via INTRAVENOUS
  Administered 2023-03-08 (×2): 20 mg via INTRAVENOUS
  Administered 2023-03-08: 40 mg via INTRAVENOUS
  Administered 2023-03-08: 50 mg via INTRAVENOUS
  Administered 2023-03-08: 30 mg via INTRAVENOUS
  Administered 2023-03-08 (×2): 50 mg via INTRAVENOUS
  Administered 2023-03-08: 20 mg via INTRAVENOUS
  Administered 2023-03-08: 70 mg via INTRAVENOUS
  Administered 2023-03-08 (×2): 30 mg via INTRAVENOUS

## 2023-03-08 MED ORDER — STERILE WATER FOR IRRIGATION IR SOLN
Status: DC | PRN
  Administered 2023-03-08: 500 mL

## 2023-03-08 MED ORDER — LIDOCAINE HCL (CARDIAC) PF 100 MG/5ML IV SOSY
PREFILLED_SYRINGE | INTRAVENOUS | Status: DC | PRN
  Administered 2023-03-08: 30 mg via INTRAVENOUS

## 2023-03-08 MED ORDER — DEXMEDETOMIDINE HCL IN NACL 200 MCG/50ML IV SOLN
INTRAVENOUS | Status: DC | PRN
  Administered 2023-03-08: 4 ug via INTRAVENOUS
  Administered 2023-03-08: 8 ug via INTRAVENOUS

## 2023-03-08 MED ORDER — LACTATED RINGERS IV SOLN: INTRAVENOUS | Status: DC

## 2023-03-08 SURGICAL SUPPLY — 21 items
CLIP HMST 235XBRD CATH ROT (MISCELLANEOUS) IMPLANT
CLIP RESOLUTION 360 11X235 (MISCELLANEOUS)
ELECT REM PT RETURN 9FT ADLT (ELECTROSURGICAL)
ELECTRODE REM PT RTRN 9FT ADLT (ELECTROSURGICAL) IMPLANT
FORCEPS BIOP RAD 4 LRG CAP 4 (CUTTING FORCEPS) IMPLANT
GOWN CVR UNV OPN BCK APRN NK (MISCELLANEOUS) ×4 IMPLANT
GOWN ISOL THUMB LOOP REG UNIV (MISCELLANEOUS) ×4
INJECTOR VARIJECT VIN23 (MISCELLANEOUS) IMPLANT
KIT DEFENDO VALVE AND CONN (KITS) IMPLANT
KIT PRC NS LF DISP ENDO (KITS) ×2 IMPLANT
KIT PROCEDURE OLYMPUS (KITS) ×2
MANIFOLD NEPTUNE II (INSTRUMENTS) ×2 IMPLANT
MARKER SPOT ENDO TATTOO 5ML (MISCELLANEOUS) IMPLANT
PROBE APC STR FIRE (PROBE) IMPLANT
RETRIEVER NET ROTH 2.5X230 LF (MISCELLANEOUS) IMPLANT
SNARE COLD EXACTO (MISCELLANEOUS) IMPLANT
SNARE SHORT THROW 13M SML OVAL (MISCELLANEOUS) IMPLANT
SNARE SNG USE RND 15MM (INSTRUMENTS) IMPLANT
TRAP ETRAP POLY (MISCELLANEOUS) IMPLANT
VARIJECT INJECTOR VIN23 (MISCELLANEOUS)
WATER STERILE IRR 250ML POUR (IV SOLUTION) ×2 IMPLANT

## 2023-03-08 NOTE — Anesthesia Postprocedure Evaluation (Signed)
Anesthesia Post Note  Patient: Elizabeth Malone  Procedure(s) Performed: COLONOSCOPY WITH PROPOFOL POLYPECTOMY  Patient location during evaluation: PACU Anesthesia Type: General Level of consciousness: awake and alert Pain management: pain level controlled Vital Signs Assessment: post-procedure vital signs reviewed and stable Respiratory status: spontaneous breathing, nonlabored ventilation, respiratory function stable and patient connected to nasal cannula oxygen Cardiovascular status: blood pressure returned to baseline and stable Postop Assessment: no apparent nausea or vomiting Anesthetic complications: no   No notable events documented.   Last Vitals:  Vitals:   03/08/23 0906 03/08/23 0907  BP:  (!) 97/54  Pulse: 73   Resp: (!) 21   Temp:  (!) 36.4 C  SpO2: 100%     Last Pain:  Vitals:   03/08/23 0907  TempSrc:   PainSc: 0-No pain                 Marisue Humble
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
## 2023-03-08 NOTE — Transfer of Care (Signed)
Immediate Anesthesia Transfer of Care Note  Patient: Elizabeth Malone  Procedure(s) Performed: COLONOSCOPY WITH PROPOFOL POLYPECTOMY  Patient Location: PACU  Anesthesia Type: General  Level of Consciousness: awake, alert  and patient cooperative  Airway and Oxygen Therapy: Patient Spontanous Breathing and Patient connected to supplemental oxygen  Post-op Assessment: Post-op Vital signs reviewed, Patient's Cardiovascular Status Stable, Respiratory Function Stable, Patent Airway and No signs of Nausea or vomiting  Post-op Vital Signs: Reviewed and stable  Complications: No notable events documented.
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
## 2023-03-08 NOTE — Op Note (Signed)
[facility]enter[facility] Patient Name: Elizabeth Malone Procedure Date: 03/08/2023 8:17 AM MRN: [id number] Account #: [id number] Date of Birth: May 23, 1951 Admit Type: Outpatient Age: 72 Room: [facility] OR ROOM 01 Gender: Female Note Status: Finalized Instrument Name: [id number] Procedure:             Colonoscopy Indications:           High risk colon cancer surveillance: Personal history                         of colonic polyps Providers:             Midge Minium MD, MD Referring MD:          Dorie Rank. Cannady (Referring MD) Medicines:             Propofol per Anesthesia Complications:         No immediate complications. Procedure:             Pre-Anesthesia Assessment:                        - Prior to the procedure, a History and Physical was                         performed, and patient medications and allergies were                         reviewed. The patient's tolerance of previous                         anesthesia was also reviewed. The risks and benefits                         of the procedure and the sedation options and risks                         were discussed with the patient. All questions were                         answered, and informed consent was obtained. Prior                         Anticoagulants: The patient has taken no anticoagulant                         or antiplatelet agents. ASA Grade Assessment: II - A                         patient with mild systemic disease. After reviewing                         the risks and benefits, the patient was deemed in                         satisfactory condition to undergo the procedure.                        After obtaining informed consent, the colonoscope was  passed under direct vision. Throughout the procedure,                         the patient's blood pressure, pulse, and oxygen                         saturations were monitored continuously. The                          Colonoscope was introduced through the anus and                         advanced to the the cecum, identified by appendiceal                         orifice and ileocecal valve. The colonoscopy was                         performed without difficulty. The patient tolerated                         the procedure well. The quality of the bowel                         preparation was adequate to identify polyps. Findings:      The perianal and digital rectal examinations were normal.      A few small-mouthed diverticula were found in the entire colon.      A 4 mm polyp was found in the ascending colon. The polyp was sessile.       The polyp was removed with a cold snare. Resection and retrieval were       complete.      A 3 mm polyp was found in the rectum. The polyp was sessile. The polyp       was removed with a cold snare. Resection and retrieval were complete.      Non-bleeding internal hemorrhoids were found during retroflexion. The       hemorrhoids were Grade I (internal hemorrhoids that do not prolapse). Impression:            - Diverticulosis in the entire examined colon.                        - One 4 mm polyp in the ascending colon, removed with                         a cold snare. Resected and retrieved.                        - One 3 mm polyp in the rectum, removed with a cold                         snare. Resected and retrieved.                        - Non-bleeding internal hemorrhoids. Recommendation:        - Discharge patient to home.                        -  Resume previous diet.                        - Continue present medications.                        - Await pathology results.                        - Repeat colonoscopy in 7 years for surveillance. Procedure Code(s):     --- Professional ---                        [phone], Colonoscopy, flexible; with removal of                         tumor(s), polyp(s), or other lesion(s) by snare                          technique Diagnosis Code(s):     --- Professional ---                        Z86.010, Personal history of colonic polyps                        D12.2, Benign neoplasm of ascending colon CPT copyright 2022 American Medical Association. All rights reserved. The codes documented in this report are preliminary and upon coder review may  be revised to meet current compliance requirements. Midge Minium MD, MD 03/08/2023 8:57:09 AM This report has been signed electronically. Number of Addenda: 0 Note Initiated On: 03/08/2023 8:17 AM Scope Withdrawal Time: 0 hours 11 minutes 24 seconds  Total Procedure Duration: 0 hours 26 minutes 1 second  Estimated Blood Loss:  Estimated blood loss: none.      [facility]

## 2023-03-09 ENCOUNTER — Encounter: Payer: Self-pay | Admitting: Gastroenterology

## 2023-03-10 ENCOUNTER — Ambulatory Visit: Payer: Medicare PPO | Admitting: Nurse Practitioner

## 2023-03-10 DIAGNOSIS — I10 Essential (primary) hypertension: Secondary | ICD-10-CM

## 2023-03-10 DIAGNOSIS — Z0000 Encounter for general adult medical examination without abnormal findings: Secondary | ICD-10-CM

## 2023-03-10 DIAGNOSIS — M85852 Other specified disorders of bone density and structure, left thigh: Secondary | ICD-10-CM

## 2023-03-10 DIAGNOSIS — F3175 Bipolar disorder, in partial remission, most recent episode depressed: Secondary | ICD-10-CM

## 2023-03-10 DIAGNOSIS — H02831 Dermatochalasis of right upper eyelid: Secondary | ICD-10-CM | POA: Diagnosis not present

## 2023-03-10 DIAGNOSIS — E782 Mixed hyperlipidemia: Secondary | ICD-10-CM

## 2023-03-10 DIAGNOSIS — R7309 Other abnormal glucose: Secondary | ICD-10-CM

## 2023-03-10 DIAGNOSIS — K219 Gastro-esophageal reflux disease without esophagitis: Secondary | ICD-10-CM

## 2023-03-10 DIAGNOSIS — H02834 Dermatochalasis of left upper eyelid: Secondary | ICD-10-CM | POA: Diagnosis not present

## 2023-03-10 DIAGNOSIS — F419 Anxiety disorder, unspecified: Secondary | ICD-10-CM

## 2023-03-10 DIAGNOSIS — Z87898 Personal history of other specified conditions: Secondary | ICD-10-CM

## 2023-03-11 ENCOUNTER — Encounter: Payer: Self-pay | Admitting: Gastroenterology

## 2023-03-23 ENCOUNTER — Telehealth: Payer: Medicare PPO

## 2023-03-26 ENCOUNTER — Telehealth: Payer: Medicare PPO

## 2023-03-26 ENCOUNTER — Ambulatory Visit (INDEPENDENT_AMBULATORY_CARE_PROVIDER_SITE_OTHER): Payer: Medicare PPO

## 2023-03-26 DIAGNOSIS — I10 Essential (primary) hypertension: Secondary | ICD-10-CM

## 2023-03-26 DIAGNOSIS — F3175 Bipolar disorder, in partial remission, most recent episode depressed: Secondary | ICD-10-CM

## 2023-03-26 DIAGNOSIS — F419 Anxiety disorder, unspecified: Secondary | ICD-10-CM

## 2023-03-26 DIAGNOSIS — Z87898 Personal history of other specified conditions: Secondary | ICD-10-CM
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
## 2023-03-27 DIAGNOSIS — R7309 Other abnormal glucose: Secondary | ICD-10-CM | POA: Insufficient documentation

## 2023-03-27 NOTE — Patient Instructions (Signed)
Be Involved in Caring For Your Health:  Taking Medications When medications are taken as directed, they can greatly improve your health. But if they are not taken as prescribed, they may not work. In some cases, not taking them correctly can be harmful. To help ensure your treatment remains effective and safe, understand your medications and how to take them. Bring your medications to each visit for review by your provider.  Your lab results, notes, and after visit summary will be available on My Chart. We strongly encourage you to use this feature. If lab results are abnormal the clinic will contact you with the appropriate steps. If the clinic does not contact you assume the results are satisfactory. You can always view your results on My Chart. If you have questions regarding your health or results, please contact the clinic during office hours. You can also ask questions on My Chart.  We at [facility] are grateful that you chose us to provide your care. We strive to provide evidence-based and compassionate care and are always looking for feedback. If you get a survey from the clinic please complete this so we can hear your opinions.  DASH Eating Plan DASH stands for Dietary Approaches to Stop Hypertension. The DASH eating plan is a healthy eating plan that has been shown to: Lower high blood pressure (hypertension). Reduce your risk for type 2 diabetes, heart disease, and stroke. Help with weight loss. What are tips for following this plan? Reading food labels Check food labels for the amount of salt (sodium) per serving. Choose foods with less than 5 percent of the Daily Value (DV) of sodium. In general, foods with less than 300 milligrams (mg) of sodium per serving fit into this eating plan. To find whole grains, look for the word "whole" as the first word in the ingredient list. Shopping Buy products labeled as "low-sodium" or "no salt added." Buy fresh foods. Avoid canned  foods and pre-made or frozen meals. Cooking Try not to add salt when you cook. Use salt-free seasonings or herbs instead of table salt or sea salt. Check with your health care provider or pharmacist before using salt substitutes. Do not fry foods. Cook foods in healthy ways, such as baking, boiling, grilling, roasting, or broiling. Cook using oils that are good for your heart. These include olive, canola, avocado, soybean, and sunflower oil. Meal planning  Eat a balanced diet. This should include: 4 or more servings of fruits and 4 or more servings of vegetables each day. Try to fill half of your plate with fruits and vegetables. 6-8 servings of whole grains each day. 6 or less servings of lean meat, poultry, or fish each day. 1 oz is 1 serving. A 3 oz (85 g) serving of meat is about the same size as the palm of your hand. One egg is 1 oz (28 g). 2-3 servings of low-fat dairy each day. One serving is 1 cup (237 mL). 1 serving of nuts, seeds, or beans 5 times each week. 2-3 servings of heart-healthy fats. Healthy fats called omega-3 fatty acids are found in foods such as walnuts, flaxseeds, fortified milks, and eggs. These fats are also found in cold-water fish, such as sardines, salmon, and mackerel. Limit how much you eat of: Canned or prepackaged foods. Food that is high in trans fat, such as fried foods. Food that is high in saturated fat, such as fatty meat. Desserts and other sweets, sugary drinks, and other foods with added sugar. Full-fat   dairy products. Do not salt foods before eating. Do not eat more than 4 egg yolks a week. Try to eat at least 2 vegetarian meals a week. Eat more home-cooked food and less restaurant, buffet, and fast food. Lifestyle When eating at a restaurant, ask if your food can be made with less salt or no salt. If you drink alcohol: Limit how much you have to: 0-1 drink a day if you are female. 0-2 drinks a day if you are female. Know how much alcohol is in  your drink. In the [place] one drink is one 12 oz bottle of beer (355 mL), one 5 oz glass of wine (148 mL), or one 1 oz glass of hard liquor (44 mL). General information Avoid eating more than 2,300 mg of salt a day. If you have hypertension, you may need to reduce your sodium intake to 1,500 mg a day. Work with your provider to stay at a healthy body weight or lose weight. Ask what the best weight range is for you. On most days of the week, get at least 30 minutes of exercise that causes your heart to beat faster. This may include walking, swimming, or biking. Work with your provider or dietitian to adjust your eating plan to meet your specific calorie needs. What foods should I eat? Fruits All fresh, dried, or frozen fruit. Canned fruits that are in their natural juice and do not have sugar added to them. Vegetables Fresh or frozen vegetables that are raw, steamed, roasted, or grilled. Low-sodium or reduced-sodium tomato and vegetable juice. Low-sodium or reduced-sodium tomato sauce and tomato paste. Low-sodium or reduced-sodium canned vegetables. Grains Whole-grain or whole-wheat bread. Whole-grain or whole-wheat pasta. Brown rice. Oatmeal. Quinoa. Bulgur. Whole-grain and low-sodium cereals. Pita bread. Low-fat, low-sodium crackers. Whole-wheat flour tortillas. Meats and other proteins Skinless chicken or turkey. Ground chicken or turkey. Pork with fat trimmed off. Fish and seafood. Egg whites. Dried beans, peas, or lentils. Unsalted nuts, nut butters, and seeds. Unsalted canned beans. Lean cuts of beef with fat trimmed off. Low-sodium, lean precooked or cured meat, such as sausages or meat loaves. Dairy Low-fat (1%) or fat-free (skim) milk. Reduced-fat, low-fat, or fat-free cheeses. Nonfat, low-sodium ricotta or cottage cheese. Low-fat or nonfat yogurt. Low-fat, low-sodium cheese. Fats and oils Soft margarine without trans fats. Vegetable oil. Reduced-fat, low-fat, or light mayonnaise and salad  dressings (reduced-sodium). Canola, safflower, olive, avocado, soybean, and sunflower oils. Avocado. Seasonings and condiments Herbs. Spices. Seasoning mixes without salt. Other foods Unsalted popcorn and pretzels. Fat-free sweets. The items listed above may not be all the foods and drinks you can have. Talk to a dietitian to learn more. What foods should I avoid? Fruits Canned fruit in a light or heavy syrup. Fried fruit. Fruit in cream or butter sauce. Vegetables Creamed or fried vegetables. Vegetables in a cheese sauce. Regular canned vegetables that are not marked as low-sodium or reduced-sodium. Regular canned tomato sauce and paste that are not marked as low-sodium or reduced-sodium. Regular tomato and vegetable juices that are not marked as low-sodium or reduced-sodium. Pickles. Olives. Grains Baked goods made with fat, such as croissants, muffins, or some breads. Dry pasta or rice meal packs. Meats and other proteins Fatty cuts of meat. Ribs. Fried meat. Bacon. Bologna, salami, and other precooked or cured meats, such as sausages or meat loaves, that are not lean and low in sodium. Fat from the back of a pig (fatback). Bratwurst. Salted nuts and seeds. Canned beans with added salt. Canned   or smoked fish. Whole eggs or egg yolks. Chicken or turkey with skin. Dairy Whole or 2% milk, cream, and half-and-half. Whole or full-fat cream cheese. Whole-fat or sweetened yogurt. Full-fat cheese. Nondairy creamers. Whipped toppings. Processed cheese and cheese spreads. Fats and oils Butter. Stick margarine. Lard. Shortening. Ghee. Bacon fat. Tropical oils, such as coconut, palm kernel, or palm oil. Seasonings and condiments Onion salt, garlic salt, seasoned salt, table salt, and sea salt. Worcestershire sauce. Tartar sauce. Barbecue sauce. Teriyaki sauce. Soy sauce, including reduced-sodium soy sauce. Steak sauce. Canned and packaged gravies. Fish sauce. Oyster sauce. Cocktail sauce. Store-bought  horseradish. Ketchup. Mustard. Meat flavorings and tenderizers. Bouillon cubes. Hot sauces. Pre-made or packaged marinades. Pre-made or packaged taco seasonings. Relishes. Regular salad dressings. Other foods Salted popcorn and pretzels. The items listed above may not be all the foods and drinks you should avoid. Talk to a dietitian to learn more. Where to find more information National Heart, Lung, and Blood Institute (NHLBI): nhlbi.nih.gov American Heart Association (AHA): heart.org Academy of Nutrition and Dietetics: eatright.org National Kidney Foundation (NKF): kidney.org This information is not intended to replace advice given to you by your health care provider. Make sure you discuss any questions you have with your health care provider. Document Revised: 09/17/2022 Document Reviewed: 09/17/2022 Elsevier Patient Education  2024 [employer].  

## 2023-03-29 ENCOUNTER — Ambulatory Visit: Payer: Medicare PPO | Admitting: Nurse Practitioner

## 2023-03-29 ENCOUNTER — Encounter: Payer: Self-pay | Admitting: Nurse Practitioner

## 2023-03-29 VITALS — BP 101/66 | HR 64 | Temp 97.9°F | Ht 65.32 in | Wt 193.0 lb

## 2023-03-29 DIAGNOSIS — K219 Gastro-esophageal reflux disease without esophagitis: Secondary | ICD-10-CM

## 2023-03-29 DIAGNOSIS — I10 Essential (primary) hypertension: Secondary | ICD-10-CM

## 2023-03-29 DIAGNOSIS — Z6831 Body mass index (BMI) 31.0-31.9, adult: Secondary | ICD-10-CM

## 2023-03-29 DIAGNOSIS — M85852 Other specified disorders of bone density and structure, left thigh: Secondary | ICD-10-CM | POA: Diagnosis not present

## 2023-03-29 DIAGNOSIS — Z0000 Encounter for general adult medical examination without abnormal findings: Secondary | ICD-10-CM

## 2023-03-29 DIAGNOSIS — E782 Mixed hyperlipidemia: Secondary | ICD-10-CM | POA: Diagnosis not present

## 2023-03-29 DIAGNOSIS — E6609 Other obesity due to excess calories: Secondary | ICD-10-CM

## 2023-03-29 DIAGNOSIS — F419 Anxiety disorder, unspecified: Secondary | ICD-10-CM

## 2023-03-29 DIAGNOSIS — R7309 Other abnormal glucose: Secondary | ICD-10-CM

## 2023-03-29 DIAGNOSIS — G5793 Unspecified mononeuropathy of bilateral lower limbs: Secondary | ICD-10-CM | POA: Diagnosis not present

## 2023-03-29 DIAGNOSIS — Z23 Encounter for immunization: Secondary | ICD-10-CM

## 2023-03-29 DIAGNOSIS — F3175 Bipolar disorder, in partial remission, most recent episode depressed: Secondary | ICD-10-CM

## 2023-03-29 DIAGNOSIS — Z87898 Personal history of other specified conditions: Secondary | ICD-10-CM

## 2023-03-29 LAB — BAYER DCA HB A1C WAIVED: HB A1C (BAYER DCA - WAIVED): 5.3 % (ref 4.8–5.6)

## 2023-03-29 LAB — MICROALBUMIN, URINE WAIVED
Creatinine, Urine Waived: 50 mg/dL (ref 10–300)
Microalb, Ur Waived: 30 mg/L — ABNORMAL HIGH (ref 0–19)

## 2023-03-29 NOTE — Assessment & Plan Note (Signed)
Chronic, stable with BP well below goal.  Could consider medication reduction in future if remains below goal.  Recommend she monitor BP at least a few mornings a week at home and document.  DASH diet at home.  Continue current medication regimen and adjust as needed.  Labs today: CBC, CMP, TSH, urine ALB. Urine ALB 13 April 2023, maintain ACE on board.
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
## 2023-03-29 NOTE — Assessment & Plan Note (Signed)
Present for over a month -- no longer drinking alcohol.  Taking B12 supplement daily, will change to [facility] and see if further benefit.  Discussed at length with her.  Obtain imaging of lumbar spine and referral to neurology to further test lower extremities due to neuropathy discomfort and increased falls with this recently.  Discussed plan with patient at length.

## 2023-03-29 NOTE — Assessment & Plan Note (Signed)
BMI 31.81.  Recommended eating smaller high protein, low fat meals more frequently and exercising 30 mins a day 5 times a week with a goal of 10-15lb weight loss in the next 3 months. Patient voiced their understanding and motivation to adhere to these recommendations.  

## 2023-03-29 NOTE — Assessment & Plan Note (Signed)
Refer to Bipolar plan of care. 

## 2023-03-29 NOTE — Progress Notes (Signed)
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 near bedding or upholstery.   Dental health: Discussed importance of regular tooth brushing, flossing, and dental visits.    NEXT PREVENTATIVE PHYSICAL DUE IN 1 YEAR. Return in about 6 months (around 09/29/2023) for HTN/HLD, MOOD, NEUROPATHY.

## 2023-03-29 NOTE — Assessment & Plan Note (Signed)
Chronic, ongoing, noted on DEXA 08/27/2019.  Recommend continue daily Calcium and Vitamin D supplement.  Weight bearing exercises at home.  Check Vit D today.  Repeat DEXA around 08/26/2024.

## 2023-03-29 NOTE — Assessment & Plan Note (Signed)
Noted on past labs with A1c today stable at 5.3%.  Recommend she continue diet and exercise focus at home.

## 2023-03-29 NOTE — Assessment & Plan Note (Signed)
Chronic, ongoing.  Continue current medication regimen at this time.  Risks of PPI use were discussed with patient including bone loss, C. Diff diarrhea, pneumonia, infections, CKD, electrolyte abnormalities. Verbalizes understanding and chooses to continue the medication.

## 2023-03-29 NOTE — Assessment & Plan Note (Signed)
 Chronic, ongoing.  Continue current medication regimen and adjust as needed.  Check labs today.

## 2023-03-30 ENCOUNTER — Other Ambulatory Visit: Payer: Self-pay | Admitting: Nurse Practitioner

## 2023-03-30 LAB — COMPREHENSIVE METABOLIC PANEL
ALT: 27 IU/L (ref 0–32)
AST: 26 IU/L (ref 0–40)
Albumin: 4.5 g/dL (ref 3.8–4.8)
Alkaline Phosphatase: 85 IU/L (ref 44–121)
BUN/Creatinine Ratio: 17 (ref 12–28)
BUN: 24 mg/dL (ref 8–27)
Bilirubin Total: 0.4 mg/dL (ref 0.0–1.2)
CO2: 22 mmol/L (ref 20–29)
Calcium: 10.2 mg/dL (ref 8.7–10.3)
Chloride: 104 mmol/L (ref 96–106)
Creatinine, Ser: 1.43 mg/dL — ABNORMAL HIGH (ref 0.57–1.00)
Globulin, Total: 2.5 g/dL (ref 1.5–4.5)
Glucose: 94 mg/dL (ref 70–99)
Potassium: 4.7 mmol/L (ref 3.5–5.2)
Sodium: 140 mmol/L (ref 134–144)
Total Protein: 7 g/dL (ref 6.0–8.5)
eGFR: 39 mL/min/{1.73_m2} — ABNORMAL LOW (ref >59)

## 2023-03-30 LAB — CBC WITH DIFFERENTIAL/PLATELET
Basophils Absolute: 0 10*3/uL (ref 0.0–0.2)
Basos: 1 %
EOS (ABSOLUTE): 0.2 10*3/uL (ref 0.0–0.4)
Eos: 3 %
Hematocrit: 36.6 % (ref 34.0–46.6)
Hemoglobin: 12.1 g/dL (ref 11.1–15.9)
Immature Grans (Abs): 0 10*3/uL (ref 0.0–0.1)
Immature Granulocytes: 0 %
Lymphocytes Absolute: 2 10*3/uL (ref 0.7–3.1)
Lymphs: 36 %
MCH: 29.6 pg (ref 26.6–33.0)
MCHC: 33.1 g/dL (ref 31.5–35.7)
MCV: 90 fL (ref 79–97)
Monocytes Absolute: 0.4 10*3/uL (ref 0.1–0.9)
Monocytes: 8 %
Neutrophils Absolute: 3 10*3/uL (ref 1.4–7.0)
Neutrophils: 52 %
Platelets: 251 10*3/uL (ref 150–450)
RBC: 4.09 x10E6/uL (ref 3.77–5.28)
RDW: 12.5 % (ref 11.7–15.4)
WBC: 5.6 10*3/uL (ref 3.4–10.8)

## 2023-03-30 LAB — LIPID PANEL W/O CHOL/HDL RATIO
Cholesterol, Total: 149 mg/dL (ref 100–199)
HDL: 51 mg/dL (ref >39)
LDL Chol Calc (NIH): 73 mg/dL (ref 0–99)
Triglycerides: 147 mg/dL (ref 0–149)
VLDL Cholesterol Cal: 25 mg/dL (ref 5–40)

## 2023-03-30 LAB — VITAMIN D 25 HYDROXY (VIT D DEFICIENCY, FRACTURES): Vit D, 25-Hydroxy: 35.4 ng/mL (ref 30.0–100.0)

## 2023-03-30 LAB — MAGNESIUM: Magnesium: 2.1 mg/dL (ref 1.6–2.3)

## 2023-03-30 LAB — TSH: TSH: 1.08 u[IU]/mL (ref 0.450–4.500)

## 2023-03-30 MED ORDER — BENAZEPRIL HCL 10 MG PO TABS
10.0000 mg | ORAL_TABLET | Freq: Every day | ORAL | 4 refills | Status: DC
Start: 2023-03-30 — End: 2023-07-13

## 2023-03-30 NOTE — Progress Notes (Signed)
Contacted via MyChart -- but please call to ensure she viewed these + needs lab only visit in 2 weeks outpatient: Good morning Elizabeth Malone, your labs have returned and overall they looked great with one exception.  Your kidney function, creatinine and eGFR, has dropped a lot -- prior had been stable and normal, but currently showing my kidney disease stage 3b.  I am concerned about this and some acute kidney issues.  I want to recheck this in 2 weeks and want you to ensure to push fluids, lots of water, over next two weeks.  Avoid Ibuprofen products.  If any alcohol use reduce this.  Overall we want to ensure the kidneys are okay.  Any questions about this?  If ongoing on recheck in 2 weeks then we will further delve into what is happening.  My staff will call to schedule lab only visit. Keep being amazing!!  Thank you for allowing me to participate in your care.  I appreciate you. Kindest regards, Fredricka Kohrs

## 2023-03-31 ENCOUNTER — Ambulatory Visit
Admission: RE | Admit: 2023-03-31 | Discharge: 2023-03-31 | Disposition: A | Discharge status: Home / Self Care | Payer: Medicare PPO | Attending: Nurse Practitioner | Admitting: Nurse Practitioner

## 2023-03-31 ENCOUNTER — Ambulatory Visit: Admission: RE | Admit: 2023-03-31 | Payer: Medicare PPO | Source: Ambulatory Visit

## 2023-03-31 DIAGNOSIS — G5793 Unspecified mononeuropathy of bilateral lower limbs: Secondary | ICD-10-CM | POA: Insufficient documentation

## 2023-03-31 DIAGNOSIS — M4317 Spondylolisthesis, lumbosacral region: Secondary | ICD-10-CM | POA: Diagnosis not present

## 2023-03-31 DIAGNOSIS — M47816 Spondylosis without myelopathy or radiculopathy, lumbar region: Secondary | ICD-10-CM | POA: Diagnosis not present

## 2023-04-09 NOTE — Progress Notes (Signed)
Contacted via MyChart   Good afternoon Rindi, your imaging has returned.  This does show moderate to mild arthritic changes to lower back which could be cause of neuropathy discomfort in lower legs if there is any pressure of nerves.  We could try some physical therapy to see if this helps symptoms, would you like to try this?

## 2023-04-11 ENCOUNTER — Encounter: Payer: Self-pay | Admitting: Nurse Practitioner

## 2023-04-12 ENCOUNTER — Other Ambulatory Visit: Payer: Self-pay | Admitting: Nurse Practitioner

## 2023-04-12 ENCOUNTER — Telehealth: Payer: Self-pay | Admitting: Nurse Practitioner

## 2023-04-12 DIAGNOSIS — N1831 Chronic kidney disease, stage 3a: Secondary | ICD-10-CM

## 2023-04-12 NOTE — Telephone Encounter (Signed)
Lab orders per patient have been faxed to [facility] in [place], [place] fax number [phone]
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
## 2023-04-12 NOTE — Telephone Encounter (Signed)
Pt is calling to ask  if she can get orders be placed at [facility] in [place], [place]  [phone]

## 2023-04-13 ENCOUNTER — Other Ambulatory Visit: Payer: Medicare PPO

## 2023-04-14 DIAGNOSIS — F319 Bipolar disorder, unspecified: Secondary | ICD-10-CM | POA: Diagnosis not present

## 2023-04-14 DIAGNOSIS — I10 Essential (primary) hypertension: Secondary | ICD-10-CM

## 2023-04-14 DIAGNOSIS — Z01818 Encounter for other preprocedural examination: Secondary | ICD-10-CM | POA: Diagnosis not present

## 2023-04-15 ENCOUNTER — Other Ambulatory Visit: Payer: Self-pay | Admitting: Nurse Practitioner

## 2023-04-15 DIAGNOSIS — N179 Acute kidney failure, unspecified: Secondary | ICD-10-CM

## 2023-04-15 NOTE — Progress Notes (Signed)
Contacted via MyChart -- lab only visit in 2 weeks needed:)  Good morning Aeisha, your kidney function is slowly trending up.  Have you been getting good hydration in, water intake?  No alcohol use, correct? Any Ibuprofen use?  Your potassium level is a little high this check, I need you to reduce your potassium intake -- foods like bananas, mangoes, dried fruit, potatoes.  I would like to recheck all of this in 2 weeks via outpatient labs again.  During that time reduce potassium intake and drink lots of water + try to cut back on Omeprazole to every other day & avoid and Ibuprofen.  Any questions? Keep being amazing!!  Thank you for allowing me to participate in your care.  I appreciate you. Kindest regards, Jerritt Cardoza

## 2023-04-15 NOTE — Progress Notes (Addendum)
Called patient to try and get her scheduled for labs in 2 weeks, she stated that she would probably still be out of town then and if you could send the lab order like you did for her yesterday to [facility] [address] [place], [place] [address] phone number [phone].  She stated that she has a telehealth visit at 1:20 pm for BP, but I am looking and it is actually for a MyChart Video Visit on that day.  She wanted to know if she really needed that appointment as she needs to save as much money as possible and if she could just check her BP and send a message to you letting you know what it is.  Please advise.

## 2023-04-16 ENCOUNTER — Encounter: Payer: Self-pay | Admitting: Psychiatry

## 2023-04-16 ENCOUNTER — Telehealth (INDEPENDENT_AMBULATORY_CARE_PROVIDER_SITE_OTHER): Payer: Medicare PPO | Admitting: Psychiatry

## 2023-04-16 DIAGNOSIS — F3181 Bipolar II disorder: Secondary | ICD-10-CM

## 2023-04-16 DIAGNOSIS — F419 Anxiety disorder, unspecified: Secondary | ICD-10-CM

## 2023-04-16 DIAGNOSIS — G4700 Insomnia, unspecified: Secondary | ICD-10-CM | POA: Diagnosis not present

## 2023-04-16 DIAGNOSIS — F4310 Post-traumatic stress disorder, unspecified: Secondary | ICD-10-CM

## 2023-04-16 MED ORDER — QUETIAPINE FUMARATE 25 MG PO TABS: 25.0000 mg | ORAL_TABLET | Freq: Every day | ORAL | 0 refills | Status: DC

## 2023-04-16 NOTE — Patient Instructions (Signed)
Continue venlafaxine 225 mg daily Continue quetiapine 325 mg at night  Continue Buspar 20 mg three times a day Next appointment- 9/20 at 8 30

## 2023-04-16 NOTE — Progress Notes (Signed)
Spoke with patient and informed her of cancelling the appointment and that we will send future orders for labs while she is still out of town.  She stated that she has checked her BP and that it was reading 125/75.  Spoke psychiatrist the last test for her kidneys showed improvement but if it got worse then she could change her to another antidepressant so it will not effect kidney function.

## 2023-04-23 DIAGNOSIS — M19012 Primary osteoarthritis, left shoulder: Secondary | ICD-10-CM | POA: Diagnosis not present

## 2023-04-26 ENCOUNTER — Telehealth: Payer: Self-pay | Admitting: Nurse Practitioner

## 2023-04-26 NOTE — Telephone Encounter (Signed)
Copied from CRM [id number]. Topic: General - Other >> Apr 26, 2023  1:21 PM [place] B wrote: Reason for CRM: pt called in just wanted Dr Harvest Dark to know her bp was 117/67 when checked with Ortho Dr

## 2023-04-26 NOTE — Telephone Encounter (Signed)
FYI

## 2023-04-26 NOTE — Telephone Encounter (Signed)
pt called in states Dr Harvest Dark, wants her to schedule an apt to reread her Kidney lab results, but she says she is taking methyiprednisolone, and not sure if she wants her to wait for the appt while she is on this med for her kidneys. Please call back

## 2023-04-28 ENCOUNTER — Ambulatory Visit: Payer: Medicare PPO | Admitting: Nurse Practitioner

## 2023-04-28 ENCOUNTER — Telehealth: Payer: Medicare PPO | Admitting: Nurse Practitioner

## 2023-04-29 DIAGNOSIS — R200 Anesthesia of skin: Secondary | ICD-10-CM | POA: Diagnosis not present

## 2023-04-29 DIAGNOSIS — F439 Reaction to severe stress, unspecified: Secondary | ICD-10-CM | POA: Diagnosis not present

## 2023-04-29 DIAGNOSIS — R202 Paresthesia of skin: Secondary | ICD-10-CM | POA: Diagnosis not present

## 2023-04-29 DIAGNOSIS — R079 Chest pain, unspecified: Secondary | ICD-10-CM | POA: Diagnosis not present

## 2023-05-05 ENCOUNTER — Other Ambulatory Visit: Payer: Self-pay | Admitting: Psychiatry

## 2023-05-05 DIAGNOSIS — F3175 Bipolar disorder, in partial remission, most recent episode depressed: Secondary | ICD-10-CM

## 2023-05-05 DIAGNOSIS — F3181 Bipolar II disorder: Secondary | ICD-10-CM

## 2023-05-05 DIAGNOSIS — F419 Anxiety disorder, unspecified: Secondary | ICD-10-CM

## 2023-05-07 ENCOUNTER — Other Ambulatory Visit: Payer: Medicare PPO

## 2023-05-07 ENCOUNTER — Other Ambulatory Visit: Payer: Self-pay
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
## 2023-05-11 ENCOUNTER — Ambulatory Visit: Payer: Self-pay | Admitting: *Deleted

## 2023-05-11 NOTE — Telephone Encounter (Signed)
Per agent: "Pt is calling in because she has been diagnosed with Kidney Disease and pt wants to know if the symptoms she is experiencing are being caused from her kidneys or are they signs of food poisoning. Pt says she is experiencing diarrhea,  and abdominal cramping."   Chief Complaint: Cramping, diarrhea Symptoms: abdominal cramping, 1 episode of watery diarrhea, chills Frequency: Today Pertinent Negatives: Patient denies nausea, vomiting Disposition: [] ED /[] Urgent Care (no appt availability in office) / [] Appointment(In office/virtual)/ []  [facility]/ [x] Home Care/ [] Refused Recommended Disposition /[] [facility]/ [x]  Follow-up with PCP Additional Notes:  Pt states ate out at restaurant  symptoms started about 5 hours after. No one else in party has symptoms but they ate different foods. States heating pad helps cramping. States is staying hydrated. Calling as she is concerned symptoms are related to her kidney disease. Advised on usual symptoms of kidney disease but PCP is best resource. Assured pt NT would route to practice for PCPs review. Pt states "If she has anything to add she can note in MyChart. Care advise provided, ED for worsening symptoms. Pt verbalizes understanding.  After hours call. Reason for Disposition  MILD-MODERATE diarrhea (e.g., 1-6 times / day more than normal)  Answer Assessment - Initial Assessment Questions 1. DIARRHEA SEVERITY: "How bad is the diarrhea?" "How many more stools have you had in the past 24 hours than normal?"    - NO DIARRHEA (SCALE 0)   - MILD (SCALE 1-3): Few loose or mushy BMs; increase of 1-3 stools over normal daily number of stools; mild increase in ostomy output.   -  MODERATE (SCALE 4-7): Increase of 4-6 stools daily over normal; moderate increase in ostomy output.   -  SEVERE (SCALE 8-10; OR "WORST POSSIBLE"): Increase of 7 or more stools daily over normal; moderate increase in ostomy output; incontinence.     1  episode 2. ONSET: "When did the diarrhea begin?"      Today 3. BM CONSISTENCY: "How loose or watery is the diarrhea?"      Watery 4. VOMITING: "Are you also vomiting?" If Yes, ask: "How many times in the past 24 hours?"      No 5. ABDOMEN PAIN: "Are you having any abdomen pain?" If Yes, ask: "What does it feel like?" (e.g., crampy, dull, intermittent, constant)      Cramping comes and goes, severe, chills 6. ABDOMEN PAIN SEVERITY: If present, ask: "How bad is the pain?"  (e.g., Scale 1-10; mild, moderate, or severe)   - MILD (1-3): doesn't interfere with normal activities, abdomen soft and not tender to touch    - MODERATE (4-7): interferes with normal activities or awakens from sleep, abdomen tender to touch    - SEVERE (8-10): excruciating pain, doubled over, unable to do any normal activities       Varies 7. ORAL INTAKE: If vomiting, "Have you been able to drink liquids?" "How much liquids have you had in the past 24 hours?"     NA 8. HYDRATION: "Any signs of dehydration?" (e.g., dry mouth [not just dry lips], too weak to stand, dizziness, new weight loss) "When did you last urinate?"     No. Drinking well 9. EXPOSURE: "Have you traveled to a foreign country recently?" "Have you been exposed to anyone with diarrhea?" "Could you have eaten any food that was spoiled?"     Maybe food bad 10. ANTIBIOTIC USE: "Are you taking antibiotics now or have you taken antibiotics in the  past 2 months?"       No 11. OTHER SYMPTOMS: "Do you have any other symptoms?" (e.g., fever, blood in stool)       No  Protocols used: Diarrhea-A-AH

## 2023-05-12 NOTE — Telephone Encounter (Signed)
Patient notified of Jolene's message.   °

## 2023-05-12 NOTE — Telephone Encounter (Signed)
Routing to provider. Patient requesting mychart message to be sent if Jolene has any information to give her regarding this.

## 2023-05-18 ENCOUNTER — Ambulatory Visit: Payer: Self-pay

## 2023-05-18 ENCOUNTER — Telehealth: Payer: Self-pay

## 2023-05-18 DIAGNOSIS — F419 Anxiety disorder, unspecified: Secondary | ICD-10-CM

## 2023-05-18 MED ORDER — HYDROXYZINE HCL 10 MG PO TABS
10.0000 mg | ORAL_TABLET | Freq: Two times a day (BID) | ORAL | 0 refills | Status: DC | PRN
Start: 2023-05-18 — End: 2023-05-27

## 2023-05-18 NOTE — Telephone Encounter (Signed)
I can start a low dose of hydroxyzine as needed for anxiety.  I will recommend that patient have a visit with Dr. Vanetta Shawl sooner.   Also if she is going through divorce and other situational stressors she will need psychotherapy sessions.  Please verify with patient if she already has a therapist if not please give her resources in the community to establish care. Please let me know.

## 2023-05-18 NOTE — Telephone Encounter (Signed)
pt left a message that she is in a dark place and having alot of anxiety and depression. she states that she is separtated and going threw a divorce and she needs something to help her.. pt was last seen on 8-02 next appt 09-20

## 2023-05-18 NOTE — Telephone Encounter (Signed)
Patient called, left VM to return the call to the office to speak to the NT.    Summary: requesting antibiotics   Pt is in with dental office, requesting and antibiotic (augmentine 875 mg )be sent to [facility] in [place] for her to pickup to take before eher teeth cleaning 1 hour before and 6 hours after. She is trying to get the cleaning done today, since she is already at the office. [id number] [place], [place] - [address] AT [facility] OF [address] [facility] Phone: [phone] Fax: [phone]

## 2023-05-18 NOTE — Telephone Encounter (Signed)
Pt.notified

## 2023-05-18 NOTE — Telephone Encounter (Signed)
Second attempt to reach pt. Left message to call back. 

## 2023-05-18 NOTE — Telephone Encounter (Signed)
I have sent the hydroxyzine to pharmacy.

## 2023-05-18 NOTE — Telephone Encounter (Signed)
Third attempt to reach pt. Left message to call back. 

## 2023-05-18 NOTE — Telephone Encounter (Signed)
pt states that woud be good please send to the [facility] in [place].
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
## 2023-05-27 ENCOUNTER — Encounter: Payer: Self-pay | Admitting: Psychiatry

## 2023-05-27 ENCOUNTER — Encounter: Payer: Self-pay | Admitting: Nurse Practitioner

## 2023-05-27 ENCOUNTER — Telehealth (INDEPENDENT_AMBULATORY_CARE_PROVIDER_SITE_OTHER): Payer: Medicare PPO | Admitting: Psychiatry

## 2023-05-27 DIAGNOSIS — F3181 Bipolar II disorder: Secondary | ICD-10-CM | POA: Diagnosis not present

## 2023-05-27 DIAGNOSIS — F419 Anxiety disorder, unspecified: Secondary | ICD-10-CM

## 2023-05-27 DIAGNOSIS — F4310 Post-traumatic stress disorder, unspecified: Secondary | ICD-10-CM | POA: Diagnosis not present

## 2023-05-27 MED ORDER — QUETIAPINE FUMARATE 300 MG PO TABS: 300.0000 mg | ORAL_TABLET | Freq: Every day | ORAL | 0 refills | Status: DC

## 2023-05-27 MED ORDER — HYDROXYZINE HCL 10 MG PO TABS
10.0000 mg | ORAL_TABLET | Freq: Every day | ORAL | 0 refills | Status: DC | PRN
Start: 2023-05-27 — End: 2023-07-05

## 2023-05-27 MED ORDER — QUETIAPINE FUMARATE 300 MG PO TABS
300.0000 mg | ORAL_TABLET | Freq: Every day | ORAL | 0 refills | Status: DC
Start: 2023-05-27 — End: 2023-08-09

## 2023-05-27 MED ORDER — QUETIAPINE FUMARATE 50 MG PO TABS: 50.0000 mg | ORAL_TABLET | Freq: Every day | ORAL | 0 refills | Status: DC

## 2023-05-27 NOTE — Patient Instructions (Signed)
Continue venlafaxine 225 mg daily Increase quetiapine 350 mg at night  Continue Buspar 20 mg three times a day  Next appointment- 11/15 8:30

## 2023-06-04 ENCOUNTER — Telehealth: Payer: Medicare PPO | Admitting: Psychiatry

## 2023-06-10 ENCOUNTER — Other Ambulatory Visit: Payer: Self-pay

## 2023-06-10 ENCOUNTER — Other Ambulatory Visit: Payer: Medicare PPO
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
## 2023-06-24 DIAGNOSIS — R8290 Unspecified abnormal findings in urine: Secondary | ICD-10-CM | POA: Diagnosis not present

## 2023-06-24 DIAGNOSIS — I10 Essential (primary) hypertension: Secondary | ICD-10-CM | POA: Diagnosis not present

## 2023-06-24 DIAGNOSIS — N1832 Chronic kidney disease, stage 3b: Secondary | ICD-10-CM | POA: Diagnosis not present

## 2023-06-29 ENCOUNTER — Other Ambulatory Visit: Payer: Self-pay | Admitting: Nephrology

## 2023-06-29 ENCOUNTER — Encounter (INDEPENDENT_AMBULATORY_CARE_PROVIDER_SITE_OTHER): Payer: Self-pay

## 2023-06-29 DIAGNOSIS — N1832 Chronic kidney disease, stage 3b: Secondary | ICD-10-CM

## 2023-07-02 ENCOUNTER — Other Ambulatory Visit: Payer: Self-pay | Admitting: Nurse Practitioner

## 2023-07-02 ENCOUNTER — Other Ambulatory Visit: Payer: Self-pay

## 2023-07-02 ENCOUNTER — Other Ambulatory Visit: Payer: Self-pay | Admitting: *Deleted

## 2023-07-02 DIAGNOSIS — R7309 Other abnormal glucose: Secondary | ICD-10-CM

## 2023-07-02 DIAGNOSIS — E785 Hyperlipidemia, unspecified: Secondary | ICD-10-CM

## 2023-07-02 DIAGNOSIS — I10 Essential (primary) hypertension: Secondary | ICD-10-CM

## 2023-07-02 DIAGNOSIS — M25512 Pain in left shoulder: Secondary | ICD-10-CM | POA: Diagnosis not present
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
## 2023-07-05 ENCOUNTER — Telehealth (INDEPENDENT_AMBULATORY_CARE_PROVIDER_SITE_OTHER): Payer: Medicare PPO | Admitting: Psychiatry

## 2023-07-05 ENCOUNTER — Encounter: Payer: Self-pay | Admitting: Psychiatry

## 2023-07-05 ENCOUNTER — Other Ambulatory Visit: Payer: Self-pay | Admitting: Psychiatry

## 2023-07-05 ENCOUNTER — Telehealth: Payer: Self-pay

## 2023-07-05 DIAGNOSIS — F419 Anxiety disorder, unspecified: Secondary | ICD-10-CM

## 2023-07-05 DIAGNOSIS — F3181 Bipolar II disorder: Secondary | ICD-10-CM | POA: Diagnosis not present

## 2023-07-05 DIAGNOSIS — F4310 Post-traumatic stress disorder, unspecified: Secondary | ICD-10-CM | POA: Diagnosis not present

## 2023-07-05 MED ORDER — HYDROXYZINE HCL 25 MG PO TABS: 25.0000 mg | ORAL_TABLET | Freq: Every day | ORAL | 0 refills | Status: AC | PRN

## 2023-07-05 MED ORDER — BUSPIRONE HCL 10 MG PO TABS: 20.0000 mg | ORAL_TABLET | Freq: Three times a day (TID) | ORAL | 0 refills | Status: AC

## 2023-07-05 MED ORDER — BUPROPION HCL ER (XL) 150 MG PO TB24
150.0000 mg | ORAL_TABLET | Freq: Every day | ORAL | 0 refills | Status: DC
Start: 2023-07-05 — End: 2023-07-28

## 2023-07-05 NOTE — Patient Instructions (Addendum)
Continue venlafaxine 225 mg daily Continue quetiapine 350 mg at night   Start bupropion 150 mg daily  Increase hydroxyzine 25 mg daily as needed for anxiety Increase Buspar 20 mg three times a day Next appointment- 11/14 at 3 pm
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
## 2023-07-05 NOTE — Telephone Encounter (Signed)
She is scheduled to have a visit later today. Will plan to address this at her visit.

## 2023-07-05 NOTE — Telephone Encounter (Signed)
pt states that she needs something she can not stop crying and that she is having a lot of anxiey and stress. pt is not sucidal. pt last seen on 9-12 next appt 11-15. [facility] [employer]

## 2023-07-05 NOTE — Telephone Encounter (Signed)
All her medications were sent to the following pharmacy based on her request. Please ask her if she would like to use a different pharmacy instead. If that's the case, please transfer all the orders. Thanks.  [facility] [phone] - [place], [place] - [address]  [address], [place]

## 2023-07-06 ENCOUNTER — Telehealth: Payer: Self-pay

## 2023-07-06 NOTE — Telephone Encounter (Signed)
Great! Please provide an update on the patient once it's confirmed.

## 2023-07-06 NOTE — Telephone Encounter (Signed)
Could you please contact [facility] [address] to verify the situation? There has been some back and forth in our conversations, and I wonder if they transferred the order. The pharmacy should not cancel orders on their own.

## 2023-07-06 NOTE — Telephone Encounter (Signed)
Pt notified that rx was received at the [facility] pharmacy and that they said to give them 90 minutes to get ready.

## 2023-07-06 NOTE — Telephone Encounter (Signed)
pt called states that the [facility] [address] canceled all of her rxs. she needs them to go to [facility] [address]. please send the wellbutrin, buspar and the hydroxyzine to [facility] [address].

## 2023-07-06 NOTE — Telephone Encounter (Signed)
Called [facility] [address] spoke to brittany explain that the medication was [facility] the wrong [facility] she stated that since [facility] a [facility] where the patient would like for them to transfer to she can just go to the preferred [facility] and they will be able to pull them and fill them there. Called patient to make her aware no answer left voicemail for patient to return call to office

## 2023-07-06 NOTE — Telephone Encounter (Signed)
called back to the [address] [facility] and they did get the 3 rxs and she stated to given them about 90 minutes to have ready .

## 2023-07-06 NOTE — Telephone Encounter (Signed)
Please notify the patient.

## 2023-07-06 NOTE — Telephone Encounter (Signed)
spoke with mike at the pharmacy he states that those 3 rxs were transfered. to the [facility] pharmacy.

## 2023-07-06 NOTE — Telephone Encounter (Signed)
ok the [address] pharmacy states that they don't have them, so I called back to the previous pharmacy and he said that he would call the [address] pharmacy himself so I'm going to wait for about and call [address] back to confirm.

## 2023-07-06 NOTE — Telephone Encounter (Signed)
Called patient to inform of pharmacy that medications were sent to no answer left voicemail for patient to call me back if there is a problem with the pharmacy

## 2023-07-08 ENCOUNTER — Ambulatory Visit
Admission: RE | Admit: 2023-07-08 | Discharge: 2023-07-08 | Disposition: A | Discharge status: Home / Self Care | Payer: Medicare PPO | Source: Ambulatory Visit | Attending: Nephrology | Admitting: Nephrology

## 2023-07-08 DIAGNOSIS — N1832 Chronic kidney disease, stage 3b: Secondary | ICD-10-CM | POA: Diagnosis not present

## 2023-07-08 DIAGNOSIS — N2889 Other specified disorders of kidney and ureter: Secondary | ICD-10-CM | POA: Diagnosis not present

## 2023-07-12 ENCOUNTER — Telehealth: Payer: Self-pay | Admitting: Nurse Practitioner

## 2023-07-12 NOTE — Telephone Encounter (Signed)
Copied from CRM [id number]. Topic: General - Other >> Jul 12, 2023  8:43 AM Everette C wrote: Reason for CRM: The patient has called to request that their orders for Diet and Nutrition counseling please specify that it is for concerns related to their Pre Diabetic condition as well as their Chronic Kidney Disease Stage 3B   Please contact the patient further when possible

## 2023-07-13 ENCOUNTER — Other Ambulatory Visit: Payer: Self-pay | Admitting: Nurse Practitioner

## 2023-07-13 DIAGNOSIS — H5340 Unspecified visual field defects: Secondary | ICD-10-CM | POA: Diagnosis not present

## 2023-07-13 DIAGNOSIS — H02831 Dermatochalasis of right upper eyelid: Secondary | ICD-10-CM | POA: Diagnosis not present

## 2023-07-13 DIAGNOSIS — H02403 Unspecified ptosis of bilateral eyelids: Secondary | ICD-10-CM | POA: Diagnosis not present

## 2023-07-13 DIAGNOSIS — H02834 Dermatochalasis of left upper eyelid: Secondary | ICD-10-CM | POA: Diagnosis not present

## 2023-07-13 NOTE — Telephone Encounter (Signed)
Medication Refill - Medication: benazepril (LOTENSIN) 10 MG tablet   [facility] called to request refills. If remaining refills at local pharmacy, please transfer to [facility].  Please advise.   Has the patient contacted their pharmacy? Yes.    (Agent: If yes, when and what did the pharmacy advise?)  Preferred Pharmacy (with phone number or street name):  [facility] - [place], [place] - [address]  [address] [place] [address]  Phone: [phone] Fax: [phone]  Hours: Not open 24 hours   Has the patient been seen for an appointment in the last year OR does the patient have an upcoming appointment? Yes.    Agent: Please be advised that RX refills may take up to 3 business days. We ask that you follow-up with your pharmacy.

## 2023-07-13 NOTE — Telephone Encounter (Signed)
Send to [employer]

## 2023-07-14 MED ORDER — BENAZEPRIL HCL 10 MG PO TABS: 10.0000 mg | ORAL_TABLET | Freq: Every day | ORAL | 3 refills | Status: DC

## 2023-07-14 NOTE — Telephone Encounter (Signed)
Remaining refills sent to [facility].  Requested Prescriptions  Pending Prescriptions Disp Refills   benazepril (LOTENSIN) 10 MG tablet 90 tablet 3    Sig: Take 1 tablet (10 mg total) by mouth daily.     Cardiovascular:  ACE Inhibitors Failed - 07/13/2023  2:30 PM      Failed - Cr in normal range and within 180 days    Creatinine, Ser  Date Value Ref Range Status  04/14/2023 1.34 (H) 0.57 - 1.00 mg/dL Final         Failed - K in normal range and within 180 days    Potassium  Date Value Ref Range Status  04/14/2023 5.3 (H) 3.5 - 5.2 mmol/L Final         Passed - Patient is not pregnant      Passed - Last BP in normal range    BP Readings from Last 1 Encounters:  03/29/23 101/66         Passed - Valid encounter within last 6 months    Recent Outpatient Visits           3 months ago Medicare annual wellness visit, subsequent   [facility] [place], Florence T, NP   10 months ago Chronic pain of left knee   [facility] Doyle, Corrie Dandy T, NP   1 year ago Bipolar 1 disorder, depressed, partial remission (HCC)   [facility] Bishop, Corrie Dandy T, NP   1 year ago Cat bite of left lower leg with infection, subsequent encounter   [facility] Mecum, Oswaldo Conroy, PA-C   1 year ago Cellulitis of right lower extremity   [facility] [place], Dorie Rank, NP       Future Appointments             In 2 months Cannady, Dorie Rank, NP [facility]

## 2023-07-16 ENCOUNTER — Encounter: Payer: Self-pay | Admitting: Dietician

## 2023-07-16 ENCOUNTER — Encounter: Payer: Medicare PPO | Attending: Nurse Practitioner | Admitting: Dietician

## 2023-07-16 VITALS — Ht 67.0 in | Wt 185.0 lb

## 2023-07-16 DIAGNOSIS — N189 Chronic kidney disease, unspecified: Secondary | ICD-10-CM | POA: Diagnosis not present

## 2023-07-16 DIAGNOSIS — Z713 Dietary counseling and surveillance: Secondary | ICD-10-CM | POA: Diagnosis not present

## 2023-07-16 DIAGNOSIS — E785 Hyperlipidemia, unspecified: Secondary | ICD-10-CM | POA: Diagnosis not present

## 2023-07-16 DIAGNOSIS — I129 Hypertensive chronic kidney disease with stage 1 through stage 4 chronic kidney disease, or unspecified chronic kidney disease: Secondary | ICD-10-CM | POA: Diagnosis not present

## 2023-07-16 DIAGNOSIS — N1832 Chronic kidney disease, stage 3b: Secondary | ICD-10-CM

## 2023-07-16 DIAGNOSIS — R7309 Other abnormal glucose: Secondary | ICD-10-CM | POA: Diagnosis not present

## 2023-07-16 NOTE — Patient Instructions (Signed)
Aim to be active for 30 minutes most days of the week  [employer] or walking Lower saturated fat More plant based Limit red meat Avoid foods with added potassium or added Phos.Marland KitchenMarland Kitchen

## 2023-07-16 NOTE — Progress Notes (Signed)
Medical Nutrition Therapy  Appointment Start time:  1530  Appointment End time:  1630 Patient is here today with a friend.   Primary concerns today: She would like to learn more about what to eat regarding your kidney's and overall body Referral diagnosis: Essential HTN, HLD, elevated A1C Preferred learning style:  no preference indicated Learning readiness: ready   NUTRITION ASSESSMENT  67" 185 lbs 09/24/2022 193 lbs 03/29/2023 202 lbs 12/26 23  Clinical Medical Hx: prediabetes, HTN, HLD, anxiety, depression, CKD stage 3b, PTSD Medications: see list Labs: A1C 5.7% 09/08/2022, vitamin D 44 04/29/2023, BUN 27, Creatine 1.34, Potassium 5.3 on 04/14/23, eGFR 42 Lipid Panel     Component Value Date/Time   CHOL 149 03/29/2023 0932   CHOL 167 08/17/2017 1034   TRIG 147 03/29/2023 0932   TRIG 195 (H) 08/17/2017 1034   HDL 51 03/29/2023 0932   CHOLHDL 3.3 12/22/2021 0922   CHOLHDL 3.7 02/23/2016 1344   VLDL 39 (H) 08/17/2017 1034   LDLCALC 73 03/29/2023 0932   LABVLDL 25 03/29/2023 0932   Notable Signs/Symptoms: occasional vomiting thought secondary to increased stress.  Lifestyle & Dietary Hx Patient is living with her friend since leaving her husband.  She states that her health has improved since. She is a retired Location manager.  Supplements: MVI, calcium with vitamin D, Omega-3 Sleep: good Stress / self-care: improving Current average weekly physical activity: Has a [employer] membership  24-Hr Dietary Recall First Meal: 2 slices white toast with jelly Snack: none Second Meal: salad, boiled egg, buttermilk ranch, unsalted top saltine OR cottage cheese and apple Snack: none Third Meal: chicken nuggets from [employer] OR burger BUT usually fruit, salad Snack: occasional popcorn Beverages: water, lemonade (sugar free) diet cranberry juice, hot chocolate (regular), coffee with milk or cream  Estimated Energy Needs Calories: 2000 Protein: 65g  NUTRITION DIAGNOSIS   NB-1.1 Food and nutrition-related knowledge deficit As related to nutrition for HTN, prediabetes and CKD.  As evidenced by diet hx and patient report.   NUTRITION INTERVENTION  Nutrition education (E-1) on the following topics:  Tips for CKD and nutrition low sodium diet, no added potassium, no added phosphorous products, plant protein more than meat protein Decreased saturated fat Portions Low sodium and stress impact on HTN Tips to prevent diabetes - stay active, low saturated fat/total fat, small weight loss, water rather than sugary drinks  Handouts Provided Include  NKD national kidney diet - Dish up a Kidney-Friendly Meal for Patients with Chronic Kidney Disease (not on dialysis) Chronic Kidney Disease Stages 3-5 Nutrition Therapy from AND  Learning Style & Readiness for Change Teaching method utilized: Visual & Auditory  Demonstrated degree of understanding via: Teach Back  Barriers to learning/adherence to lifestyle change: depression  Goals Established by Pt Aim to be active for 30 minutes most days of the week  [employer] or walking Lower saturated fat More plant based Limit red meat Avoid foods with added potassium or added Phos...   MONITORING & EVALUATION Dietary intake, weekly physical activity prn.   Next Steps  Patient is to call or My Chart with questions.

## 2023-07-16 NOTE — Telephone Encounter (Signed)
Attempted to reach patient no answer. Left VM for a return call  Okay for PEC to give patient the message below from the provider

## 2023-07-19 ENCOUNTER — Other Ambulatory Visit: Payer: Self-pay | Admitting: Psychiatry

## 2023-07-19 DIAGNOSIS — F419 Anxiety disorder, unspecified: Secondary | ICD-10-CM

## 2023-07-19 DIAGNOSIS — F3175 Bipolar disorder, in partial remission, most recent episode depressed: Secondary | ICD-10-CM

## 2023-07-20 NOTE — Telephone Encounter (Signed)
Attempted to reach out to patient again she stated that everything is fine and she is good to go.
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
## 2023-07-25 NOTE — Patient Instructions (Incomplete)
[facility] [employer] = [phone]  Be Involved in Caring For Your Health:  Taking Medications When medications are taken as directed, they can greatly improve your health. But if they are not taken as prescribed, they may not work. In some cases, not taking them correctly can be harmful. To help ensure your treatment remains effective and safe, understand your medications and how to take them. Bring your medications to each visit for review by your provider.  Your lab results, notes, and after visit summary will be available on My Chart. We strongly encourage you to use this feature. If lab results are abnormal the clinic will contact you with the appropriate steps. If the clinic does not contact you assume the results are satisfactory. You can always view your results on My Chart. If you have questions regarding your health or results, please contact the clinic during office hours. You can also ask questions on My Chart.  We at [facility] are grateful that you chose [place] to provide your care. We strive to provide evidence-based and compassionate care and are always looking for feedback. If you get a survey from the clinic please complete this so we can hear your opinions.  Healthy Eating, Adult Healthy eating may help you get and keep a healthy body weight, reduce the risk of chronic disease, and live a long and productive life. It is important to follow a healthy eating pattern. Your nutritional and calorie needs should be met mainly by different nutrient-rich foods. What are tips for following this plan? Reading food labels Read labels and choose the following: Reduced or low sodium products. Juices with 100% fruit juice. Foods with low saturated fats (<3 g per serving) and high polyunsaturated and monounsaturated fats. Foods with whole grains, such as whole wheat, cracked wheat, brown rice, and wild rice. Whole grains that are fortified with folic acid. This is  recommended for females who are pregnant or who want to become pregnant. Read labels and do not eat or drink the following: Foods or drinks with added sugars. These include foods that contain brown sugar, corn sweetener, corn syrup, dextrose, fructose, glucose, high-fructose corn syrup, honey, invert sugar, lactose, malt syrup, maltose, molasses, raw sugar, sucrose, trehalose, or turbinado sugar. Limit your intake of added sugars to less than 10% of your total daily calories. Do not eat more than the following amounts of added sugar per day: 6 teaspoons (25 g) for females. 9 teaspoons (38 g) for males. Foods that contain processed or refined starches and grains. Refined grain products, such as white flour, degermed cornmeal, white bread, and white rice. Shopping Choose nutrient-rich snacks, such as vegetables, whole fruits, and nuts. Avoid high-calorie and high-sugar snacks, such as potato chips, fruit snacks, and candy. Use oil-based dressings and spreads on foods instead of solid fats such as butter, margarine, sour cream, or cream cheese. Limit pre-made sauces, mixes, and "instant" products such as flavored rice, instant noodles, and ready-made pasta. Try more plant-protein sources, such as tofu, tempeh, black beans, edamame, lentils, nuts, and seeds. Explore eating plans such as the Mediterranean diet or vegetarian diet. Try heart-healthy dips made with beans and healthy fats like hummus and guacamole. Vegetables go great with these. Cooking Use oil to saut or stir-fry foods instead of solid fats such as butter, margarine, or lard. Try baking, boiling, grilling, or broiling instead of frying. Remove the fatty part of meats before cooking. Steam vegetables in water or broth. Meal planning  At meals,  imagine dividing your plate into fourths: One-half of your plate is fruits and vegetables. One-fourth of your plate is whole grains. One-fourth of your plate is protein, especially lean  meats, poultry, eggs, tofu, beans, or nuts. Include low-fat dairy as part of your daily diet. Lifestyle Choose healthy options in all settings, including home, work, school, restaurants, or stores. Prepare your food safely: Wash your hands after handling raw meats. Where you prepare food, keep surfaces clean by regularly washing with hot, soapy water. Keep raw meats separate from ready-to-eat foods, such as fruits and vegetables. Cook seafood, meat, poultry, and eggs to the recommended temperature. Get a food thermometer. Store foods at safe temperatures. In general: Keep cold foods at 59F (4.4C) or below. Keep hot foods at 159F (60C) or above. Keep your freezer at [facility] (-17.8C) or below. Foods are not safe to eat if they have been between the temperatures of 40-159F (4.4-60C) for more than 2 hours. What foods should I eat? Fruits Aim to eat 1-2 cups of fresh, canned (in natural juice), or frozen fruits each day. One cup of fruit equals 1 small apple, 1 large banana, 8 large strawberries, 1 cup (237 g) canned fruit,  cup (82 g) dried fruit, or 1 cup (240 mL) 100% juice. Vegetables Aim to eat 2-4 cups of fresh and frozen vegetables each day, including different varieties and colors. One cup of vegetables equals 1 cup (91 g) broccoli or cauliflower florets, 2 medium carrots, 2 cups (150 g) raw, leafy greens, 1 large tomato, 1 large Kight pepper, 1 large sweet potato, or 1 medium white potato. Grains Aim to eat 5-10 ounce-equivalents of whole grains each day. Examples of 1 ounce-equivalent of grains include 1 slice of bread, 1 cup (40 g) ready-to-eat cereal, 3 cups (24 g) popcorn, or  cup (93 g) cooked rice. Meats and other proteins Try to eat 5-7 ounce-equivalents of protein each day. Examples of 1 ounce-equivalent of protein include 1 egg,  oz nuts (12 almonds, 24 pistachios, or 7 walnut halves), 1/4 cup (90 g) cooked beans, 6 tablespoons (90 g) hummus or 1 tablespoon (16 g) peanut  butter. A cut of meat or fish that is the size of a deck of cards is about 3-4 ounce-equivalents (85 g). Of the protein you eat each week, try to have at least 8 sounce (227 g) of seafood. This is about 2 servings per week. This includes salmon, trout, herring, sardines, and anchovies. Dairy Aim to eat 3 cup-equivalents of fat-free or low-fat dairy each day. Examples of 1 cup-equivalent of dairy include 1 cup (240 mL) milk, 8 ounces (250 g) yogurt, 1 ounces (44 g) natural cheese, or 1 cup (240 mL) fortified soy milk. Fats and oils Aim for about 5 teaspoons (21 g) of fats and oils per day. Choose monounsaturated fats, such as canola and olive oils, mayonnaise made with olive oil or avocado oil, avocados, peanut butter, and most nuts, or polyunsaturated fats, such as sunflower, corn, and soybean oils, walnuts, pine nuts, sesame seeds, sunflower seeds, and flaxseed. Beverages Aim for 6 eight-ounce glasses of water per day. Limit coffee to 3-5 eight-ounce cups per day. Limit caffeinated beverages that have added calories, such as soda and energy drinks. If you drink alcohol: Limit how much you have to: 0-1 drink a day if you are female. 0-2 drinks a day if you are female. Know how much alcohol is in your drink. In the [place]., one drink is one 12 oz bottle of beer (  355 mL), one 5 oz glass of wine (148 mL), or one 1 oz glass of hard liquor (44 mL). Seasoning and other foods Try not to add too much salt to your food. Try using herbs and spices instead of salt. Try not to add sugar to food. This information is based on U.[place] nutrition guidelines. To learn more, visit DisposableNylon.be. Exact amounts may vary. You may need different amounts. This information is not intended to replace advice given to you by your health care provider. Make sure you discuss any questions you have with your health care provider. Document Revised: 06/01/2022 Document Reviewed: 06/01/2022 Elsevier Patient Education  2024  [employer].

## 2023-07-26 ENCOUNTER — Telehealth: Payer: Self-pay | Admitting: Psychiatry

## 2023-07-26 NOTE — Telephone Encounter (Signed)
noted 

## 2023-07-26 NOTE — Telephone Encounter (Signed)
Patient had virtual appt. For 07/30/23 But was then moved to in person for 07/29/23. She stated she was doing so much better and wanted to move the appointment out to December. She was not starting the Wellbutrin. Wanted virtual appointment for January. She feels so much better she kept saying. So appointment rescheduled to January for virtual

## 2023-07-27 ENCOUNTER — Ambulatory Visit: Payer: Self-pay

## 2023-07-27 NOTE — Telephone Encounter (Signed)
     Chief Complaint: Left shoulder pain Symptoms: Pain Frequency: July Pertinent Negatives: Patient denies  Disposition: [] ED /[] Urgent Care (no appt availability in office) / [x] Appointment(In office/virtual)/ []  [facility]/ [] Home Care/ [] Refused Recommended Disposition /[] [facility]/ []  Follow-up with PCP Additional Notes: Agrees with appointment.  Reason for Disposition  [1] MODERATE pain (e.g., interferes with normal activities) AND [2] present > 3 days  Answer Assessment - Initial Assessment Questions 1. ONSET: "When did the pain start?"     July, seen in [facility] 2. LOCATION: "Where is the pain located?"     Left 3. PAIN: "How bad is the pain?" (Scale 1-10; or mild, moderate, severe)   - MILD (1-3): doesn't interfere with normal activities   - MODERATE (4-7): interferes with normal activities (e.g., work or school) or awakens from sleep   - SEVERE (8-10): excruciating pain, unable to do any normal activities, unable to move arm at all due to pain     Sharp - 10 4. WORK OR EXERCISE: "Has there been any recent work or exercise that involved this part of the body?"     No 5. CAUSE: "What do you think is causing the shoulder pain?"     Arthritis 6. OTHER SYMPTOMS: "Do you have any other symptoms?" (e.g., neck pain, swelling, rash, fever, numbness, weakness)     Pain 7. PREGNANCY: "Is there any chance you are pregnant?" "When was your last menstrual period?"     No  Protocols used: Shoulder Pain-A-AH

## 2023-07-28 ENCOUNTER — Encounter: Payer: Self-pay | Admitting: Nurse Practitioner

## 2023-07-28 ENCOUNTER — Ambulatory Visit: Payer: Medicare PPO | Admitting: Nurse Practitioner

## 2023-07-28 VITALS — BP 110/73 | HR 66 | Temp 98.8°F | Ht 63.2 in | Wt 182.2 lb

## 2023-07-28 DIAGNOSIS — F3175 Bipolar disorder, in partial remission, most recent episode depressed: Secondary | ICD-10-CM

## 2023-07-28 DIAGNOSIS — M25512 Pain in left shoulder: Secondary | ICD-10-CM

## 2023-07-28 DIAGNOSIS — G8929 Other chronic pain: Secondary | ICD-10-CM

## 2023-07-28 MED ORDER — LIDOCAINE 5 % EX PTCH: 1.0000 | MEDICATED_PATCH | CUTANEOUS | 1 refills | Status: DC

## 2023-07-28 NOTE — Assessment & Plan Note (Signed)
Chronic, ongoing.  Referral to ortho placed.  Lidocaine patches sent.  Continue current at home regimen for pain.

## 2023-07-28 NOTE — Assessment & Plan Note (Signed)
Chronic, ongoing.  Followed by psychiatry, lives in [place] now and would like psychiatrist more local - referral placed.  Continue current medication regimen as prescribed by them. Denies SI/HI.  Continue collaboration with SW and CCM nurse.  Gave her number to [facility] center locally and advised her to call as needed - she agrees with this plan.  Reports safe environment at present.

## 2023-07-28 NOTE — Progress Notes (Signed)
BP 110/73   Pulse 66   Temp 98.8 F (37.1 C) (Oral)   Ht 5' 3.2" (1.605 m)   Wt 182 lb 3.2 oz (82.6 kg)   SpO2 97%   BMI 32.07 kg/m    Subjective:    Patient ID: Elizabeth Malone, female    DOB: 06-20-1951, 72 y.o.   MRN: [id number]  HPI: Elizabeth Malone is a 72 y.o. female  Chief Complaint  Patient presents with   Shoulder Pain    Patient states she has been having L shoulder pain since July. States she was seen at Ortho in the summer and was given Prednisone. Also received a cortisone injection from ortho in [place] last month. States neither of these have really helped. States she feels a lot more pain when she moves her shoulder    Would like to see a new psychiatrist as is living in [place] now.  SHOULDER PAIN Has been present for a long while with bone on bone to left shoulder.  She has seen ortho for it in [place] and Dr. Odis Luster locally.  Had cortisone shot last month to shoulder but this has offered no benefit.  Has not performed physical therapy.  Currently has left her husband and going between houses at present.  Has joined [employer] near [place] and was doing [employer], but she ended up moving back locally due to mood changes when living in [place]. Duration: chronic Involved shoulder: left Mechanism of injury: unknown Location: lateral Onset:gradual Severity: 10/10 at worst Quality:  dull, aching, and throbbing Frequency: intermittent Radiation: no Aggravating factors: lifting, movement, and throwing  Alleviating factors: Tylenol, Salon Pas, Voltaren gel  Status: fluctuating Treatments attempted: as above  Relief with NSAIDs?:  No NSAIDs Taken Weakness: yes Numbness: no Decreased grip strength: yes Redness: no Swelling: no Bruising: no Fevers: no     07/16/2023    3:49 PM 03/29/2023    9:42 AM 09/08/2022    9:32 AM 07/07/2022    8:44 AM 04/06/2022    4:33 PM  Depression screen PHQ 2/9  Decreased Interest 0 1 3 0 0  Down, Depressed, Hopeless 1  2 3 1 1   PHQ - 2 Score 1 3 6 1 1   Altered sleeping  1 3 3 1   Tired, decreased energy  2 3 3  0  Change in appetite  2 3 1  0  Feeling bad or failure about yourself   2 3 1  0  Trouble concentrating  3 3 3  0  Moving slowly or fidgety/restless  0 3 2 0  Suicidal thoughts  0 2 0 0  PHQ-9 Score  13 26 14 2   Difficult doing work/chores  Somewhat difficult Extremely dIfficult Somewhat difficult Not difficult at all       03/29/2023    9:42 AM 09/08/2022    9:32 AM 07/07/2022    8:45 AM 04/06/2022    4:33 PM  GAD 7 : Generalized Anxiety Score  Nervous, Anxious, on Edge 0 3 3 1   Control/stop worrying 1 3 3 1   Worry too much - different things 3 3 3  0  Trouble relaxing 0 3 3 1   Restless 0 0 1 0  Easily annoyed or irritable 0 0 2 0  Afraid - awful might happen 1 3 3  0  Total GAD 7 Score 5 15 18 3   Anxiety Difficulty Not difficult at all Very difficult Very difficult Not difficult at all      Relevant past medical,  surgical, family and social history reviewed and updated as indicated. Interim medical history since our last visit reviewed. Allergies and medications reviewed and updated.  Review of Systems  Constitutional:  Negative for activity change, appetite change, diaphoresis, fatigue and fever.  Respiratory:  Negative for cough, chest tightness, shortness of breath and wheezing.   Cardiovascular:  Negative for chest pain, palpitations and leg swelling.  Gastrointestinal: Negative.   Musculoskeletal:  Positive for arthralgias.  Neurological: Negative.   Psychiatric/Behavioral: Negative.     Per HPI unless specifically indicated above     Objective:    BP 110/73   Pulse 66   Temp 98.8 F (37.1 C) (Oral)   Ht 5' 3.2" (1.605 m)   Wt 182 lb 3.2 oz (82.6 kg)   SpO2 97%   BMI 32.07 kg/m   Wt Readings from Last 3 Encounters:  07/28/23 182 lb 3.2 oz (82.6 kg)  07/16/23 185 lb (83.9 kg)  03/29/23 193 lb (87.5 kg)    Physical Exam Vitals and nursing note reviewed.   Constitutional:      General: She is awake. She is not in acute distress.    Appearance: She is well-developed and well-groomed. She is obese. She is not ill-appearing or toxic-appearing.  HENT:     Head: Normocephalic.     Right Ear: Hearing and external ear normal.     Left Ear: Hearing and external ear normal.  Eyes:     General: Lids are normal.        Right eye: No discharge.        Left eye: No discharge.     Conjunctiva/sclera: Conjunctivae normal.     Pupils: Pupils are equal, round, and reactive to light.  Neck:     Thyroid: No thyromegaly.     Vascular: No carotid bruit.  Cardiovascular:     Rate and Rhythm: Normal rate and regular rhythm.     Heart sounds: Normal heart sounds. No murmur heard.    No gallop.  Pulmonary:     Effort: Pulmonary effort is normal. No accessory muscle usage or respiratory distress.     Breath sounds: Normal breath sounds.  Abdominal:     General: Bowel sounds are normal. There is no distension.     Palpations: Abdomen is soft.     Tenderness: There is no abdominal tenderness.  Musculoskeletal:     Right shoulder: Normal.     Left shoulder: Tenderness and crepitus present. No swelling, laceration or bony tenderness. Decreased range of motion (decrease flexion and extension). Decreased strength. Normal pulse.     Cervical back: Normal range of motion and neck supple.     Right lower leg: No edema.     Left lower leg: No edema.  Lymphadenopathy:     Cervical: No cervical adenopathy.  Skin:    General: Skin is warm and dry.  Neurological:     Mental Status: She is alert and oriented to person, place, and time.     Deep Tendon Reflexes: Reflexes are normal and symmetric.     Reflex Scores:      Brachioradialis reflexes are 2+ on the right side and 2+ on the left side.      Patellar reflexes are 2+ on the right side and 2+ on the left side. Psychiatric:        Attention and Perception: Attention normal.        Mood and Affect: Mood  normal.  Speech: Speech normal.        Behavior: Behavior normal. Behavior is cooperative.        Thought Content: Thought content normal.    Results for orders placed or performed in visit on 03/29/23  Bayer DCA Hb A1c Waived  Result Value Ref Range   HB A1C (BAYER DCA - WAIVED) 5.3 4.8 - 5.6 %  Microalbumin, Urine Waived  Result Value Ref Range   Microalb, Ur Waived 30 (H) 0 - 19 mg/L   Creatinine, Urine Waived 50 10 - 300 mg/dL   Microalb/Creat Ratio 30-300 (H) <30 mg/g  CBC with Differential/Platelet  Result Value Ref Range   WBC 5.6 3.4 - 10.8 x10E3/uL   RBC 4.09 3.77 - 5.28 x10E6/uL   Hemoglobin 12.1 11.1 - 15.9 g/dL   Hematocrit 16.1 09.6 - 46.6 %   MCV 90 79 - 97 fL   MCH 29.6 26.6 - 33.0 pg   MCHC 33.1 31.5 - 35.7 g/dL   RDW 04.5 40.9 - 81.1 %   Platelets 251 150 - 450 x10E3/uL   Neutrophils 52 Not Estab. %   Lymphs 36 Not Estab. %   Monocytes 8 Not Estab. %   Eos 3 Not Estab. %   Basos 1 Not Estab. %   Neutrophils Absolute 3.0 1.4 - 7.0 x10E3/uL   Lymphocytes Absolute 2.0 0.7 - 3.1 x10E3/uL   Monocytes Absolute 0.4 0.1 - 0.9 x10E3/uL   EOS (ABSOLUTE) 0.2 0.0 - 0.4 x10E3/uL   Basophils Absolute 0.0 0.0 - 0.2 x10E3/uL   Immature Granulocytes 0 Not Estab. %   Immature Grans (Abs) 0.0 0.0 - 0.1 x10E3/uL  Comprehensive metabolic panel  Result Value Ref Range   Glucose 94 70 - 99 mg/dL   BUN 24 8 - 27 mg/dL   Creatinine, Ser 9.14 (H) 0.57 - 1.00 mg/dL   eGFR 39 (L) >78 GN/FAO/1.30   BUN/Creatinine Ratio 17 12 - 28   Sodium 140 134 - 144 mmol/L   Potassium 4.7 3.5 - 5.2 mmol/L   Chloride 104 96 - 106 mmol/L   CO2 22 20 - 29 mmol/L   Calcium 10.2 8.7 - 10.3 mg/dL   Total Protein 7.0 6.0 - 8.5 g/dL   Albumin 4.5 3.8 - 4.8 g/dL   Globulin, Total 2.5 1.5 - 4.5 g/dL   Bilirubin Total 0.4 0.0 - 1.2 mg/dL   Alkaline Phosphatase 85 44 - 121 IU/L   AST 26 0 - 40 IU/L   ALT 27 0 - 32 IU/L  Lipid Panel w/o Chol/HDL Ratio  Result Value Ref Range   Cholesterol,  Total 149 100 - 199 mg/dL   Triglycerides 865 0 - 149 mg/dL   HDL 51 >78 mg/dL   VLDL Cholesterol Cal 25 5 - 40 mg/dL   LDL Chol Calc (NIH) 73 0 - 99 mg/dL  TSH  Result Value Ref Range   TSH 1.080 0.450 - 4.500 uIU/mL  VITAMIN D 25 Hydroxy (Vit-D Deficiency, Fractures)  Result Value Ref Range   Vit D, 25-Hydroxy 35.4 30.0 - 100.0 ng/mL  Magnesium  Result Value Ref Range   Magnesium 2.1 1.6 - 2.3 mg/dL      Assessment & Plan:   Problem List Items Addressed This Visit       Other   Bipolar 1 disorder, depressed, partial remission (HCC) - Primary    Chronic, ongoing.  Followed by psychiatry, lives in [place] now and would like psychiatrist more local - referral placed.  Continue current  medication regimen as prescribed by them. Denies SI/HI.  Continue collaboration with SW and CCM nurse.  Gave her number to [facility] center locally and advised her to call as needed - she agrees with this plan.  Reports safe environment at present.      Relevant Orders   Ambulatory referral to Psychiatry   Chronic left shoulder pain    Chronic, ongoing.  Referral to ortho placed.  Lidocaine patches sent.  Continue current at home regimen for pain.       Relevant Orders   Ambulatory referral to Orthopedic Surgery     Follow up plan: Return for as scheduled in January.

## 2023-07-29 ENCOUNTER — Ambulatory Visit: Payer: Medicare PPO | Admitting: Psychiatry

## 2023-07-30 ENCOUNTER — Telehealth: Payer: Medicare PPO | Admitting: Psychiatry

## 2023-08-03 ENCOUNTER — Ambulatory Visit: Payer: Medicare PPO | Admitting: Podiatry

## 2023-08-03 ENCOUNTER — Encounter: Payer: Self-pay | Admitting: Podiatry

## 2023-08-03 ENCOUNTER — Ambulatory Visit (INDEPENDENT_AMBULATORY_CARE_PROVIDER_SITE_OTHER): Payer: Medicare PPO

## 2023-08-03 DIAGNOSIS — M2042 Other hammer toe(s) (acquired), left foot: Secondary | ICD-10-CM | POA: Diagnosis not present

## 2023-08-03 DIAGNOSIS — M79674 Pain in right toe(s): Secondary | ICD-10-CM | POA: Diagnosis not present

## 2023-08-03 DIAGNOSIS — M79675 Pain in left toe(s): Secondary | ICD-10-CM

## 2023-08-03 DIAGNOSIS — B351 Tinea unguium: Secondary | ICD-10-CM | POA: Diagnosis not present

## 2023-08-03 DIAGNOSIS — L84 Corns and callosities: Secondary | ICD-10-CM

## 2023-08-03 NOTE — Progress Notes (Signed)
Chief Complaint  Patient presents with   Callouses    "I have a place in between my toes.  I have one on this toe and one toe has a pointy place on it that I have to cut down." N - corns L - Interdigital 4 and 5, 4th, 2nd left D - 3 years O - gradually worse C - burn, tender, sore A - walking and standing T - cut off the dead part    HPI: 72 y.o. female presenting today for evaluation of left foot pain secondary to calluses that developed.  She says that she has a history of multiple surgeries to the bilateral feet.  She presents today for further treatment evaluation  Past Medical History:  Diagnosis Date   Anxiety    Arthritis    Bipolar disorder (HCC)    CKD (chronic kidney disease) stage 3, GFR 30-59 ml/min (HCC)    Complication of anesthesia    Depression    GERD (gastroesophageal reflux disease)    occasionally has to use tums   Headache    History of diverticulosis    Hyperlipidemia    Hypertension    Macular degeneration    Overactive bladder    PONV (postoperative nausea and vomiting)    PTSD (post-traumatic stress disorder)     Past Surgical History:  Procedure Laterality Date   COLONOSCOPY WITH PROPOFOL N/A 10/07/2017   Procedure: COLONOSCOPY WITH PROPOFOL;  Surgeon: Midge Minium, MD;  Location[facility];  Service: Endoscopy;  Laterality: N/A;   COLONOSCOPY WITH PROPOFOL N/A 03/08/2023   Procedure: COLONOSCOPY WITH PROPOFOL;  Surgeon: Midge Minium, MD;  Location: [facility];  Service: Endoscopy;  Laterality: N/A;   FOOT SURGERY Bilateral    KNEE SURGERY Left    POLYPECTOMY  10/07/2017   Procedure: POLYPECTOMY;  Surgeon: Midge Minium, MD;  Location: [facility];  Service: Endoscopy;;   POLYPECTOMY  03/08/2023   Procedure: POLYPECTOMY;  Surgeon: Midge Minium, MD;  Location: [facility];  Service: Endoscopy;;   TONSILLECTOMY AND ADENOIDECTOMY      Allergies  Allergen Reactions   Fluoxetine Other (See Comments)    Paroxetine Hcl Other (See Comments)   Penicillin G Benzathine Other (See Comments)   Penicillin V Potassium Other (See Comments)   Clindamycin Hcl Rash and Other (See Comments)    Trudie Buckler' syndrome   Lamotrigine Rash     Physical Exam: General: The patient is alert and oriented x3 in no acute distress.  Dermatology: Skin is warm, dry and supple bilateral lower extremities.  Hyperkeratotic elongated dystrophic nails noted 1-5 bilateral.  Symptomatic callus is also noted to the lesser digits of the left foot  Vascular: Palpable pedal pulses bilaterally. Capillary refill within normal limits.  No appreciable edema.  No erythema.  Neurological: Grossly intact via light touch  Musculoskeletal Exam: Recurrent hammertoe deformity noted to the lesser digits of the left foot with rigidity.  Radiographic Exam LT foot 08/03/2023:  No acute fractures identified.  Degenerative changes noted to the first and second MTP.  Hardware noted to the first MTP as well as the second and third digits of the left foot and second metatarsal head.  Hardware appears stable and intact.  Assessment/Plan of Care: 1.  Pain due to onychomycosis of toenails both 2.  Symptomatic calluses to the distal tips of the toes left foot 3.  H/o multiple bilateral foot surgery  -Patient evaluated.  X-rays reviewed -Mechanical debridement of nails 1-5 bilateral was  performed using a nail nipper without incident or bleeding -Excisional debridement of the hyperkeratotic calluses to the tips of the toes of the left foot was performed today using a tissue nipper.  Patient felt relief -Silicone toe crest pad was dispensed for the patient to alleviate pressure from the distal tips of the toes and potentially alleviate the callus development -Advised against going barefoot.  Recommend good supportive shoes and sneakers -Return to clinic as needed       Felecia Shelling, DPM [facility]  Dr. Felecia Shelling, DPM     [address] [place], [place] [address]                Office [phone]  Fax [phone]

## 2023-08-09 ENCOUNTER — Other Ambulatory Visit: Payer: Self-pay | Admitting: Psychiatry

## 2023-08-09 DIAGNOSIS — F3181 Bipolar II disorder: Secondary | ICD-10-CM

## 2023-08-10 ENCOUNTER — Other Ambulatory Visit: Payer: Self-pay | Admitting: Psychiatry

## 2023-08-10 ENCOUNTER — Telehealth: Payer: Self-pay

## 2023-08-10 ENCOUNTER — Telehealth: Payer: Self-pay | Admitting: Nurse Practitioner

## 2023-08-10 DIAGNOSIS — F419 Anxiety disorder, unspecified: Secondary | ICD-10-CM

## 2023-08-10 DIAGNOSIS — F3175 Bipolar disorder, in partial remission, most recent episode depressed: Secondary | ICD-10-CM

## 2023-08-10 MED ORDER — VENLAFAXINE HCL ER 75 MG PO CP24
225.0000 mg | ORAL_CAPSULE | Freq: Every day | ORAL | 1 refills | Status: AC
Start: 2023-08-26 — End: 2024-07-05

## 2023-08-10 NOTE — Telephone Encounter (Signed)
Received fax from [facility] requesting a refill for the following medication please adviise  venlafaxine XR (EFFEXOR-XR) 75 MG 24 hr capsule    Last visit 07/26/23 Next visit 09/24/23

## 2023-08-10 NOTE — Telephone Encounter (Signed)
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

## 2023-08-10 NOTE — Telephone Encounter (Signed)
Ordered

## 2023-08-11 ENCOUNTER — Telehealth: Payer: Self-pay

## 2023-08-11 ENCOUNTER — Encounter: Payer: Medicare PPO | Admitting: *Deleted

## 2023-08-11 ENCOUNTER — Other Ambulatory Visit: Payer: Medicare PPO

## 2023-08-11 NOTE — Telephone Encounter (Signed)
PA for Oxybutynin initiated and submitted via Cover My Meds. Key: [id number]

## 2023-08-11 NOTE — Telephone Encounter (Signed)
PA approved.

## 2023-08-11 NOTE — Telephone Encounter (Signed)
Noted, will discuss with her more at next visit.

## 2023-08-18 ENCOUNTER — Ambulatory Visit: Payer: Self-pay | Admitting: *Deleted

## 2023-08-18 NOTE — Patient Outreach (Signed)
  Care Coordination   08/18/2023 Name: Elizabeth Malone MRN: [id number] DOB: 01-Feb-1951   Care Coordination Outreach Attempts:  An unsuccessful telephone outreach was attempted for a scheduled appointment today.  Follow Up Plan:  Additional outreach attempts will be made to offer the patient care coordination information and services.   Encounter Outcome:  No Answer   Care Coordination Interventions:  No, not indicated    Rodney Langton, RN, MSN, CCM [place]  [facility], [facility] Health RN Care Coordinator Direct Dial: [phone] / Main [phone] Fax [phone] Email: [email or web address]: [email or web address]

## 2023-08-19 ENCOUNTER — Telehealth: Payer: Self-pay | Admitting: Nurse Practitioner

## 2023-08-19 NOTE — Progress Notes (Signed)
Pt reschedule for 12/10 - pt aware that Elizabeth Malone will be calling not Pam

## 2023-08-19 NOTE — Telephone Encounter (Signed)
Copied from CRM [id number]. Topic: Appointments - Appointment Cancel/Reschedule >> Aug 18, 2023  4:12 PM Albin Felling L wrote: Reason for CRM: Pt states she was supposed to have a phone call with Carma Lair today but did not hear from her, pt had appt cancelled last week. Pt was scheduled with [facility] today but did not hear from her either. Pt requesting to have appt rescheduled as soon as possible.

## 2023-08-24 ENCOUNTER — Telehealth: Payer: Self-pay

## 2023-08-24 ENCOUNTER — Ambulatory Visit: Payer: Self-pay | Admitting: *Deleted

## 2023-08-24 ENCOUNTER — Telehealth: Payer: Self-pay | Admitting: *Deleted

## 2023-08-24 NOTE — Telephone Encounter (Signed)
left message for pt to call back not showing that pt is taking this medication.
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
## 2023-08-24 NOTE — Telephone Encounter (Signed)
Please let me know
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
## 2023-08-24 NOTE — Patient Instructions (Signed)
Visit Information  Thank you for taking time to visit with me today. Please don't hesitate to contact me if I can be of assistance to you.   Following are the goals we discussed today:   Goals Addressed             This Visit's Progress    care coordination activities       Activities and task to complete in order to accomplish goals.   EMOTIONAL / MENTAL HEALTH SUPPORT Keep all upcoming appointment discussed today-appt with therapist Elita Quick 08/26/23 Self Support options  (contact psychiatrist re: prescription for the anti-depressant previously prescribed, journal your thoughts and feelings, continue to reach out to natural supports that provide positive support and encouragement) Please call [phone] in the event of a mental health crisis        Please call the care guide team at [phone] if you need to cancel or reschedule your appointment.   If you are experiencing a Mental Health or Behavioral Health Crisis or need someone to talk to, please call the Suicide and Crisis Lifeline: 988   Patient verbalizes understanding of instructions and care plan provided today and agrees to view in MyChart. Active MyChart status and patient understanding of how to access instructions and care plan via MyChart confirmed with patient.     No further follow up required: patient currently active with a therapist and psychiatrist -CSW to follow up as needed  [employer], [employer] [place]  [facility], [facility] Health Licensed Clinical Social Worker Care Coordinator  Direct Dial: [phone]

## 2023-08-24 NOTE — Telephone Encounter (Signed)
pt called left a message that she needs a rx sent to [facility] [place] for the wellbutrin. pt was last seen on 10-21 next appt 1-23

## 2023-08-25 ENCOUNTER — Other Ambulatory Visit: Payer: Self-pay | Admitting: Psychiatry

## 2023-08-25 MED ORDER — BUPROPION HCL ER (XL) 150 MG PO TB24: 150.0000 mg | ORAL_TABLET | Freq: Every day | ORAL | 1 refills | Status: DC

## 2023-08-25 NOTE — Telephone Encounter (Signed)
left message asking pt to please call office back to confirm the medication she is requesting

## 2023-08-25 NOTE — Telephone Encounter (Signed)
Bupropion was ordered to the pharmacy. She was started on this medication at her last visit, fyi

## 2023-08-26 DIAGNOSIS — D2262 Melanocytic nevi of left upper limb, including shoulder: Secondary | ICD-10-CM | POA: Diagnosis not present

## 2023-08-26 DIAGNOSIS — L821 Other seborrheic keratosis: Secondary | ICD-10-CM | POA: Diagnosis not present

## 2023-08-26 DIAGNOSIS — L570 Actinic keratosis: Secondary | ICD-10-CM | POA: Diagnosis not present

## 2023-08-26 DIAGNOSIS — D2271 Melanocytic nevi of right lower limb, including hip: Secondary | ICD-10-CM | POA: Diagnosis not present

## 2023-08-26 DIAGNOSIS — D2272 Melanocytic nevi of left lower limb, including hip: Secondary | ICD-10-CM | POA: Diagnosis not present

## 2023-08-26 DIAGNOSIS — D2261 Melanocytic nevi of right upper limb, including shoulder: Secondary | ICD-10-CM | POA: Diagnosis not present

## 2023-08-26 DIAGNOSIS — D225 Melanocytic nevi of trunk: Secondary | ICD-10-CM | POA: Diagnosis not present

## 2023-08-26 NOTE — Telephone Encounter (Signed)
Pt.notified

## 2023-08-30 ENCOUNTER — Ambulatory Visit: Payer: Self-pay | Admitting: *Deleted

## 2023-08-30 NOTE — Patient Outreach (Signed)
  Care Coordination   08/30/2023 Name: Elizabeth Malone MRN: [id number] DOB: Jan 09, 1951   Care Coordination Outreach Attempts:  An unsuccessful telephone outreach was attempted for a scheduled appointment today.  Follow Up Plan:  Additional outreach attempts will be made to offer the patient care coordination information and services.   Encounter Outcome:  No Answer   Care Coordination Interventions:  No, not indicated    Rodney Langton, RN, MSN, CCM [place]  [facility], [facility] Health RN Care Coordinator Direct Dial: [phone] / Main [phone] Fax [phone] Email: [email or web address]: [email or web address]

## 2023-09-03 ENCOUNTER — Telehealth: Payer: Self-pay | Admitting: *Deleted

## 2023-09-03 NOTE — Patient Outreach (Signed)
  Care Coordination   Follow Up Visit Note   09/03/2023 Name: Elizabeth Malone MRN: [id number] DOB: 03-Sep-1951  Elizabeth Malone is a 72 y.o. year old female who sees [place], Corrie Dandy T, NP for primary care. I spoke with  Elizabeth Malone by phone today.  What matters to the patients health and wellness today?  Denies having suicidal ideations today, mood has been better in the last week.  Will continue to work with counselors.    Goals Addressed             This Visit's Progress    Management of chronic medical conditions   On track      Interventions Today    Flowsheet Row Most Recent Value  Chronic Disease   Chronic disease during today's visit Hypertension (HTN), Other  [anxiety/depression]  General Interventions   General Interventions Discussed/Reviewed General Interventions Reviewed, Doctor Visits  Doctor Visits Discussed/Reviewed Doctor Visits Reviewed, PCP, Specialist  [upcoming with PCP and neuro on 1/15]  PCP/Specialist Visits Compliance with follow-up visit  Education Interventions   Education Provided Provided Education  Provided Verbal Education On Medication, When to see the doctor, Mental Health/Coping with Illness  [Now taking Wellbutrin, but state she is waking up almost every night at 4am.  Will continue to take and see if she will adjust]  Mental Health Interventions   Mental Health Discussed/Reviewed Anxiety, Depression, Mental Health Reviewed  [report mood is much better with Wellbutrin]              SDOH assessments and interventions completed:  No     Care Coordination Interventions:  Yes, provided   Follow up plan: Follow up call scheduled for 1/17    Encounter Outcome:  Patient Visit Completed   Rodney Langton, RN, MSN, CCM [place]  [facility], [facility] Health RN Care Coordinator Direct Dial: [phone] / Main [phone] Fax [phone] Email: [email or web address] Website: [email or web address]

## 2023-09-10 ENCOUNTER — Other Ambulatory Visit: Payer: Self-pay | Admitting: Nurse Practitioner

## 2023-09-15 NOTE — Telephone Encounter (Signed)
 Requested Prescriptions  Pending Prescriptions Disp Refills   rosuvastatin  (CRESTOR ) 40 MG tablet [Pharmacy Med Name: Rosuvastatin  Calcium  Oral Tablet 40 MG] 90 tablet 2    Sig: TAKE 1 TABLET DAILY. STOP ATORVASTATIN  AND START THIS MEDICATION.     Cardiovascular:  Antilipid - Statins 2 Failed - 09/15/2023 11:49 AM      Failed - Cr in normal range and within 360 days    Creatinine, Ser  Date Value Ref Range Status  04/14/2023 1.34 (H) 0.57 - 1.00 mg/dL Final         Failed - Lipid Panel in normal range within the last 12 months    Cholesterol, Total  Date Value Ref Range Status  03/29/2023 149 100 - 199 mg/dL Final   Cholesterol Piccolo, Waived  Date Value Ref Range Status  08/17/2017 167 <200 mg/dL Final    Comment:                            Desirable                <200                         Borderline High      200- 239                         High                     >239    LDL Chol Calc (NIH)  Date Value Ref Range Status  03/29/2023 73 0 - 99 mg/dL Final   HDL  Date Value Ref Range Status  03/29/2023 51 >39 mg/dL Final   Triglycerides  Date Value Ref Range Status  03/29/2023 147 0 - 149 mg/dL Final   Triglycerides Piccolo,Waived  Date Value Ref Range Status  08/17/2017 195 (H) <150 mg/dL Final    Comment:                            Normal                   <150                         Borderline High     150 - 199                         High                200 - 499                         Very High                >499          Passed - Patient is not pregnant      Passed - Valid encounter within last 12 months    Recent Outpatient Visits           1 month ago Bipolar 1 disorder, depressed, partial remission (HCC)   [facility] [place], [place] T, NP   5 months ago Medicare annual wellness visit, subsequent   [facility] [place], Melanie DASEN, NP  1 year ago Chronic pain of left knee   [place]  [facility] [place], Melanie T, NP   1 year ago Bipolar 1 disorder, depressed, partial remission (HCC)   [facility] [place], Melanie T, NP   1 year ago Cat bite of left lower leg with infection, subsequent encounter   [facility] [place], Erin E, PA-C       Future Appointments             In 2 weeks [place], Jolene T, NP [facility]

## 2023-09-24 ENCOUNTER — Telehealth: Payer: Medicare PPO | Admitting: Psychiatry

## 2023-09-25 NOTE — Patient Instructions (Signed)
 Due for bone density and mammogram in March 2025: Please call to schedule your mammogram and/or bone density: [facility] at [facility]  Address: [address], [place], [place] [address] Phone: [phone]  [facility] at [facility] [address] [place],  [place]  [address] Phone: [phone]    Heart-Healthy Eating Plan Many factors influence your heart health, including eating and exercise habits. Heart health is also called coronary health. Coronary risk increases with abnormal blood fat (lipid) levels. A heart-healthy eating plan includes limiting unhealthy fats, increasing healthy fats, limiting salt (sodium) intake, and making other diet and lifestyle changes. What is my plan? Your health care provider may recommend that: You limit your fat intake to _________% or less of your total calories each day. You limit your saturated fat intake to _________% or less of your total calories each day. You limit the amount of cholesterol in your diet to less than _________ mg per day. You limit the amount of sodium in your diet to less than _________ mg per day. What are tips for following this plan? Cooking Cook foods using methods other than frying. Baking, boiling, grilling, and broiling are all good options. Other ways to reduce fat include: Removing the skin from poultry. Removing all visible fats from meats. Steaming vegetables in water  or broth. Meal planning  At meals, imagine dividing your plate into fourths: Fill one-half of your plate with vegetables and green salads. Fill one-fourth of your plate with whole grains. Fill one-fourth of your plate with lean protein foods. Eat 2-4 cups of vegetables per day. One cup of vegetables equals 1 cup (91 g) broccoli or cauliflower florets, 2 medium carrots, 1 large Sabella pepper, 1 large sweet potato, 1 large tomato, 1 medium white potato, 2 cups (150 g) raw leafy greens. Eat 1-2 cups  of fruit per day. One cup of fruit equals 1 small apple, 1 large banana, 1 cup (237 g) mixed fruit, 1 large orange,  cup (82 g) dried fruit, 1 cup (240 mL) 100% fruit juice. Eat more foods that contain soluble fiber. Examples include apples, broccoli, carrots, beans, peas, and barley. Aim to get 25-30 g of fiber per day. Increase your consumption of legumes, nuts, and seeds to 4-5 servings per week. One serving of dried beans or legumes equals  cup (90 g) cooked, 1 serving of nuts is  oz (12 almonds, 24 pistachios, or 7 walnut halves), and 1 serving of seeds equals  oz (8 g). Fats Choose healthy fats more often. Choose monounsaturated and polyunsaturated fats, such as olive and canola oils, avocado oil, flaxseeds, walnuts, almonds, and seeds. Eat more omega-3 fats. Choose salmon, mackerel, sardines, tuna, flaxseed oil, and ground flaxseeds. Aim to eat fish at least 2 times each week. Check food labels carefully to identify foods with trans fats or high amounts of saturated fat. Limit saturated fats. These are found in animal products, such as meats, butter, and cream. Plant sources of saturated fats include palm oil, palm kernel oil, and coconut oil. Avoid foods with partially hydrogenated oils in them. These contain trans fats. Examples are stick margarine, some tub margarines, cookies, crackers, and other baked goods. Avoid fried foods. General information Eat more home-cooked food and less restaurant, buffet, and fast food. Limit or avoid alcohol. Limit foods that are high in added sugar and simple starches such as foods made using white refined flour (white breads, pastries, sweets). Lose weight if you are overweight.  Losing just 5-10% of your body weight can help your overall health and prevent diseases such as diabetes and heart disease. Monitor your sodium intake, especially if you have high blood pressure. Talk with your health care provider about your sodium intake. Try to incorporate  more vegetarian meals weekly. What foods should I eat? Fruits All fresh, canned (in natural juice), or frozen fruits. Vegetables Fresh or frozen vegetables (raw, steamed, roasted, or grilled). Green salads. Grains Most grains. Choose whole wheat and whole grains most of the time. Rice and pasta, including brown rice and pastas made with whole wheat. Meats and other proteins Lean, well-trimmed beef, veal, pork, and lamb. Chicken and [place] without skin. All fish and shellfish. Wild duck, rabbit, pheasant, and venison. Egg whites or low-cholesterol egg substitutes. Dried beans, peas, lentils, and tofu. Seeds and most nuts. Dairy Low-fat or nonfat cheeses, including ricotta and mozzarella. Skim or 1% milk (liquid, powdered, or evaporated). Buttermilk made with low-fat milk. Nonfat or low-fat yogurt. Fats and oils Non-hydrogenated (trans-free) margarines. Vegetable oils, including soybean, sesame, sunflower, olive, avocado, peanut, safflower, corn, canola, and cottonseed. Salad dressings or mayonnaise made with a vegetable oil. Beverages Water  (mineral or sparkling). Coffee and tea. Unsweetened ice tea. Diet beverages. Sweets and desserts Sherbet, gelatin, and fruit ice. Small amounts of dark chocolate. Limit all sweets and desserts. Seasonings and condiments All seasonings and condiments. The items listed above may not be a complete list of foods and beverages you can eat. Contact a dietitian for more options. What foods should I avoid? Fruits Canned fruit in heavy syrup. Fruit in cream or butter sauce. Fried fruit. Limit coconut. Vegetables Vegetables cooked in cheese, cream, or butter sauce. Fried vegetables. Grains Breads made with saturated or trans fats, oils, or whole milk. Croissants. Sweet rolls. Donuts. High-fat crackers, such as cheese crackers and chips. Meats and other proteins Fatty meats, such as hot dogs, ribs, sausage, bacon, rib-eye roast or steak. High-fat deli meats,  such as salami and bologna. Caviar. Domestic duck and goose. Organ meats, such as liver. Dairy Cream, sour cream, cream cheese, and creamed cottage cheese. Whole-milk cheeses. Whole or 2% milk (liquid, evaporated, or condensed). Whole buttermilk. Cream sauce or high-fat cheese sauce. Whole-milk yogurt. Fats and oils Meat fat, or shortening. Cocoa butter, hydrogenated oils, palm oil, coconut oil, palm kernel oil. Solid fats and shortenings, including bacon fat, salt pork, lard, and butter. Nondairy cream substitutes. Salad dressings with cheese or sour cream. Beverages Regular sodas and any drinks with added sugar. Sweets and desserts Frosting. Pudding. Cookies. Cakes. Pies. Milk chocolate or white chocolate. Buttered syrups. Full-fat ice cream or ice cream drinks. The items listed above may not be a complete list of foods and beverages to avoid. Contact a dietitian for more information. Summary Heart-healthy meal planning includes limiting unhealthy fats, increasing healthy fats, limiting salt (sodium) intake and making other diet and lifestyle changes. Lose weight if you are overweight. Losing just 5-10% of your body weight can help your overall health and prevent diseases such as diabetes and heart disease. Focus on eating a balance of foods, including fruits and vegetables, low-fat or nonfat dairy, lean protein, nuts and legumes, whole grains, and heart-healthy oils and fats. This information is not intended to replace advice given to you by your health care provider. Make sure you discuss any questions you have with your health care provider. Document Revised: 10/06/2021 Document Reviewed: 10/06/2021 Elsevier Patient Education  2024 [employer].

## 2023-09-28 DIAGNOSIS — M25612 Stiffness of left shoulder, not elsewhere classified: Secondary | ICD-10-CM | POA: Diagnosis not present

## 2023-09-28 DIAGNOSIS — M6281 Muscle weakness (generalized): Secondary | ICD-10-CM | POA: Diagnosis not present

## 2023-09-28 DIAGNOSIS — M25512 Pain in left shoulder: Secondary | ICD-10-CM | POA: Diagnosis not present

## 2023-09-28 DIAGNOSIS — M19012 Primary osteoarthritis, left shoulder: Secondary | ICD-10-CM | POA: Diagnosis not present

## 2023-09-29 ENCOUNTER — Ambulatory Visit: Payer: Medicare PPO | Admitting: Nurse Practitioner

## 2023-09-29 ENCOUNTER — Encounter: Payer: Self-pay | Admitting: Nurse Practitioner

## 2023-09-29 VITALS — BP 102/68 | HR 60 | Temp 98.4°F | Ht 63.1 in | Wt 184.0 lb

## 2023-09-29 DIAGNOSIS — M85852 Other specified disorders of bone density and structure, left thigh: Secondary | ICD-10-CM

## 2023-09-29 DIAGNOSIS — Z87898 Personal history of other specified conditions: Secondary | ICD-10-CM

## 2023-09-29 DIAGNOSIS — E538 Deficiency of other specified B group vitamins: Secondary | ICD-10-CM | POA: Insufficient documentation

## 2023-09-29 DIAGNOSIS — I10 Essential (primary) hypertension: Secondary | ICD-10-CM | POA: Diagnosis not present

## 2023-09-29 DIAGNOSIS — R7301 Impaired fasting glucose: Secondary | ICD-10-CM

## 2023-09-29 DIAGNOSIS — R200 Anesthesia of skin: Secondary | ICD-10-CM | POA: Diagnosis not present

## 2023-09-29 DIAGNOSIS — R202 Paresthesia of skin: Secondary | ICD-10-CM | POA: Diagnosis not present

## 2023-09-29 DIAGNOSIS — F419 Anxiety disorder, unspecified: Secondary | ICD-10-CM

## 2023-09-29 DIAGNOSIS — G3184 Mild cognitive impairment, so stated: Secondary | ICD-10-CM | POA: Diagnosis not present

## 2023-09-29 DIAGNOSIS — E782 Mixed hyperlipidemia: Secondary | ICD-10-CM

## 2023-09-29 DIAGNOSIS — Z832 Family history of diseases of the blood and blood-forming organs and certain disorders involving the immune mechanism: Secondary | ICD-10-CM | POA: Insufficient documentation

## 2023-09-29 DIAGNOSIS — Z1231 Encounter for screening mammogram for malignant neoplasm of breast: Secondary | ICD-10-CM

## 2023-09-29 DIAGNOSIS — G5793 Unspecified mononeuropathy of bilateral lower limbs: Secondary | ICD-10-CM

## 2023-09-29 DIAGNOSIS — F439 Reaction to severe stress, unspecified: Secondary | ICD-10-CM | POA: Diagnosis not present

## 2023-09-29 DIAGNOSIS — F319 Bipolar disorder, unspecified: Secondary | ICD-10-CM | POA: Diagnosis not present

## 2023-09-29 DIAGNOSIS — F3175 Bipolar disorder, in partial remission, most recent episode depressed: Secondary | ICD-10-CM | POA: Diagnosis not present

## 2023-09-29 DIAGNOSIS — E66812 Obesity, class 2: Secondary | ICD-10-CM

## 2023-09-29 MED ORDER — BENAZEPRIL HCL 5 MG PO TABS: 5.0000 mg | ORAL_TABLET | Freq: Every day | ORAL | 4 refills | Status: DC

## 2023-09-29 MED ORDER — OXYBUTYNIN CHLORIDE ER 10 MG PO TB24: ORAL_TABLET | ORAL | 4 refills | Status: DC

## 2023-09-29 MED ORDER — METOPROLOL SUCCINATE ER 50 MG PO TB24: 50.0000 mg | ORAL_TABLET | Freq: Every day | ORAL | 4 refills | Status: DC

## 2023-09-29 MED ORDER — ROSUVASTATIN CALCIUM 40 MG PO TABS: 40.0000 mg | ORAL_TABLET | Freq: Every day | ORAL | 4 refills | Status: DC

## 2023-09-29 MED ORDER — FENOFIBRATE 145 MG PO TABS: 145.0000 mg | ORAL_TABLET | Freq: Every day | ORAL | 4 refills | Status: AC

## 2023-09-29 NOTE — Assessment & Plan Note (Signed)
 Chronic, ongoing.  Continue current medication regimen and adjust as needed.  Check labs today.

## 2023-09-29 NOTE — Assessment & Plan Note (Signed)
BMI 32.49.  Recommended eating smaller high protein, low fat meals more frequently and exercising 30 mins a day 5 times a week with a goal of 10-15lb weight loss in the next 3 months. Patient voiced their understanding and motivation to adhere to these recommendations.

## 2023-09-29 NOTE — Assessment & Plan Note (Signed)
 Continue to collaborate with therapist.  She has been away from husband now for several months.

## 2023-09-29 NOTE — Assessment & Plan Note (Signed)
 Chronic, taking supplement daily.  Last level 256.  Recheck today and adjust supplement as needed.

## 2023-09-29 NOTE — Assessment & Plan Note (Signed)
 Chronic, ongoing.  Followed by psychiatry.  Continue current medication regimen as prescribed by them. Denies SI/HI at this time.  Continue collaboration with psychiatry and therapist.  She is aware if intrusive thoughts present to immediately go to ER, has a safety plan in place.

## 2023-09-29 NOTE — Assessment & Plan Note (Signed)
 Chronic.  Taking B12 supplement daily.  Discussed at length with her. Continue to collaborate with neurology, recent note reviewed.  B12 level today.
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
## 2023-09-29 NOTE — Assessment & Plan Note (Signed)
Refer to Bipolar plan of care. 

## 2023-09-29 NOTE — Assessment & Plan Note (Signed)
 Chronic, stable with BP well below goal, on lower side and gets dizzy with position changes.  Reduce Benazepril  to 5 MG and maintain Metoprolol  for now.  Recommend she monitor BP at least a few mornings a week at home and document.  DASH diet at home.  Continue current medication regimen and adjust as needed.  Labs today: CMP. Urine ALB 13 April 2023, maintain ACE on board.

## 2023-09-29 NOTE — Assessment & Plan Note (Signed)
Chronic, ongoing, noted on DEXA 08/27/2019.  Recommend continue daily Calcium and Vitamin D supplement.  Weight bearing exercises at home.  Check Vit D today.  Repeat DEXA around 08/26/2024.

## 2023-09-30 DIAGNOSIS — M6281 Muscle weakness (generalized): Secondary | ICD-10-CM | POA: Diagnosis not present

## 2023-09-30 DIAGNOSIS — M25612 Stiffness of left shoulder, not elsewhere classified: Secondary | ICD-10-CM | POA: Diagnosis not present

## 2023-09-30 DIAGNOSIS — M25512 Pain in left shoulder: Secondary | ICD-10-CM | POA: Diagnosis not present

## 2023-09-30 DIAGNOSIS — M19012 Primary osteoarthritis, left shoulder: Secondary | ICD-10-CM | POA: Diagnosis not present

## 2023-09-30 LAB — COMPREHENSIVE METABOLIC PANEL
ALT: 20 [IU]/L (ref 0–32)
AST: 21 [IU]/L (ref 0–40)
Albumin: 4.5 g/dL (ref 3.8–4.8)
Alkaline Phosphatase: 77 [IU]/L (ref 44–121)
BUN/Creatinine Ratio: 15 (ref 12–28)
BUN: 20 mg/dL (ref 8–27)
Bilirubin Total: 0.4 mg/dL (ref 0.0–1.2)
CO2: 20 mmol/L (ref 20–29)
Calcium: 10 mg/dL (ref 8.7–10.3)
Chloride: 104 mmol/L (ref 96–106)
Creatinine, Ser: 1.31 mg/dL — ABNORMAL HIGH (ref 0.57–1.00)
Globulin, Total: 2.1 g/dL (ref 1.5–4.5)
Glucose: 93 mg/dL (ref 70–99)
Potassium: 5.2 mmol/L (ref 3.5–5.2)
Sodium: 141 mmol/L (ref 134–144)
Total Protein: 6.6 g/dL (ref 6.0–8.5)
eGFR: 43 mL/min/{1.73_m2} — ABNORMAL LOW (ref >59)

## 2023-09-30 LAB — LIPID PANEL W/O CHOL/HDL RATIO
Cholesterol, Total: 136 mg/dL (ref 100–199)
HDL: 62 mg/dL (ref >39)
LDL Chol Calc (NIH): 59 mg/dL (ref 0–99)
Triglycerides: 77 mg/dL (ref 0–149)
VLDL Cholesterol Cal: 15 mg/dL (ref 5–40)

## 2023-09-30 LAB — HEMOGLOBIN A1C
Est. average glucose Bld gHb Est-mCnc: 117 mg/dL
Hgb A1c MFr Bld: 5.7 % — ABNORMAL HIGH (ref 4.8–5.6)

## 2023-09-30 LAB — VITAMIN B12: Vitamin B-12: 744 pg/mL (ref 232–1245)

## 2023-09-30 LAB — VITAMIN D 25 HYDROXY (VIT D DEFICIENCY, FRACTURES): Vit D, 25-Hydroxy: 41.2 ng/mL (ref 30.0–100.0)

## 2023-09-30 NOTE — Progress Notes (Signed)
Contacted via MyChart   Good morning Arizbeth, your labs have returned and overall remain at baseline for you: - Kidney function, creatinine and eGFR, continues to show Stage 3b kidney disease with no worsening and liver function, AST and ALT, is normal. - A1c continues to show prediabetes with no worsening.  Continue focus on healthy diet and regular activity. - Remainder of labs look great.  No medication changes needed.  Any questions? Keep being amazing!!  Thank you for allowing me to participate in your care.  I appreciate you. Kindest regards, Drea Jurewicz

## 2023-10-01 ENCOUNTER — Ambulatory Visit: Payer: Self-pay | Admitting: *Deleted

## 2023-10-01 NOTE — Patient Instructions (Signed)
Visit Information  Thank you for taking time to visit with me today. Please don't hesitate to contact me if I can be of assistance to you before our next scheduled telephone appointment.  Following are the goals we discussed today:  Call [facility] at [phone] to schedule appointment.   Our next appointment is by telephone on 2/14  Please call the care guide team at [phone] if you need to cancel or reschedule your appointment.   Please call the Suicide and Crisis Lifeline: 988 call the [place] National Suicide Prevention Lifeline: (303)537-0648 or TTY: 706-444-7921 TTY (225)143-5569) to talk to a trained counselor call 1-800-273-TALK (toll free, 24 hour hotline) call 911 if you are experiencing a Mental Health or Behavioral Health Crisis or need someone to talk to.  Patient verbalizes understanding of instructions and care plan provided today and agrees to view in MyChart. Active MyChart status and patient understanding of how to access instructions and care plan via MyChart confirmed with patient.     The patient has been provided with contact information for the care management team and has been advised to call with any health related questions or concerns.   Rodney Langton, RN, MSN, CCM [facility], [facility] RN Care Coordinator Direct Dial: [phone] / Main [phone] Fax [phone] Email: [email or web address] Website: [email or web address]

## 2023-10-01 NOTE — Patient Outreach (Signed)
  Care Coordination   Follow Up Visit Note   10/01/2023 Name: CHRISTABEL MAHMOUD MRN: [id number] DOB: 1951/09/05  [name] is a 73 y.o. year old female who sees [place], Corrie Dandy T, NP for primary care. I spoke with  [name] by phone today.  What matters to the patients health and wellness today?  Patient report she had a bad day last week, but her friend was supportive and she has been much better since with her depression.  Denies any urgent concerns, encouraged to contact this care manager with questions.      Goals Addressed             This Visit's Progress    Management of chronic medical conditions   On track    Interventions Today    Flowsheet Row Most Recent Value  Chronic Disease   Chronic disease during today's visit Hypertension (HTN), Other  [anxiety and depression]  General Interventions   General Interventions Discussed/Reviewed General Interventions Reviewed, Doctor Visits, Communication with  Doctor Visits Discussed/Reviewed Doctor Visits Reviewed, PCP, Specialist  [Reviewed upcoming: [facility] 1/23, surgery for blepharoplasty on 2/6, ENT 2/11, podiatry 2/17]  PCP/Specialist Visits Compliance with follow-up visit  [Completed appointments with PCP and neurology on 1/15]  Communication with PCP/Specialists  [Referral was sent to [facility] back in Nov, pt state she has not received call.  Call placed to clinic, notified that they tried to reach patient 3 times, referral still active, pt to call for appointment]  Exercise Interventions   Exercise Discussed/Reviewed Physical Activity  Physical Activity Discussed/Reviewed Physical Activity Reviewed, Gym  [Patient Increasing activity, attending water aerobic classes]  Education Interventions   Education Provided Provided Education  Provided Verbal Education On When to see the doctor, Mental Health/Coping with Illness, Medication, Blood Sugar Monitoring  [Antihypertensive decreased due to low BP, encouraged to monitor  more closely. Has been taking Wellbutrin, feels this has helped mood.]  Mental Health Interventions   Mental Health Discussed/Reviewed Coping Strategies, Mental Health Reviewed, Depression, Anxiety  [Patient becoming more active in activities in the community, encouraged to continue to help with anxiety and depression]              SDOH assessments and interventions completed:  No     Care Coordination Interventions:  Yes, provided   Follow up plan: Follow up call scheduled for 2/14    Encounter Outcome:  Patient Visit Completed   Rodney Langton, RN, MSN, CCM [place]  [facility], [facility] RN Care Coordinator Direct Dial: [phone] / Main [phone] Fax [phone] Email: [email or web address]: [email or web address]
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
## 2023-10-07 ENCOUNTER — Telehealth: Payer: Medicare PPO | Admitting: Psychiatry

## 2023-10-07 DIAGNOSIS — M19012 Primary osteoarthritis, left shoulder: Secondary | ICD-10-CM | POA: Diagnosis not present

## 2023-10-07 DIAGNOSIS — M25612 Stiffness of left shoulder, not elsewhere classified: Secondary | ICD-10-CM | POA: Diagnosis not present

## 2023-10-07 DIAGNOSIS — M6281 Muscle weakness (generalized): Secondary | ICD-10-CM | POA: Diagnosis not present

## 2023-10-07 DIAGNOSIS — M25512 Pain in left shoulder: Secondary | ICD-10-CM | POA: Diagnosis not present

## 2023-10-12 DIAGNOSIS — F332 Major depressive disorder, recurrent severe without psychotic features: Secondary | ICD-10-CM | POA: Diagnosis not present

## 2023-10-12 DIAGNOSIS — F410 Panic disorder [episodic paroxysmal anxiety] without agoraphobia: Secondary | ICD-10-CM | POA: Diagnosis not present

## 2023-10-12 DIAGNOSIS — F411 Generalized anxiety disorder: Secondary | ICD-10-CM | POA: Diagnosis not present

## 2023-10-14 DIAGNOSIS — M19012 Primary osteoarthritis, left shoulder: Secondary | ICD-10-CM | POA: Diagnosis not present

## 2023-10-14 DIAGNOSIS — M6281 Muscle weakness (generalized): Secondary | ICD-10-CM | POA: Diagnosis not present

## 2023-10-14 DIAGNOSIS — M25612 Stiffness of left shoulder, not elsewhere classified: Secondary | ICD-10-CM | POA: Diagnosis not present

## 2023-10-14 DIAGNOSIS — M25512 Pain in left shoulder: Secondary | ICD-10-CM | POA: Diagnosis not present

## 2023-10-21 DIAGNOSIS — F419 Anxiety disorder, unspecified: Secondary | ICD-10-CM | POA: Diagnosis not present

## 2023-10-21 DIAGNOSIS — Z885 Allergy status to narcotic agent status: Secondary | ICD-10-CM | POA: Diagnosis not present

## 2023-10-21 DIAGNOSIS — L987 Excessive and redundant skin and subcutaneous tissue: Secondary | ICD-10-CM | POA: Diagnosis not present

## 2023-10-21 DIAGNOSIS — Z880 Allergy status to penicillin: Secondary | ICD-10-CM | POA: Diagnosis not present

## 2023-10-21 DIAGNOSIS — H02403 Unspecified ptosis of bilateral eyelids: Secondary | ICD-10-CM | POA: Diagnosis not present

## 2023-10-21 DIAGNOSIS — H02834 Dermatochalasis of left upper eyelid: Secondary | ICD-10-CM | POA: Diagnosis not present

## 2023-10-21 DIAGNOSIS — H02831 Dermatochalasis of right upper eyelid: Secondary | ICD-10-CM | POA: Diagnosis not present

## 2023-10-21 DIAGNOSIS — H53453 Other localized visual field defect, bilateral: Secondary | ICD-10-CM | POA: Diagnosis not present

## 2023-10-29 ENCOUNTER — Ambulatory Visit: Payer: Self-pay | Admitting: *Deleted

## 2023-10-29 NOTE — Patient Instructions (Signed)
Visit Information  Thank you for taking time to visit with me today. Please don't hesitate to contact me if I can be of assistance to you before our next scheduled telephone appointment.  Following are the goals we discussed today:  Continue to alter antidepressants as instructed by [facility] team. Ask family for help with getting belongings from home.   Our next appointment is by telephone on 4/17  Please call the care guide team at [phone] if you need to cancel or reschedule your appointment.   Please call the Suicide and Crisis Lifeline: 988 call the [place] National Suicide Prevention Lifeline: (818) 269-2989 or TTY: 917 774 1809 TTY 401-752-9455) to talk to a trained counselor call 1-800-273-TALK (toll free, 24 hour hotline) call 911 if you are experiencing a Mental Health or Behavioral Health Crisis or need someone to talk to.  Patient verbalizes understanding of instructions and care plan provided today and agrees to view in MyChart. Active MyChart status and patient understanding of how to access instructions and care plan via MyChart confirmed with patient.     The patient has been provided with contact information for the care management team and has been advised to call with any health related questions or concerns.   Rodney Langton, RN, MSN, CCM [facility], [facility] RN Care Coordinator Direct Dial: [phone] / Main [phone] Fax [phone] Email: [email or web address]: [email or web address]

## 2023-10-29 NOTE — Patient Outreach (Signed)
  Care Coordination   10/29/2023 Name: Elizabeth Malone MRN: [id number] DOB: August 22, 1951   Care Coordination Outreach Attempts:  An unsuccessful outreach was attempted for an appointment today.  Follow Up Plan:  Additional outreach attempts will be made to offer the patient complex care management information and services.   Encounter Outcome:  No Answer   Care Coordination Interventions:  No, not indicated    Rodney Langton, RN, MSN, CCM   [facility], [facility] RN Care Coordinator Direct Dial: [phone] / Main [phone] Fax [phone] Email: [email or web address]: [email or web address]

## 2023-10-29 NOTE — Patient Outreach (Signed)
  Care Coordination   Follow Up Visit Note   10/29/2023 Name: Elizabeth Malone MRN: [id number] DOB: 1950-12-24  Elizabeth Malone is a 73 y.o. year old female who sees [place], Corrie Dandy T, NP for primary care. I spoke with  Elizabeth Malone by phone today.  What matters to the patients health and wellness today?  Patient continues to have some life stressors, has been given a date where she can return to her home and get her belongings, report she is dealing with the situation much better at this time.  Denies any thoughts to harm self at this time.     Goals Addressed             This Visit's Progress    Management of chronic medical conditions   On track    Interventions Today    Flowsheet Row Most Recent Value  Chronic Disease   Chronic disease during today's visit Hypertension (HTN), Other  [Depression]  General Interventions   General Interventions Discussed/Reviewed General Interventions Reviewed, Doctor Visits  Doctor Visits Discussed/Reviewed Doctor Visits Reviewed, Specialist  [Upcoming 3/10 with neuro and 4/14 with nephrology]  PCP/Specialist Visits Compliance with follow-up visit  [Was able to get in with new psych team at [facility]]  Education Interventions   Education Provided Provided Education  Provided Verbal Education On Medication, When to see the doctor, Mental Health/Coping with Illness  [Medications reviewed, report taking as instructed.  Well Butrin has been decreased, state she feels stuttering has improved.]  Mental Health Interventions   Mental Health Discussed/Reviewed Mental Health Reviewed, Anxiety, Depression  [Report per new psych, she was taking too many antidepressants, slowly decreasing and feeling like conditions are better managed now. Encouraged to continue therapy/treatment, will see face to face next visit]              SDOH assessments and interventions completed:  No     Care Coordination Interventions:  Yes, provided   Follow up plan: Follow up  call scheduled for 4/17    Encounter Outcome:  Patient Visit Completed   Rodney Langton, RN, MSN, CCM [place]  [facility], [facility] RN Care Coordinator Direct Dial: [phone] / Main [phone] Fax [phone] Email: [email or web address] Website: [email or web address]

## 2023-11-01 ENCOUNTER — Ambulatory Visit: Payer: Medicare PPO | Admitting: Podiatry

## 2023-11-12 DIAGNOSIS — F411 Generalized anxiety disorder: Secondary | ICD-10-CM | POA: Diagnosis not present

## 2023-11-12 DIAGNOSIS — F410 Panic disorder [episodic paroxysmal anxiety] without agoraphobia: Secondary | ICD-10-CM | POA: Diagnosis not present

## 2023-11-12 DIAGNOSIS — F332 Major depressive disorder, recurrent severe without psychotic features: Secondary | ICD-10-CM | POA: Diagnosis not present

## 2023-12-28 ENCOUNTER — Other Ambulatory Visit: Payer: Self-pay | Admitting: Nephrology

## 2023-12-28 DIAGNOSIS — N281 Cyst of kidney, acquired: Secondary | ICD-10-CM

## 2023-12-30 ENCOUNTER — Ambulatory Visit: Payer: Self-pay | Admitting: *Deleted

## 2023-12-30 NOTE — Patient Outreach (Signed)
 Complex Care Management   Visit Note  12/30/2023  Name:  Elizabeth Malone MRN: [id number] DOB: 04-16-1951  Situation: Referral received for Complex Care Management related to  HTN and anxiety  I obtained verbal consent from Patient.  Visit completed with patient  on the phone  Background:   Past Medical History:  Diagnosis Date   Anxiety    Arthritis    Bipolar disorder (HCC)    CKD (chronic kidney disease) stage 3, GFR 30-59 ml/min (HCC)    Complication of anesthesia    Depression    GERD (gastroesophageal reflux disease)    occasionally has to use tums   Headache    History of diverticulosis    Hyperlipidemia    Hypertension    Macular degeneration    Overactive bladder    PONV (postoperative nausea and vomiting)    PTSD (post-traumatic stress disorder)     Assessment: Patient Reported Symptoms:  Cognitive Cognitive Status: Alert and oriented to person, place, and time, Normal speech and language skills      Neurological      HEENT HEENT Symptoms Reported: Not assessed      Cardiovascular Cardiovascular Symptoms Reported: Not assessed    Respiratory Respiratory Symptoms Reported: Not assesed    Endocrine Patient reports the following symptoms related to hypoglycemia or hyperglycemia : Not assessed    Gastrointestinal Gastrointestinal Symptoms Reported: Not assessed      Genitourinary      Integumentary Integumentary Symptoms Reported: Not assessed    Musculoskeletal Musculoskelatal Symptoms Reviewed: Not assessed        Psychosocial Psychosocial Symptoms Reported: Not assessed            09/29/2023    9:02 AM  Depression screen PHQ 2/9  Decreased Interest 1  Down, Depressed, Hopeless 1  PHQ - 2 Score 2  Altered sleeping 1  Tired, decreased energy 1  Change in appetite 3  Feeling bad or failure about yourself  2  Trouble concentrating 2  Moving slowly or fidgety/restless 2  Suicidal thoughts 1  PHQ-9 Score 14  Difficult doing work/chores Very  difficult    There were no vitals filed for this visit.  Medications Reviewed Today   Medications were not reviewed in this encounter     Recommendation:   PCP Follow-up  Follow Up Plan:   Case closed, patient declines further involvement.   Holland Lundborg, RN, MSN, CCM [facility], [facility] RN Care Coordinator Direct Dial: [phone] / Main [phone] Fax [phone] Email: [email or web address]: [email or web address]

## 2024-01-28 ENCOUNTER — Ambulatory Visit
Admission: RE | Admit: 2024-01-28 | Discharge: 2024-01-28 | Disposition: A | Discharge status: Home / Self Care | Source: Ambulatory Visit | Attending: Nephrology | Admitting: Nephrology

## 2024-01-28 DIAGNOSIS — N281 Cyst of kidney, acquired: Secondary | ICD-10-CM | POA: Insufficient documentation

## 2024-02-10 ENCOUNTER — Ambulatory Visit
Admission: RE | Admit: 2024-02-10 | Discharge: 2024-02-10 | Discharge status: Home / Self Care | Source: Ambulatory Visit | Attending: Nurse Practitioner | Admitting: Nurse Practitioner

## 2024-02-10 ENCOUNTER — Ambulatory Visit
Admission: RE | Admit: 2024-02-10 | Discharge: 2024-02-10 | Disposition: A | Discharge status: Home / Self Care | Source: Ambulatory Visit | Attending: Nurse Practitioner | Admitting: Nurse Practitioner

## 2024-02-10 DIAGNOSIS — Z1231 Encounter for screening mammogram for malignant neoplasm of breast: Secondary | ICD-10-CM | POA: Diagnosis present

## 2024-02-10 DIAGNOSIS — M85852 Other specified disorders of bone density and structure, left thigh: Secondary | ICD-10-CM | POA: Diagnosis present

## 2024-02-14 ENCOUNTER — Ambulatory Visit: Payer: Self-pay | Admitting: Nurse Practitioner

## 2024-02-14 NOTE — Progress Notes (Signed)
 Contacted via MyChart   Normal mammogram, may repeat in one year:)

## 2024-02-14 NOTE — Progress Notes (Signed)
 Contacted via MyChart   Your bone density shows thinning bones (osteopenia) but not brittle (osteoporosis). We recommend Vitamin D supplementation of about 2,0000 IUs of over the counter Vitamin D3. In addition, we recommend a diet high in calcium with dairy and dark green leafy vegetables. We would like you to get plenty of weight bearing exercises with walking and resistance training such as light weights or resistance bands available with instructions at places such as [employer].

## 2024-04-10 NOTE — Patient Instructions (Incomplete)

## 2024-04-12 ENCOUNTER — Other Ambulatory Visit

## 2024-04-12 DIAGNOSIS — N179 Acute kidney failure, unspecified: Secondary | ICD-10-CM

## 2024-04-14 ENCOUNTER — Ambulatory Visit: Payer: Self-pay | Admitting: Nurse Practitioner

## 2024-04-14 DIAGNOSIS — M85852 Other specified disorders of bone density and structure, left thigh: Secondary | ICD-10-CM

## 2024-04-14 DIAGNOSIS — E782 Mixed hyperlipidemia: Secondary | ICD-10-CM

## 2024-04-14 DIAGNOSIS — F419 Anxiety disorder, unspecified: Secondary | ICD-10-CM

## 2024-04-14 DIAGNOSIS — F3175 Bipolar disorder, in partial remission, most recent episode depressed: Secondary | ICD-10-CM

## 2024-04-14 DIAGNOSIS — I10 Essential (primary) hypertension: Secondary | ICD-10-CM

## 2024-05-04 ENCOUNTER — Encounter: Payer: Self-pay | Admitting: Nurse Practitioner

## 2024-05-09 NOTE — Telephone Encounter (Signed)
 Second attempt to reach patient to get rescheduled for appt that was canceled

## 2024-05-10 NOTE — Telephone Encounter (Signed)
 Called patient to get her scheduled for the OV that was canceled, She stated that she already has an appt in October and she doesn't want to schedule unless you need to see her sooner. She did mention that another provider increased one of her meds recently.

## 2024-05-30 ENCOUNTER — Ambulatory Visit: Admitting: Emergency Medicine

## 2024-05-30 VITALS — Ht 65.0 in | Wt 185.0 lb

## 2024-05-30 DIAGNOSIS — Z0000 Encounter for general adult medical examination without abnormal findings: Secondary | ICD-10-CM

## 2024-05-30 NOTE — Patient Instructions (Signed)
 Elizabeth Malone,  Thank you for taking the time for your Medicare Wellness Visit. I appreciate your continued commitment to your health goals. Please review the care plan we discussed, and feel free to reach out if I can assist you further.  Medicare recommends these wellness visits once per year to help you and your care team stay ahead of potential health issues. These visits are designed to focus on prevention, allowing your provider to concentrate on managing your acute and chronic conditions during your regular appointments.  Please note that Annual Wellness Visits do not include a physical exam. Some assessments may be limited, especially if the visit was conducted virtually. If needed, we may recommend a separate in-person follow-up with your provider.  Ongoing Care Seeing your primary care provider every 3 to 6 months helps us  monitor your health and provide consistent, personalized care.   Referrals If a referral was made during today's visit and you haven't received any updates within two weeks, please contact the referred provider directly to check on the status.  Recommended Screenings: Get the flu vaccine at your convenience.  Health Maintenance  Topic Date Due   Flu Shot  04/14/2024   COVID-19 Vaccine (5 - 2025-26 season) 05/15/2024   Breast Cancer Screening  02/09/2025   Medicare Annual Wellness Visit  05/30/2025   DEXA scan (bone density measurement)  02/09/2029   Colon Cancer Screening  03/07/2030   DTaP/Tdap/Td vaccine (3 - Td or Tdap) 03/28/2033   Pneumococcal Vaccine for age over 70  Completed   Hepatitis C Screening  Completed   Zoster (Shingles) Vaccine  Completed   HPV Vaccine  Aged Out   Meningitis B Vaccine  Aged Out       05/30/2024   10:30 AM  Advanced Directives  Does Patient Have a Medical Advance Directive? No  Would patient like information on creating a medical advance directive? Yes (ED - Information included in AVS)   Advance Care Planning is important  because it: Ensures you receive medical care that aligns with your values, goals, and preferences. Provides guidance to your family and loved ones, reducing the emotional burden of decision-making during critical moments. Information on Advanced Care Planning can be found at [place]  Secretary of [facility] Advance Health Care Directives Advance Health Care Directives (http://guzman.com/)  You may also get the forms at your doctor's office.  Vision: Annual vision screenings are recommended for early detection of glaucoma, cataracts, and diabetic retinopathy. These exams can also reveal signs of chronic conditions such as diabetes and high blood pressure.  Dental: Annual dental screenings help detect early signs of oral cancer, gum disease, and other conditions linked to overall health, including heart disease and diabetes.  Please see the attached documents for additional preventive care recommendations.    Fall Prevention in the Home, Adult Falls can cause injuries and affect people of all ages. There are many simple things that you can do to make your home safe and to help prevent falls. If you need it, ask for help making these changes. What actions can I take to prevent falls? General information Use good lighting in all rooms. Make sure to: Replace any light bulbs that burn out. Turn on lights if it is dark and use night-lights. Keep items that you use often in easy-to-reach places. Lower the shelves around your home if needed. Move furniture so that there are clear paths around it. Do not keep throw rugs or other things on the floor that can make  you trip. If any of your floors are uneven, fix them. Add color or contrast paint or tape to clearly mark and help you see: Grab bars or handrails. First and last steps of staircases. Where the edge of each step is. If you use a ladder or stepladder: Make sure that it is fully opened. Do not climb a closed ladder. Make sure the sides of the  ladder are locked in place. Have someone hold the ladder while you use it. Know where your pets are as you move through your home. What can I do in the bathroom?     Keep the floor dry. Clean up any water  that is on the floor right away. Remove soap buildup in the bathtub or shower. Buildup makes bathtubs and showers slippery. Use non-skid mats or decals on the floor of the bathtub or shower. Attach bath mats securely with double-sided, non-slip rug tape. If you need to sit down while you are in the shower, use a non-slip stool. Install grab bars by the toilet and in the bathtub and shower. Do not use towel bars as grab bars. What can I do in the bedroom? Make sure that you have a light by your bed that is easy to reach. Do not use any sheets or blankets on your bed that hang to the floor. Have a firm bench or chair with side arms that you can use for support when you get dressed. What can I do in the kitchen? Clean up any spills right away. If you need to reach something above you, use a sturdy step stool that has a grab bar. Keep electrical cables out of the way. Do not use floor polish or wax that makes floors slippery. What can I do with my stairs? Do not leave anything on the stairs. Make sure that you have a light switch at the top and the bottom of the stairs. Have them installed if you do not have them. Make sure that there are handrails on both sides of the stairs. Fix handrails that are broken or loose. Make sure that handrails are as long as the staircases. Install non-slip stair treads on all stairs in your home if they do not have carpet. Avoid having throw rugs at the top or bottom of stairs, or secure the rugs with carpet tape to prevent them from moving. Choose a carpet design that does not hide the edge of steps on the stairs. Make sure that carpet is firmly attached to the stairs. Fix any carpet that is loose or worn. What can I do on the outside of my home? Use bright  outdoor lighting. Repair the edges of walkways and driveways and fix any cracks. Clear paths of anything that can make you trip, such as tools or rocks. Add color or contrast paint or tape to clearly mark and help you see high doorway thresholds. Trim any bushes or trees on the main path into your home. Check that handrails are securely fastened and in good repair. Both sides of all steps should have handrails. Install guardrails along the edges of any raised decks or porches. Have leaves, snow, and ice cleared regularly. Use sand, salt, or ice melt on walkways during winter months if you live where there is ice and snow. In the garage, clean up any spills right away, including grease or oil spills. What other actions can I take? Review your medicines with your health care provider. Some medicines can make you confused or feel dizzy.  This can increase your chance of falling. Wear closed-toe shoes that fit well and support your feet. Wear shoes that have rubber soles and low heels. Use a cane, walker, scooter, or crutches that help you move around if needed. Talk with your provider about other ways that you can decrease your risk of falls. This may include seeing a physical therapist to learn to do exercises to improve movement and strength. Where to find more information Centers for Disease Control and Prevention, [employer]: TonerPromos.no [employer] on Aging: BaseRingTones.pl National Institute on Aging: BaseRingTones.pl Contact a health care provider if: You are afraid of falling at home. You feel weak, drowsy, or dizzy at home. You fall at home. Get help right away if you: Lose consciousness or have trouble moving after a fall. Have a fall that causes a head injury. These symptoms may be an emergency. Get help right away. Call 911. Do not wait to see if the symptoms will go away. Do not drive yourself to the hospital. This information is not intended to replace advice given to you by your health  care provider. Make sure you discuss any questions you have with your health care provider. Document Revised: 05/04/2022 Document Reviewed: 05/04/2022 Elsevier Patient Education  2024 [employer].

## 2024-05-30 NOTE — Progress Notes (Signed)
 Subjective:   Elizabeth Malone is a 73 y.o. who presents for a Medicare Wellness preventive visit.  As a reminder, Annual Wellness Visits don't include a physical exam, and some assessments may be limited, especially if this visit is performed virtually. We may recommend an in-person follow-up visit with your provider if needed.  Visit Complete: Virtual I connected with  Elizabeth Malone on 05/30/24 by a audio enabled telemedicine application and verified that I am speaking with the correct person using two identifiers.  Patient Location: Home  Provider Location: Office/Clinic  I discussed the limitations of evaluation and management by telemedicine. The patient expressed understanding and agreed to proceed.  Vital Signs: Because this visit was a virtual/telehealth visit, some criteria may be missing or patient reported. Any vitals not documented were not able to be obtained and vitals that have been documented are patient reported.  VideoDeclined- This patient declined Librarian, academic. Therefore the visit was completed with audio only.  Persons Participating in Visit: Patient.  AWV Questionnaire: No: Patient Medicare AWV questionnaire was not completed prior to this visit.  Cardiac Risk Factors include: advanced age (>22men, >84 women);dyslipidemia;hypertension;obesity (BMI >30kg/m2)     Objective:    Today's Vitals   05/30/24 1010  Weight: 185 lb (83.9 kg)  Height: 5' 5 (1.651 m)  PainSc: 4    Body mass index is 30.79 kg/m.     05/30/2024   10:30 AM 07/16/2023    3:50 PM 03/08/2023    7:05 AM 04/07/2021   10:39 AM 12/13/2020   10:40 AM 04/22/2020   10:56 AM 04/01/2020    1:07 PM  Advanced Directives  Does Patient Have a Medical Advance Directive? No No No No No No No  Would patient like information on creating a medical advance directive? Yes (ED - Information included in AVS) Yes (MAU/Ambulatory/Procedural Areas - Information given) No - Patient  declined  Yes (MAU/Ambulatory/Procedural Areas - Information given) No - Patient declined     Current Medications (verified) Outpatient Encounter Medications as of 05/30/2024  Medication Sig   ALPRAZolam (XANAX) 0.5 MG tablet Take 0.5 mg by mouth daily as needed.   benazepril  (LOTENSIN ) 10 MG tablet Take 10 mg by mouth daily.   busPIRone  (BUSPAR ) 15 MG tablet Take 15 mg by mouth 3 (three) times daily.   Calcium  Citrate-Vitamin D  250-5 MG-MCG TABS Take 1 tablet by mouth 2 (two) times daily.   fenofibrate  (TRICOR ) 145 MG tablet Take 1 tablet (145 mg total) by mouth daily.   lidocaine  (LIDODERM ) 5 % Place 1 patch onto the skin daily. Remove & Discard patch within 12 hours or as directed by MD   methocarbamol (ROBAXIN) 500 MG tablet Take 500 mg by mouth at bedtime as needed.   metoprolol  succinate (TOPROL -XL) 50 MG 24 hr tablet Take 1 tablet (50 mg total) by mouth daily. (Patient taking differently: Take 50 mg by mouth daily. Taking 1/2 tab daily)   Multiple Vitamin (MULTIVITAMIN) tablet Take 1 tablet by mouth daily.   Omega-3 Fatty Acids (FISH OIL) 1000 MG CAPS Take 1,000 mg by mouth 2 (two) times daily.   oxybutynin  (DITROPAN -XL) 10 MG 24 hr tablet TAKE 2 TABLETS(20 MG) BY MOUTH AT BEDTIME (Patient taking differently: TAKE 2 TABLETS(20 MG) BY MOUTH AT BEDTIME Taking 1 tablet twice a day)   QUEtiapine  (SEROQUEL ) 300 MG tablet Take 1 tablet (300 mg total) by mouth at bedtime. Take total of 350 mg at night. Take along with 50  mg tab   QUEtiapine  (SEROQUEL ) 50 MG tablet Take 1 tablet (50 mg total) by mouth at bedtime. Take total of 350 mg at night. Take along with 300 mg at night   venlafaxine  XR (EFFEXOR -XR) 75 MG 24 hr capsule Take 3 capsules (225 mg total) by mouth daily.   benazepril  (LOTENSIN ) 5 MG tablet Take 1 tablet (5 mg total) by mouth daily. (Patient not taking: Reported on 05/30/2024)   buPROPion  (WELLBUTRIN  XL) 150 MG 24 hr tablet Take 1 tablet (150 mg total) by mouth daily. (Patient not  taking: Reported on 05/30/2024)   rosuvastatin  (CRESTOR ) 40 MG tablet Take 1 tablet (40 mg total) by mouth daily. (Patient not taking: Reported on 05/30/2024)   No facility-administered encounter medications on file as of 05/30/2024.    Allergies (verified) Morphine, Fluoxetine, Paroxetine hcl, Penicillin g benzathine, Penicillin v potassium, Clindamycin hcl, Lamotrigine, and Lithium    History: Past Medical History:  Diagnosis Date   Anxiety    Arthritis    Bipolar disorder (HCC)    CKD (chronic kidney disease) stage 3, GFR 30-59 ml/min (HCC)    Complication of anesthesia    Depression    GERD (gastroesophageal reflux disease)    occasionally has to use tums   Headache    History of diverticulosis    Hyperlipidemia    Hypertension    Macular degeneration    Overactive bladder    PONV (postoperative nausea and vomiting)    PTSD (post-traumatic stress disorder)    Past Surgical History:  Procedure Laterality Date   COLONOSCOPY WITH PROPOFOL  N/A 10/07/2017   Procedure: COLONOSCOPY WITH PROPOFOL ;  Surgeon: Jinny Carmine, MD;  Location: [facility];  Service: Endoscopy;  Laterality: N/A;   COLONOSCOPY WITH PROPOFOL  N/A 03/08/2023   Procedure: COLONOSCOPY WITH PROPOFOL ;  Surgeon: Jinny Carmine, MD;  Location: [facility];  Service: Endoscopy;  Laterality: N/A;   FOOT SURGERY Bilateral    KNEE SURGERY Left    POLYPECTOMY  10/07/2017   Procedure: POLYPECTOMY;  Surgeon: Jinny Carmine, MD;  Location: [facility];  Service: Endoscopy;;   POLYPECTOMY  03/08/2023   Procedure: POLYPECTOMY;  Surgeon: Jinny Carmine, MD;  Location: [facility];  Service: Endoscopy;;   TONSILLECTOMY AND ADENOIDECTOMY     Family History  Problem Relation Age of Onset   Alzheimer's disease Mother    Bipolar disorder Mother    Depression Mother    Heart attack Father    Diabetes Brother    Obesity Brother    Other Brother        Hydrologist Myelitis   Depression Daughter    Stroke  Maternal Grandmother    Depression Maternal Grandfather    Stroke Paternal Grandmother    Heart attack Paternal Grandfather    Breast cancer Neg Hx    Social History   Socioeconomic History   Marital status: Married    Spouse name: Joe   Number of children: 2   Years of education: Not on file   Highest education level: Bachelor's degree (e.g., BA, AB, BS)  Occupational History   Occupation: retired   Tobacco Use   Smoking status: Former    Current packs/day: 0.00    Average packs/day: 0.5 packs/day for 18.0 years (9.0 ttl pk-yrs)    Types: Cigarettes    Start date: 07/09/1967    Quit date: 07/08/1985    Years since quitting: 38.9   Smokeless tobacco: Never  Vaping Use   Vaping status: Never Used  Substance and  Sexual Activity   Alcohol use: Not Currently    Alcohol/week: 3.0 standard drinks of alcohol    Types: 3 Shots of liquor per week    Comment: occassional   Drug use: Yes    Frequency: 2.0 times per week    Comment: CBD GUMMIES   Sexual activity: Not Currently  Other Topics Concern   Not on file  Social History Narrative   Not on file   Social Drivers of Health   Financial Resource Strain: Low Risk  (05/30/2024)   Overall Financial Resource Strain (CARDIA)    Difficulty of Paying Living Expenses: Not hard at all  Food Insecurity: No Food Insecurity (05/30/2024)   Hunger Vital Sign    Worried About Running Out of Food in the Last Year: Never true    Ran Out of Food in the Last Year: Never true  Transportation Needs: No Transportation Needs (05/30/2024)   PRAPARE - Administrator, Civil Service (Medical): No    Lack of Transportation (Non-Medical): No  Physical Activity: Inactive (05/30/2024)   Exercise Vital Sign    Days of Exercise per Week: 0 days    Minutes of Exercise per Session: 0 min  Stress: Stress Concern Present (05/30/2024)   [employer] of Occupational Health - Occupational Stress Questionnaire    Feeling of Stress: Very much   Social Connections: Socially Integrated (05/30/2024)   Social Connection and Isolation Panel    Frequency of Communication with Friends and Family: More than three times a week    Frequency of Social Gatherings with Friends and Family: More than three times a week    Attends Religious Services: More than 4 times per year    Active Member of [employer] or Organizations: Yes    Attends Engineer, structural: More than 4 times per year    Marital Status: Married    Tobacco Counseling Counseling given: Not Answered    Clinical Intake:  Pre-visit preparation completed: Yes  Pain : 0-10 Pain Score: 4  Pain Type: Chronic pain Pain Location: Back Pain Descriptors / Indicators: Aching     BMI - recorded: 30.79 Nutritional Status: BMI > 30  Obese Nutritional Risks: Nausea/ vomitting/ diarrhea (off & on x 1 year) Diabetes: No  Lab Results  Component Value Date   HGBA1C 5.7 (H) 09/29/2023   HGBA1C 5.3 03/29/2023   HGBA1C 5.7 (H) 09/08/2022     How often do you need to have someone help you when you read instructions, pamphlets, or other written materials from your doctor or pharmacy?: 1 - Never  Interpreter Needed?: No  Information entered by :: Vina Ned, CMA   Activities of Daily Living     05/30/2024   10:13 AM  In your present state of health, do you have any difficulty performing the following activities:  Hearing? 0  Vision? 0  Difficulty concentrating or making decisions? 1  Walking or climbing stairs? 0  Dressing or bathing? 0  Doing errands, shopping? 0  Preparing Food and eating ? N  Using the Toilet? N  In the past six months, have you accidently leaked urine? Y  Comment takes medication  Do you have problems with loss of bowel control? N  Managing your Medications? N  Managing your Finances? N  Housekeeping or managing your Housekeeping? N    Patient Care Team: Cannady, Jolene T, NP as PCP - General (Nurse Practitioner) Ermalinda Lenn HERO, LCSW  as Social Worker Mevelyn Charleston, [place] as Referring  Physician (Optometry) Evern Allyson Hacker, FNP (Neurology) Dennise Capri, MD as Consulting Physician (Nephrology) Gretta Fairy HERO, MD as Referring Physician (Otolaryngology) [facility] (Behavioral Health) Maree Jannett POUR, MD as Consulting Physician (Neurology)  I have updated your Care Teams any recent Medical Services you may have received from other providers in the past year.     Assessment:   This is a routine wellness examination for Saddie.  Hearing/Vision screen Hearing Screening - Comments:: Denies hearing loss  Vision Screening - Comments:: Gets routine eye exams, Dr. Lamar Antigua, [place]   Goals Addressed               This Visit's Progress     Increase physical activity (pt-stated)         Depression Screen     05/30/2024   10:25 AM 09/29/2023    9:02 AM 07/16/2023    3:49 PM 03/29/2023    9:42 AM 09/08/2022    9:32 AM 07/07/2022    8:44 AM 04/06/2022    4:33 PM  PHQ 2/9 Scores  PHQ - 2 Score 4 2 1 3 6 1 1   PHQ- 9 Score 13 14  13 26 14 2     Fall Risk     05/30/2024   10:32 AM 09/29/2023    9:00 AM 07/16/2023    3:49 PM 03/29/2023    9:43 AM 03/26/2023   11:59 AM  Fall Risk   Falls in the past year? 1 0 0 1 1  Number falls in past yr: 1 0  1 1  Injury with Fall? 1 0  0 1  Risk for fall due to : No Fall Risks;History of fall(s);Impaired balance/gait;Orthopedic patient No Fall Risks  Impaired mobility History of fall(s);Impaired balance/gait;Other (Comment)  Risk for fall due to: Comment     had a fall last week, legs were weak  Follow up Falls evaluation completed;Education provided Falls evaluation completed  Falls prevention discussed Falls evaluation completed;Education provided;Falls prevention discussed;Follow up appointment  Comment     03-29-2023 at 0920 am with pcp    MEDICARE RISK AT HOME:  Medicare Risk at Home Any stairs in or around the home?: Yes If so, are  there any without handrails?: Yes Home free of loose throw rugs in walkways, pet beds, electrical cords, etc?: No Adequate lighting in your home to reduce risk of falls?: Yes Life alert?: No Use of a cane, walker or w/c?: No Grab bars in the bathroom?: Yes Shower chair or bench in shower?: Yes Elevated toilet seat or a handicapped toilet?: No  TIMED UP AND GO:  Was the test performed?  No  Cognitive Function: 6CIT completed        05/30/2024   10:34 AM 03/29/2023    9:58 AM 04/07/2021   10:44 AM 04/01/2020    1:12 PM 02/04/2018    8:39 AM  6CIT Screen  What Year? 0 points 0 points 0 points 0 points 0 points  What month? 0 points 0 points 0 points 0 points 0 points  What time? 0 points 0 points 0 points 0 points 0 points  Count back from 20 2 points 0 points 0 points 0 points 0 points  Months in reverse 2 points 0 points 0 points 0 points 0 points  Repeat phrase 2 points 2 points 2 points 2 points 0 points  Total Score 6 points 2 points 2 points 2 points 0 points    Immunizations Immunization History  Administered Date(s) Administered   Fluad Quad(high Dose 65+) 06/15/2019, 06/11/2021, 06/22/2022   INFLUENZA, HIGH DOSE SEASONAL PF 06/06/2018   Influenza-Unspecified 06/05/2015, 06/04/2016, 06/28/2017, 06/11/2020, 07/01/2023   Moderna Sars-Covid-2 Vaccination 06/22/2022   PFIZER(Purple Top)SARS-COV-2 Vaccination 10/24/2019, 11/14/2019, 07/04/2020   Pneumococcal Conjugate-13 10/20/2016   Pneumococcal Polysaccharide-23 03/28/2018   Td 03/29/2023   Tdap 03/23/2013   Zoster Recombinant(Shingrix) 02/19/2017, 05/10/2017   Zoster, Live 01/13/2011    Screening Tests Health Maintenance  Topic Date Due   Influenza Vaccine  04/14/2024   COVID-19 Vaccine (5 - 2025-26 season) 05/15/2024   Mammogram  02/09/2025   Medicare Annual Wellness (AWV)  05/30/2025   DEXA SCAN  02/09/2029   Colonoscopy  03/07/2030   DTaP/Tdap/Td (3 - Td or Tdap) 03/28/2033   Pneumococcal Vaccine: 50+ Years   Completed   Hepatitis C Screening  Completed   Zoster Vaccines- Shingrix  Completed   HPV VACCINES  Aged Out   Meningococcal B Vaccine  Aged Out    Health Maintenance Items Addressed: See Nurse Notes at the end of this note  Additional Screening:  Vision Screening: Recommended annual ophthalmology exams for early detection of glaucoma and other disorders of the eye. Is the patient up to date with their annual eye exam?  Yes  Who is the provider or what is the name of the office in which the patient attends annual eye exams? Dr. Lamar Antigua @ [facility] [place], [place]  Dental Screening: Recommended annual dental exams for proper oral hygiene  Community Resource Referral / Chronic Care Management: CRR required this visit?  No   CCM required this visit?  No   Plan:    I have personally reviewed and noted the following in the patient's chart:   Medical and social history Use of alcohol, tobacco or illicit drugs  Current medications and supplements including opioid prescriptions. Patient is not currently taking opioid prescriptions. Functional ability and status Nutritional status Physical activity Advanced directives List of other physicians Hospitalizations, surgeries, and ER visits in previous 12 months Vitals Screenings to include cognitive, depression, and falls Referrals and appointments  In addition, I have reviewed and discussed with patient certain preventive protocols, quality metrics, and best practice recommendations. A written personalized care plan for preventive services as well as general preventive health recommendations were provided to patient.   Vina Ned, CMA   05/30/2024   After Visit Summary: (MyChart) Due to this being a telephonic visit, the after visit summary with patients personalized plan was offered to patient via MyChart   Notes:  6 CIT Score - 6 Will get flu at her church Declined Covid vaccine

## 2024-06-12 ENCOUNTER — Ambulatory Visit: Payer: Self-pay

## 2024-06-12 NOTE — Telephone Encounter (Signed)
 Noted

## 2024-06-12 NOTE — Telephone Encounter (Signed)
 FYI Only or Action Required?: FYI only for provider.  Patient was last seen in primary care on 09/29/2023 by Valerio Melanie DASEN, NP.  Called Nurse Triage reporting Vomiting.  Symptoms began several months ago.  Interventions attempted: OTC medications: Tums, Rolaids.  Symptoms are: gradually worsening.  Triage Disposition: See PCP Within 2 Weeks  Patient/caregiver understands and will follow disposition?: Yes  Message from [place] sent at 06/12/2024 12:27 PM EDT  Patient called in stating she is vomiting every time she eats food. She is coming in for labs October 1st. Patient wants know if any other labs need to be added due to her vomiting.    Reason for Disposition  Vomiting is a chronic symptom (recurrent or ongoing AND present > 4 weeks)  Answer Assessment - Initial Assessment Questions 1. VOMITING SEVERITY: How many times have you vomited in the past 24 hours?      Every time I eat 2. ONSET: When did the vomiting begin?      About a year, worse as time goes on  Pt states that it feels like the food won't pass. Pt states that she has been having indigestion. Pt states that she has not attempted to use OTC medications as prescribed, pt states that she has taken Prilosec occasionally but states only one dose and not for the 14 days as the label states. Attempted to schedule, pt declined. Pt states that she would like to try prilosec as directed on the bottle. Pt advised to call nephrologist to verify that she can take the medication for 14 days. Pt is scheduled for 06/21/24 for physical.  Protocols used: Vomiting-A-AH

## 2024-06-14 ENCOUNTER — Other Ambulatory Visit

## 2024-06-14 DIAGNOSIS — F3175 Bipolar disorder, in partial remission, most recent episode depressed: Secondary | ICD-10-CM

## 2024-06-14 DIAGNOSIS — G5793 Unspecified mononeuropathy of bilateral lower limbs: Secondary | ICD-10-CM

## 2024-06-14 DIAGNOSIS — I10 Essential (primary) hypertension: Secondary | ICD-10-CM

## 2024-06-14 DIAGNOSIS — E782 Mixed hyperlipidemia: Secondary | ICD-10-CM

## 2024-06-14 DIAGNOSIS — E538 Deficiency of other specified B group vitamins: Secondary | ICD-10-CM

## 2024-06-14 DIAGNOSIS — M85852 Other specified disorders of bone density and structure, left thigh: Secondary | ICD-10-CM

## 2024-06-15 ENCOUNTER — Ambulatory Visit: Payer: Self-pay | Admitting: Nurse Practitioner

## 2024-06-15 LAB — COMPREHENSIVE METABOLIC PANEL WITH GFR
ALT: 27 IU/L (ref 0–32)
AST: 26 IU/L (ref 0–40)
Albumin: 4.6 g/dL (ref 3.8–4.8)
Alkaline Phosphatase: 76 IU/L (ref 49–135)
BUN/Creatinine Ratio: 18 (ref 12–28)
BUN: 22 mg/dL (ref 8–27)
Bilirubin Total: 0.5 mg/dL (ref 0.0–1.2)
CO2: 22 mmol/L (ref 20–29)
Calcium: 10.4 mg/dL — ABNORMAL HIGH (ref 8.7–10.3)
Chloride: 102 mmol/L (ref 96–106)
Creatinine, Ser: 1.25 mg/dL — ABNORMAL HIGH (ref 0.57–1.00)
Globulin, Total: 2.3 g/dL (ref 1.5–4.5)
Glucose: 100 mg/dL — ABNORMAL HIGH (ref 70–99)
Potassium: 4.6 mmol/L (ref 3.5–5.2)
Sodium: 137 mmol/L (ref 134–144)
Total Protein: 6.9 g/dL (ref 6.0–8.5)
eGFR: 46 mL/min/1.73 — ABNORMAL LOW (ref >59)

## 2024-06-15 LAB — CBC WITH DIFFERENTIAL/PLATELET
Basophils Absolute: 0.1 x10E3/uL (ref 0.0–0.2)
Basos: 1 %
EOS (ABSOLUTE): 0.1 x10E3/uL (ref 0.0–0.4)
Eos: 2 %
Hematocrit: 38.8 % (ref 34.0–46.6)
Hemoglobin: 12.5 g/dL (ref 11.1–15.9)
Immature Grans (Abs): 0 x10E3/uL (ref 0.0–0.1)
Immature Granulocytes: 0 %
Lymphocytes Absolute: 1.6 x10E3/uL (ref 0.7–3.1)
Lymphs: 32 %
MCH: 29.3 pg (ref 26.6–33.0)
MCHC: 32.2 g/dL (ref 31.5–35.7)
MCV: 91 fL (ref 79–97)
Monocytes Absolute: 0.4 x10E3/uL (ref 0.1–0.9)
Monocytes: 8 %
Neutrophils Absolute: 3 x10E3/uL (ref 1.4–7.0)
Neutrophils: 57 %
Platelets: 272 x10E3/uL (ref 150–450)
RBC: 4.27 x10E6/uL (ref 3.77–5.28)
RDW: 13.3 % (ref 11.7–15.4)
WBC: 5.2 x10E3/uL (ref 3.4–10.8)

## 2024-06-15 LAB — LIPID PANEL W/O CHOL/HDL RATIO
Cholesterol, Total: 205 mg/dL — ABNORMAL HIGH (ref 100–199)
HDL: 64 mg/dL (ref >39)
LDL Chol Calc (NIH): 116 mg/dL — ABNORMAL HIGH (ref 0–99)
Triglycerides: 145 mg/dL (ref 0–149)
VLDL Cholesterol Cal: 25 mg/dL (ref 5–40)

## 2024-06-15 LAB — VITAMIN B12: Vitamin B-12: 1852 pg/mL — ABNORMAL HIGH (ref 232–1245)

## 2024-06-15 LAB — TSH: TSH: 0.657 u[IU]/mL (ref 0.450–4.500)

## 2024-06-15 LAB — VITAMIN D 25 HYDROXY (VIT D DEFICIENCY, FRACTURES): Vit D, 25-Hydroxy: 43.1 ng/mL (ref 30.0–100.0)

## 2024-06-15 NOTE — Progress Notes (Signed)
 Contacted via MyChart The 10-year ASCVD risk score (Arnett DK, et al., 2019) is: 12.4%   Values used to calculate the score:     Age: 73 years     Clincally relevant sex: Female     Is Non-Hispanic African American: No     Diabetic: No     Tobacco smoker: No     Systolic Blood Pressure: 114 mmHg     Is BP treated: Yes     HDL Cholesterol: 64 mg/dL     Total Cholesterol: 205 mg/dL  Good afternoon Anelle, your labs have returned: - B12 level a little too high, reduce supplement to three days a week. - Lipid panel is showing significant trends up, please restart Rosuvastatin  daily as levels were improved when you were taking this. - Kidney function continues to show Stage 3a kidney disease with no worsening. Liver function, AST and ALT, normal.  Calcium  mildly elevated, very mild so we can continue to monitor. - Remainder of labs normal.  Any questions? Keep being amazing!!  Thank you for allowing me to participate in your care.  I appreciate you. Kindest regards, Coyt Govoni

## 2024-06-17 DIAGNOSIS — F0280 Dementia in other diseases classified elsewhere without behavioral disturbance: Secondary | ICD-10-CM | POA: Insufficient documentation

## 2024-06-17 NOTE — Patient Instructions (Signed)

## 2024-06-21 ENCOUNTER — Telehealth: Payer: Self-pay

## 2024-06-21 ENCOUNTER — Ambulatory Visit: Admitting: Nurse Practitioner

## 2024-06-21 ENCOUNTER — Encounter: Payer: Self-pay | Admitting: Nurse Practitioner

## 2024-06-21 VITALS — BP 128/77 | HR 78 | Temp 98.1°F | Resp 16 | Ht 65.0 in | Wt 187.0 lb

## 2024-06-21 DIAGNOSIS — E782 Mixed hyperlipidemia: Secondary | ICD-10-CM

## 2024-06-21 DIAGNOSIS — Z0000 Encounter for general adult medical examination without abnormal findings: Secondary | ICD-10-CM

## 2024-06-21 DIAGNOSIS — M85852 Other specified disorders of bone density and structure, left thigh: Secondary | ICD-10-CM

## 2024-06-21 DIAGNOSIS — E538 Deficiency of other specified B group vitamins: Secondary | ICD-10-CM

## 2024-06-21 DIAGNOSIS — G309 Alzheimer's disease, unspecified: Secondary | ICD-10-CM

## 2024-06-21 DIAGNOSIS — F0280 Dementia in other diseases classified elsewhere without behavioral disturbance: Secondary | ICD-10-CM | POA: Diagnosis not present

## 2024-06-21 DIAGNOSIS — F3175 Bipolar disorder, in partial remission, most recent episode depressed: Secondary | ICD-10-CM

## 2024-06-21 DIAGNOSIS — I10 Essential (primary) hypertension: Secondary | ICD-10-CM

## 2024-06-21 DIAGNOSIS — N3281 Overactive bladder: Secondary | ICD-10-CM

## 2024-06-21 DIAGNOSIS — G5793 Unspecified mononeuropathy of bilateral lower limbs: Secondary | ICD-10-CM

## 2024-06-21 DIAGNOSIS — Z23 Encounter for immunization: Secondary | ICD-10-CM | POA: Diagnosis not present

## 2024-06-21 DIAGNOSIS — F419 Anxiety disorder, unspecified: Secondary | ICD-10-CM

## 2024-06-21 DIAGNOSIS — K219 Gastro-esophageal reflux disease without esophagitis: Secondary | ICD-10-CM

## 2024-06-21 MED ORDER — BENAZEPRIL HCL 10 MG PO TABS: 10.0000 mg | ORAL_TABLET | Freq: Every day | ORAL | 3 refills | Status: DC

## 2024-06-21 MED ORDER — CETIRIZINE HCL 10 MG PO TABS: 10.0000 mg | ORAL_TABLET | Freq: Every day | ORAL | 11 refills | Status: AC

## 2024-06-21 MED ORDER — LIDOCAINE 5 % EX PTCH
1.0000 | MEDICATED_PATCH | CUTANEOUS | 1 refills | Status: AC
Start: 2024-06-21 — End: ?

## 2024-06-21 MED ORDER — METOPROLOL SUCCINATE ER 50 MG PO TB24
25.0000 mg | ORAL_TABLET | Freq: Every day | ORAL | 4 refills | Status: DC
Start: 2024-06-21 — End: 2024-06-23

## 2024-06-21 MED ORDER — ROSUVASTATIN CALCIUM 40 MG PO TABS: 40.0000 mg | ORAL_TABLET | Freq: Every day | ORAL | 4 refills | Status: AC

## 2024-06-21 MED ORDER — METOPROLOL SUCCINATE ER 50 MG PO TB24: 50.0000 mg | ORAL_TABLET | Freq: Every day | ORAL | 4 refills | Status: DC

## 2024-06-21 NOTE — Assessment & Plan Note (Signed)
 Chronic, ongoing.  Well-controlled with Ditropan , however discussed with her would benefit from change to Myrbetriq  in future.  Currently this is not covered fully and would be expensive OOP.  Educated patient on medication and risks of this with patients over 46.  She would like to continue regimen at this time as is only one that has offered benefit.  Recommend she try to reduce to once daily dosing.

## 2024-06-21 NOTE — Assessment & Plan Note (Addendum)
 Chronic, ongoing.  Continue current medication regimen at this time, but recommend only use PRN.  Risks of PPI use were discussed with patient including bone loss, C. Diff diarrhea, pneumonia, infections, CKD, electrolyte abnormalities. Verbalizes understanding and chooses to continue the medication.

## 2024-06-21 NOTE — Assessment & Plan Note (Signed)
Refer to Bipolar plan of care. 

## 2024-06-21 NOTE — Assessment & Plan Note (Signed)
 Follows with neurology, continue this collaboration.  Recent notes reviewed.  Monitor memory closely.

## 2024-06-21 NOTE — Assessment & Plan Note (Signed)
 Chronic, stable with BP well below goal today in office. Recommend she monitor BP at least a few mornings a week at home and document.  DASH diet at home.  Continue current medication regimen and adjust as needed.  Labs today: up to date. Urine ALB ratio 4, maintain ACE on board.

## 2024-06-21 NOTE — Assessment & Plan Note (Addendum)
 Chronic, ongoing.  Followed by psychiatry.  Continue current medication regimen as prescribed by them. Denies SI/HI at this time.  Continue collaboration with psychiatry and therapist.  She is aware if intrusive thoughts present to immediately go to ER, has a safety plan in place.

## 2024-06-21 NOTE — Assessment & Plan Note (Signed)
 Chronic, ongoing.  Recommend continue daily Calcium  and Vitamin D  supplement.  Weight bearing exercises at home.  Check Vit D annually.  Repeat DEXA around May 2030.
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
## 2024-06-21 NOTE — Assessment & Plan Note (Signed)
 Chronic, taking supplement daily.  Recent level improved and recommended she cut back to every other day on supplement.

## 2024-06-21 NOTE — Telephone Encounter (Unsigned)
 Copied from CRM #[id number]. Topic: Clinical - Prescription Issue >> Jun 21, 2024  3:42 PM Donna BRAVO wrote: Reason for CRM:  Kaitlyn (Pharmacy) [phone] [facility]  Needing clarification on directions for medication metoprolol  succinate (TOPROL -XL) 50 MG 24 hr tablet

## 2024-06-21 NOTE — Assessment & Plan Note (Signed)
 Chronic, ongoing.  Continue current medication regimen and adjust as needed.  Labs up to date and elevations noted. Recommend she take medication daily as had missed doses recently.

## 2024-06-23 ENCOUNTER — Other Ambulatory Visit: Payer: Self-pay | Admitting: Nurse Practitioner

## 2024-06-23 MED ORDER — METOPROLOL SUCCINATE ER 25 MG PO TB24: 12.5000 mg | ORAL_TABLET | Freq: Every day | ORAL | 3 refills | Status: AC

## 2024-06-23 NOTE — Telephone Encounter (Signed)
 I am guessing it needs to be wrote for 50 mg and the portion that says not to be filled as  mg needs to be removed and the directions to state patient is to only take half.

## 2024-06-23 NOTE — Telephone Encounter (Signed)
 Confirmed pharmacy has received and is working on the 25 mg. They should be splitting the pill for the patient.

## 2024-07-04 ENCOUNTER — Encounter: Admitting: Nurse Practitioner

## 2024-07-05 ENCOUNTER — Ambulatory Visit: Payer: Self-pay | Admitting: Nurse Practitioner

## 2024-07-05 ENCOUNTER — Ambulatory Visit: Admitting: Family Medicine

## 2024-07-05 ENCOUNTER — Encounter: Payer: Self-pay | Admitting: Family Medicine

## 2024-07-05 VITALS — BP 122/80 | HR 77 | Ht 65.0 in | Wt 191.4 lb

## 2024-07-05 DIAGNOSIS — N3281 Overactive bladder: Secondary | ICD-10-CM | POA: Diagnosis not present

## 2024-07-05 DIAGNOSIS — H8113 Benign paroxysmal vertigo, bilateral: Secondary | ICD-10-CM

## 2024-07-05 MED ORDER — MECLIZINE HCL 25 MG PO TABS: 25.0000 mg | ORAL_TABLET | Freq: Three times a day (TID) | ORAL | 0 refills | Status: AC | PRN

## 2024-07-05 NOTE — Telephone Encounter (Signed)
 FYI Only or Action Required?: FYI only for provider.  Patient was last seen in primary care on 06/21/2024 by Valerio Melanie DASEN, NP.  Called Nurse Triage reporting Dizziness.  Symptoms began several weeks ago.  Triage Disposition: See Physician Within 24 Hours  Patient/caregiver understands and will follow disposition?: Yes             Copied from CRM #[id number]. Topic: Clinical - Red Word Triage >> Jul 05, 2024 12:00 PM Ivette P wrote: [facility] that prompted transfer to Nurse Triage: vertigo - went to detist. laid back and worked on her. not going away. Reason for Disposition  [1] MODERATE dizziness (e.g., vertigo; feels very unsteady, interferes with normal activities) AND [2] has NOT been evaluated by doctor (or NP/PA) for this  Answer Assessment - Initial Assessment Questions Pt scheduled for an appointment today in office. Pt aware of location. This RN recommends another adult drives pt if she is having symptoms.   Vertigo x1 week Pt thought it would go away but it hasn't Saw dentist on 10/14 and when pt was sat up from lying down she got dizzy Intermittent when standing up or moving head sideways  Pt states it has happened before (years ago); last time it happened pt made an appt but it went away before the appt  Pt states she is nervous about driving  VERTIGO: Do you feel like either you or the room is spinning or tilting?      Both  LIGHTHEADED: Do you feel lightheaded? (e.g., somewhat faint, woozy, weak upon standing)     Yes pretty much constant  SEVERITY: How bad is it?  Can you walk?     Yes can walk but sometimes balance is off; denies falling  OTHER SYMPTOMS: Do you have any other symptoms? (e.g., earache, headache, numbness, tinnitus, vomiting, weakness)     Consistent headache prior to this happening (has gone away now); denies earache, numbness, tinnitus  Protocols used: Dizziness - Vertigo-A-AH

## 2024-07-05 NOTE — Patient Instructions (Addendum)
 Thank you for coming to the office today.  Reduce Oxybutynin  to ONCE A DAY - only at bedtime if possible, SKIP the morning.  1. You have symptoms of Vertigo (Benign Paroxysmal Positional Vertigo) - This is commonly caused by inner ear fluid imbalance, sometimes can be worsened by allergies and sinus symptoms, otherwise it can occur randomly sometimes and we may never discover the exact cause. - To treat this, try the Epley Manuever (see diagrams/instructions below) at home up to 3 times a day for 1-2 weeks or until symptoms resolve - You may take Meclizine as needed up to 3 times a day for dizziness, this will not cure symptoms but may help. Caution may make you drowsy.  If you develop significant worsening episode with vertigo that does not improve and you get severe headache, loss of vision, arm or leg weakness, slurred speech, or other concerning symptoms please seek immediate medical attention at Emergency Department.  Please schedule a follow-up appointment with Jolene Cannady NP within 4 weeks if Vertigo not improving, and will consider Referral to Vestibular Rehab  See the next page for images describing the Epley Manuever.     ----------------------------------------------------------------------------------------------------------------------         Please schedule a Follow-up Appointment to: Return if symptoms worsen or fail to improve.  If you have any other questions or concerns, please feel free to call the office or send a message through MyChart. You may also schedule an earlier appointment if necessary.  Additionally, you may be receiving a survey about your experience at our office within a few days to 1 week by e-mail or mail. We value your feedback.  Marsa Officer, DO [facility], [place]

## 2024-07-05 NOTE — Progress Notes (Signed)
 Subjective:    Patient ID: Elizabeth Malone, female    DOB: 1951/04/09, 73 y.o.   MRN: [id number]  Elizabeth Malone is a 73 y.o. female presenting on 07/05/2024 for Dizziness (About 1 week)  Patient presents for an ACUTE or SAME DAY appointment.  PCP Melanie Polio NP at [facility]  Note - This patient's PCP works out of a different office within the [employer] [facility] region. They have been scheduled with our office for a one time ACUTE visit today due to limited availability for acute appointment with their PCP's office. Additional follow-up and management beyond the scope of the discussion and treatment for today's visit will be addressed by their PCP's office.   HPI  Discussed the use of AI scribe software for clinical note transcription with the patient, who gave verbal consent to proceed.  History of Present Illness   Elizabeth Malone is a 73 year old female who presents with persistent dizziness following a dental visit.  Dizziness and vertigo - Persistent dizziness began after a recent dental visit where she was laid back for an extended period and became symptomatic upon being set upright - Dizziness is described as a sensation of her head turning with her brain taking time to 'catch up' - Symptoms are triggered by head turning, sitting up, or standing, and are relieved by remaining still - Dizziness has been present daily since the dental visit - History of a single prior episode of vertigo many years ago, which resolved spontaneously - No associated chest pain, shortness of breath, or palpitations - No recent sinus or other infections - No other neurological symptoms such as loss of function, numbness, tingling, or difficulty with speech or movement - Experienced an unusual headache for approximately half a week - Concerned about ability to drive safely due to dizziness, especially with upcoming driver's license renewal   Medication use and side  effects - Current medications include oxybutynin  XL 10 mg (one tablet in the morning and one at night) and benazepril , recently increased from 5 mg to 10 mg - Concern about cost of alternative bladder medications previously suggested due to potential side effects such as dizziness and dry mouth  Recent physical activity - Recent period of increased physical activity involving bending and moving items, suspected as a possible contributing factor to symptoms         07/05/2024    2:45 PM 06/21/2024    1:13 PM 05/30/2024   10:25 AM  Depression screen PHQ 2/9  Decreased Interest 0 0 2  Down, Depressed, Hopeless 0 0 2  PHQ - 2 Score 0 0 4  Altered sleeping 0 0 1  Tired, decreased energy 0 1 3  Change in appetite 1 0 2  Feeling bad or failure about yourself  0 1 2  Trouble concentrating 1 0 1  Moving slowly or fidgety/restless 0 0 0  Suicidal thoughts 0 0 0  PHQ-9 Score 2 2 13   Difficult doing work/chores   Very difficult       07/05/2024    2:45 PM 06/21/2024    1:14 PM 09/29/2023    9:02 AM 03/29/2023    9:42 AM  GAD 7 : Generalized Anxiety Score  Nervous, Anxious, on Edge 0 0 3 0  Control/stop worrying 0 0 3 1  Worry too much - different things 0 0 3 3  Trouble relaxing 0 0 2 0  Restless 0 0 2 0  Easily  annoyed or irritable 0 0 1 0  Afraid - awful might happen 1 0 3 1  Total GAD 7 Score 1 0 17 5  Anxiety Difficulty Somewhat difficult  Very difficult Not difficult at all    Social History   Tobacco Use   Smoking status: Former    Current packs/day: 0.00    Average packs/day: 0.5 packs/day for 18.0 years (9.0 ttl pk-yrs)    Types: Cigarettes    Start date: 07/09/1967    Quit date: 07/08/1985    Years since quitting: 39.0   Smokeless tobacco: Never  Vaping Use   Vaping status: Never Used  Substance Use Topics   Alcohol use: Not Currently    Alcohol/week: 3.0 standard drinks of alcohol    Types: 3 Shots of liquor per week    Comment: occassional   Drug use: Yes     Frequency: 2.0 times per week    Comment: CBD GUMMIES    Review of Systems Per HPI unless specifically indicated above     Objective:    BP 122/80 (BP Location: Left Arm, Patient Position: Sitting, Cuff Size: Normal)   Pulse 77   Ht 5' 5 (1.651 m)   Wt 191 lb 6 oz (86.8 kg)   SpO2 98%   BMI 31.85 kg/m   Wt Readings from Last 3 Encounters:  07/05/24 191 lb 6 oz (86.8 kg)  06/21/24 187 lb (84.8 kg)  05/30/24 185 lb (83.9 kg)    Physical Exam Vitals and nursing note reviewed.  Constitutional:      General: She is not in acute distress.    Appearance: Normal appearance. She is well-developed. She is not diaphoretic.     Comments: Well-appearing, comfortable, cooperative  HENT:     Head: Normocephalic and atraumatic.     Comments: Some provoked mild vertigo symptoms with quick head movements today during exam.    Right Ear: Tympanic membrane, ear canal and external ear normal. There is no impacted cerumen.     Left Ear: Tympanic membrane, ear canal and external ear normal. There is no impacted cerumen.  Eyes:     General:        Right eye: No discharge.        Left eye: No discharge.     Conjunctiva/sclera: Conjunctivae normal.  Cardiovascular:     Rate and Rhythm: Normal rate.  Pulmonary:     Effort: Pulmonary effort is normal.  Skin:    General: Skin is warm and dry.     Findings: No erythema or rash.  Neurological:     Mental Status: She is alert and oriented to person, place, and time.  Psychiatric:        Mood and Affect: Mood normal.        Behavior: Behavior normal.        Thought Content: Thought content normal.     Comments: Well groomed, good eye contact, normal speech and thoughts     Results for orders placed or performed in visit on 06/14/24  VITAMIN D  25 Hydroxy (Vit-D Deficiency, Fractures)   Collection Time: 06/14/24  9:29 AM  Result Value Ref Range   Vit D, 25-Hydroxy 43.1 30.0 - 100.0 ng/mL  Vitamin B12   Collection Time: 06/14/24  9:29 AM   Result Value Ref Range   Vitamin B-12 1,852 (H) 232 - 1,245 pg/mL  TSH   Collection Time: 06/14/24  9:29 AM  Result Value Ref Range   TSH 0.657 0.450 -  4.500 uIU/mL  Lipid Panel w/o Chol/HDL Ratio   Collection Time: 06/14/24  9:29 AM  Result Value Ref Range   Cholesterol, Total 205 (H) 100 - 199 mg/dL   Triglycerides 854 0 - 149 mg/dL   HDL 64 >60 mg/dL   VLDL Cholesterol Cal 25 5 - 40 mg/dL   LDL Chol Calc (NIH) 883 (H) 0 - 99 mg/dL  Comprehensive metabolic panel with GFR   Collection Time: 06/14/24  9:29 AM  Result Value Ref Range   Glucose 100 (H) 70 - 99 mg/dL   BUN 22 8 - 27 mg/dL   Creatinine, Ser 8.74 (H) 0.57 - 1.00 mg/dL   eGFR 46 (L) >40 fO/fpw/8.26   BUN/Creatinine Ratio 18 12 - 28   Sodium 137 134 - 144 mmol/L   Potassium 4.6 3.5 - 5.2 mmol/L   Chloride 102 96 - 106 mmol/L   CO2 22 20 - 29 mmol/L   Calcium  10.4 (H) 8.7 - 10.3 mg/dL   Total Protein 6.9 6.0 - 8.5 g/dL   Albumin 4.6 3.8 - 4.8 g/dL   Globulin, Total 2.3 1.5 - 4.5 g/dL   Bilirubin Total 0.5 0.0 - 1.2 mg/dL   Alkaline Phosphatase 76 49 - 135 IU/L   AST 26 0 - 40 IU/L   ALT 27 0 - 32 IU/L  CBC with Differential/Platelet   Collection Time: 06/14/24  9:29 AM  Result Value Ref Range   WBC 5.2 3.4 - 10.8 x10E3/uL   RBC 4.27 3.77 - 5.28 x10E6/uL   Hemoglobin 12.5 11.1 - 15.9 g/dL   Hematocrit 61.1 65.9 - 46.6 %   MCV 91 79 - 97 fL   MCH 29.3 26.6 - 33.0 pg   MCHC 32.2 31.5 - 35.7 g/dL   RDW 86.6 88.2 - 84.5 %   Platelets 272 150 - 450 x10E3/uL   Neutrophils 57 Not Estab. %   Lymphs 32 Not Estab. %   Monocytes 8 Not Estab. %   Eos 2 Not Estab. %   Basos 1 Not Estab. %   Neutrophils Absolute 3.0 1.4 - 7.0 x10E3/uL   Lymphocytes Absolute 1.6 0.7 - 3.1 x10E3/uL   Monocytes Absolute 0.4 0.1 - 0.9 x10E3/uL   EOS (ABSOLUTE) 0.1 0.0 - 0.4 x10E3/uL   Basophils Absolute 0.1 0.0 - 0.2 x10E3/uL   Immature Granulocytes 0 Not Estab. %   Immature Grans (Abs) 0.0 0.0 - 0.1 x10E3/uL      Assessment &  Plan:   Problem List Items Addressed This Visit     Overactive bladder   Relevant Medications   oxybutynin  (DITROPAN -XL) 10 MG 24 hr tablet   Other Visit Diagnoses       BPPV (benign paroxysmal positional vertigo), bilateral    -  Primary   Relevant Medications   meclizine (ANTIVERT) 25 MG tablet        Benign paroxysmal positional vertigo (BPPV) Dizziness consistent with BPPV, possibly exacerbated by oxybutynin  or other medication side effect No focal neurological deficits identified. No new medications to obviously provoke. Onset following laying back in dentist chair Suspected vestibular problem / inner ear source. No infection or acute etiology identified.  - Instructed on Epley maneuver exercises, to be done at home three times daily until symptoms resolve. AVS given with instructions on exercise - Prescribed meclizine as needed, up to three times daily. Caution with side effects. - Advised reducing oxybutynin  to one pill daily to minimize dizziness - she was on Oxybutynin  XL 10mg  TWICE A  DAY dosing previously. - Consider referral for vestibular therapy / balance treatment if symptoms persist  Overactive bladder on oxybutynin  therapy Oxybutynin  may contribute to dizziness. Alternatives are cost-prohibitive Myrbetriq . However she did try other versions in past. - Reduce oxybutynin  XL 10mg  instead of TWICE A DAY to once nightly in PM - Consider alternative bladder management if dizziness persists.   No orders of the defined types were placed in this encounter.   Meds ordered this encounter  Medications   meclizine (ANTIVERT) 25 MG tablet    Sig: Take 1 tablet (25 mg total) by mouth 3 (three) times daily as needed for dizziness.    Dispense:  30 tablet    Refill:  0    Follow up plan: Return if symptoms worsen or fail to improve.  Marsa Officer, DO [facility] 07/05/2024, 2:51 PM

## 2024-08-14 ENCOUNTER — Telehealth: Payer: Self-pay | Admitting: Nurse Practitioner

## 2024-08-14 NOTE — Telephone Encounter (Unsigned)
 Copied from CRM #[id number]. Topic: Clinical - Medication Refill >> Aug 14, 2024  4:40 PM Teressa P wrote: Medication: Benazepril  10 mg  Has the patient contacted their pharmacy? Yes (Agent: If no, request that the patient contact the pharmacy for the refill. If patient does not wish to contact the pharmacy document the reason why and proceed with request.) (Agent: If yes, when and what did the pharmacy advise?)  This is the patient's preferred pharmacy:  [facility] - [place], [place] - [address] [place] [address] Phone: [phone] Fax: [phone]  Is this the correct pharmacy for this prescription? Yes If no, delete pharmacy and type the correct one.   Has the prescription been filled recently? Yes  Is the patient out of the medication? No  Has the patient been seen for an appointment in the last year OR does the patient have an upcoming appointment? Yes  Can we respond through MyChart? No  Agent: Please be advised that Rx refills may take up to 3 business days. We ask that you follow-up with your pharmacy.

## 2024-08-17 MED ORDER — BENAZEPRIL HCL 10 MG PO TABS: 10.0000 mg | ORAL_TABLET | Freq: Every day | ORAL | 1 refills | Status: AC

## 2024-08-17 NOTE — Telephone Encounter (Signed)
 Requested Prescriptions  Pending Prescriptions Disp Refills   benazepril  (LOTENSIN ) 10 MG tablet 90 tablet 3    Sig: Take 1 tablet (10 mg total) by mouth daily.     Cardiovascular:  ACE Inhibitors Failed - 08/17/2024  3:12 PM      Failed - Cr in normal range and within 180 days    Creatinine, Ser  Date Value Ref Range Status  06/14/2024 1.25 (H) 0.57 - 1.00 mg/dL Final         Passed - K in normal range and within 180 days    Potassium  Date Value Ref Range Status  06/14/2024 4.6 3.5 - 5.2 mmol/L Final         Passed - Patient is not pregnant      Passed - Last BP in normal range    BP Readings from Last 1 Encounters:  07/05/24 122/80         Passed - Valid encounter within last 6 months    Recent Outpatient Visits           1 month ago BPPV (benign paroxysmal positional vertigo), bilateral   [place] [facility] [place], Marsa PARAS, DO   1 month ago Alzheimer disease [facility])   [place] [facility] [place], Melanie DASEN, NP

## 2024-08-18 ENCOUNTER — Emergency Department

## 2024-08-18 ENCOUNTER — Emergency Department
Admission: EM | Admit: 2024-08-18 | Discharge: 2024-08-18 | Disposition: A | Discharge status: Home / Self Care | Attending: Emergency Medicine | Admitting: Emergency Medicine

## 2024-08-18 ENCOUNTER — Other Ambulatory Visit: Payer: Self-pay

## 2024-08-18 DIAGNOSIS — Z79899 Other long term (current) drug therapy: Secondary | ICD-10-CM | POA: Insufficient documentation

## 2024-08-18 DIAGNOSIS — R1030 Lower abdominal pain, unspecified: Secondary | ICD-10-CM | POA: Insufficient documentation

## 2024-08-18 DIAGNOSIS — R112 Nausea with vomiting, unspecified: Secondary | ICD-10-CM | POA: Insufficient documentation

## 2024-08-18 DIAGNOSIS — N183 Chronic kidney disease, stage 3 unspecified: Secondary | ICD-10-CM | POA: Insufficient documentation

## 2024-08-18 DIAGNOSIS — I129 Hypertensive chronic kidney disease with stage 1 through stage 4 chronic kidney disease, or unspecified chronic kidney disease: Secondary | ICD-10-CM | POA: Insufficient documentation

## 2024-08-18 LAB — URINALYSIS, ROUTINE W REFLEX MICROSCOPIC
Bacteria, UA: NONE SEEN
Bilirubin Urine: NEGATIVE
Glucose, UA: NEGATIVE mg/dL
Hgb urine dipstick: NEGATIVE
Ketones, ur: NEGATIVE mg/dL
Nitrite: NEGATIVE
Protein, ur: NEGATIVE mg/dL
Specific Gravity, Urine: 1.011 (ref 1.005–1.030)
pH: 6 (ref 5.0–8.0)

## 2024-08-18 LAB — CBC WITH DIFFERENTIAL/PLATELET
Abs Immature Granulocytes: 0.07 K/uL (ref 0.00–0.07)
Basophils Absolute: 0 K/uL (ref 0.0–0.1)
Basophils Relative: 0 %
Eosinophils Absolute: 0.1 K/uL (ref 0.0–0.5)
Eosinophils Relative: 1 %
HCT: 37.9 % (ref 36.0–46.0)
Hemoglobin: 12.6 g/dL (ref 12.0–15.0)
Immature Granulocytes: 1 %
Lymphocytes Relative: 9 %
Lymphs Abs: 1.2 K/uL (ref 0.7–4.0)
MCH: 29.3 pg (ref 26.0–34.0)
MCHC: 33.2 g/dL (ref 30.0–36.0)
MCV: 88.1 fL (ref 80.0–100.0)
Monocytes Absolute: 0.8 K/uL (ref 0.1–1.0)
Monocytes Relative: 6 %
Neutro Abs: 11.6 K/uL — ABNORMAL HIGH (ref 1.7–7.7)
Neutrophils Relative %: 83 %
Platelets: 219 K/uL (ref 150–400)
RBC: 4.3 MIL/uL (ref 3.87–5.11)
RDW: 13.1 % (ref 11.5–15.5)
WBC: 13.7 K/uL — ABNORMAL HIGH (ref 4.0–10.5)
nRBC: 0 % (ref 0.0–0.2)

## 2024-08-18 LAB — TROPONIN T, HIGH SENSITIVITY: Troponin T High Sensitivity: <15 ng/L (ref 0–19)

## 2024-08-18 LAB — COMPREHENSIVE METABOLIC PANEL WITH GFR
ALT: 26 U/L (ref 0–44)
AST: 31 U/L (ref 15–41)
Albumin: 4.6 g/dL (ref 3.5–5.0)
Alkaline Phosphatase: 82 U/L (ref 38–126)
Anion gap: 15 (ref 5–15)
BUN: 32 mg/dL — ABNORMAL HIGH (ref 8–23)
CO2: 24 mmol/L (ref 22–32)
Calcium: 9.8 mg/dL (ref 8.9–10.3)
Chloride: 99 mmol/L (ref 98–111)
Creatinine, Ser: 1.19 mg/dL — ABNORMAL HIGH (ref 0.44–1.00)
GFR, Estimated: 48 mL/min — ABNORMAL LOW (ref >60)
Glucose, Bld: 148 mg/dL — ABNORMAL HIGH (ref 70–99)
Potassium: 3.7 mmol/L (ref 3.5–5.1)
Sodium: 138 mmol/L (ref 135–145)
Total Bilirubin: 0.4 mg/dL (ref 0.0–1.2)
Total Protein: 7.4 g/dL (ref 6.5–8.1)

## 2024-08-18 LAB — LIPASE, BLOOD: Lipase: 51 U/L (ref 11–51)

## 2024-08-18 MED ORDER — ONDANSETRON HCL 4 MG/2ML IJ SOLN
4.0000 mg | Freq: Once | INTRAMUSCULAR | Status: AC
  Administered 2024-08-18: 4 mg via INTRAVENOUS
  Filled 2024-08-18: qty 2

## 2024-08-18 MED ORDER — SODIUM CHLORIDE 0.9 % IV BOLUS (SEPSIS)
1000.0000 mL | Freq: Once | INTRAVENOUS | Status: AC
  Administered 2024-08-18: 1000 mL via INTRAVENOUS

## 2024-08-18 MED ORDER — DICYCLOMINE HCL 20 MG PO TABS: 20.0000 mg | ORAL_TABLET | Freq: Three times a day (TID) | ORAL | 0 refills | Status: AC | PRN

## 2024-08-18 MED ORDER — ONDANSETRON 4 MG PO TBDP: 4.0000 mg | ORAL_TABLET | Freq: Four times a day (QID) | ORAL | 0 refills | Status: AC | PRN

## 2024-08-18 MED ORDER — IOHEXOL 300 MG/ML  SOLN
100.0000 mL | Freq: Once | INTRAMUSCULAR | Status: AC | PRN
  Administered 2024-08-18: 100 mL via INTRAVENOUS

## 2024-08-18 MED ORDER — FENTANYL CITRATE (PF) 50 MCG/ML IJ SOSY
50.0000 ug | PREFILLED_SYRINGE | Freq: Once | INTRAMUSCULAR | Status: AC
  Administered 2024-08-18: 50 ug via INTRAVENOUS
  Filled 2024-08-18: qty 1

## 2024-08-18 NOTE — Discharge Instructions (Addendum)
 You were evaluated for nausea, vomiting and diarrhea.  I suspect that you have a viral gastroenteritis.  You do not need antibiotics for a viral illness.  I recommend that you stick to a bland diet for the next 2 to 3 days (such as peaches, pears, applesauce, well cooked vegetables, mashed potatoes, oatmeal, bread, crackers, milk, cottage cheese, yogurt, mild salad dressings, eggs, low-fat pudding, gelatin, well cooked and tender meat).  Please increase your fluid intake (water , Powerade/Gatorade, Pedialyte, apple juice, ginger ale, Sprite).  You may take the nausea medicine that was prescribed to your pharmacy as needed.  You may take over-the-counter Imodium as needed for diarrhea.  You may alternate over the counter Tylenol  1000 mg every 6 hours as needed for fever and pain.  If you develop severe abdominal pain especially pain in the right lower abdomen, vomiting that does not stop despite medications at home, vomiting more than just streaks of blood, begin seeing large amounts of bright red blood in your stool or have black and tarry stools, unable to urinate for more than 8 hours, have dry or cracked lips, feel like you are going to pass out or you do pass out, please return to the emergency department.  Please note that medications such as Pepto-Bismol will turn your stool black (this is normal).

## 2024-08-18 NOTE — ED Notes (Signed)
Attempted to call pt's husband. No answer at this time.

## 2024-08-18 NOTE — ED Notes (Signed)
 Pt's husband arrived to pick her up. Pt and husband given DC instructions. Pt verbalized understanding of medications and follow up care. Pt ambulatory from ED with husband without difficulty at this time.

## 2024-08-18 NOTE — ED Provider Notes (Signed)
 [facility] Provider Note    Event Date/Time   First MD Initiated Contact with Patient 08/18/24 0250     (approximate)   History   Emesis   HPI  Elizabeth Malone is a 73 y.o. female with chronic kidney disease, hypertension, hyperlipidemia who presents to the emergency department complaints of feeling like there was fire all over my body tonight with lower abdominal pain, nausea and vomiting.  No diarrhea.  States that it was hard to get her urine out but no dysuria or hematuria.  She denies previous abdominal surgery.  States she was cold, clammy and diaphoretic.  She states that she does not have any chest pain or shortness of breath but has been dealing with shoulder discomfort recently and has a family history of coronary artery disease and was worried she could be having a heart attack.    Also reports has been dealing with vertigo for the past few weeks.  Took meclizine  for about 3 days but states I am not a medicine person.  No numbness, tingling, weakness, headache, head injury.  She states tonight she felt like she could not walk steady.  She has had vertigo before.   History provided by patient, husband, EMS.    Past Medical History:  Diagnosis Date   Anxiety    Arthritis    Bipolar disorder (HCC)    CKD (chronic kidney disease) stage 3, GFR 30-59 ml/min (HCC)    Complication of anesthesia    Depression    GERD (gastroesophageal reflux disease)    occasionally has to use tums   Headache    History of diverticulosis    Hyperlipidemia    Hypertension    Macular degeneration    Overactive bladder    PONV (postoperative nausea and vomiting)    PTSD (post-traumatic stress disorder)     Past Surgical History:  Procedure Laterality Date   COLONOSCOPY WITH PROPOFOL  N/A 10/07/2017   Procedure: COLONOSCOPY WITH PROPOFOL ;  Surgeon: Jinny Carmine, MD;  Location: [facility];  Service: Endoscopy;  Laterality: N/A;   COLONOSCOPY WITH  PROPOFOL  N/A 03/08/2023   Procedure: COLONOSCOPY WITH PROPOFOL ;  Surgeon: Jinny Carmine, MD;  Location: [facility];  Service: Endoscopy;  Laterality: N/A;   FOOT SURGERY Bilateral    KNEE SURGERY Left    POLYPECTOMY  10/07/2017   Procedure: POLYPECTOMY;  Surgeon: Jinny Carmine, MD;  Location: [facility];  Service: Endoscopy;;   POLYPECTOMY  03/08/2023   Procedure: POLYPECTOMY;  Surgeon: Jinny Carmine, MD;  Location: [facility];  Service: Endoscopy;;   TONSILLECTOMY AND ADENOIDECTOMY      MEDICATIONS:  Prior to Admission medications   Medication Sig Start Date End Date Taking? Authorizing Provider  ALPRAZolam (XANAX) 0.5 MG tablet Take 0.5 mg by mouth daily as needed. 03/15/24   [provider]  benazepril  (LOTENSIN ) 10 MG tablet Take 1 tablet (10 mg total) by mouth daily. 08/17/24   Cannady, Jolene T, NP  busPIRone  (BUSPAR ) 15 MG tablet Take 15 mg by mouth 3 (three) times daily. 05/01/24   [provider]  Calcium  Citrate-Vitamin D  250-5 MG-MCG TABS Take 1 tablet by mouth 2 (two) times daily.    [provider]  cetirizine  (ZYRTEC ) 10 MG tablet Take 1 tablet (10 mg total) by mouth daily. 06/21/24   Cannady, Jolene T, NP  fenofibrate  (TRICOR ) 145 MG tablet Take 1 tablet (145 mg total) by mouth daily. 09/29/23   Cannady, Jolene T, NP  lidocaine  (LIDODERM ) 5 % Place 1 patch onto the skin daily. Remove & Discard patch within 12 hours or as directed by MD 06/21/24   Cannady, Jolene T, NP  meclizine  (ANTIVERT ) 25 MG tablet Take 1 tablet (25 mg total) by mouth 3 (three) times daily as needed for dizziness. 07/05/24   Karamalegos, Marsa PARAS, DO  methocarbamol (ROBAXIN) 500 MG tablet Take 500 mg by mouth at bedtime as needed. 04/17/24   [provider]  metoprolol  succinate (TOPROL -XL) 25 MG 24 hr tablet Take 0.5 tablets (12.5 mg total) by mouth daily. 06/23/24   Cannady, Jolene T, NP  Multiple Vitamin (MULTIVITAMIN) tablet Take 1 tablet by mouth  daily.    [provider]  Omega-3 Fatty Acids (FISH OIL) 1000 MG CAPS Take 1,000 mg by mouth 2 (two) times daily.    [provider]  oxybutynin  (DITROPAN -XL) 10 MG 24 hr tablet Take 1 tablet (10 mg total) by mouth at bedtime. 07/05/24   Karamalegos, Marsa PARAS, DO  QUEtiapine  (SEROQUEL ) 300 MG tablet Take 1 tablet (300 mg total) by mouth at bedtime. Take total of 350 mg at night. Take along with 50 mg tab 08/25/23 07/05/24  Vickey Mettle, MD  QUEtiapine  (SEROQUEL ) 50 MG tablet Take 1 tablet (50 mg total) by mouth at bedtime. Take total of 350 mg at night. Take along with 300 mg at night 08/25/23 07/05/24  Hisada, Reina, MD  rosuvastatin  (CRESTOR ) 40 MG tablet Take 1 tablet (40 mg total) by mouth daily. 06/21/24   Cannady, Jolene T, NP  venlafaxine  XR (EFFEXOR -XR) 75 MG 24 hr capsule Take 3 capsules (225 mg total) by mouth daily. 08/26/23 07/05/24  Vickey Mettle, MD    Physical Exam   Triage Vital Signs: ED Triage Vitals  Encounter Vitals Group     BP 08/18/24 0245 (!) 180/105     Girls Systolic BP Percentile --      Girls Diastolic BP Percentile --      Boys Systolic BP Percentile --      Boys Diastolic BP Percentile --      Pulse Rate 08/18/24 0245 95     Resp 08/18/24 0245 20     Temp 08/18/24 0200 97.8 F (36.6 C)     Temp src --      SpO2 08/18/24 0245 100 %     Weight --      Height --      Head Circumference --      Peak Flow --      Pain Score --      Pain Loc --      Pain Education --      Exclude from Growth Chart --     Most recent vital signs: Vitals:   08/18/24 0245 08/18/24 0301  BP: (!) 180/105 (!) 155/103  Pulse: 95 94  Resp: 20   Temp:    SpO2: 100% 100%    CONSTITUTIONAL: Alert, responds appropriately to questions.  HEAD: Normocephalic, atraumatic EYES: Conjunctivae clear, pupils appear equal, sclera nonicteric ENT: normal nose; dry mucous membranes NECK: Supple, normal ROM CARD: RRR; S1 and S2 appreciated RESP: Normal chest  excursion without splinting or tachypnea; breath sounds clear and equal bilaterally; no wheezes, no rhonchi, no rales, no hypoxia or respiratory distress, speaking full sentences ABD/GI: Non-distended; soft, tender diffusely without guarding or rebound, no peritoneal signs, nonsurgical abdomen BACK: The back appears normal EXT: Normal ROM in all joints; no deformity noted, no edema, no calf tenderness or  calf swelling SKIN: Normal color for age and race; warm; no rash on exposed skin NEURO: Moves all extremities equally, normal speech, normal sensation, no pronator drift, cranial nerves II through XII intact PSYCH: The patient's mood and manner are appropriate.   ED Results / Procedures / Treatments   LABS: (all labs ordered are listed, but only abnormal results are displayed) Labs Reviewed  CBC WITH DIFFERENTIAL/PLATELET - Abnormal; Notable for the following components:      Result Value   WBC 13.7 (*)    Neutro Abs 11.6 (*)    All other components within normal limits  COMPREHENSIVE METABOLIC PANEL WITH GFR - Abnormal; Notable for the following components:   Glucose, Bld 148 (*)    BUN 32 (*)    Creatinine, Ser 1.19 (*)    GFR, Estimated 48 (*)    All other components within normal limits  URINALYSIS, ROUTINE W REFLEX MICROSCOPIC - Abnormal; Notable for the following components:   Color, Urine YELLOW (*)    APPearance CLEAR (*)    Leukocytes,Ua LARGE (*)    All other components within normal limits  LIPASE, BLOOD  TROPONIN T, HIGH SENSITIVITY     EKG:  EKG Interpretation Date/Time:  Friday August 18 2024 03:41:47 EST Ventricular Rate:  84 PR Interval:  140 QRS Duration:  89 QT Interval:  344 QTC Calculation: 407 R Axis:   28  Text Interpretation: Sinus rhythm Low voltage, precordial leads Confirmed by Neomi Neptune [phone]) on 08/18/2024 4:19:03 AM         RADIOLOGY: My personal review and interpretation of imaging: CT scan shows gastroenteritis.  I have  personally reviewed all radiology reports.   CT ABDOMEN PELVIS W CONTRAST Result Date: 08/18/2024 EXAM: CT ABDOMEN AND PELVIS WITH CONTRAST 08/18/2024 05:09:01 AM TECHNIQUE: CT of the abdomen and pelvis was performed with the administration of intravenous contrast. Multiplanar reformatted images are provided for review. Automated exposure control, iterative reconstruction, and/or weight-based adjustment of the mA/kV was utilized to reduce the radiation dose to as low as reasonably achievable. (iohexol  (OMNIPAQUE ) 300 MG/ML solution 100 mL IOHEXOL  300 MG/ML SOLN). COMPARISON: None available. CLINICAL HISTORY: 73 year old female. Abdominal pain, acute, nonlocalized. FINDINGS: LOWER CHEST: Normal heart size and negative lung bases. LIVER: Circumscribed 3.5 cm simple fluid density anterior right hepatic cyst appears benign on series 2 image 14 (no follow up imaging recommended). GALLBLADDER AND BILE DUCTS: Gallbladder is unremarkable. No biliary ductal dilatation. SPLEEN: No acute abnormality. PANCREAS: No acute abnormality. ADRENAL GLANDS: No acute abnormality. KIDNEYS, URETERS AND BLADDER: Small simple fluid density and benign right renal cysts (no follow up imaging recommended). Otherwise normal renal enhancement and contrast excretion. No stones in the kidneys or ureters. No hydronephrosis. No perinephric or periureteral stranding. Diminutive urinary bladder. GI AND BOWEL: Suspicious for acute enteritis / diarrhea, as evidenced by fluid-filled small bowel loops throughout the right abdomen, fluid in the cecum and ascending colon, and possible mucosal hyperenhancement in these segments and at the hepatic flexure of the large bowel. No transition point is identified. Redundant large bowel with abundant retained stool in the abdomen and pelvis. Normal gas containing appendix on coronal image 47. Decompressed stomach. No mesenteric inflammation. There is no convincing bowel obstruction. PERITONEUM AND  RETROPERITONEUM: No ascites. No free air. VASCULATURE: Moderate soft and calcified abdominal aortic atherosclerosis and iliac artery atherosclerosis are present. Major arterial structures and portal venous system appear patent. Aorta is normal in caliber. LYMPH NODES: No lymphadenopathy. REPRODUCTIVE ORGANS: No  abnormality. BONES AND SOFT TISSUES: Lumbar spine degeneration with levoconvex scoliosis. Chronic severe lumbar disc and left side facet degeneration. Chronic pubic symphysis degeneration. No acute osseous abnormality. No focal soft tissue abnormality. IMPRESSION: 1. . Fluid filled distal small bowel and proximal large bowel with questionable mucosal hyperenhancement, Suspicious for acute enteritis/diarrhea. 2. Superimposed redundant large bowel with abundant retained stool. Normal appendix. Electronically signed by: Helayne Hurst MD 08/18/2024 05:36 AM EST RP Workstation: [id number]     PROCEDURES:  Critical Care performed: No     .1-3 Lead EKG Interpretation  Performed by: Destanee Bedonie, Josette SAILOR, DO Authorized by: Nathanyel Defenbaugh, Josette SAILOR, DO     Interpretation: normal     ECG rate:  95   ECG rate assessment: normal     Rhythm: sinus rhythm     Ectopy: none     Conduction: normal       IMPRESSION / MDM / ASSESSMENT AND PLAN / ED COURSE  I reviewed the triage vital signs and the nursing notes.    Patient here with complaints of abdominal discomfort, bilateral shoulder pain, vertigo.  The patient is on the cardiac monitor to evaluate for evidence of arrhythmia and/or significant heart rate changes.   DIFFERENTIAL DIAGNOSIS (includes but not limited to):   Anemia, electrolyte derangement, UTI, colitis, diverticulitis, pancreatitis, cholecystitis, appendicitis, bowel obstruction, ileus, constipation, kidney stone, pyelonephritis, BPPV, subacute stroke   Patient's presentation is most consistent with acute presentation with potential threat to life or bodily function.   PLAN: Will obtain  labs, urine, EKG, CT abdomen pelvis.  Will give IV fluids, pain and nausea medicine.   MEDICATIONS GIVEN IN ED: Medications  sodium chloride  0.9 % bolus 1,000 mL (0 mLs Intravenous Stopped 08/18/24 0457)  fentaNYL  (SUBLIMAZE ) injection 50 mcg (50 mcg Intravenous Given 08/18/24 0320)  ondansetron  (ZOFRAN ) injection 4 mg (4 mg Intravenous Given 08/18/24 0320)  iohexol  (OMNIPAQUE ) 300 MG/ML solution 100 mL (100 mLs Intravenous Contrast Given 08/18/24 0459)     ED COURSE: Patient's labs shows leukocytosis of 13,000 with left shift.  Minimally elevated creatinine that is actually improved compared to most recent.  She does have history of chronic kidney disease.  Normal LFTs and lipase.  Troponin negative.  Urine shows no sign of infection and no ketones to suggest dehydration.  CT of the abdomen pelvis reviewed and interpreted by myself and the radiologist and shows viral gastroenteritis.  Patient feeling better here, tolerating p.o.  I feel she is safe to be discharged with prescriptions for Zofran , Bentyl , instructions for bland diet.   At this time, I do not feel there is any life-threatening condition present. I reviewed all nursing notes, vitals, pertinent previous records.  All lab and urine results, EKGs, imaging ordered have been independently reviewed and interpreted by myself.  I reviewed all available radiology reports from any imaging ordered this visit.  Based on my assessment, I feel the patient is safe to be discharged home without further emergent workup and can continue workup as an outpatient as needed. Discussed all findings, treatment plan as well as usual and customary return precautions.  They verbalize understanding and are comfortable with this plan.  Outpatient follow-up has been provided as needed.  All questions have been answered.    CONSULTS:  none   OUTSIDE RECORDS REVIEWED: Reviewed internal medicine note on 07/05/2024 where patient was seen for BPPV.       FINAL  CLINICAL IMPRESSION(S) / ED DIAGNOSES   Final diagnoses:  Nausea and vomiting in  adult     Rx / DC Orders   ED Discharge Orders          Ordered    ondansetron  (ZOFRAN -ODT) 4 MG disintegrating tablet  Every 6 hours PRN        08/18/24 0608    dicyclomine  (BENTYL ) 20 MG tablet  Every 8 hours PRN        08/18/24 0608             Note:  This document was prepared using Dragon voice recognition software and may include unintentional dictation errors.   Bryson Gavia, Josette SAILOR, DO 08/18/24 [phone]

## 2024-08-18 NOTE — ED Notes (Signed)
 Attempted to call pt's husband again at this time. Still no answer.

## 2024-08-18 NOTE — ED Triage Notes (Signed)
 Pt reports waking up at 1am tonight with abdominal pain/burning and emesis. Pt also reports she has been dealing with vertigo for the last several weeks and has had intermittent dizziness with movement. Pt was given 4mg  zofran  with EMS.

## 2024-12-20 ENCOUNTER — Ambulatory Visit: Admitting: Nurse Practitioner

## 2025-06-05 ENCOUNTER — Ambulatory Visit
